# Patient Record
Sex: Male | Born: 1985
Health system: Southern US, Community
[De-identification: ages and names within clinical notes are randomized; demographics above are authoritative.]

## PROBLEM LIST (undated history)

## (undated) DIAGNOSIS — S069XAA Unspecified intracranial injury with loss of consciousness status unknown, initial encounter: Secondary | ICD-10-CM

## (undated) DIAGNOSIS — G4700 Insomnia, unspecified: Secondary | ICD-10-CM

## (undated) DIAGNOSIS — F329 Major depressive disorder, single episode, unspecified: Secondary | ICD-10-CM

## (undated) DIAGNOSIS — K7030 Alcoholic cirrhosis of liver without ascites: Secondary | ICD-10-CM

## (undated) DIAGNOSIS — F32A Depression, unspecified: Secondary | ICD-10-CM

## (undated) DIAGNOSIS — F410 Panic disorder [episodic paroxysmal anxiety] without agoraphobia: Secondary | ICD-10-CM

## (undated) DIAGNOSIS — S069X9A Unspecified intracranial injury with loss of consciousness of unspecified duration, initial encounter: Secondary | ICD-10-CM

## (undated) HISTORY — PX: WRIST SURGERY: SHX841

## (undated) HISTORY — PX: ABDOMINAL SURGERY: SHX537

## (undated) HISTORY — PX: DISTAL FEMUR BONE BRIDGE EXCISION: SHX1465

## (undated) HISTORY — PX: FOREARM SURGERY: SHX651

---
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
## 2004-09-27 ENCOUNTER — Emergency Department (HOSPITAL_COMMUNITY): Admission: EM | Admit: 2004-09-27 | Discharge: 2004-09-27 | Payer: Self-pay | Admitting: Emergency Medicine

## 2004-10-17 ENCOUNTER — Emergency Department (HOSPITAL_COMMUNITY): Admission: EM | Admit: 2004-10-17 | Discharge: 2004-10-17 | Payer: Self-pay | Admitting: *Deleted

## 2014-04-03 ENCOUNTER — Encounter (HOSPITAL_COMMUNITY): Payer: Self-pay | Admitting: Emergency Medicine

## 2014-04-03 ENCOUNTER — Emergency Department (HOSPITAL_COMMUNITY)
Admission: EM | Admit: 2014-04-03 | Discharge: 2014-04-03 | Disposition: A | Discharge status: Home / Self Care | Payer: Self-pay | Attending: Emergency Medicine | Admitting: Emergency Medicine

## 2014-04-03 DIAGNOSIS — R05 Cough: Secondary | ICD-10-CM | POA: Insufficient documentation

## 2014-04-03 DIAGNOSIS — Z8672 Personal history of thrombophlebitis: Secondary | ICD-10-CM | POA: Insufficient documentation

## 2014-04-03 DIAGNOSIS — L089 Local infection of the skin and subcutaneous tissue, unspecified: Secondary | ICD-10-CM | POA: Insufficient documentation

## 2014-04-03 DIAGNOSIS — M79609 Pain in unspecified limb: Secondary | ICD-10-CM | POA: Insufficient documentation

## 2014-04-03 DIAGNOSIS — R6889 Other general symptoms and signs: Secondary | ICD-10-CM | POA: Insufficient documentation

## 2014-04-03 DIAGNOSIS — R059 Cough, unspecified: Secondary | ICD-10-CM | POA: Insufficient documentation

## 2014-04-03 DIAGNOSIS — IMO0001 Reserved for inherently not codable concepts without codable children: Secondary | ICD-10-CM | POA: Insufficient documentation

## 2014-04-03 DIAGNOSIS — Z8659 Personal history of other mental and behavioral disorders: Secondary | ICD-10-CM | POA: Insufficient documentation

## 2014-04-03 DIAGNOSIS — J3489 Other specified disorders of nose and nasal sinuses: Secondary | ICD-10-CM | POA: Insufficient documentation

## 2014-04-03 DIAGNOSIS — F17200 Nicotine dependence, unspecified, uncomplicated: Secondary | ICD-10-CM | POA: Insufficient documentation

## 2014-04-03 HISTORY — DX: Unspecified intracranial injury with loss of consciousness status unknown, initial encounter: S06.9XAA

## 2014-04-03 HISTORY — DX: Unspecified intracranial injury with loss of consciousness of unspecified duration, initial encounter: S06.9X9A

## 2014-04-03 HISTORY — DX: Insomnia, unspecified: G47.00

## 2014-04-03 HISTORY — DX: Depression, unspecified: F32.A

## 2014-04-03 HISTORY — DX: Panic disorder (episodic paroxysmal anxiety): F41.0

## 2014-04-03 HISTORY — DX: Major depressive disorder, single episode, unspecified: F32.9

## 2014-04-03 LAB — CBC WITH DIFFERENTIAL/PLATELET
Basophils Absolute: 0 10*3/uL (ref 0.0–0.1)
Basophils Relative: 1 % (ref 0–1)
Eosinophils Absolute: 0.2 10*3/uL (ref 0.0–0.7)
Eosinophils Relative: 3 % (ref 0–5)
HCT: 39.9 % (ref 39.0–52.0)
Hemoglobin: 14 g/dL (ref 13.0–17.0)
Lymphocytes Relative: 42 % (ref 12–46)
Lymphs Abs: 3.3 10*3/uL (ref 0.7–4.0)
MCH: 33.7 pg (ref 26.0–34.0)
MCHC: 35.1 g/dL (ref 30.0–36.0)
MCV: 95.9 fL (ref 78.0–100.0)
Monocytes Absolute: 0.4 10*3/uL (ref 0.1–1.0)
Monocytes Relative: 5 % (ref 3–12)
Neutro Abs: 4 10*3/uL (ref 1.7–7.7)
Neutrophils Relative %: 51 % (ref 43–77)
Platelets: 257 10*3/uL (ref 150–400)
RBC: 4.16 MIL/uL — ABNORMAL LOW (ref 4.22–5.81)
RDW: 13.1 % (ref 11.5–15.5)
WBC: 7.8 10*3/uL (ref 4.0–10.5)

## 2014-04-03 MED ORDER — HYDROCODONE-ACETAMINOPHEN 5-325 MG PO TABS: 1.0000 | ORAL_TABLET | ORAL | Status: DC | PRN

## 2014-04-03 MED ORDER — SULFAMETHOXAZOLE-TRIMETHOPRIM 800-160 MG PO TABS: 1.0000 | ORAL_TABLET | Freq: Two times a day (BID) | ORAL | Status: AC

## 2014-04-03 NOTE — ED Provider Notes (Signed)
CSN: [id number]     Arrival date & time 04/03/14  1653 History   First MD Initiated Contact with Patient 04/03/14 1704     Chief Complaint  Patient presents with  . Insect Bite     (Consider location/radiation/quality/duration/timing/severity/associated sxs/prior Treatment) Patient is a 28 y.o. male presenting with wound check. The history is provided by the patient.  Wound Check This is a chronic problem. The current episode started more than 1 month ago. The problem occurs constantly. The problem has been gradually worsening. Associated symptoms include congestion, coughing and myalgias. Pertinent negatives include no chills, fever, headaches, joint swelling, nausea, sore throat, swollen glands or vomiting. Abdominal pain: chronic. Nothing aggravates the symptoms.  Ante Carmon Sails Larkin is a 28 y.o. male who presents to the ED with areas on his right upper thigh that started 4 months ago. He states that he went to [facility] in [place] initially when he thought it was a spider bite. The wound was open and he had been trying OTC medication x a week before he went and it was just getting worse. Since the wound was open and they put a packing in and gave him Rx for Doxycycline for 5 days and it cost $70.00 and made him sick but he took it. The wound got some better but now is getting worse again and there are other places that are beginning to look similar to the first one. He denies fever or chills.   Past Medical History  Diagnosis Date  . Insomnia   . Panic attack   . Depression   . Brain injury    Past Surgical History  Procedure Laterality Date  . Forearm surgery Left     x4-metal rods placed  . Distal femur bone bridge excision Left   . Abdominal surgery    . Wrist surgery Right     pins   History reviewed. No pertinent family history. History  Substance Use Topics  . Smoking status: Current Every Day Smoker -- 0.50 packs/day for 14 years    Types: Cigarettes  . Smokeless tobacco:  Never Used  . Alcohol Use: 4.2 oz/week    7 Cans of beer per week    Review of Systems  Constitutional: Negative for fever and chills.  HENT: Positive for congestion and sneezing. Negative for sore throat.   Eyes: Negative for visual disturbance.  Respiratory: Positive for cough.   Gastrointestinal: Negative for nausea and vomiting. Abdominal pain: chronic.  Genitourinary: Negative for dysuria, urgency and frequency.  Musculoskeletal: Positive for myalgias. Negative for back pain and joint swelling.  Neurological: Negative for syncope and headaches.      Allergies  Review of patient's allergies indicates no known allergies.  Home Medications   Prior to Admission medications   Not on File   BP 127/83  Pulse 73  Temp(Src) 98.1 F (36.7 C) (Oral)  Resp 20  Ht 5\' 1"  (1.549 m)  Wt 110 lb (49.896 kg)  BMI 20.80 kg/m2  SpO2 100% Physical Exam  Nursing note and vitals reviewed. Constitutional: He is oriented to person, place, and time. He appears well-developed and well-nourished.  HENT:  Head: Normocephalic and atraumatic.  Eyes: Conjunctivae and EOM are normal.  Neck: Neck supple.  Cardiovascular: Normal rate and regular rhythm.   Pulmonary/Chest: Effort normal and breath sounds normal.  Musculoskeletal: Normal range of motion.       Right upper leg: He exhibits tenderness.       Legs: Open wounds, largest  3 cm x 2 cm.   Neurological: He is alert and oriented to person, place, and time. No cranial nerve deficit.  Skin: Skin is warm and dry.  Psychiatric: He has a normal mood and affect. His behavior is normal.    ED Course  Procedures  Dr. Jeraldine LootsLockwood in to examine the patient.  MDM  Will treat with antibiotics and pain medication. Stable for discharge without fever or signs of sepsis. Discussed with the patient clinical findings and plan of care. All questioned fully answered. He will return if any problems arise.    Medication List    TAKE these medications         HYDROcodone-acetaminophen 5-325 MG per tablet  Commonly known as:  NORCO/VICODIN  Take 1 tablet by mouth every 4 (four) hours as needed.     sulfamethoxazole-trimethoprim 800-160 MG per tablet  Commonly known as:  BACTRIM DS,SEPTRA DS  Take 1 tablet by mouth 2 (two) times daily.      ASK your doctor about these medications       ALEVE 220 MG tablet  Generic drug:  naproxen sodium  Take 220 mg by mouth daily as needed (for pain).     ALPRAZolam 1 MG tablet  Commonly known as:  XANAX  Take 1 mg by mouth 3 (three) times daily as needed for anxiety.     BC HEADACHE 325-95-16 MG Tabs  Generic drug:  Aspirin-Salicylamide-Caffeine  Take 1 packet by mouth daily as needed (for pain).     DULoxetine 60 MG capsule  Commonly known as:  CYMBALTA  Take 60 mg by mouth daily.     ibuprofen 200 MG tablet  Commonly known as:  ADVIL,MOTRIN  Take 200 mg by mouth every 6 (six) hours as needed.           [address]Hope [place] Neese, [place] 04/04/14 [phone]

## 2014-04-03 NOTE — ED Notes (Signed)
Patient treated for possible spider bite on right upper thigh x4 months ago at [facility]. Per patient never saw spider. Patient given antibiotics and wound packed. Now patient reports wound will not heal and now more areas on right upper leg are appearing.

## 2014-04-03 NOTE — ED Notes (Signed)
Pt presents with scab-like and abscess-like area to right anterior thigh. Pt states he was tx for possible spider bite 3-4 months ago in same area. Pt states area "never healed right". Pt reports pain.

## 2014-04-03 NOTE — Discharge Instructions (Signed)
Follow up with a primary care doctor to discuss your ongoing health. Return here for worsening symptoms.

## 2014-04-04 NOTE — ED Provider Notes (Signed)
Medical screening examination/treatment/procedure(s) were conducted as a shared visit with non-physician practitioner(s) and myself.  I personally evaluated the patient during the encounter. On my exam this patient was in no distress.  With concern for cutaneous MRSA infection, but with no e/o bacteremia / sepsis, the patient was started on ABX, d/c in stable condition.  Gerhard Munchobert Dequante Tremaine, MD 04/04/14 1515

## 2014-07-03 ENCOUNTER — Emergency Department (HOSPITAL_COMMUNITY): Payer: Self-pay

## 2014-07-03 ENCOUNTER — Encounter (HOSPITAL_COMMUNITY): Payer: Self-pay | Admitting: Emergency Medicine

## 2014-07-03 ENCOUNTER — Emergency Department (HOSPITAL_COMMUNITY)
Admission: EM | Admit: 2014-07-03 | Discharge: 2014-07-04 | Disposition: A | Discharge status: Home / Self Care | Payer: Self-pay | Attending: Emergency Medicine | Admitting: Emergency Medicine

## 2014-07-03 DIAGNOSIS — S6990XA Unspecified injury of unspecified wrist, hand and finger(s), initial encounter: Secondary | ICD-10-CM

## 2014-07-03 DIAGNOSIS — F329 Major depressive disorder, single episode, unspecified: Secondary | ICD-10-CM | POA: Insufficient documentation

## 2014-07-03 DIAGNOSIS — K047 Periapical abscess without sinus: Secondary | ICD-10-CM | POA: Insufficient documentation

## 2014-07-03 DIAGNOSIS — F17200 Nicotine dependence, unspecified, uncomplicated: Secondary | ICD-10-CM | POA: Insufficient documentation

## 2014-07-03 DIAGNOSIS — Z9889 Other specified postprocedural states: Secondary | ICD-10-CM | POA: Insufficient documentation

## 2014-07-03 DIAGNOSIS — Z87820 Personal history of traumatic brain injury: Secondary | ICD-10-CM | POA: Insufficient documentation

## 2014-07-03 DIAGNOSIS — F3289 Other specified depressive episodes: Secondary | ICD-10-CM | POA: Insufficient documentation

## 2014-07-03 DIAGNOSIS — F410 Panic disorder [episodic paroxysmal anxiety] without agoraphobia: Secondary | ICD-10-CM | POA: Insufficient documentation

## 2014-07-03 DIAGNOSIS — S0993XA Unspecified injury of face, initial encounter: Secondary | ICD-10-CM

## 2014-07-03 DIAGNOSIS — Z79899 Other long term (current) drug therapy: Secondary | ICD-10-CM | POA: Insufficient documentation

## 2014-07-03 DIAGNOSIS — Z791 Long term (current) use of non-steroidal anti-inflammatories (NSAID): Secondary | ICD-10-CM | POA: Insufficient documentation

## 2014-07-03 DIAGNOSIS — S59919A Unspecified injury of unspecified forearm, initial encounter: Secondary | ICD-10-CM

## 2014-07-03 DIAGNOSIS — S025XXA Fracture of tooth (traumatic), initial encounter for closed fracture: Secondary | ICD-10-CM | POA: Insufficient documentation

## 2014-07-03 DIAGNOSIS — S59909A Unspecified injury of unspecified elbow, initial encounter: Secondary | ICD-10-CM | POA: Insufficient documentation
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
## 2014-07-04 MED ORDER — HYDROCODONE-ACETAMINOPHEN 5-325 MG PO TABS: 1.0000 | ORAL_TABLET | ORAL | Status: DC | PRN

## 2014-07-04 MED ORDER — HYDROCODONE-ACETAMINOPHEN 5-325 MG PO TABS
1.0000 | ORAL_TABLET | Freq: Once | ORAL | Status: AC
  Administered 2014-07-04: 1 via ORAL
  Filled 2014-07-04: qty 1

## 2014-07-04 NOTE — Discharge Instructions (Signed)
Dental Injury Your exam shows that you have injured your teeth. The treatment of broken teeth and other dental injuries depends on how badly they are hurt. All dental injuries should be checked as soon as possible by a dentist if there are:  Loose teeth which may need to be wired or bonded with a plastic device to hold them in place.  Broken teeth with exposed tooth pulp which may cause a serious infection.  Painful teeth especially when you bite or chew.  Sharp tooth edges that cut your tongue or lips. Sometimes, antibiotics or pain medicine are prescribed to prevent infection and control pain. Eat a soft or liquid diet and rinse your mouth out after meals with warm water. You should see a dentist or return here at once if you have increased swelling, increased pain or uncontrolled bleeding from the site of your injury. SEEK MEDICAL CARE IF:   You have increased pain not controlled with medicines.  You have swelling around your tooth, in your face or neck.  You have bleeding which starts, continues, or gets worse.  You have a fever. Document Released: 12/03/2005 Document Revised: 02/25/2012 Document Reviewed: 12/02/2009 [facility] Patient Information 2015 [employer] [place]. This information is not intended to replace advice given to you by your health care provider. Make sure you discuss any questions you have with your health care provider.   You  may use the hydrocodone for pain relief but do not drive within 4 hours of taking as this will make you drowsy.  Avoid applying heat or ice to this abscess area which can worsen your symptoms.  You may use  half peroxide and water swish and spit after meals to keep your injured sites clean as discussed.  Call the dentist listed above for further management of your symptoms.

## 2014-07-04 NOTE — ED Provider Notes (Signed)
Medical screening examination/treatment/procedure(s) were performed by non-physician practitioner and as supervising physician I was immediately available for consultation/collaboration.   EKG Interpretation None        Gilda Creasehristopher J. Meranda Dechaine, MD 07/04/14 [phone]
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
## 2017-10-06 ENCOUNTER — Emergency Department (HOSPITAL_COMMUNITY)
Admission: EM | Admit: 2017-10-06 | Discharge: 2017-10-06 | Disposition: A | Discharge status: Home / Self Care | Payer: Self-pay | Attending: Emergency Medicine | Admitting: Emergency Medicine

## 2017-10-06 ENCOUNTER — Encounter (HOSPITAL_COMMUNITY): Payer: Self-pay | Admitting: Emergency Medicine

## 2017-10-06 DIAGNOSIS — F17210 Nicotine dependence, cigarettes, uncomplicated: Secondary | ICD-10-CM | POA: Insufficient documentation

## 2017-10-06 DIAGNOSIS — Z79899 Other long term (current) drug therapy: Secondary | ICD-10-CM | POA: Insufficient documentation

## 2017-10-06 DIAGNOSIS — K047 Periapical abscess without sinus: Secondary | ICD-10-CM | POA: Insufficient documentation

## 2017-10-06 MED ORDER — DICLOFENAC SODIUM 75 MG PO TBEC: 75.0000 mg | DELAYED_RELEASE_TABLET | Freq: Two times a day (BID) | ORAL | 0 refills | Status: DC

## 2017-10-06 MED ORDER — CLINDAMYCIN HCL 150 MG PO CAPS: 300.0000 mg | ORAL_CAPSULE | Freq: Four times a day (QID) | ORAL | 0 refills | Status: DC

## 2017-10-06 NOTE — Discharge Instructions (Signed)
Warm salt water rinses.  Call one of the dentists listed to arrange a follow-up appt

## 2017-10-06 NOTE — ED Triage Notes (Signed)
Patient c/o right lower dental pain with swelling. Per patient started 8 days ago. Patient taking ibuprofen with no relief, last dose yesterday. Patient states "he believes he had fever yesterday." Patient afebrile today.

## 2017-10-06 NOTE — ED Provider Notes (Signed)
[facility] EMERGENCY DEPARTMENT Provider Note   CSN: [id number] Arrival date & time: 10/06/17  1645     History   Chief Complaint Chief Complaint  Patient presents with  . Dental Pain    HPI Gregory Murphy is a 31 y.o. male.  HPI  Gregory Murphy is a 31 y.o. male who presents to the Emergency Department complaining of right sided dental pain for over one week.  He describes a throbbing pain to his lower teeth that is worse with cold liquids and chewing.  He has been taking ibuprofen without relief.  He noticed swelling to his right face one day ago.  Denies fever, neck pain, difficulty swallowing or breathing.     Past Medical History:  Diagnosis Date  . Brain injury (HCC)   . Depression   . Insomnia   . Panic attack     There are no active problems to display for this patient.   Past Surgical History:  Procedure Laterality Date  . ABDOMINAL SURGERY    . DISTAL FEMUR BONE BRIDGE EXCISION Left   . FOREARM SURGERY Left    x4-metal rods placed  . WRIST SURGERY Right    pins       Home Medications    Prior to Admission medications   Medication Sig Start Date End Date Taking? Authorizing Provider  ALPRAZolam Prudy Feeler(XANAX) 1 MG tablet Take 1 mg by mouth 3 (three) times daily as needed for anxiety.    [provider]  Aspirin-Salicylamide-Caffeine (BC HEADACHE) 325-95-16 MG TABS Take 1 packet by mouth daily as needed (for pain).    [provider]  DULoxetine (CYMBALTA) 60 MG capsule Take 60 mg by mouth daily.    [provider]  HYDROcodone-acetaminophen (NORCO/VICODIN) 5-325 MG per tablet Take 1 tablet by mouth every 4 (four) hours as needed. 04/03/14   Janne NapoleonNeese, Hope M, NP  HYDROcodone-acetaminophen (NORCO/VICODIN) 5-325 MG per tablet Take 1 tablet by mouth every 4 (four) hours as needed. 07/04/14   Burgess AmorIdol, Julie, PA-C  ibuprofen (ADVIL,MOTRIN) 200 MG tablet Take 200 mg by mouth every 6 (six) hours as needed.    [provider]    naproxen sodium (ALEVE) 220 MG tablet Take 220 mg by mouth daily as needed (for pain).    [provider]    Family History History reviewed. No pertinent family history.  Social History Social History  Substance Use Topics  . Smoking status: Current Every Day Smoker    Packs/day: 0.50    Years: 14.00    Types: Cigarettes  . Smokeless tobacco: Never Used  . Alcohol use 4.2 oz/week    7 Cans of beer per week     Allergies   Patient has no known allergies.   Review of Systems Review of Systems  Constitutional: Negative for appetite change and fever.  HENT: Positive for dental problem and facial swelling. Negative for congestion, sore throat and trouble swallowing.   Eyes: Negative for pain and visual disturbance.  Musculoskeletal: Negative for neck pain and neck stiffness.  Neurological: Negative for dizziness, facial asymmetry and headaches.  Hematological: Negative for adenopathy.  All other systems reviewed and are negative.    Physical Exam Updated Vital Signs BP 139/90 (BP Location: Left Arm)   Pulse 81   Temp 98.6 F (37 C) (Oral)   Resp 18   Ht 5\' 1"  (1.549 m)   Wt 49.9 kg (110 lb)   SpO2 98%   BMI 20.78 kg/m  Physical Exam  Constitutional: He is oriented to person, place, and time. He appears well-developed and well-nourished. No distress.  HENT:  Head: Normocephalic and atraumatic.  Right Ear: Tympanic membrane and ear canal normal.  Left Ear: Tympanic membrane and ear canal normal.  Mouth/Throat: Uvula is midline, oropharynx is clear and moist and mucous membranes are normal. No trismus in the jaw. Dental caries present. No dental abscesses or uvula swelling.  tenderness and multiple dental caries of the right lower premolars. Mild localized facial edema.  No fluctuance of the surrounding gingiva. No trismus, or sublingual abnml.    Neck: Normal range of motion. Neck supple.  Cardiovascular: Normal rate, regular rhythm and normal heart  sounds.   No murmur heard. Pulmonary/Chest: Effort normal and breath sounds normal.  Musculoskeletal: Normal range of motion.  Lymphadenopathy:    He has no cervical adenopathy.  Neurological: He is alert and oriented to person, place, and time. He exhibits normal muscle tone. Coordination normal.  Skin: Skin is warm and dry.  Nursing note and vitals reviewed.    ED Treatments / Results  Labs (all labs ordered are listed, but only abnormal results are displayed) Labs Reviewed - No data to display  EKG  EKG Interpretation None       Radiology No results found.  Procedures Procedures (including critical care time)  Medications Ordered in ED Medications - No data to display   Initial Impression / Assessment and Plan / ED Course  I have reviewed the triage vital signs and the nursing notes.  Pertinent labs & imaging results that were available during my care of the patient were reviewed by me and considered in my medical decision making (see chart for details).     Pt is well appeaaring.  Airway patent.  Minimal right lower facial edema without drainable abscess.  No concerning sx's for Ludwig's angina.  Final Clinical Impressions(s) / ED Diagnoses   Final diagnoses:  Dental abscess    New Prescriptions New Prescriptions   No medications on file     Rosey Bath 10/07/17 2125    Mesner, Barbara Cower, MD 10/07/17 2337

## 2019-02-14 ENCOUNTER — Emergency Department (HOSPITAL_COMMUNITY)
Admission: EM | Admit: 2019-02-14 | Discharge: 2019-02-15 | Disposition: A | Discharge status: Home / Self Care | Payer: Self-pay | Attending: Emergency Medicine | Admitting: Emergency Medicine

## 2019-02-14 ENCOUNTER — Emergency Department (HOSPITAL_COMMUNITY): Payer: Self-pay

## 2019-02-14 ENCOUNTER — Other Ambulatory Visit: Payer: Self-pay

## 2019-02-14 ENCOUNTER — Encounter (HOSPITAL_COMMUNITY): Payer: Self-pay | Admitting: Emergency Medicine

## 2019-02-14 DIAGNOSIS — Z79899 Other long term (current) drug therapy: Secondary | ICD-10-CM | POA: Insufficient documentation

## 2019-02-14 DIAGNOSIS — J209 Acute bronchitis, unspecified: Secondary | ICD-10-CM | POA: Insufficient documentation

## 2019-02-14 DIAGNOSIS — F17210 Nicotine dependence, cigarettes, uncomplicated: Secondary | ICD-10-CM | POA: Insufficient documentation

## 2019-02-14 MED ORDER — PREDNISONE 50 MG PO TABS
60.0000 mg | ORAL_TABLET | Freq: Once | ORAL | Status: AC
  Administered 2019-02-14: 60 mg via ORAL
  Filled 2019-02-14: qty 1

## 2019-02-14 MED ORDER — ALBUTEROL SULFATE (2.5 MG/3ML) 0.083% IN NEBU
2.5000 mg | INHALATION_SOLUTION | Freq: Once | RESPIRATORY_TRACT | Status: AC
  Administered 2019-02-14: 2.5 mg via RESPIRATORY_TRACT
  Filled 2019-02-14: qty 3

## 2019-02-14 MED ORDER — ALBUTEROL (5 MG/ML) CONTINUOUS INHALATION SOLN
10.0000 mg/h | INHALATION_SOLUTION | Freq: Once | RESPIRATORY_TRACT | Status: AC
  Administered 2019-02-14: 10 mg/h via RESPIRATORY_TRACT
  Filled 2019-02-14: qty 20

## 2019-02-14 MED ORDER — IPRATROPIUM-ALBUTEROL 0.5-2.5 (3) MG/3ML IN SOLN
3.0000 mL | Freq: Once | RESPIRATORY_TRACT | Status: AC
  Administered 2019-02-14: 3 mL via RESPIRATORY_TRACT
  Filled 2019-02-14: qty 3

## 2019-02-14 NOTE — ED Notes (Signed)
Respiratory made aware of neb.

## 2019-02-14 NOTE — ED Notes (Signed)
Patient transported to X-ray 

## 2019-02-14 NOTE — ED Triage Notes (Signed)
Pt c/o cough, sore throat, chills, nasal and chest congestion x 1 week

## 2019-02-14 NOTE — ED Notes (Signed)
Respiratory notified for ned treatment.

## 2019-02-14 NOTE — ED Provider Notes (Signed)
[facility] EMERGENCY DEPARTMENT Provider Note   CSN: [id number] Arrival date & time: 02/14/19  2036    History   Chief Complaint Chief Complaint  Patient presents with  . Cough    HPI Gregory Murphy is a 33 y.o. male presenting with a one week history of cold like symptoms, first described as nasal congestion and rhinorrhea which has progressed to cough productive of thick green sputum production, shortness of breath and wheezing.  He denies fevers or chills but does endorse episodic increased sweating.  He has had no treatments prior to arrival.  He is a smoker but reports has smoked very little this week.  He denies hemoptysis, weight loss, n/v, abdominal pain but does feel sore from all the coughing.  He has found no alleviators for his symptoms.     The history is provided by the patient.    Past Medical History:  Diagnosis Date  . Brain injury (HCC)   . Depression   . Insomnia   . Panic attack     There are no active problems to display for this patient.   Past Surgical History:  Procedure Laterality Date  . ABDOMINAL SURGERY    . DISTAL FEMUR BONE BRIDGE EXCISION Left   . FOREARM SURGERY Left    x4-metal rods placed  . WRIST SURGERY Right    pins        Home Medications    Prior to Admission medications   Medication Sig Start Date End Date Taking? Authorizing Provider  acetaminophen (TYLENOL) 500 MG tablet Take 500 mg by mouth every 6 (six) hours as needed for mild pain or moderate pain.   Yes [provider]  albuterol (PROVENTIL) (2.5 MG/3ML) 0.083% nebulizer solution Take 2.5 mg by nebulization every 6 (six) hours as needed for wheezing or shortness of breath.   Yes [provider]  benzonatate (TESSALON) 100 MG capsule Take 2 capsules (200 mg total) by mouth 3 (three) times daily as needed. 02/15/19   Burgess Amor, PA-C  predniSONE (DELTASONE) 50 MG tablet Take one tablet daily for 5 days 02/15/19   Burgess Amor, PA-C    Family  History History reviewed. No pertinent family history.  Social History Social History   Tobacco Use  . Smoking status: Current Every Day Smoker    Packs/day: 0.50    Years: 14.00    Pack years: 7.00    Types: Cigarettes  . Smokeless tobacco: Never Used  Substance Use Topics  . Alcohol use: Yes    Alcohol/week: 7.0 standard drinks    Types: 7 Cans of beer per week  . Drug use: No     Allergies   Patient has no known allergies.   Review of Systems Review of Systems  Constitutional: Positive for diaphoresis. Negative for chills and fever.  HENT: Positive for congestion and rhinorrhea. Negative for sore throat.   Eyes: Negative.   Respiratory: Positive for chest tightness, shortness of breath and wheezing.   Cardiovascular: Negative for chest pain.  Gastrointestinal: Negative for abdominal pain, nausea and vomiting.  Genitourinary: Negative.   Musculoskeletal: Negative for arthralgias, joint swelling and neck pain.  Skin: Negative.  Negative for rash and wound.  Neurological: Negative for dizziness, weakness, light-headedness, numbness and headaches.  Psychiatric/Behavioral: Negative.      Physical Exam Updated Vital Signs BP 134/69   Pulse 84   Temp 97.8 F (36.6 C) (Oral)   Resp 18   Ht 5\' 1"  (1.549 m)  Wt 49.9 kg   SpO2 99%   BMI 20.78 kg/m   Physical Exam Vitals signs and nursing note reviewed.  Constitutional:      Appearance: He is well-developed.  HENT:     Head: Normocephalic and atraumatic.     Nose: Congestion and rhinorrhea present.  Eyes:     Conjunctiva/sclera: Conjunctivae normal.  Neck:     Musculoskeletal: Normal range of motion.  Cardiovascular:     Rate and Rhythm: Regular rhythm. Tachycardia present.     Heart sounds: Normal heart sounds.  Pulmonary:     Effort: Respiratory distress present.     Breath sounds: Decreased air movement present. Decreased breath sounds and wheezing present. No rhonchi or rales.     Comments: Prolonged  expirations Abdominal:     General: Bowel sounds are normal.     Palpations: Abdomen is soft.     Tenderness: There is no abdominal tenderness.  Musculoskeletal: Normal range of motion.  Skin:    General: Skin is warm and dry.  Neurological:     Mental Status: He is alert.      ED Treatments / Results  Labs (all labs ordered are listed, but only abnormal results are displayed) Labs Reviewed - No data to display  EKG None  Radiology Dg Chest 2 View  Result Date: 02/14/2019 CLINICAL DATA:  Cough, SOB and sore throat. EXAM: CHEST - 2 VIEW COMPARISON:  None. FINDINGS: Heart size appears within normal limits. No pleural effusion or edema identified. Diffuse bronchial wall thickening identified bilaterally. No airspace consolidation. IMPRESSION: 1. Bronchitic changes identified bilaterally.  No pneumonia Electronically Signed   By: Signa Kell M.D.   On: 02/14/2019 21:43    Procedures Procedures (including critical care time)  Medications Ordered in ED Medications  albuterol (PROVENTIL HFA;VENTOLIN HFA) 108 (90 Base) MCG/ACT inhaler 2 puff (2 puffs Inhalation Given 02/15/19 0015)  predniSONE (DELTASONE) tablet 60 mg (60 mg Oral Given 02/14/19 2117)  ipratropium-albuterol (DUONEB) 0.5-2.5 (3) MG/3ML nebulizer solution 3 mL (3 mLs Nebulization Given 02/14/19 2135)  albuterol (PROVENTIL) (2.5 MG/3ML) 0.083% nebulizer solution 2.5 mg (2.5 mg Nebulization Given 02/14/19 2135)  albuterol (PROVENTIL,VENTOLIN) solution continuous neb (10 mg/hr Nebulization Given 02/14/19 2256)  benzonatate (TESSALON) capsule 200 mg (200 mg Oral Given 02/15/19 0015)     Initial Impression / Assessment and Plan / ED Course  I have reviewed the triage vital signs and the nursing notes.  Pertinent labs & imaging results that were available during my care of the patient were reviewed by me and considered in my medical decision making (see chart for details).        Patient with acute bronchitis without  pneumonia.  He was given albuterol and Atrovent neb treatment in addition to p.o. prednisone.  He continued to have significant expiratory wheeze and shortness of breath.  He was then given an hour-long nebulizer treatment after which time his wheezing had greatly improved along with increased aeration.  He felt much more comfortable with his breathing.  He was given an albuterol MDI for home use.  Also pulse dosing of prednisone, Tessalon prescribed for his cough.  Discussed smoking cessation and information regarding smoking complications was given.  PRN follow-up anticipated.  Return precautions were outlined.  Final Clinical Impressions(s) / ED Diagnoses   Final diagnoses:  Acute bronchitis, unspecified organism    ED Discharge Orders         Ordered    predniSONE (DELTASONE) 50 MG tablet  02/15/19 0006    benzonatate (TESSALON) 100 MG capsule  3 times daily PRN     02/15/19 0008           Burgess Amor, PA-C 02/15/19 0130    Loren Racer, MD 02/15/19 [phone]

## 2019-02-15 MED ORDER — BENZONATATE 100 MG PO CAPS: 200.0000 mg | ORAL_CAPSULE | Freq: Three times a day (TID) | ORAL | 0 refills | Status: DC | PRN

## 2019-02-15 MED ORDER — PREDNISONE 50 MG PO TABS: ORAL_TABLET | ORAL | 0 refills | Status: DC

## 2019-02-15 MED ORDER — ALBUTEROL SULFATE HFA 108 (90 BASE) MCG/ACT IN AERS
2.0000 | INHALATION_SPRAY | RESPIRATORY_TRACT | Status: DC
  Administered 2019-02-15: 2 via RESPIRATORY_TRACT
  Filled 2019-02-15: qty 6.7

## 2019-02-15 MED ORDER — BENZONATATE 100 MG PO CAPS
200.0000 mg | ORAL_CAPSULE | Freq: Once | ORAL | Status: AC
  Administered 2019-02-15: 200 mg via ORAL
  Filled 2019-02-15: qty 2

## 2019-02-15 NOTE — Discharge Instructions (Signed)
Use the inhaler given taking 2 puffs every 4 hours if you are coughing or wheezing.  Take your next dose of prednisone Sunday evening and every day for 5 days until gone.  Get rechecked for any worsened symptoms. Consider smoking cessation.

## 2019-06-18 ENCOUNTER — Other Ambulatory Visit: Payer: Self-pay

## 2019-06-18 DIAGNOSIS — Z20822 Contact with and (suspected) exposure to covid-19: Secondary | ICD-10-CM

## 2019-06-24 LAB — NOVEL CORONAVIRUS, NAA: SARS-CoV-2, NAA: NOT DETECTED

## 2019-08-06 ENCOUNTER — Encounter (HOSPITAL_COMMUNITY): Payer: Self-pay | Admitting: Emergency Medicine

## 2019-08-06 ENCOUNTER — Emergency Department (HOSPITAL_COMMUNITY)
Admission: EM | Admit: 2019-08-06 | Discharge: 2019-08-06 | Disposition: A | Discharge status: Home / Self Care | Payer: Self-pay | Attending: Emergency Medicine | Admitting: Emergency Medicine

## 2019-08-06 ENCOUNTER — Other Ambulatory Visit: Payer: Self-pay

## 2019-08-06 ENCOUNTER — Emergency Department (HOSPITAL_COMMUNITY): Payer: Self-pay

## 2019-08-06 DIAGNOSIS — J40 Bronchitis, not specified as acute or chronic: Secondary | ICD-10-CM | POA: Insufficient documentation

## 2019-08-06 DIAGNOSIS — Z0279 Encounter for issue of other medical certificate: Secondary | ICD-10-CM | POA: Insufficient documentation

## 2019-08-06 DIAGNOSIS — T675XXA Heat exhaustion, unspecified, initial encounter: Secondary | ICD-10-CM | POA: Insufficient documentation

## 2019-08-06 DIAGNOSIS — F17210 Nicotine dependence, cigarettes, uncomplicated: Secondary | ICD-10-CM | POA: Insufficient documentation

## 2019-08-06 LAB — BASIC METABOLIC PANEL
Anion gap: 11 (ref 5–15)
BUN: 9 mg/dL (ref 6–20)
CO2: 26 mmol/L (ref 22–32)
Calcium: 9.1 mg/dL (ref 8.9–10.3)
Chloride: 99 mmol/L (ref 98–111)
Creatinine, Ser: 0.63 mg/dL (ref 0.61–1.24)
GFR calc Af Amer: >60 mL/min (ref >60)
GFR calc non Af Amer: >60 mL/min (ref >60)
Glucose, Bld: 86 mg/dL (ref 70–99)
Potassium: 4.2 mmol/L (ref 3.5–5.1)
Sodium: 136 mmol/L (ref 135–145)

## 2019-08-06 MED ORDER — ALBUTEROL SULFATE HFA 108 (90 BASE) MCG/ACT IN AERS: 2.0000 | INHALATION_SPRAY | Freq: Once | RESPIRATORY_TRACT | Status: DC

## 2019-08-06 NOTE — Discharge Instructions (Addendum)
Your vital signs are within normal limits.  Your oxygen level is 98% on room air.  Your chemistries did not show any signs of dehydration or renal injury.  Your chest x-ray is negative for pneumonia or other acute problems.  Please continue to increase fluids.  Please use caution when in extreme heat not to be overheated.  See your primary physician or return to the emergency department if any changes in your condition, problems or concerns.

## 2019-08-06 NOTE — ED Provider Notes (Signed)
[facility] EMERGENCY DEPARTMENT Provider Note   CSN: [id number] Arrival date & time: 08/06/19  1300     History   Chief Complaint Chief Complaint  Patient presents with  . Needs work note    HPI Gregory Murphy is a 33 y.o. male.     Patient is a 33 year old male who presents to the emergency department for recheck, and to obtain a work note.  The patient states that on yesterday August 19, he got overheated, he started having some shaking, and then he had a vomiting episode.  He says that he drank a lot of liquids and he thinks that drinking liquids on a hot stomach may have been what made him sick.  He says that his work is requiring him to have a work note before he can return to work.  The patient states that he is already had COVID testing and was found to be negative for COVID.  No high fever reported.  No unusual headache, no difficulty with taste or smell, no recent exposure to someone known to have the COVID-19 virus.  The history is provided by the patient.    Past Medical History:  Diagnosis Date  . Brain injury ([place])   . Depression   . Insomnia   . Panic attack     There are no active problems to display for this patient.   Past Surgical History:  Procedure Laterality Date  . ABDOMINAL SURGERY    . DISTAL FEMUR BONE BRIDGE EXCISION Left   . FOREARM SURGERY Left    x4-metal rods placed  . WRIST SURGERY Right    pins        Home Medications    Prior to Admission medications   Medication Sig Start Date End Date Taking? Authorizing Provider  acetaminophen (TYLENOL) 500 MG tablet Take 500 mg by mouth every 6 (six) hours as needed for mild pain or moderate pain.    [provider]  albuterol (PROVENTIL) (2.5 MG/3ML) 0.083% nebulizer solution Take 2.5 mg by nebulization every 6 (six) hours as needed for wheezing or shortness of breath.    [provider]  benzonatate (TESSALON) 100 MG capsule Take 2 capsules (200 mg total) by mouth 3  (three) times daily as needed. 02/15/19   [name], PA-C  predniSONE (DELTASONE) 50 MG tablet Take one tablet daily for 5 days 02/15/19   [name], PA-C    Family History History reviewed. No pertinent family history.  Social History Social History   Tobacco Use  . Smoking status: Current Every Day Smoker    Packs/day: 0.50    Years: 14.00    Pack years: 7.00    Types: Cigarettes  . Smokeless tobacco: Never Used  Substance Use Topics  . Alcohol use: Yes    Alcohol/week: 7.0 standard drinks    Types: 7 Cans of beer per week  . Drug use: No     Allergies   Patient has no known allergies.   Review of Systems Review of Systems  Constitutional: Negative for activity change and appetite change.  HENT: Negative for congestion, ear discharge, ear pain, facial swelling, nosebleeds, rhinorrhea, sneezing and tinnitus.   Eyes: Negative for photophobia, pain and discharge.  Respiratory: Positive for cough and wheezing. Negative for choking and shortness of breath.   Cardiovascular: Negative for chest pain, palpitations and leg swelling.  Gastrointestinal: Positive for nausea and vomiting. Negative for abdominal pain, blood in stool, constipation and diarrhea.  Genitourinary: Negative for  difficulty urinating, dysuria, flank pain, frequency and hematuria.  Musculoskeletal: Negative for back pain, gait problem, myalgias and neck pain.  Skin: Negative for color change, rash and wound.  Neurological: Negative for dizziness, seizures, syncope, facial asymmetry, speech difficulty, weakness and numbness.  Hematological: Negative for adenopathy. Does not bruise/bleed easily.  Psychiatric/Behavioral: Negative for agitation, confusion, hallucinations, self-injury and suicidal ideas. The patient is not nervous/anxious.      Physical Exam Updated Vital Signs BP 127/82 (BP Location: Right Arm)   Pulse 60   Temp 98.4 F (36.9 C) (Oral)   Resp 16   Ht 5\' 1"  (1.549 m)   Wt 49.9 kg   SpO2  98%   BMI 20.78 kg/m   Physical Exam Vitals signs and nursing note reviewed.  Constitutional:      Appearance: He is well-developed. He is not toxic-appearing.  HENT:     Head: Normocephalic.     Right Ear: Tympanic membrane and external ear normal.     Left Ear: Tympanic membrane and external ear normal.  Eyes:     General: Lids are normal.     Pupils: Pupils are equal, round, and reactive to light.  Neck:     Musculoskeletal: Normal range of motion and neck supple.     Vascular: No carotid bruit.  Cardiovascular:     Rate and Rhythm: Normal rate and regular rhythm.     Pulses: Normal pulses.     Heart sounds: Normal heart sounds.  Pulmonary:     Effort: No respiratory distress.     Breath sounds: Wheezing and rhonchi present.  Abdominal:     General: Bowel sounds are normal.     Palpations: Abdomen is soft.     Tenderness: There is no abdominal tenderness. There is no guarding.  Musculoskeletal: Normal range of motion.  Lymphadenopathy:     Head:     Right side of head: No submandibular adenopathy.     Left side of head: No submandibular adenopathy.     Cervical: No cervical adenopathy.  Skin:    General: Skin is warm and dry.  Neurological:     Mental Status: He is alert and oriented to person, place, and time.     Cranial Nerves: No cranial nerve deficit.     Sensory: No sensory deficit.  Psychiatric:        Speech: Speech normal.      ED Treatments / Results  Labs (all labs ordered are listed, but only abnormal results are displayed) Labs Reviewed  BASIC METABOLIC PANEL    EKG None  Radiology No results found.  Procedures Procedures (including critical care time)  Medications Ordered in ED Medications - No data to display   Initial Impression / Assessment and Plan / ED Course  I have reviewed the triage vital signs and the nursing notes.  Pertinent labs & imaging results that were available during my care of the patient were reviewed by me  and considered in my medical decision making (see chart for details).          Final Clinical Impressions(s) / ED Diagnoses MDM  Vital signs are within normal limits.  Pulse oximetry is 98% on room air.  Within normal limits by my interpretation.  Patient had a negative COVID-19 test about 10 days ago.  Patient had wheezes and a few scattered rhonchi on the examination.  Chest x-ray obtained.  No acute findings noted.  Patient will be treated with albuterol 2 puffs every 4  hours.  The basic metabolic panel is negative for any acute changes.  The patient is drinking liquids and able to keep them down without any problem.  Work note given for the patient to return to work on tomorrow, August 21.   Final diagnoses:  Heat exhaustion, initial encounter  Bronchitis    ED Discharge Orders    None       Ivery QualeBryant, Margi Edmundson, PA-C 08/06/19 1516    Gerhard MunchLockwood, Robert, MD 08/07/19 [phone]

## 2019-08-06 NOTE — ED Notes (Signed)
Pt refused D/C vitals and inhaler.

## 2019-08-06 NOTE — ED Triage Notes (Signed)
Patient states he needs a note to return to work. Got overheated yesterday and vomited. Has to have note to return to work today. Has negative COVID test from 2 weeks ago.

## 2019-11-01 ENCOUNTER — Other Ambulatory Visit: Payer: Self-pay

## 2019-11-01 ENCOUNTER — Emergency Department (HOSPITAL_COMMUNITY)
Admission: EM | Admit: 2019-11-01 | Discharge: 2019-11-01 | Disposition: A | Discharge status: Home / Self Care | Payer: Self-pay | Attending: Emergency Medicine | Admitting: Emergency Medicine

## 2019-11-01 ENCOUNTER — Encounter (HOSPITAL_COMMUNITY): Payer: Self-pay | Admitting: *Deleted

## 2019-11-01 DIAGNOSIS — Z20828 Contact with and (suspected) exposure to other viral communicable diseases: Secondary | ICD-10-CM | POA: Insufficient documentation

## 2019-11-01 DIAGNOSIS — F17210 Nicotine dependence, cigarettes, uncomplicated: Secondary | ICD-10-CM | POA: Insufficient documentation

## 2019-11-01 DIAGNOSIS — Z20822 Contact with and (suspected) exposure to covid-19: Secondary | ICD-10-CM

## 2019-11-01 LAB — SARS CORONAVIRUS 2 (TAT 6-24 HRS): SARS Coronavirus 2: NEGATIVE

## 2019-11-01 NOTE — ED Notes (Signed)
Pt appears impaired admitting to ingestion of ETOH beverages today   He request a return to work note from his Covid Dx

## 2019-11-01 NOTE — Discharge Instructions (Addendum)
You were tested for COVID, return to work depends on multiple factores.  If you are asymptomatic- we typically recommend quarantine for 14 days from time of exposure: Exposure is defined as Individual who has had close contact (within 6 ff for a total of 15 min or more) with someone who is covid positive. Persons with COVID-19 who have symptoms are instructed to quarantine, can discontinue if:- It has been at least 7 days have passed since symptoms 1st appeared.AND at least 3 days (72 hours) have passed since recovery defined as resolution of fever without the use of fever-reducing medications and improvement in respiratory symptoms (e.g., cough, shortness of breath)  Your return to work date depends on when you were exposed, the results of your test, and symptoms. Please see attached CDC guidelines.   Please follow the below quarantine instructions.   Return to the ER for new or worsening symptoms including but not limited to increased work of breathing, fever, chest pain, passing out, or any other concerns.       Person Under Monitoring Name: Gregory Murphy  Location: [address] [place] [address]   Infection Prevention Recommendations for Individuals Confirmed to have, or Being Evaluated for, 2019 Novel Coronavirus (COVID-19) Infection Who Receive Care at Home  Individuals who are confirmed to have, or are being evaluated for, COVID-19 should follow the prevention steps below until a healthcare provider or local or state health department says they can return to normal activities.  Stay home except to get medical care You should restrict activities outside your home, except for getting medical care. Do not go to work, school, or public areas, and do not use public transportation or taxis.  Call ahead before visiting your doctor Before your medical appointment, call the healthcare provider and tell them that you have, or are being evaluated for, COVID-19 infection. This will  help the healthcare providers office take steps to keep other people from getting infected. Ask your healthcare provider to call the local or state health department.  Monitor your symptoms Seek prompt medical attention if your illness is worsening (e.g., difficulty breathing). Before going to your medical appointment, call the healthcare provider and tell them that you have, or are being evaluated for, COVID-19 infection. Ask your healthcare provider to call the local or state health department.  Wear a facemask You should wear a facemask that covers your nose and mouth when you are in the same room with other people and when you visit a healthcare provider. People who live with or visit you should also wear a facemask while they are in the same room with you.  Separate yourself from other people in your home As much as possible, you should stay in a different room from other people in your home. Also, you should use a separate bathroom, if available.  Avoid sharing household items You should not share dishes, drinking glasses, cups, eating utensils, towels, bedding, or other items with other people in your home. After using these items, you should wash them thoroughly with soap and water.  Cover your coughs and sneezes Cover your mouth and nose with a tissue when you cough or sneeze, or you can cough or sneeze into your sleeve. Throw used tissues in a lined trash can, and immediately wash your hands with soap and water for at least 20 seconds or use an alcohol-based hand rub.  Wash your [employer] your hands often and thoroughly with soap and water for at least 20  seconds. You can use an alcohol-based hand sanitizer if soap and water are not available and if your hands are not visibly dirty. Avoid touching your eyes, nose, and mouth with unwashed hands.   Prevention Steps for Caregivers and Household Members of Individuals Confirmed to have, or Being Evaluated for, COVID-19  Infection Being Cared for in the Home  If you live with, or provide care at home for, a person confirmed to have, or being evaluated for, COVID-19 infection please follow these guidelines to prevent infection:  Follow healthcare providers instructions Make sure that you understand and can help the patient follow any healthcare provider instructions for all care.  Provide for the patients basic needs You should help the patient with basic needs in the home and provide support for getting groceries, prescriptions, and other personal needs.  Monitor the patients symptoms If they are getting sicker, call his or her medical provider and tell them that the patient has, or is being evaluated for, COVID-19 infection. This will help the healthcare providers office take steps to keep other people from getting infected. Ask the healthcare provider to call the local or state health department.  Limit the number of people who have contact with the patient If possible, have only one caregiver for the patient. Other household members should stay in another home or place of residence. If this is not possible, they should stay in another room, or be separated from the patient as much as possible. Use a separate bathroom, if available. Restrict visitors who do not have an essential need to be in the home.  Keep older adults, very young children, and other sick people away from the patient Keep older adults, very young children, and those who have compromised immune systems or chronic health conditions away from the patient. This includes people with chronic heart, lung, or kidney conditions, diabetes, and cancer.  Ensure good ventilation Make sure that shared spaces in the home have good air flow, such as from an air conditioner or an opened window, weather permitting.  Wash your hands often Wash your hands often and thoroughly with soap and water for at least 20 seconds. You can use an alcohol  based hand sanitizer if soap and water are not available and if your hands are not visibly dirty. Avoid touching your eyes, nose, and mouth with unwashed hands. Use disposable paper towels to dry your hands. If not available, use dedicated cloth towels and replace them when they become wet.  Wear a facemask and gloves Wear a disposable facemask at all times in the room and gloves when you touch or have contact with the patients blood, body fluids, and/or secretions or excretions, such as sweat, saliva, sputum, nasal mucus, vomit, urine, or feces.  Ensure the mask fits over your nose and mouth tightly, and do not touch it during use. Throw out disposable facemasks and gloves after using them. Do not reuse. Wash your hands immediately after removing your facemask and gloves. If your personal clothing becomes contaminated, carefully remove clothing and launder. Wash your hands after handling contaminated clothing. Place all used disposable facemasks, gloves, and other waste in a lined container before disposing them with other household waste. Remove gloves and wash your hands immediately after handling these items.  Do not share dishes, glasses, or other household items with the patient Avoid sharing household items. You should not share dishes, drinking glasses, cups, eating utensils, towels, bedding, or other items with a patient who is confirmed to have,  or being evaluated for, COVID-19 infection. After the person uses these items, you should wash them thoroughly with soap and water.  Wash laundry thoroughly Immediately remove and wash clothes or bedding that have blood, body fluids, and/or secretions or excretions, such as sweat, saliva, sputum, nasal mucus, vomit, urine, or feces, on them. Wear gloves when handling laundry from the patient. Read and follow directions on labels of laundry or clothing items and detergent. In general, wash and dry with the warmest temperatures recommended on the  label.  Clean all areas the individual has used often Clean all touchable surfaces, such as counters, tabletops, doorknobs, bathroom fixtures, toilets, phones, keyboards, tablets, and bedside tables, every day. Also, clean any surfaces that may have blood, body fluids, and/or secretions or excretions on them. Wear gloves when cleaning surfaces the patient has come in contact with. Use a diluted bleach solution (e.g., dilute bleach with 1 part bleach and 10 parts water) or a household disinfectant with a label that says EPA-registered for coronaviruses. To make a bleach solution at home, add 1 tablespoon of bleach to 1 quart (4 cups) of water. For a larger supply, add  cup of bleach to 1 gallon (16 cups) of water. Read labels of cleaning products and follow recommendations provided on product labels. Labels contain instructions for safe and effective use of the cleaning product including precautions you should take when applying the product, such as wearing gloves or eye protection and making sure you have good ventilation during use of the product. Remove gloves and wash hands immediately after cleaning.  Monitor yourself for signs and symptoms of illness Caregivers and household members are considered close contacts, should monitor their health, and will be asked to limit movement outside of the home to the extent possible. Follow the monitoring steps for close contacts listed on the symptom monitoring form.   ? If you have additional questions, contact your local health department or call the epidemiologist on call at [phone] (available 24/7). ? This guidance is subject to change. For the most up-to-date guidance from [facility], please refer to their website: [email or web address]

## 2019-11-01 NOTE — ED Triage Notes (Signed)
Pt with positive covid test that was required by work per pt a week ago.  Pt denies any symptoms and needs a work note to be able to return to work.  Denies fever or cough.

## 2019-11-01 NOTE — ED Provider Notes (Signed)
[facility]   CSN: [id number] Arrival date & time: 11/01/19  1518     History   Chief Complaint Chief Complaint  Patient presents with  . work note    HPI Gregory Murphy is a 33 y.o. male with a hx of tobacco abuse, depression, & insomnia who presents to the ED requesting COVID testing per his work. Patient states he had an exposure at work and was told to come to the ED for testing. Patient admitted to being intoxicated and was an overall poor historian therefore he called his boss Gregory Murphy and placed her on speaker phone so that I could obtain more information- patient had a close contact exposure to another individual at work 11/09 who has since tested positive for COVID 19. Work has protocols in place following CDC guidelines. They are requesting he be tested in the ER. Patient states he is asymptomatic. No complaints. No alleviating/aggravating factors. Denies fever, chills, cough, congestion, chest pain, dyspnea, body aches, or V/D. He is not driving home, has a ride.     HPI  Past Medical History:  Diagnosis Date  . Brain injury (HCC)   . Depression   . Insomnia   . Panic attack     There are no active problems to display for this patient.   Past Surgical History:  Procedure Laterality Date  . ABDOMINAL SURGERY    . DISTAL FEMUR BONE BRIDGE EXCISION Left   . FOREARM SURGERY Left    x4-metal rods placed  . WRIST SURGERY Right    pins        Home Medications    Prior to Admission medications   Medication Sig Start Date End Date Taking? Authorizing Provider  acetaminophen (TYLENOL) 500 MG tablet Take 500 mg by mouth every 6 (six) hours as needed for mild pain or moderate pain.    [provider]  albuterol (PROVENTIL) (2.5 MG/3ML) 0.083% nebulizer solution Take 2.5 mg by nebulization every 6 (six) hours as needed for wheezing or shortness of breath.    [provider]  benzonatate (TESSALON) 100 MG capsule Take  2 capsules (200 mg total) by mouth 3 (three) times daily as needed. 02/15/19   Burgess AmorIdol, Julie, PA-C  predniSONE (DELTASONE) 50 MG tablet Take one tablet daily for 5 days 02/15/19   Burgess AmorIdol, Julie, PA-C    Family History History reviewed. No pertinent family history.  Social History Social History   Tobacco Use  . Smoking status: Current Every Day Smoker    Packs/day: 0.50    Years: 14.00    Pack years: 7.00    Types: Cigarettes  . Smokeless tobacco: Never Used  Substance Use Topics  . Alcohol use: Yes    Alcohol/week: 7.0 standard drinks    Types: 7 Cans of beer per week    Comment: admits to drinking today 3-4  . Drug use: No     Allergies   Patient has no known allergies.   Review of Systems Review of Systems  Constitutional: Negative for chills and fever.  HENT: Negative for congestion, ear pain and sore throat.   Respiratory: Negative for cough and shortness of breath.   Cardiovascular: Negative for chest pain, palpitations and leg swelling.  Gastrointestinal: Negative for abdominal pain, diarrhea, nausea and vomiting.  Musculoskeletal: Negative for myalgias.  Neurological: Negative for syncope.     Physical Exam Updated Vital Signs BP (!) 137/91 (BP Location: Right Arm)   Pulse 82   Temp  98.1 F (36.7 C) (Oral)   Ht 5\' 1"  (1.549 m)   Wt 49.9 kg   SpO2 95%   BMI 20.78 kg/m   Physical Exam Vitals signs and nursing note reviewed.  Constitutional:      General: He is not in acute distress.    Appearance: He is well-developed. He is not toxic-appearing.  HENT:     Head: Normocephalic and atraumatic.  Eyes:     General:        Right eye: No discharge.        Left eye: No discharge.     Conjunctiva/sclera: Conjunctivae normal.  Neck:     Musculoskeletal: Neck supple.  Cardiovascular:     Rate and Rhythm: Normal rate and regular rhythm.  Pulmonary:     Effort: Pulmonary effort is normal. No respiratory distress.     Breath sounds: Normal breath sounds. No  wheezing, rhonchi or rales.  Abdominal:     General: There is no distension.     Palpations: Abdomen is soft.     Tenderness: There is no abdominal tenderness.  Skin:    General: Skin is warm and dry.     Findings: No rash.  Neurological:     Mental Status: He is alert.     Comments: Clear speech.   Psychiatric:        Behavior: Behavior normal.      ED Treatments / Results  Labs (all labs ordered are listed, but only abnormal results are displayed) Labs Reviewed  SARS CORONAVIRUS 2 (TAT 6-24 HRS)    EKG None  Radiology No results found.  Procedures Procedures (including critical care time)  Medications Ordered in ED Medications - No data to display   Initial Impression / Assessment and Plan / ED Course  I have reviewed the triage vital signs and the nursing notes.  Pertinent labs & imaging results that were available during my care of the patient were reviewed by me and considered in my medical decision making (see chart for details).   Patient presents to the ED for asymptomatic covid testing due to an exposure at work.  Nontoxic appearing, vitals without significant abnormality, BP elevated- doubt HTN emergency. Had some difficulty provided history and appeared somewhat intoxicated- admitted to EtOH today, not driving home & has safe place to stay. Majority of hx provided by patient's boss via telephone which patient confirmed. Asymptomatic, benign physical exam today, testing obtained, discussed quarantine guidelines s/p exposure per CDC with patient & his boss via telephone. Discussed need to continue quarantine. I discussed plan, need for follow-up, and return precautions with the patient. Provided opportunity for questions, patient confirmed understanding and is in agreement with plan.   Gregory Murphy was evaluated in Emergency Department on 11/01/2019 for the symptoms described in the history of present illness. He/she was evaluated in the context of the global  COVID-19 pandemic, which necessitated consideration that the patient might be at risk for infection with the SARS-CoV-2 virus that causes COVID-19. Institutional protocols and algorithms that pertain to the evaluation of patients at risk for COVID-19 are in a state of rapid change based on information released by regulatory bodies including the CDC and federal and state organizations. These policies and algorithms were followed during the patient's care in the ED.   Final Clinical Impressions(s) / ED Diagnoses   Final diagnoses:  Encounter for laboratory testing for COVID-19 virus    ED Discharge Orders    None  Desmond Lope 11/01/19 1722    Bethann Berkshire, MD 11/01/19 [phone]

## 2019-11-05 ENCOUNTER — Other Ambulatory Visit: Payer: Self-pay

## 2019-11-05 DIAGNOSIS — Z20822 Contact with and (suspected) exposure to covid-19: Secondary | ICD-10-CM

## 2019-11-07 LAB — NOVEL CORONAVIRUS, NAA: SARS-CoV-2, NAA: NOT DETECTED

## 2020-09-30 ENCOUNTER — Emergency Department (HOSPITAL_COMMUNITY)
Admission: EM | Admit: 2020-09-30 | Discharge: 2020-10-01 | Disposition: A | Discharge status: Home / Self Care | Payer: Self-pay | Attending: Emergency Medicine | Admitting: Emergency Medicine

## 2020-09-30 ENCOUNTER — Encounter (HOSPITAL_COMMUNITY): Payer: Self-pay

## 2020-09-30 ENCOUNTER — Other Ambulatory Visit: Payer: Self-pay

## 2020-09-30 DIAGNOSIS — F17210 Nicotine dependence, cigarettes, uncomplicated: Secondary | ICD-10-CM | POA: Insufficient documentation

## 2020-09-30 DIAGNOSIS — L0291 Cutaneous abscess, unspecified: Secondary | ICD-10-CM | POA: Insufficient documentation

## 2020-09-30 LAB — BASIC METABOLIC PANEL
Anion gap: 10 (ref 5–15)
BUN: 7 mg/dL (ref 6–20)
CO2: 29 mmol/L (ref 22–32)
Calcium: 8.9 mg/dL (ref 8.9–10.3)
Chloride: 92 mmol/L — ABNORMAL LOW (ref 98–111)
Creatinine, Ser: 0.61 mg/dL (ref 0.61–1.24)
GFR, Estimated: >60 mL/min (ref >60)
Glucose, Bld: 95 mg/dL (ref 70–99)
Potassium: 3.8 mmol/L (ref 3.5–5.1)
Sodium: 131 mmol/L — ABNORMAL LOW (ref 135–145)

## 2020-09-30 LAB — CBC WITH DIFFERENTIAL/PLATELET
Abs Immature Granulocytes: 0.03 10*3/uL (ref 0.00–0.07)
Basophils Absolute: 0 10*3/uL (ref 0.0–0.1)
Basophils Relative: 0 %
Eosinophils Absolute: 0.2 10*3/uL (ref 0.0–0.5)
Eosinophils Relative: 2 %
HCT: 46.5 % (ref 39.0–52.0)
Hemoglobin: 15.3 g/dL (ref 13.0–17.0)
Immature Granulocytes: 0 %
Lymphocytes Relative: 24 %
Lymphs Abs: 2.3 10*3/uL (ref 0.7–4.0)
MCH: 32 pg (ref 26.0–34.0)
MCHC: 32.9 g/dL (ref 30.0–36.0)
MCV: 97.3 fL (ref 80.0–100.0)
Monocytes Absolute: 0.8 10*3/uL (ref 0.1–1.0)
Monocytes Relative: 8 %
Neutro Abs: 6.3 10*3/uL (ref 1.7–7.7)
Neutrophils Relative %: 66 %
Platelets: 207 10*3/uL (ref 150–400)
RBC: 4.78 MIL/uL (ref 4.22–5.81)
RDW: 12.6 % (ref 11.5–15.5)
WBC: 9.6 10*3/uL (ref 4.0–10.5)
nRBC: 0 % (ref 0.0–0.2)

## 2020-09-30 NOTE — ED Triage Notes (Signed)
Pt arrives via POV c/o open skin sore on Superior scalp 2X3cm in size with inflammation around the site and drainage. Pt also present with 2X2cm sore on his chin with inflammation around the site. Pt reports these have been present over a week and have occurred before in the past. Pt reports cleaning them with peroxide at home and neosporin.

## 2020-10-01 MED ORDER — SULFAMETHOXAZOLE-TRIMETHOPRIM 800-160 MG PO TABS
1.0000 | ORAL_TABLET | Freq: Once | ORAL | Status: AC
  Administered 2020-10-01: 1 via ORAL
  Filled 2020-10-01: qty 1

## 2020-10-01 MED ORDER — SULFAMETHOXAZOLE-TRIMETHOPRIM 800-160 MG PO TABS: 1.0000 | ORAL_TABLET | Freq: Two times a day (BID) | ORAL | 0 refills | Status: DC

## 2020-10-01 MED ORDER — CEPHALEXIN 500 MG PO CAPS: 500.0000 mg | ORAL_CAPSULE | Freq: Three times a day (TID) | ORAL | 0 refills | Status: DC

## 2020-10-01 MED ORDER — CEPHALEXIN 500 MG PO CAPS
500.0000 mg | ORAL_CAPSULE | Freq: Once | ORAL | Status: AC
  Administered 2020-10-01: 500 mg via ORAL
  Filled 2020-10-01: qty 1

## 2020-10-01 NOTE — ED Provider Notes (Signed)
[facility] EMERGENCY DEPARTMENT Provider Note   CSN: [id number] Arrival date & time: 09/30/20  1835   Time seen 11:55 PM  History Chief Complaint  Patient presents with  . Recurrent Skin Infections    Gregory Murphy is a 34 y.o. male.  HPI   Patient states he had a small bump on his scalp about 3 weeks ago.  He states when it started he had no pain or no itching.  He states it has been getting bigger.  About a week later he started getting similar thing happen on his chin.  He states his hair started falling out and it was draining about a week ago.  He denies any or fever or chills.  He states he had something similar about a year ago and he was seen by physician who thought he had a brown recluse spider bite and was treated with antibiotics and got better.  He denies any known exposure to his spiders or being in an environment that spiders would be.  He states now he does have soreness in the area of his scalp and he is having lots of itching of his scalp and his chin.  He denies any fever, chills, headache, change in his vision.  He also states about a year ago he had a lesion on his leg that did finally drained and improved with the antibiotics.  PCP Patient, [name]   Past Medical History:  Diagnosis Date  . Brain injury (HCC)   . Depression   . Insomnia   . Panic attack     There are no problems to display for this patient.   Past Surgical History:  Procedure Laterality Date  . ABDOMINAL SURGERY    . DISTAL FEMUR BONE BRIDGE EXCISION Left   . FOREARM SURGERY Left    x4-metal rods placed  . WRIST SURGERY Right    pins       History reviewed. No pertinent family history.  Social History   Tobacco Use  . Smoking status: Current Every Day Smoker    Packs/day: 0.50    Years: 14.00    Pack years: 7.00    Types: Cigarettes  . Smokeless tobacco: Never Used  Vaping Use  . Vaping Use: Never used  Substance Use Topics  . Alcohol use: Yes    Alcohol/week:  7.0 standard drinks    Types: 7 Cans of beer per week    Comment: admits to drinking today 3-4  . Drug use: No    Home Medications Prior to Admission medications   Medication Sig Start Date End Date Taking? Authorizing Provider  acetaminophen (TYLENOL) 500 MG tablet Take 500 mg by mouth every 6 (six) hours as needed for mild pain or moderate pain.    [provider]  albuterol (PROVENTIL) (2.5 MG/3ML) 0.083% nebulizer solution Take 2.5 mg by nebulization every 6 (six) hours as needed for wheezing or shortness of breath.    [provider]  benzonatate (TESSALON) 100 MG capsule Take 2 capsules (200 mg total) by mouth 3 (three) times daily as needed. 02/15/19   Burgess Amor, PA-C  cephALEXin (KEFLEX) 500 MG capsule Take 1 capsule (500 mg total) by mouth 3 (three) times daily. 10/01/20   Devoria Albe, MD  predniSONE (DELTASONE) 50 MG tablet Take one tablet daily for 5 days 02/15/19   Burgess Amor, PA-C  sulfamethoxazole-trimethoprim (BACTRIM DS) 800-160 MG tablet Take 1 tablet by mouth 2 (two) times daily. 10/01/20   Lynelle Doctor,  Jodelle Gross, MD    Allergies    Patient has no known allergies.  Review of Systems   Review of Systems  All other systems reviewed and are negative.   Physical Exam Updated Vital Signs BP (!) 143/78 (BP Location: Left Arm)   Pulse (!) 105   Temp 98 F (36.7 C) (Oral)   Resp 18   Ht 5\' 1"  (1.549 m)   Wt 54.4 kg   SpO2 98%   BMI 22.67 kg/m   Physical Exam Vitals and nursing note reviewed.  Constitutional:      General: He is not in acute distress.    Appearance: Normal appearance. He is normal weight.  HENT:     Head: Normocephalic.     Comments: Patient has a 2 cm open ulcer in his scalp just to the left of midline.  There is a large area of loss of hair around the area.  The ulcer extends through the dermis.  Patient has a 1 cm area on his right chin that is red around it.     Right Ear: External ear normal.     Left Ear: External ear normal.    Eyes:     Extraocular Movements: Extraocular movements intact.     Conjunctiva/sclera: Conjunctivae normal.     Pupils: Pupils are equal, round, and reactive to light.  Cardiovascular:     Rate and Rhythm: Normal rate and regular rhythm.  Pulmonary:     Effort: Pulmonary effort is normal. No respiratory distress.     Breath sounds: Normal breath sounds.  Musculoskeletal:        General: Normal range of motion.     Cervical back: Normal range of motion.  Skin:    General: Skin is warm and dry.  Neurological:     General: No focal deficit present.     Mental Status: He is alert and oriented to person, place, and time.     Cranial Nerves: No cranial nerve deficit.  Psychiatric:        Mood and Affect: Mood normal.        Behavior: Behavior normal.        Thought Content: Thought content normal.         ED Results / Procedures / Treatments   Labs (all labs ordered are listed, but only abnormal results are displayed) Results for orders placed or performed during the hospital encounter of 09/30/20  CBC with Differential/Platelet  Result Value Ref Range   WBC 9.6 4.0 - 10.5 K/uL   RBC 4.78 4.22 - 5.81 MIL/uL   Hemoglobin 15.3 13.0 - 17.0 g/dL   HCT 10/02/20 39 - 52 %   MCV 97.3 80.0 - 100.0 fL   MCH 32.0 26.0 - 34.0 pg   MCHC 32.9 30.0 - 36.0 g/dL   RDW 16.1 09.6 - 04.5 %   Platelets 207 150 - 400 K/uL   nRBC 0.0 0.0 - 0.2 %   Neutrophils Relative % 66 %   Neutro Abs 6.3 1.7 - 7.7 K/uL   Lymphocytes Relative 24 %   Lymphs Abs 2.3 0.7 - 4.0 K/uL   Monocytes Relative 8 %   Monocytes Absolute 0.8 0.1 - 1.0 K/uL   Eosinophils Relative 2 %   Eosinophils Absolute 0.2 0.0 - 0.5 K/uL   Basophils Relative 0 %   Basophils Absolute 0.0 0.0 - 0.1 K/uL   Immature Granulocytes 0 %   Abs Immature Granulocytes 0.03 0.00 - 0.07 K/uL  Basic  metabolic panel  Result Value Ref Range   Sodium 131 (L) 135 - 145 mmol/L   Potassium 3.8 3.5 - 5.1 mmol/L   Chloride 92 (L) 98 - 111 mmol/L    CO2 29 22 - 32 mmol/L   Glucose, Bld 95 70 - 99 mg/dL   BUN 7 6 - 20 mg/dL   Creatinine, Ser 7.82 0.61 - 1.24 mg/dL   Calcium 8.9 8.9 - 95.6 mg/dL   GFR, Estimated >21 >30 mL/min   Anion gap 10 5 - 15   Laboratory interpretation all normal except hyponatremia    EKG None  Radiology No results found.  Procedures Procedures (including critical care time)  Medications Ordered in ED Medications  sulfamethoxazole-trimethoprim (BACTRIM DS) 800-160 MG per tablet 1 tablet (1 tablet Oral Given 10/01/20 0041)  cephALEXin (KEFLEX) capsule 500 mg (500 mg Oral Given 10/01/20 0041)    ED Course  I have reviewed the triage vital signs and the nursing notes.  Pertinent labs & imaging results that were available during my care of the patient were reviewed by me and considered in my medical decision making (see chart for details).    MDM Rules/Calculators/A&P                          Patient presents with a ulcerated area on his scalp which he states he had also about a year ago and an area on his chin.  He states he has soreness in the area and lots of itching.  He has had hair loss.  He denies fever or chills.  I will start him on antibiotics and will refer him to dermatology.   Final Clinical Impression(s) / ED Diagnoses Final diagnoses:  Abscess    Rx / DC Orders ED Discharge Orders         Ordered    cephALEXin (KEFLEX) 500 MG capsule  3 times daily        10/01/20 0020    sulfamethoxazole-trimethoprim (BACTRIM DS) 800-160 MG tablet  2 times daily        10/01/20 0020         Plan discharge  Devoria Albe, MD, Concha Pyo, MD 10/01/20 [phone]

## 2020-10-01 NOTE — Discharge Instructions (Addendum)
Take the antibiotics as prescribed.  Stop using the peroxide in the wounds, peroxide will delay healing.  Also stop using Neosporin, some people develop an allergy to it.  You can use any antibiotic ointment that does not contain neomycin.  If you are not improving over the weekend, call the dermatologist office to be seen and evaluated.  Return to the emergency department if you get a fever or severe headache.

## 2021-05-13 ENCOUNTER — Emergency Department (HOSPITAL_COMMUNITY): Payer: Self-pay

## 2021-05-13 ENCOUNTER — Emergency Department (HOSPITAL_COMMUNITY)
Admission: EM | Admit: 2021-05-13 | Discharge: 2021-05-13 | Disposition: A | Discharge status: Home / Self Care | Payer: Self-pay | Attending: Emergency Medicine | Admitting: Emergency Medicine

## 2021-05-13 ENCOUNTER — Encounter (HOSPITAL_COMMUNITY): Payer: Self-pay | Admitting: Emergency Medicine

## 2021-05-13 DIAGNOSIS — F17210 Nicotine dependence, cigarettes, uncomplicated: Secondary | ICD-10-CM | POA: Insufficient documentation

## 2021-05-13 DIAGNOSIS — W312XXA Contact with powered woodworking and forming machines, initial encounter: Secondary | ICD-10-CM | POA: Insufficient documentation

## 2021-05-13 DIAGNOSIS — S61217A Laceration without foreign body of left little finger without damage to nail, initial encounter: Secondary | ICD-10-CM | POA: Insufficient documentation

## 2021-05-13 MED ORDER — OXYCODONE-ACETAMINOPHEN 5-325 MG PO TABS
1.0000 | ORAL_TABLET | Freq: Once | ORAL | Status: AC
  Administered 2021-05-13: 1 via ORAL
  Filled 2021-05-13: qty 1

## 2021-05-13 MED ORDER — CEPHALEXIN 500 MG PO CAPS: 500.0000 mg | ORAL_CAPSULE | Freq: Three times a day (TID) | ORAL | 0 refills | Status: AC

## 2021-05-13 MED ORDER — LIDOCAINE HCL (PF) 1 % IJ SOLN
20.0000 mL | Freq: Once | INTRAMUSCULAR | Status: AC
  Administered 2021-05-13: 20 mL
  Filled 2021-05-13: qty 20

## 2021-05-13 MED ORDER — CEFAZOLIN SODIUM-DEXTROSE 1-4 GM/50ML-% IV SOLN
1.0000 g | Freq: Once | INTRAVENOUS | Status: AC
  Administered 2021-05-13: 1 g via INTRAVENOUS
  Filled 2021-05-13: qty 50

## 2021-05-13 NOTE — ED Provider Notes (Signed)
[facility] EMERGENCY DEPARTMENT Provider Note   CSN: [id number] Arrival date & time: 05/13/21  1457     History Chief Complaint  Patient presents with  . Extremity Laceration    Gregory Murphy is a 35 y.o. male.  HPI 35 year old male with no prior medical history presents the emergency department for a left finger injury.  Just prior to arrival, took a saw and excellently cut his left fifth finger on the PIP joint, dorsal aspect.  Is hemostatic.  States he is right-handed.  Problems been present for about an hour, constant and unchanged.  Nothing makes it better, nothing makes it worse.  Denies taking anything for the pain.  States he can move his finger and full range of motion.  Reports Tdap updated within the year.  Denies history of injury to this finger previously.  Denies other traumatic injuries.  Denies weakness or numbness.     Past Medical History:  Diagnosis Date  . Brain injury (HCC)   . Depression   . Insomnia   . Panic attack     There are no problems to display for this patient.   Past Surgical History:  Procedure Laterality Date  . ABDOMINAL SURGERY    . DISTAL FEMUR BONE BRIDGE EXCISION Left   . FOREARM SURGERY Left    x4-metal rods placed  . WRIST SURGERY Right    pins       No family history on file.  Social History   Tobacco Use  . Smoking status: Current Every Day Smoker    Packs/day: 0.50    Years: 14.00    Pack years: 7.00    Types: Cigarettes  . Smokeless tobacco: Never Used  Vaping Use  . Vaping Use: Never used  Substance Use Topics  . Alcohol use: Yes    Alcohol/week: 7.0 standard drinks    Types: 7 Cans of beer per week    Comment: admits to drinking today 3-4  . Drug use: No    Home Medications Prior to Admission medications   Medication Sig Start Date End Date Taking? Authorizing Provider  cephALEXin (KEFLEX) 500 MG capsule Take 1 capsule (500 mg total) by mouth 3 (three) times daily for 5 days.  05/13/21 05/18/21 Yes Louretta Parma, DO  acetaminophen (TYLENOL) 500 MG tablet Take 500 mg by mouth every 6 (six) hours as needed for mild pain or moderate pain.    [provider]  albuterol (PROVENTIL) (2.5 MG/3ML) 0.083% nebulizer solution Take 2.5 mg by nebulization every 6 (six) hours as needed for wheezing or shortness of breath.    [provider]  benzonatate (TESSALON) 100 MG capsule Take 2 capsules (200 mg total) by mouth 3 (three) times daily as needed. 02/15/19   Burgess Amor, PA-C  predniSONE (DELTASONE) 50 MG tablet Take one tablet daily for 5 days 02/15/19   Burgess Amor, PA-C  sulfamethoxazole-trimethoprim (BACTRIM DS) 800-160 MG tablet Take 1 tablet by mouth 2 (two) times daily. 10/01/20   Devoria Albe, MD    Allergies    Patient has no known allergies.  Review of Systems   Review of Systems  Constitutional: Negative for chills and fever.  HENT: Negative for ear pain and sore throat.   Eyes: Negative for pain and visual disturbance.  Respiratory: Negative for cough and shortness of breath.   Cardiovascular: Negative for chest pain and palpitations.  Gastrointestinal: Negative for abdominal pain and vomiting.  Genitourinary: Negative for dysuria and hematuria.  Musculoskeletal:  Positive for arthralgias and joint swelling. Negative for back pain.  Skin: Positive for wound. Negative for color change and rash.  Neurological: Negative for seizures and syncope.  All other systems reviewed and are negative.   Physical Exam Updated Vital Signs BP (!) 142/91   Pulse 80   Temp 98.4 F (36.9 C) (Oral)   Resp 16   SpO2 97%   Physical Exam Vitals and nursing note reviewed.  Constitutional:      Appearance: Normal appearance. He is well-developed.  HENT:     Head: Normocephalic and atraumatic.     Nose: Nose normal.     Mouth/Throat:     Mouth: Mucous membranes are moist.  Eyes:     Conjunctiva/sclera: Conjunctivae normal.  Cardiovascular:     Rate and  Rhythm: Normal rate and regular rhythm.     Heart sounds: No murmur heard.   Pulmonary:     Effort: Pulmonary effort is normal. No respiratory distress.     Breath sounds: Normal breath sounds.  Abdominal:     Palpations: Abdomen is soft.     Tenderness: There is no abdominal tenderness.  Musculoskeletal:        General: Normal range of motion.     Cervical back: Neck supple.     Comments: Laceration to posterior left fifth PIP.  Gaping, exposed bone present.  No obvious foreign body.  Sensation intact distal to joint.  Radial pulses 2+.  Can fully extend and flex DIP and PIP, but does report pain with movement.  Capillary refill is intact.  Skin:    General: Skin is warm and dry.     Capillary Refill: Capillary refill takes less than 2 seconds.  Neurological:     General: No focal deficit present.     Mental Status: He is alert and oriented to person, place, and time.  Psychiatric:        Mood and Affect: Mood normal.        Behavior: Behavior normal.     ED Results / Procedures / Treatments   Labs (all labs ordered are listed, but only abnormal results are displayed) Labs Reviewed - No data to display  EKG None  Radiology DG Finger Little Left  Result Date: 05/13/2021 CLINICAL DATA:  Laceration from saw EXAM: LEFT LITTLE FINGER 2+V COMPARISON:  None. FINDINGS: There is no evidence of fracture or dislocation. There is no evidence of arthropathy or other focal bone abnormality. Soft tissue laceration over the proximal interphalangeal joint. IMPRESSION: No fracture or dislocation of the left fifth finger. No radiopaque foreign body. Electronically Signed   By: Lauralyn Primes M.D.   On: 05/13/2021 16:18    Procedures .Marland KitchenLaceration Repair  Date/Time: 05/13/2021 8:07 PM Performed by: Louretta Parma, DO Authorized by: Sabino Donovan, MD   Consent:    Consent obtained:  Verbal   Consent given by:  Patient   Risks, benefits, and alternatives were discussed: yes     Risks  discussed:  Infection, pain, retained foreign body, tendon damage, vascular damage, poor wound healing, poor cosmetic result, need for additional repair and nerve damage   Alternatives discussed:  No treatment and delayed treatment Universal protocol:    Patient identity confirmed:  Verbally with patient and arm band Anesthesia:    Anesthesia method:  Local infiltration (Digital block)   Local anesthetic:  Lidocaine 1% w/o epi Laceration details:    Location:  Finger   Finger location:  L small finger   Length (  cm):  2   Depth (mm):  3 Pre-procedure details:    Preparation:  Patient was prepped and draped in usual sterile fashion Exploration:    Limited defect created (wound extended): no     Hemostasis achieved with:  Tourniquet   Imaging obtained: x-ray     Imaging outcome: foreign body not noted     Wound exploration: wound explored through full range of motion and entire depth of wound visualized     Wound extent: muscle damage     Contaminated: no   Treatment:    Area cleansed with:  Saline and povidone-iodine   Amount of cleaning:  Extensive   Irrigation solution:  Sterile saline and tap water   Irrigation volume:  2L   Irrigation method:  Syringe   Visualized foreign bodies/material removed: no     Debridement:  Moderate   Undermining:  Extensive   Scar revision: no   Skin repair:    Repair method:  Sutures   Suture size:  5-0   Suture material:  Nylon   Suture technique:  Simple interrupted   Number of sutures:  6 Approximation:    Approximation:  Loose Repair type:    Repair type:  Complex Post-procedure details:    Dressing:  Splint for protection   Procedure completion:  Tolerated well, no immediate complications     Medications Ordered in ED Medications  ceFAZolin (ANCEF) IVPB 1 g/50 mL premix (0 g Intravenous Stopped 05/13/21 1711)  oxyCODONE-acetaminophen (PERCOCET/ROXICET) 5-325 MG per tablet 1 tablet (1 tablet Oral Given 05/13/21 1641)  lidocaine (PF)  (XYLOCAINE) 1 % injection 20 mL (20 mLs Infiltration Given 05/13/21 1640)    ED Course  I have reviewed the triage vital signs and the nursing notes.  Pertinent labs & imaging results that were available during my care of the patient were reviewed by me and considered in my medical decision making (see chart for details).    MDM Rules/Calculators/A&P                          35 year old male presents emergency department for left hand injury.  Upon arrival, vital signs are stable.  He is not in acute distress.  Exam with laceration to left dorsal PIP joint as above.  Hemostatic currently.  Compartments are soft.  Neurovascularly intact.  Differential includes: Strain, sprain, fracture, dislocation, open fracture, septic arthritis  X-ray shows open fracture on my review, though radiology did not read that there was a bone defect.  Presentation most consistent with open fracture.  Wound was thoroughly irrigated.  Tdap is up-to-date.  Patient given IV Ancef.  Percocet given for pain.  Wound repaired as above.  Given referral information for hand surgery.  Splint paced per Ortho tech.  Patient feels comfortable going home, remains neurovascularly intact.  Reassessed range of motion after procedure, patient retains the ability to completely flex and extend his finger.  Discussed symptomatic management and return precautions.  Recommended close hand surgery follow-up.  Prescribed antibiotics for wound prophylaxis.  Final Clinical Impression(s) / ED Diagnoses Final diagnoses:  Laceration of left little finger without foreign body without damage to nail, initial encounter    Rx / DC Orders ED Discharge Orders         Ordered    cephALEXin (KEFLEX) 500 MG capsule  3 times daily        05/13/21 1802  Louretta ParmaStewardson, Hersey Maclellan, DO 05/13/21 2010    Sabino DonovanKatz, Eric C, MD 05/13/21 [phone]

## 2021-05-13 NOTE — ED Triage Notes (Signed)
Laceration to L 5th digit from a saw just PTA.  CMS intact.  Bleeding controlled.

## 2021-05-13 NOTE — ED Notes (Signed)
Suture cart at bedside 

## 2021-05-13 NOTE — Discharge Instructions (Signed)
Alternate ibuprofen and Tylenol as needed for pain.  Elevate your finger above the level of your heart anytime you are sitting down. Ice can help with pain and swelling as well. Call Dr. Merrilee Seashore office to schedule follow-up appointment within the week. Watch for signs of infection such as swelling, redness, pus, worsening pain and warmth-these would be signs that you need to see a doctor sooner.

## 2021-05-13 NOTE — Progress Notes (Signed)
Orthopedic Tech Progress Note Patient Details:  Gregory Murphy Oct 16, 1986 [id number]  Ortho Devices Type of Ortho Device: Finger splint Ortho Device/Splint Location: LUE Ortho Device/Splint Interventions: Ordered,Application   Post Interventions Patient Tolerated: Well Instructions Provided: Care of device   [name] 05/13/2021, 6:13 PM

## 2021-05-23 ENCOUNTER — Ambulatory Visit
Admission: EM | Admit: 2021-05-23 | Discharge: 2021-05-23 | Disposition: A | Discharge status: Home / Self Care | Payer: 59

## 2021-05-23 ENCOUNTER — Encounter: Payer: Self-pay | Admitting: Emergency Medicine

## 2021-05-23 ENCOUNTER — Other Ambulatory Visit: Payer: Self-pay

## 2021-05-23 DIAGNOSIS — Z4802 Encounter for removal of sutures: Secondary | ICD-10-CM | POA: Diagnosis not present

## 2021-05-23 NOTE — ED Triage Notes (Signed)
Here for suture removal of sutures in left pinky finger that was placed 11 days ago.

## 2021-05-23 NOTE — ED Notes (Signed)
5 sutures removed from left pinky finger.  Area clean and dry and healing well.

## 2021-12-16 ENCOUNTER — Emergency Department (HOSPITAL_COMMUNITY): Payer: 59

## 2021-12-16 ENCOUNTER — Inpatient Hospital Stay (HOSPITAL_COMMUNITY)
Admission: EM | Admit: 2021-12-16 | Discharge: 2021-12-22 | DRG: 481 | Disposition: A | Discharge status: Home Health | Payer: 59 | Attending: Orthopaedic Surgery | Admitting: Orthopaedic Surgery

## 2021-12-16 DIAGNOSIS — Z23 Encounter for immunization: Secondary | ICD-10-CM

## 2021-12-16 DIAGNOSIS — S79921A Unspecified injury of right thigh, initial encounter: Secondary | ICD-10-CM | POA: Diagnosis not present

## 2021-12-16 DIAGNOSIS — M25462 Effusion, left knee: Secondary | ICD-10-CM | POA: Diagnosis not present

## 2021-12-16 DIAGNOSIS — E43 Unspecified severe protein-calorie malnutrition: Secondary | ICD-10-CM | POA: Insufficient documentation

## 2021-12-16 DIAGNOSIS — S72302A Unspecified fracture of shaft of left femur, initial encounter for closed fracture: Secondary | ICD-10-CM | POA: Diagnosis not present

## 2021-12-16 DIAGNOSIS — S0993XA Unspecified injury of face, initial encounter: Secondary | ICD-10-CM | POA: Diagnosis not present

## 2021-12-16 DIAGNOSIS — E440 Moderate protein-calorie malnutrition: Secondary | ICD-10-CM | POA: Insufficient documentation

## 2021-12-16 DIAGNOSIS — D62 Acute posthemorrhagic anemia: Secondary | ICD-10-CM | POA: Diagnosis not present

## 2021-12-16 DIAGNOSIS — T07XXXA Unspecified multiple injuries, initial encounter: Secondary | ICD-10-CM | POA: Diagnosis present

## 2021-12-16 DIAGNOSIS — S82261A Displaced segmental fracture of shaft of right tibia, initial encounter for closed fracture: Secondary | ICD-10-CM | POA: Diagnosis present

## 2021-12-16 DIAGNOSIS — T148XXA Other injury of unspecified body region, initial encounter: Secondary | ICD-10-CM

## 2021-12-16 DIAGNOSIS — Y92410 Unspecified street and highway as the place of occurrence of the external cause: Secondary | ICD-10-CM

## 2021-12-16 DIAGNOSIS — S72452A Displaced supracondylar fracture without intracondylar extension of lower end of left femur, initial encounter for closed fracture: Secondary | ICD-10-CM | POA: Diagnosis not present

## 2021-12-16 DIAGNOSIS — Z419 Encounter for procedure for purposes other than remedying health state, unspecified: Secondary | ICD-10-CM

## 2021-12-16 DIAGNOSIS — M25532 Pain in left wrist: Secondary | ICD-10-CM | POA: Diagnosis not present

## 2021-12-16 DIAGNOSIS — Z041 Encounter for examination and observation following transport accident: Secondary | ICD-10-CM | POA: Diagnosis not present

## 2021-12-16 DIAGNOSIS — S82201A Unspecified fracture of shaft of right tibia, initial encounter for closed fracture: Secondary | ICD-10-CM

## 2021-12-16 DIAGNOSIS — M7989 Other specified soft tissue disorders: Secondary | ICD-10-CM | POA: Diagnosis not present

## 2021-12-16 DIAGNOSIS — M9712XA Periprosthetic fracture around internal prosthetic left knee joint, initial encounter: Secondary | ICD-10-CM | POA: Diagnosis present

## 2021-12-16 DIAGNOSIS — Z6821 Body mass index (BMI) 21.0-21.9, adult: Secondary | ICD-10-CM

## 2021-12-16 DIAGNOSIS — F17210 Nicotine dependence, cigarettes, uncomplicated: Secondary | ICD-10-CM | POA: Diagnosis present

## 2021-12-16 DIAGNOSIS — S7290XA Unspecified fracture of unspecified femur, initial encounter for closed fracture: Secondary | ICD-10-CM | POA: Diagnosis present

## 2021-12-16 DIAGNOSIS — Z20822 Contact with and (suspected) exposure to covid-19: Secondary | ICD-10-CM | POA: Diagnosis present

## 2021-12-16 DIAGNOSIS — S72402A Unspecified fracture of lower end of left femur, initial encounter for closed fracture: Secondary | ICD-10-CM | POA: Diagnosis not present

## 2021-12-16 DIAGNOSIS — S82301A Unspecified fracture of lower end of right tibia, initial encounter for closed fracture: Secondary | ICD-10-CM | POA: Diagnosis not present

## 2021-12-16 LAB — I-STAT CHEM 8, ED
BUN: 7 mg/dL (ref 6–20)
Calcium, Ion: 1.11 mmol/L — ABNORMAL LOW (ref 1.15–1.40)
Chloride: 104 mmol/L (ref 98–111)
Creatinine, Ser: 0.7 mg/dL (ref 0.61–1.24)
Glucose, Bld: 108 mg/dL — ABNORMAL HIGH (ref 70–99)
HCT: 55 % — ABNORMAL HIGH (ref 39.0–52.0)
Hemoglobin: 18.7 g/dL — ABNORMAL HIGH (ref 13.0–17.0)
Potassium: 4.6 mmol/L (ref 3.5–5.1)
Sodium: 139 mmol/L (ref 135–145)
TCO2: 24 mmol/L (ref 22–32)

## 2021-12-16 LAB — RAPID URINE DRUG SCREEN, HOSP PERFORMED
Amphetamines: NOT DETECTED
Barbiturates: NOT DETECTED
Benzodiazepines: NOT DETECTED
Cocaine: NOT DETECTED
Opiates: POSITIVE — AB
Tetrahydrocannabinol: NOT DETECTED

## 2021-12-16 LAB — CBC
HCT: 48.4 % (ref 39.0–52.0)
Hemoglobin: 16.2 g/dL (ref 13.0–17.0)
MCH: 31.4 pg (ref 26.0–34.0)
MCHC: 33.5 g/dL (ref 30.0–36.0)
MCV: 93.8 fL (ref 80.0–100.0)
Platelets: 224 10*3/uL (ref 150–400)
RBC: 5.16 MIL/uL (ref 4.22–5.81)
RDW: 13.5 % (ref 11.5–15.5)
WBC: 13.4 10*3/uL — ABNORMAL HIGH (ref 4.0–10.5)
nRBC: 0 % (ref 0.0–0.2)

## 2021-12-16 LAB — TYPE AND SCREEN
ABO/RH(D): A POS
Antibody Screen: NEGATIVE

## 2021-12-16 LAB — COMPREHENSIVE METABOLIC PANEL
ALT: 18 U/L (ref 0–44)
AST: 34 U/L (ref 15–41)
Albumin: 4.2 g/dL (ref 3.5–5.0)
Alkaline Phosphatase: 79 U/L (ref 38–126)
Anion gap: 11 (ref 5–15)
BUN: 5 mg/dL — ABNORMAL LOW (ref 6–20)
CO2: 23 mmol/L (ref 22–32)
Calcium: 9.7 mg/dL (ref 8.9–10.3)
Chloride: 103 mmol/L (ref 98–111)
Creatinine, Ser: 0.74 mg/dL (ref 0.61–1.24)
GFR, Estimated: >60 mL/min (ref >60)
Glucose, Bld: 109 mg/dL — ABNORMAL HIGH (ref 70–99)
Potassium: 4.5 mmol/L (ref 3.5–5.1)
Sodium: 137 mmol/L (ref 135–145)
Total Bilirubin: 0.5 mg/dL (ref 0.3–1.2)
Total Protein: 8.4 g/dL — ABNORMAL HIGH (ref 6.5–8.1)

## 2021-12-16 LAB — URINALYSIS, ROUTINE W REFLEX MICROSCOPIC
Bacteria, UA: NONE SEEN
Bilirubin Urine: NEGATIVE
Glucose, UA: NEGATIVE mg/dL
Ketones, ur: NEGATIVE mg/dL
Leukocytes,Ua: NEGATIVE
Nitrite: NEGATIVE
Protein, ur: NEGATIVE mg/dL
Specific Gravity, Urine: >1.046 — ABNORMAL HIGH (ref 1.005–1.030)
pH: 5 (ref 5.0–8.0)

## 2021-12-16 LAB — RESP PANEL BY RT-PCR (FLU A&B, COVID) ARPGX2
Influenza A by PCR: NEGATIVE
Influenza B by PCR: NEGATIVE
SARS Coronavirus 2 by RT PCR: NEGATIVE

## 2021-12-16 LAB — PROTIME-INR
INR: 0.9 (ref 0.8–1.2)
Prothrombin Time: 12.4 seconds (ref 11.4–15.2)

## 2021-12-16 LAB — LACTIC ACID, PLASMA: Lactic Acid, Venous: 2.5 mmol/L (ref 0.5–1.9)

## 2021-12-16 LAB — ETHANOL: Alcohol, Ethyl (B): 114 mg/dL — ABNORMAL HIGH (ref <10)

## 2021-12-16 MED ORDER — FENTANYL CITRATE (PF) 100 MCG/2ML IJ SOLN
INTRAMUSCULAR | Status: AC
  Filled 2021-12-16: qty 2

## 2021-12-16 MED ORDER — LORAZEPAM 1 MG PO TABS
1.0000 mg | ORAL_TABLET | ORAL | Status: AC | PRN
  Administered 2021-12-16: 2 mg via ORAL
  Filled 2021-12-16: qty 2

## 2021-12-16 MED ORDER — THIAMINE HCL 100 MG PO TABS
100.0000 mg | ORAL_TABLET | Freq: Every day | ORAL | Status: DC
  Administered 2021-12-18 – 2021-12-22 (×5): 100 mg via ORAL
  Filled 2021-12-16 (×5): qty 1

## 2021-12-16 MED ORDER — FOLIC ACID 1 MG PO TABS
1.0000 mg | ORAL_TABLET | Freq: Every day | ORAL | Status: DC
  Administered 2021-12-18 – 2021-12-22 (×5): 1 mg via ORAL
  Filled 2021-12-16 (×5): qty 1

## 2021-12-16 MED ORDER — FENTANYL CITRATE PF 50 MCG/ML IJ SOSY
PREFILLED_SYRINGE | INTRAMUSCULAR | Status: DC | PRN
  Administered 2021-12-16 (×2): 50 ug via INTRAVENOUS

## 2021-12-16 MED ORDER — HYDROMORPHONE HCL 1 MG/ML IJ SOLN
1.0000 mg | Freq: Once | INTRAMUSCULAR | Status: AC
  Administered 2021-12-16: 1 mg via INTRAVENOUS
  Filled 2021-12-16: qty 1

## 2021-12-16 MED ORDER — THIAMINE HCL 100 MG/ML IJ SOLN
100.0000 mg | Freq: Every day | INTRAMUSCULAR | Status: DC
  Filled 2021-12-16: qty 2

## 2021-12-16 MED ORDER — LORAZEPAM 2 MG/ML IJ SOLN
1.0000 mg | INTRAMUSCULAR | Status: AC | PRN
  Administered 2021-12-17: 2 mg via INTRAVENOUS
  Administered 2021-12-17: 1 mg via INTRAVENOUS
  Filled 2021-12-16 (×2): qty 1

## 2021-12-16 MED ORDER — IOHEXOL 300 MG/ML  SOLN
100.0000 mL | Freq: Once | INTRAMUSCULAR | Status: AC | PRN
  Administered 2021-12-16: 100 mL via INTRAVENOUS

## 2021-12-16 MED ORDER — TETANUS-DIPHTH-ACELL PERTUSSIS 5-2.5-18.5 LF-MCG/0.5 IM SUSY
0.5000 mL | PREFILLED_SYRINGE | Freq: Once | INTRAMUSCULAR | Status: AC
  Administered 2021-12-16: 0.5 mL via INTRAMUSCULAR
  Filled 2021-12-16: qty 0.5

## 2021-12-16 MED ORDER — FENTANYL CITRATE PF 50 MCG/ML IJ SOSY
50.0000 ug | PREFILLED_SYRINGE | Freq: Once | INTRAMUSCULAR | Status: DC
  Filled 2021-12-16: qty 1

## 2021-12-16 MED ORDER — ADULT MULTIVITAMIN W/MINERALS CH
1.0000 | ORAL_TABLET | Freq: Every day | ORAL | Status: DC
  Administered 2021-12-18 – 2021-12-22 (×5): 1 via ORAL
  Filled 2021-12-16 (×5): qty 1

## 2021-12-16 NOTE — ED Notes (Signed)
Orthopedic surgeon, Dr Odis Hollingshead, at bedside.

## 2021-12-16 NOTE — ED Notes (Signed)
C-collar removed by Dr Sedalia Muta

## 2021-12-16 NOTE — Progress Notes (Signed)
Orthopedic Tech Progress Note Patient Details:  Gregory Murphy 05-18-1986 [id number]  Ortho Devices Type of Ortho Device: Post (long leg) splint, Stirrup splint Ortho Device/Splint Location: lle posterior long leg splint, rle posterior long leg with long stirrups. Ortho Device/Splint Interventions: Ordered, Application, Adjustment  I applied the splints with assist from dr to hold during trauma before going to ct. Post Interventions Patient Tolerated: Well Instructions Provided: Care of device, Adjustment of device  [name] 12/16/2021, 9:20 PM
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
## 2021-12-16 NOTE — ED Notes (Signed)
Pt to CT.  Delay d/t bil LE splints being placed.
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
## 2021-12-17 ENCOUNTER — Inpatient Hospital Stay (HOSPITAL_COMMUNITY): Payer: 59

## 2021-12-17 ENCOUNTER — Encounter (HOSPITAL_COMMUNITY)
Admission: EM | Disposition: A | Discharge status: Home Health | Payer: Self-pay | Source: Home / Self Care | Attending: Orthopaedic Surgery

## 2021-12-17 ENCOUNTER — Encounter (HOSPITAL_COMMUNITY): Payer: Self-pay | Admitting: Orthopaedic Surgery

## 2021-12-17 ENCOUNTER — Inpatient Hospital Stay (HOSPITAL_COMMUNITY): Payer: 59 | Admitting: Certified Registered"

## 2021-12-17 ENCOUNTER — Emergency Department (HOSPITAL_COMMUNITY): Payer: 59

## 2021-12-17 DIAGNOSIS — T07XXXA Unspecified multiple injuries, initial encounter: Secondary | ICD-10-CM | POA: Diagnosis present

## 2021-12-17 DIAGNOSIS — Z6821 Body mass index (BMI) 21.0-21.9, adult: Secondary | ICD-10-CM | POA: Diagnosis not present

## 2021-12-17 DIAGNOSIS — S7292XA Unspecified fracture of left femur, initial encounter for closed fracture: Secondary | ICD-10-CM | POA: Diagnosis not present

## 2021-12-17 DIAGNOSIS — M7989 Other specified soft tissue disorders: Secondary | ICD-10-CM | POA: Diagnosis not present

## 2021-12-17 DIAGNOSIS — S72492A Other fracture of lower end of left femur, initial encounter for closed fracture: Secondary | ICD-10-CM | POA: Diagnosis not present

## 2021-12-17 DIAGNOSIS — Y92410 Unspecified street and highway as the place of occurrence of the external cause: Secondary | ICD-10-CM | POA: Diagnosis not present

## 2021-12-17 DIAGNOSIS — S7290XA Unspecified fracture of unspecified femur, initial encounter for closed fracture: Secondary | ICD-10-CM | POA: Diagnosis not present

## 2021-12-17 DIAGNOSIS — M9712XA Periprosthetic fracture around internal prosthetic left knee joint, initial encounter: Secondary | ICD-10-CM | POA: Diagnosis not present

## 2021-12-17 DIAGNOSIS — D62 Acute posthemorrhagic anemia: Secondary | ICD-10-CM | POA: Diagnosis not present

## 2021-12-17 DIAGNOSIS — S82201A Unspecified fracture of shaft of right tibia, initial encounter for closed fracture: Secondary | ICD-10-CM | POA: Diagnosis not present

## 2021-12-17 DIAGNOSIS — Z20822 Contact with and (suspected) exposure to covid-19: Secondary | ICD-10-CM | POA: Diagnosis not present

## 2021-12-17 DIAGNOSIS — G8918 Other acute postprocedural pain: Secondary | ICD-10-CM | POA: Diagnosis not present

## 2021-12-17 DIAGNOSIS — Z23 Encounter for immunization: Secondary | ICD-10-CM | POA: Diagnosis not present

## 2021-12-17 DIAGNOSIS — E440 Moderate protein-calorie malnutrition: Secondary | ICD-10-CM | POA: Diagnosis not present

## 2021-12-17 DIAGNOSIS — S72402A Unspecified fracture of lower end of left femur, initial encounter for closed fracture: Secondary | ICD-10-CM | POA: Diagnosis not present

## 2021-12-17 DIAGNOSIS — S82224A Nondisplaced transverse fracture of shaft of right tibia, initial encounter for closed fracture: Secondary | ICD-10-CM | POA: Diagnosis not present

## 2021-12-17 DIAGNOSIS — S82234A Nondisplaced oblique fracture of shaft of right tibia, initial encounter for closed fracture: Secondary | ICD-10-CM | POA: Diagnosis not present

## 2021-12-17 DIAGNOSIS — S82261A Displaced segmental fracture of shaft of right tibia, initial encounter for closed fracture: Secondary | ICD-10-CM | POA: Diagnosis not present

## 2021-12-17 DIAGNOSIS — S72452A Displaced supracondylar fracture without intracondylar extension of lower end of left femur, initial encounter for closed fracture: Secondary | ICD-10-CM | POA: Diagnosis not present

## 2021-12-17 DIAGNOSIS — F17210 Nicotine dependence, cigarettes, uncomplicated: Secondary | ICD-10-CM | POA: Diagnosis present

## 2021-12-17 DIAGNOSIS — S82401A Unspecified fracture of shaft of right fibula, initial encounter for closed fracture: Secondary | ICD-10-CM | POA: Diagnosis not present

## 2021-12-17 HISTORY — PX: TIBIA IM NAIL INSERTION: SHX2516

## 2021-12-17 HISTORY — PX: FEMUR CLOSED REDUCTION: SHX939

## 2021-12-17 HISTORY — PX: LEG SURGERY: SHX1003

## 2021-12-17 HISTORY — DX: Unspecified multiple injuries, initial encounter: T07.XXXA

## 2021-12-17 LAB — POCT I-STAT, CHEM 8
BUN: 10 mg/dL (ref 6–20)
Calcium, Ion: 1.16 mmol/L (ref 1.15–1.40)
Chloride: 101 mmol/L (ref 98–111)
Creatinine, Ser: 0.6 mg/dL — ABNORMAL LOW (ref 0.61–1.24)
Glucose, Bld: 113 mg/dL — ABNORMAL HIGH (ref 70–99)
HCT: 40 % (ref 39.0–52.0)
Hemoglobin: 13.6 g/dL (ref 13.0–17.0)
Potassium: 4.7 mmol/L (ref 3.5–5.1)
Sodium: 135 mmol/L (ref 135–145)
TCO2: 27 mmol/L (ref 22–32)

## 2021-12-17 LAB — ABO/RH: ABO/RH(D): A POS

## 2021-12-17 LAB — HIV ANTIBODY (ROUTINE TESTING W REFLEX): HIV Screen 4th Generation wRfx: NONREACTIVE

## 2021-12-17 LAB — SURGICAL PCR SCREEN
MRSA, PCR: NEGATIVE
Staphylococcus aureus: NEGATIVE

## 2021-12-17 SURGERY — CLOSED REDUCTION, FRACTURE, FEMUR, SHAFT
Anesthesia: General | Laterality: Left

## 2021-12-17 MED ORDER — FENTANYL CITRATE (PF) 250 MCG/5ML IJ SOLN
INTRAMUSCULAR | Status: DC | PRN
  Administered 2021-12-17: 50 ug via INTRAVENOUS
  Administered 2021-12-17: 25 ug via INTRAVENOUS
  Administered 2021-12-17 (×6): 50 ug via INTRAVENOUS
  Administered 2021-12-17: 25 ug via INTRAVENOUS
  Administered 2021-12-17: 50 ug via INTRAVENOUS

## 2021-12-17 MED ORDER — MIDAZOLAM HCL 2 MG/2ML IJ SOLN
INTRAMUSCULAR | Status: AC
  Filled 2021-12-17: qty 2

## 2021-12-17 MED ORDER — CEFAZOLIN SODIUM 1 G IJ SOLR
INTRAMUSCULAR | Status: AC
  Filled 2021-12-17: qty 20

## 2021-12-17 MED ORDER — DOCUSATE SODIUM 100 MG PO CAPS
100.0000 mg | ORAL_CAPSULE | Freq: Two times a day (BID) | ORAL | Status: DC
  Administered 2021-12-17 – 2021-12-19 (×4): 100 mg via ORAL
  Filled 2021-12-17 (×4): qty 1

## 2021-12-17 MED ORDER — ACETAMINOPHEN 10 MG/ML IV SOLN
INTRAVENOUS | Status: DC | PRN
  Administered 2021-12-17: 1000 mg via INTRAVENOUS

## 2021-12-17 MED ORDER — SODIUM CHLORIDE 0.9 % IV SOLN: INTRAVENOUS | Status: AC

## 2021-12-17 MED ORDER — KETAMINE HCL 50 MG/5ML IJ SOSY
PREFILLED_SYRINGE | INTRAMUSCULAR | Status: AC
  Filled 2021-12-17: qty 5

## 2021-12-17 MED ORDER — LACTATED RINGERS IV SOLN: INTRAVENOUS | Status: DC

## 2021-12-17 MED ORDER — DEXAMETHASONE SODIUM PHOSPHATE 10 MG/ML IJ SOLN
INTRAMUSCULAR | Status: AC
  Filled 2021-12-17: qty 1

## 2021-12-17 MED ORDER — ACETAMINOPHEN 10 MG/ML IV SOLN
INTRAVENOUS | Status: AC
  Filled 2021-12-17: qty 100

## 2021-12-17 MED ORDER — ONDANSETRON HCL 4 MG/2ML IJ SOLN
INTRAMUSCULAR | Status: DC | PRN
  Administered 2021-12-17: 4 mg via INTRAVENOUS

## 2021-12-17 MED ORDER — KETAMINE HCL-SODIUM CHLORIDE 100-0.9 MG/10ML-% IV SOSY
PREFILLED_SYRINGE | INTRAVENOUS | Status: DC | PRN
  Administered 2021-12-17 (×3): 10 mg via INTRAVENOUS

## 2021-12-17 MED ORDER — MORPHINE SULFATE (PF) 2 MG/ML IV SOLN
0.5000 mg | INTRAVENOUS | Status: DC | PRN
  Administered 2021-12-17 – 2021-12-18 (×11): 0.5 mg via INTRAVENOUS
  Filled 2021-12-17 (×11): qty 1

## 2021-12-17 MED ORDER — VANCOMYCIN HCL 1000 MG IV SOLR
INTRAVENOUS | Status: DC | PRN
  Administered 2021-12-17: 500 mg

## 2021-12-17 MED ORDER — FENTANYL CITRATE (PF) 250 MCG/5ML IJ SOLN
INTRAMUSCULAR | Status: AC
  Filled 2021-12-17: qty 5

## 2021-12-17 MED ORDER — 0.9 % SODIUM CHLORIDE (POUR BTL) OPTIME
TOPICAL | Status: DC | PRN
  Administered 2021-12-17: 1000 mL

## 2021-12-17 MED ORDER — CHLORHEXIDINE GLUCONATE 0.12 % MT SOLN
15.0000 mL | Freq: Once | OROMUCOSAL | Status: AC
  Administered 2021-12-17: 15 mL via OROMUCOSAL
  Filled 2021-12-17: qty 15

## 2021-12-17 MED ORDER — HYDROMORPHONE HCL 1 MG/ML IJ SOLN: 0.2500 mg | INTRAMUSCULAR | Status: DC | PRN

## 2021-12-17 MED ORDER — POVIDONE-IODINE 10 % EX SWAB
2.0000 "application " | Freq: Once | CUTANEOUS | Status: AC
  Administered 2021-12-17: 2 via TOPICAL

## 2021-12-17 MED ORDER — VANCOMYCIN HCL 1000 MG IV SOLR
INTRAVENOUS | Status: AC
  Filled 2021-12-17: qty 20

## 2021-12-17 MED ORDER — ESMOLOL HCL 100 MG/10ML IV SOLN
INTRAVENOUS | Status: DC | PRN
  Administered 2021-12-17: 20 mg via INTRAVENOUS

## 2021-12-17 MED ORDER — CHLORHEXIDINE GLUCONATE 4 % EX LIQD
60.0000 mL | Freq: Once | CUTANEOUS | Status: AC
  Administered 2021-12-17: 4 via TOPICAL
  Filled 2021-12-17: qty 60

## 2021-12-17 MED ORDER — CEFAZOLIN SODIUM-DEXTROSE 1-4 GM/50ML-% IV SOLN
1.0000 g | Freq: Three times a day (TID) | INTRAVENOUS | Status: AC
  Administered 2021-12-17 – 2021-12-18 (×2): 1 g via INTRAVENOUS
  Filled 2021-12-17 (×2): qty 50

## 2021-12-17 MED ORDER — CEFAZOLIN SODIUM-DEXTROSE 2-4 GM/100ML-% IV SOLN
2.0000 g | INTRAVENOUS | Status: AC
  Administered 2021-12-17 (×2): 2 g via INTRAVENOUS
  Filled 2021-12-17: qty 100

## 2021-12-17 MED ORDER — MIDAZOLAM HCL 2 MG/2ML IJ SOLN
INTRAMUSCULAR | Status: DC | PRN
  Administered 2021-12-17: 2 mg via INTRAVENOUS

## 2021-12-17 MED ORDER — ONDANSETRON HCL 4 MG/2ML IJ SOLN
INTRAMUSCULAR | Status: AC
  Filled 2021-12-17: qty 2

## 2021-12-17 MED ORDER — DEXMEDETOMIDINE (PRECEDEX) IN NS 20 MCG/5ML (4 MCG/ML) IV SYRINGE
PREFILLED_SYRINGE | INTRAVENOUS | Status: AC
  Filled 2021-12-17: qty 5

## 2021-12-17 MED ORDER — ORAL CARE MOUTH RINSE: 15.0000 mL | Freq: Once | OROMUCOSAL | Status: AC

## 2021-12-17 MED ORDER — PROPOFOL 10 MG/ML IV BOLUS
INTRAVENOUS | Status: AC
  Filled 2021-12-17: qty 20

## 2021-12-17 MED ORDER — DEXAMETHASONE SODIUM PHOSPHATE 10 MG/ML IJ SOLN
INTRAMUSCULAR | Status: DC | PRN
  Administered 2021-12-17: 10 mg via INTRAVENOUS

## 2021-12-17 MED ORDER — PROPOFOL 10 MG/ML IV BOLUS
INTRAVENOUS | Status: DC | PRN
  Administered 2021-12-17: 160 mg via INTRAVENOUS

## 2021-12-17 MED ORDER — ROCURONIUM BROMIDE 10 MG/ML (PF) SYRINGE
PREFILLED_SYRINGE | INTRAVENOUS | Status: DC | PRN
Start: 2021-12-17 — End: 2021-12-17
  Administered 2021-12-17: 50 mg via INTRAVENOUS
  Administered 2021-12-17 (×2): 20 mg via INTRAVENOUS
  Administered 2021-12-17: 40 mg via INTRAVENOUS
  Administered 2021-12-17: 30 mg via INTRAVENOUS

## 2021-12-17 MED ORDER — ENOXAPARIN SODIUM 40 MG/0.4ML IJ SOSY
40.0000 mg | PREFILLED_SYRINGE | INTRAMUSCULAR | Status: DC
  Administered 2021-12-18 – 2021-12-22 (×4): 40 mg via SUBCUTANEOUS
  Filled 2021-12-17 (×4): qty 0.4

## 2021-12-17 MED ORDER — SUGAMMADEX SODIUM 200 MG/2ML IV SOLN
INTRAVENOUS | Status: DC | PRN
Start: 2021-12-17 — End: 2021-12-17
  Administered 2021-12-17 (×2): 50 mg via INTRAVENOUS

## 2021-12-17 MED ORDER — LIDOCAINE 2% (20 MG/ML) 5 ML SYRINGE
INTRAMUSCULAR | Status: AC
  Filled 2021-12-17: qty 5

## 2021-12-17 MED ORDER — DEXMEDETOMIDINE (PRECEDEX) IN NS 20 MCG/5ML (4 MCG/ML) IV SYRINGE
PREFILLED_SYRINGE | INTRAVENOUS | Status: DC | PRN
  Administered 2021-12-17: 8 ug via INTRAVENOUS
  Administered 2021-12-17: 4 ug via INTRAVENOUS

## 2021-12-17 MED ORDER — HYDROCODONE-ACETAMINOPHEN 5-325 MG PO TABS
1.0000 | ORAL_TABLET | Freq: Four times a day (QID) | ORAL | Status: DC | PRN
  Administered 2021-12-17: 1 via ORAL
  Administered 2021-12-18: 2 via ORAL
  Filled 2021-12-17: qty 1
  Filled 2021-12-17: qty 2

## 2021-12-17 MED ORDER — LIDOCAINE 2% (20 MG/ML) 5 ML SYRINGE
INTRAMUSCULAR | Status: DC | PRN
  Administered 2021-12-17: 80 mg via INTRAVENOUS

## 2021-12-17 SURGICAL SUPPLY — 109 items
APL PRP STRL LF DISP 70% ISPRP (MISCELLANEOUS) ×6
BAG COUNTER SPONGE SURGICOUNT (BAG) ×4 IMPLANT
BAG SPNG CNTER NS LX DISP (BAG) ×2
BANDAGE ESMARK 6X9 LF (GAUZE/BANDAGES/DRESSINGS) IMPLANT
BAR EXFX 200X11 NS LF (EXFIX) ×2
BAR GLASS FIBER EXFX 11X200 (EXFIX) ×1 IMPLANT
BIT DRILL 3.8X6 NS (BIT) ×1 IMPLANT
BIT DRILL 4.4 NS (BIT) ×1 IMPLANT
BIT DRILL STD 2.0MM (DRILL) IMPLANT
BLADE SAW SGTL 13X75X1.27 (BLADE) ×1 IMPLANT
BNDG CMPR 9X6 STRL LF SNTH (GAUZE/BANDAGES/DRESSINGS) ×2
BNDG COHESIVE 4X5 TAN STRL (GAUZE/BANDAGES/DRESSINGS) ×4 IMPLANT
BNDG COHESIVE 6X5 TAN ST LF (GAUZE/BANDAGES/DRESSINGS) ×1 IMPLANT
BNDG COHESIVE 6X5 TAN STRL LF (GAUZE/BANDAGES/DRESSINGS) ×4 IMPLANT
BNDG ELASTIC 4X5.8 VLCR STR LF (GAUZE/BANDAGES/DRESSINGS) ×4 IMPLANT
BNDG ELASTIC 6X5.8 VLCR STR LF (GAUZE/BANDAGES/DRESSINGS) ×1 IMPLANT
BNDG ESMARK 6X9 LF (GAUZE/BANDAGES/DRESSINGS) ×3
BNDG GAUZE ELAST 4 BULKY (GAUZE/BANDAGES/DRESSINGS) ×4 IMPLANT
CANISTER SUCT 3000ML PPV (MISCELLANEOUS) ×4 IMPLANT
CHLORAPREP W/TINT 26 (MISCELLANEOUS) ×6 IMPLANT
CLAMP BLUE BAR TO PIN (EXFIX) ×2 IMPLANT
COVER BACK TABLE 60X90IN (DRAPES) ×1 IMPLANT
COVER MAYO STAND STRL (DRAPES) IMPLANT
COVER PERINEAL POST (MISCELLANEOUS) ×3 IMPLANT
COVER SURGICAL LIGHT HANDLE (MISCELLANEOUS) ×8 IMPLANT
CUFF TOURN SGL QUICK 34 (TOURNIQUET CUFF) ×3
CUFF TOURN SGL QUICK 42 (TOURNIQUET CUFF) IMPLANT
CUFF TRNQT CYL 34X4.125X (TOURNIQUET CUFF) ×3 IMPLANT
DRAPE BILATERAL LIMB T (DRAPES) ×1 IMPLANT
DRAPE C-ARM 42X72 X-RAY (DRAPES) ×4 IMPLANT
DRAPE C-ARMOR (DRAPES) ×5 IMPLANT
DRAPE HALF SHEET 40X57 (DRAPES) ×1 IMPLANT
DRAPE IMP U-DRAPE 54X76 (DRAPES) ×5 IMPLANT
DRAPE INCISE IOBAN 66X45 STRL (DRAPES) IMPLANT
DRAPE ORTHO SPLIT 77X108 STRL (DRAPES)
DRAPE STERI IOBAN 125X83 (DRAPES) ×4 IMPLANT
DRAPE SURG ORHT 6 SPLT 77X108 (DRAPES) ×3 IMPLANT
DRAPE U-SHAPE 47X51 STRL (DRAPES) ×7 IMPLANT
DRILL STANDARD 2.0MM (DRILL) ×3
DRSG ADAPTIC 3X8 NADH LF (GAUZE/BANDAGES/DRESSINGS) ×4 IMPLANT
DRSG AQUACEL AG ADV 3.5X 4 (GAUZE/BANDAGES/DRESSINGS) ×1 IMPLANT
DRSG AQUACEL AG ADV 3.5X 6 (GAUZE/BANDAGES/DRESSINGS) ×1 IMPLANT
DRSG MEPILEX BORDER 4X4 (GAUZE/BANDAGES/DRESSINGS) ×4 IMPLANT
DRSG MEPILEX BORDER 4X8 (GAUZE/BANDAGES/DRESSINGS) ×4 IMPLANT
DRSG PAD ABDOMINAL 8X10 ST (GAUZE/BANDAGES/DRESSINGS) ×8 IMPLANT
DURAPREP 26ML APPLICATOR (WOUND CARE) ×4 IMPLANT
ELECT REM PT RETURN 9FT ADLT (ELECTROSURGICAL) ×3
ELECTRODE REM PT RTRN 9FT ADLT (ELECTROSURGICAL) ×3 IMPLANT
EVACUATOR 1/8 PVC DRAIN (DRAIN) IMPLANT
FACESHIELD WRAPAROUND (MASK) ×6 IMPLANT
FACESHIELD WRAPAROUND OR TEAM (MASK) ×6 IMPLANT
GAUZE SPONGE 4X4 12PLY STRL (GAUZE/BANDAGES/DRESSINGS) ×4 IMPLANT
GLOVE SRG 8 PF TXTR STRL LF DI (GLOVE) ×3 IMPLANT
GLOVE SURG ENC MOIS LTX SZ7 (GLOVE) ×8 IMPLANT
GLOVE SURG ENC MOIS LTX SZ8 (GLOVE) ×8 IMPLANT
GLOVE SURG UNDER POLY LF SZ7.5 (GLOVE) ×4 IMPLANT
GLOVE SURG UNDER POLY LF SZ8 (GLOVE)
GOWN STRL REUS W/ TWL LRG LVL3 (GOWN DISPOSABLE) ×9 IMPLANT
GOWN STRL REUS W/ TWL XL LVL3 (GOWN DISPOSABLE) IMPLANT
GOWN STRL REUS W/TWL LRG LVL3 (GOWN DISPOSABLE) ×9
GOWN STRL REUS W/TWL XL LVL3 (GOWN DISPOSABLE) ×3
GUIDEPIN VERSANAIL DSP 3.2X444 (ORTHOPEDIC DISPOSABLE SUPPLIES) ×1 IMPLANT
GUIDEWIRE 2.6X80 BEAD TIP (WIRE) IMPLANT
GUIDWIRE 2.6X80 BEAD TIP (WIRE) ×3
HALF PIN 5.0X160 (EXFIX) ×2 IMPLANT
IMMOBILIZER KNEE 20 (SOFTGOODS) ×3 IMPLANT
IMMOBILIZER KNEE 20 THIGH 36 (SOFTGOODS) IMPLANT
KIT BASIN OR (CUSTOM PROCEDURE TRAY) ×4 IMPLANT
KIT TURNOVER KIT B (KITS) ×4 IMPLANT
NAIL TIBIAL 8MMX30CM (Nail) ×1 IMPLANT
NAIL TIBIAL PHOENIX 7.5X300MM (Nail) IMPLANT
NS IRRIG 1000ML POUR BTL (IV SOLUTION) ×4 IMPLANT
PACK GENERAL/GYN (CUSTOM PROCEDURE TRAY) ×3 IMPLANT
PACK ORTHO EXTREMITY (CUSTOM PROCEDURE TRAY) ×4 IMPLANT
PACK UNIVERSAL I (CUSTOM PROCEDURE TRAY) ×5 IMPLANT
PAD ARMBOARD 7.5X6 YLW CONV (MISCELLANEOUS) ×8 IMPLANT
PAD CAST 4YDX4 CTTN HI CHSV (CAST SUPPLIES) ×3 IMPLANT
PADDING CAST COTTON 4X4 STRL (CAST SUPPLIES) ×3
PADDING CAST COTTON 6X4 STRL (CAST SUPPLIES) ×4 IMPLANT
PADDING CAST SYNTHETIC 4 (CAST SUPPLIES) ×1
PADDING CAST SYNTHETIC 4X4 STR (CAST SUPPLIES) ×3 IMPLANT
PLATE LOCK COMPR 49 4H (Plate) ×1 IMPLANT
SCREW ACECAP 30MM (Screw) ×1 IMPLANT
SCREW ACECAP 44MM (Screw) ×1 IMPLANT
SCREW LOCK 10X2.7XNS ELB (Screw) IMPLANT
SCREW LOCKING 2.7X10MM (Screw) ×12 IMPLANT
SCREW PROXIMAL DEPUY (Screw) ×6 IMPLANT
SCREW PRXML FT 40X5.5XNS LF (Screw) IMPLANT
SPONGE T-LAP 18X18 ~~LOC~~+RFID (SPONGE) ×7 IMPLANT
STAPLER VISISTAT 35W (STAPLE) ×4 IMPLANT
SUCTION FRAZIER TIP 8 FR DISP (SUCTIONS) ×6
SUCTION TUBE FRAZIER 8FR DISP (SUCTIONS) IMPLANT
SUT ETHILON 3 0 PS 1 (SUTURE) ×7 IMPLANT
SUT MNCRL AB 4-0 PS2 18 (SUTURE) ×3 IMPLANT
SUT MON AB 2-0 CT1 27 (SUTURE) ×4 IMPLANT
SUT PROLENE 3 0 PS 1 (SUTURE) ×4 IMPLANT
SUT VIC AB 0 CT1 27 (SUTURE)
SUT VIC AB 0 CT1 27XBRD ANBCTR (SUTURE) ×6 IMPLANT
SUT VIC AB 2-0 CT1 27 (SUTURE) ×6
SUT VIC AB 2-0 CT1 TAPERPNT 27 (SUTURE) ×6 IMPLANT
SUT VIC AB 3-0 SH 27 (SUTURE) ×9
SUT VIC AB 3-0 SH 27X BRD (SUTURE) IMPLANT
TOWEL GREEN STERILE (TOWEL DISPOSABLE) ×4 IMPLANT
TOWEL GREEN STERILE FF (TOWEL DISPOSABLE) ×4 IMPLANT
TUBE CONNECTING 12X1/4 (SUCTIONS) ×5 IMPLANT
UNDERPAD 30X36 HEAVY ABSORB (UNDERPADS AND DIAPERS) ×4 IMPLANT
WATER STERILE IRR 1000ML POUR (IV SOLUTION) ×4 IMPLANT
YANKAUER SUCT BULB TIP NO VENT (SUCTIONS) ×4 IMPLANT
tracker exwalker small-Ortho Room supply ×1 IMPLANT

## 2021-12-17 NOTE — Anesthesia Postprocedure Evaluation (Signed)
Anesthesia Post Note  Patient: Adyn A Aranas  Procedure(s) Performed: CLOSED REDUCTION FEMORAL SHAFT (Left) RIGHT INTRAMEDULLARY (IM) NAIL TIBIAL; RIGHT CLOSED TREATMENT OF FIBULA FRACTURE; LEFT CLOSED REDUCTION SPLINTING OF PERIPROSTHETIC  SUPRACONDYLAR FEMUR FRACTURE (Bilateral)     Patient location during evaluation: PACU Anesthesia Type: General Level of consciousness: awake Pain management: pain level controlled Vital Signs Assessment: post-procedure vital signs reviewed and stable Respiratory status: spontaneous breathing Cardiovascular status: stable Postop Assessment: no apparent nausea or vomiting Anesthetic complications: no   No notable events documented.  Last Vitals:  Vitals:   12/17/21 1440 12/17/21 1455  BP: (!) 158/102 (!) 150/99  Pulse: 76 100  Resp: 12 14  Temp: (!) 36.1 C   SpO2: 96% 97%    Last Pain:  Vitals:   12/17/21 0806  TempSrc:   PainSc: 10-Worst pain ever                 Reyn Faivre
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
## 2021-12-17 NOTE — Progress Notes (Addendum)
Subjective: Day of Surgery Procedure(s) (LRB): CLOSED REDUCTION FEMORAL SHAFT (Left) RIGHT INTRAMEDULLARY (IM) NAIL TIBIAL; RIGHT CLOSED TREATMENT OF FIBULA FRACTURE; LEFT CLOSED REDUCTION SPLINTING OF PERIPROSTHETIC  SUPRACONDYLAR FEMUR FRACTURE (Bilateral)  Gregory Murphy is back on the regular nursing floor following surgery on his right tibia today. Patient reports only minimal pain.  He reports that he is trying his best to sleep as much as possible.  Objective:   VITALS:  Temp:  [97 F (36.1 C)-98.9 F (37.2 C)] 98.5 F (36.9 C) (01/01 1953) Pulse Rate:  [76-111] 92 (01/01 1953) Resp:  [9-24] 20 (01/01 1953) BP: (139-176)/(97-128) 141/104 (01/01 1953) SpO2:  [95 %-99 %] 99 % (01/01 1953) Weight:  [51.4 kg] 51.4 kg (01/01 0207)  Gen: AAOx3, NAD  Right lower extremity: Well padded CAM boot in place (removed for exam) Dressings c/d/i 5/5 Ankle PF/DF/EHL SILT over toes No pain with passive stretch Compartments soft CR<2s  Left lower extremity: Well padded knee immobilizer in place Wiggles toes SILT over toes No pain with passive stretch Compartments soft CR<2s    LABS Recent Labs    12/16/21 1945 12/16/21 1949 12/17/21 1016  HGB 16.2 18.7* 13.6  WBC 13.4*  --   --   PLT 224  --   --    Recent Labs    12/16/21 1945 12/16/21 1949 12/17/21 1016  NA 137 139 135  K 4.5 4.6 4.7  CL 103 104 101  CO2 23  --   --   BUN 5* 7 10  CREATININE 0.74 0.70 0.60*  GLUCOSE 109* 108* 113*   Recent Labs    12/16/21 1945  INR 0.9     Assessment/Plan: Day of Surgery Procedure(s) (LRB): CLOSED REDUCTION FEMORAL SHAFT (Left) RIGHT INTRAMEDULLARY (IM) NAIL TIBIAL; RIGHT CLOSED TREATMENT OF FIBULA FRACTURE; LEFT CLOSED REDUCTION SPLINTING OF PERIPROSTHETIC  SUPRACONDYLAR FEMUR FRACTURE (Bilateral)  -pt is doing excellent POD0 following ORIF of the right tibia shaft. He is comfortable on the nursing floor and is eating well -he will need ORIF of the left supracondylar  periprosthetic femur fracture. We discussed that this is a complex trauma given his prior surgical hardware and unique anatomy. We will consult our Ortho Trauma colleagues -NPO from midnight for potential OR tomorrow for the left distal femur -PT/OT consults placed -Touchdown weightbearing RLE in CAM boot until sutures removed    Gregory Murphy 12/17/2021, 10:48 PM

## 2021-12-17 NOTE — Plan of Care (Signed)

## 2021-12-17 NOTE — H&P (Signed)
H&P Update: ? ?-History and Physical Reviewed ? ?-Patient has been re-examined ? ?-No change in the plan of care ? ?-The risks and benefits were presented and reviewed. The risks due to hardware failure/irritation, new/persistent infection, stiffness, nerve/vessel/tendon injury, nonunion/malunion, wound healing issues, development of arthritis, failure of this surgery, possibility of external fixation with delayed definitive surgery, need for further surgery, thromboembolic events, anesthesia/medical complications, amputation, death among others were discussed. The patient acknowledged the explanation, agreed to proceed with the plan and a consent was signed. ? ?Gregory Murphy ? ?

## 2021-12-17 NOTE — Anesthesia Preprocedure Evaluation (Signed)
Anesthesia Evaluation  Patient identified by MRN, date of birth, ID band Patient awake  General Assessment Comment:History noted Dr. Chilton Si   Reviewed: Allergy & Precautions, NPO status , Patient's Chart, lab work & pertinent test results  Airway Mallampati: II  TM Distance: >3 FB     Dental   Pulmonary Current Smoker and Patient abstained from smoking.,    breath sounds clear to auscultation       Cardiovascular negative cardio ROS   Rhythm:Regular Rate:Normal     Neuro/Psych PSYCHIATRIC DISORDERS    GI/Hepatic negative GI ROS, Neg liver ROS,   Endo/Other  negative endocrine ROS  Renal/GU negative Renal ROS     Musculoskeletal   Abdominal   Peds  Hematology   Anesthesia Other Findings   Reproductive/Obstetrics                             Anesthesia Physical Anesthesia Plan  ASA: 2 and emergent  Anesthesia Plan: General   Post-op Pain Management:    Induction: Intravenous  PONV Risk Score and Plan: Ondansetron, Dexamethasone and Midazolam  Airway Management Planned: Oral ETT  Additional Equipment:   Intra-op Plan:   Post-operative Plan: Extubation in OR  Informed Consent: I have reviewed the patients History and Physical, chart, labs and discussed the procedure including the risks, benefits and alternatives for the proposed anesthesia with the patient or authorized representative who has indicated his/her understanding and acceptance.     Dental advisory given  Plan Discussed with: Anesthesiologist and CRNA  Anesthesia Plan Comments:         Anesthesia Quick Evaluation

## 2021-12-17 NOTE — Progress Notes (Addendum)
Subjective: Day of Surgery Procedure(s) (LRB): OPEN REDUCTION INTERNAL FIXATION (ORIF) DISTAL FEMUR FRACTURE (Left) INTRAMEDULLARY (IM) NAIL TIBIAL (Right)  Patient reports pain as mild.  He is ready to proceed with surgery and is now fully sober.  Objective:   VITALS:  Temp:  [98.2 F (36.8 C)-99 F (37.2 C)] 98.9 F (37.2 C) (01/01 0753) Pulse Rate:  [85-136] 85 (01/01 0753) Resp:  [9-21] 17 (01/01 0753) BP: (138-169)/(90-128) 169/97 (01/01 0753) SpO2:  [95 %-99 %] 96 % (01/01 0753) Weight:  [51.4 kg] 51.4 kg (01/01 0207)  Gen: AAOx3, NAD  Right lower extremity: Well padded short leg splint in place Wiggles toes SILT over toes No pain with passive stretch Compartments soft CR<2s  Left lower extremity: Well padded long leg splint in place Wiggles toes SILT over toes No pain with passive stretch Compartments soft CR<2s    LABS Recent Labs    12/16/21 1945 12/16/21 1949  HGB 16.2 18.7*  WBC 13.4*  --   PLT 224  --    Recent Labs    12/16/21 1945 12/16/21 1949  NA 137 139  K 4.5 4.6  CL 103 104  CO2 23  --   BUN 5* 7  CREATININE 0.74 0.70  GLUCOSE 109* 108*   Recent Labs    12/16/21 1945  INR 0.9     Assessment/Plan: Day of Surgery Procedure(s) (LRB): OPEN REDUCTION INTERNAL FIXATION (ORIF) DISTAL FEMUR FRACTURE (Left) INTRAMEDULLARY (IM) NAIL TIBIAL (Right)  -case reviewed with Dr. Derrell Lolling (Trauma) and Dr. Pilar Plate (ED) - cleared by Trauma team for surgery and no other workup need -pt will require ORIF of the left distal femur (lateral supracondylar plate) and ORIF of the right tibia shaft (intramedullary nail) as well  -NPO since midnight  -hold VTE ppx since midnight -we discussed possible right leg compartment release if indicated at the time of surgery. At this time, there are no signs of compartment syndrome and the patient is completely comfortable at rest. We will avoid nerve block so that we can monitor his symptoms postop    [name] 12/17/2021, 8:48 AM

## 2021-12-17 NOTE — ED Notes (Signed)
Spoke to [employer], she is going to review pt and will let RN know if she has any questions.

## 2021-12-17 NOTE — Progress Notes (Signed)
PT Cancellation Note  Patient Details Name: Gregory Murphy MRN: [id number] DOB: 1986/08/12   Cancelled Treatment:    Reason Eval/Treat Not Completed: Active bedrest order Pt pending OR to repair fractures, will follow up post op and await increase in activity orders/WB status.   Blake Divine A Kennadee Walthour 12/17/2021, 7:28 AM [place], PT, DPT Acute Rehabilitation Services Pager [phone] Office [phone]

## 2021-12-17 NOTE — Transfer of Care (Signed)
Immediate Anesthesia Transfer of Care Note  Patient: Gregory Murphy  Procedure(s) Performed: CLOSED REDUCTION FEMORAL SHAFT (Left) RIGHT INTRAMEDULLARY (IM) NAIL TIBIAL; RIGHT CLOSED TREATMENT OF FIBULA FRACTURE; LEFT CLOSED REDUCTION SPLINTING OF PERIPROSTHETIC  SUPRACONDYLAR FEMUR FRACTURE (Bilateral)  Patient Location: PACU  Anesthesia Type:General  Level of Consciousness: drowsy  Airway & Oxygen Therapy: Patient Spontanous Breathing and Patient connected to nasal cannula oxygen  Post-op Assessment: Report given to RN and Post -op Vital signs reviewed and stable  Post vital signs: Reviewed and stable  Last Vitals:  Vitals Value Taken Time  BP 158/102 12/17/21 1440  Temp    Pulse 70 12/17/21 1445  Resp 17 12/17/21 1445  SpO2 99 % 12/17/21 1445  Vitals shown include unvalidated device data.  Last Pain:  Vitals:   12/17/21 0806  TempSrc:   PainSc: 10-Worst pain ever         Complications: No notable events documented.

## 2021-12-17 NOTE — Anesthesia Procedure Notes (Signed)
Procedure Name: Intubation Date/Time: 12/17/2021 9:40 AM Performed by: Imagene Riches, CRNA Pre-anesthesia Checklist: Patient identified, Emergency Drugs available, Suction available and Patient being monitored Patient Re-evaluated:Patient Re-evaluated prior to induction Oxygen Delivery Method: Circle System Utilized Preoxygenation: Pre-oxygenation with 100% oxygen Induction Type: IV induction Ventilation: Mask ventilation without difficulty Laryngoscope Size: Miller and 2 Grade View: Grade I Tube type: Oral Tube size: 7.5 mm Number of attempts: 1 Airway Equipment and Method: Stylet and Oral airway Placement Confirmation: ETT inserted through vocal cords under direct vision, positive ETCO2 and breath sounds checked- equal and bilateral Secured at: 22 cm Tube secured with: Tape Dental Injury: Teeth and Oropharynx as per pre-operative assessment
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
## 2021-12-18 MED ORDER — OXYCODONE HCL 5 MG PO TABS
5.0000 mg | ORAL_TABLET | ORAL | Status: DC | PRN
  Administered 2021-12-18 – 2021-12-19 (×8): 5 mg via ORAL
  Filled 2021-12-18 (×9): qty 1

## 2021-12-18 MED ORDER — ENSURE ENLIVE PO LIQD
237.0000 mL | Freq: Two times a day (BID) | ORAL | Status: DC
  Administered 2021-12-19 – 2021-12-22 (×4): 237 mL via ORAL

## 2021-12-18 MED ORDER — HYDROMORPHONE HCL 1 MG/ML IJ SOLN
0.5000 mg | INTRAMUSCULAR | Status: DC | PRN
  Administered 2021-12-18 – 2021-12-20 (×10): 0.5 mg via INTRAVENOUS
  Filled 2021-12-18 (×11): qty 0.5

## 2021-12-18 MED ORDER — ACETAMINOPHEN 325 MG PO TABS
650.0000 mg | ORAL_TABLET | Freq: Four times a day (QID) | ORAL | Status: DC | PRN
  Administered 2021-12-18: 650 mg via ORAL
  Filled 2021-12-18: qty 2

## 2021-12-18 NOTE — Plan of Care (Signed)

## 2021-12-18 NOTE — TOC Progression Note (Signed)
Transition of Care [facility]) - Progression Note    Patient Details  Name: Gregory Murphy MRN: [id number] Date of Birth: [birth year]  [facility] of [facility]) CM/SW Contact  Beckie Busing, RN Phone Number:[phone]  12/18/2021, 12:11 PM  Clinical Narrative:     Transition of Care (TOC) Screening Note   Patient Details  Name: Gregory Murphy Date of Birth: 1986/11/08   Transition of Care [facility]) CM/SW Contact:    Beckie Busing, RN Phone Number:[phone]  12/18/2021, 12:12 PM    Transition of Care Department [facility]) has reviewed patient and no TOC needs have been identified at this time. We will continue to monitor patient advancement through interdisciplinary progression rounds. If new patient transition needs arise, please place a TOC consult.          Expected Discharge Plan and Services                                                 Social Determinants of Health (SDOH) Interventions    Readmission Risk Interventions No flowsheet data found.

## 2021-12-18 NOTE — Progress Notes (Signed)
PT Cancellation Note  Patient Details Name: Gregory Murphy MRN: [id number] DOB: October 01, 1986   Cancelled Treatment:    Reason Eval/Treat Not Completed: Active bedrest order  Noted pt underwent surgery yesterday; Bedrest order is still active;  Will proceed with PT eval once activity orders are increased, and pt is off of bedrest;   Will follow up later today as time allows;  Otherwise, will follow up for PT tomorrow;   Thank you,  Gregory Murphy, PT  Acute Rehabilitation Services Pager [phone] Office [phone]    Colletta Maryland 12/18/2021, 8:38 AM
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
## 2021-12-18 NOTE — Progress Notes (Signed)
OT Cancellation Note  Patient Details Name: CARLOUS LENHART MRN: [id number] DOB: 02/01/1986   Cancelled Treatment:    Reason Eval/Treat Not Completed: Patient not medically ready. Pt with active bedrest order, MD requesting OT wait until after pt's next surgery.  Lynnda Child, OTD, OTR/L Acute Rehab [phone]   Kaylyn Lim 12/18/2021, 8:43 AM

## 2021-12-18 NOTE — Progress Notes (Signed)
Subjective: 1 Day Post-Op Procedure(s) (LRB): CLOSED REDUCTION FEMORAL SHAFT (Left) RIGHT INTRAMEDULLARY (IM) NAIL TIBIAL; RIGHT CLOSED TREATMENT OF FIBULA FRACTURE; LEFT CLOSED REDUCTION SPLINTING OF PERIPROSTHETIC  SUPRACONDYLAR FEMUR FRACTURE (Bilateral)  Mr. Shehee is on the regular nursing floor following surgery on his right tibia on 12/17/21. Patient reports only minimal pain.   Objective:   VITALS:  Temp:  [97 F (36.1 C)-98.9 F (37.2 C)] 98.9 F (37.2 C) (01/02 0700) Pulse Rate:  [76-100] 77 (01/02 0700) Resp:  [12-24] 18 (01/02 0700) BP: (141-176)/(99-112) 148/102 (01/02 0700) SpO2:  [96 %-99 %] 98 % (01/02 0700)  Gen: AAOx3, NAD  Right lower extremity: Well padded CAM boot in place (removed for exam) Dressings c/d/i 5/5 Ankle PF/DF/EHL SILT over toes No pain with passive stretch Compartments soft CR<2s  Left lower extremity: Well padded knee immobilizer in place Wiggles toes SILT over toes No pain with passive stretch Compartments soft CR<2s    LABS Recent Labs    12/16/21 1945 12/16/21 1949 12/17/21 1016  HGB 16.2 18.7* 13.6  WBC 13.4*  --   --   PLT 224  --   --    Recent Labs    12/16/21 1945 12/16/21 1949 12/17/21 1016  NA 137 139 135  K 4.5 4.6 4.7  CL 103 104 101  CO2 23  --   --   BUN 5* 7 10  CREATININE 0.74 0.70 0.60*  GLUCOSE 109* 108* 113*   Recent Labs    12/16/21 1945  INR 0.9     Assessment/Plan: 1 Day Post-Op Procedure(s) (LRB): CLOSED REDUCTION FEMORAL SHAFT (Left) RIGHT INTRAMEDULLARY (IM) NAIL TIBIAL; RIGHT CLOSED TREATMENT OF FIBULA FRACTURE; LEFT CLOSED REDUCTION SPLINTING OF PERIPROSTHETIC  SUPRACONDYLAR FEMUR FRACTURE (Bilateral)  -pt is doing excellent following ORIF of the right tibia shaft. He is comfortable on the nursing floor and is eating well -he will need ORIF of the left supracondylar periprosthetic femur fracture. We discussed that this is a complex trauma given his prior surgical hardware and unique  anatomy. We have discussed this with our Ortho Trauma colleagues and they will plan to operate on Wednesday 12/20/20. Patient is aware -NPO from midnight on 12/20/20 for ORIF left distal femur -PT/OT consults placed, weightbearing restrictions to be finalized after left surgery is complete -Touchdown weightbearing RLE in CAM boot until contralateral surgery is done    [facility] 12/18/2021, 11:11 AM

## 2021-12-19 ENCOUNTER — Encounter (HOSPITAL_COMMUNITY): Payer: Self-pay | Admitting: Orthopaedic Surgery
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
## 2021-12-19 NOTE — Progress Notes (Signed)
OT Cancellation Note  Patient Details Name: Gregory Murphy MRN: [id number] DOB: 1986/01/05   Cancelled Treatment:    Reason Eval/Treat Not Completed: Patient not medically ready. Pt has active bedrest order, awaiting surgery for L femur on 12/20/21. Will follow up for OT evaluation as appropriate.  Alfonzo Beers, OTD, OTR/L Acute Rehab [phone]  Mayer Masker 12/19/2021, 7:19 AM

## 2021-12-19 NOTE — Progress Notes (Signed)
PT Cancellation Note  Patient Details Name: Gregory Murphy MRN: [id number] DOB: 1986-09-10   Cancelled Treatment:    Reason Eval/Treat Not Completed: Active bedrest order Pt s/p surgery on Right tibia 12/18/21 and plan for surgery on left femur 12/20/20. Continues to have bedrest orders. Per ortho note, TDWB RLE in CAM boot and will await WB status on LLE post surgery. Will follow with PT eval once activity orders are updated.   Blake Divine A Ramya Vanbergen 12/19/2021, 7:15 AM [place], PT, DPT Acute Rehabilitation Services Pager [phone] Office [phone]

## 2021-12-19 NOTE — Progress Notes (Signed)
Subjective: 2 Days Post-Op Procedure(s) (LRB): CLOSED REDUCTION FEMORAL SHAFT (Left) RIGHT INTRAMEDULLARY (IM) NAIL TIBIAL; RIGHT CLOSED TREATMENT OF FIBULA FRACTURE; LEFT CLOSED REDUCTION SPLINTING OF PERIPROSTHETIC  SUPRACONDYLAR FEMUR FRACTURE (Bilateral)  [name] is on the regular nursing floor following surgery on his right tibia on 12/17/21. Patient continues to report only minimal pain.   Objective:   VITALS:  Temp:  [98.1 F (36.7 C)-99.2 F (37.3 C)] 98.5 F (36.9 C) (01/03 0807) Pulse Rate:  [62-93] 62 (01/03 0807) Resp:  [17-18] 18 (01/03 0807) BP: (143-155)/(89-115) 143/89 (01/03 0807) SpO2:  [98 %-99 %] 98 % (01/03 0807)  Gen: AAOx3, NAD  Right lower extremity: Well padded CAM boot in place (removed for exam) Dressings c/d/i 5/5 Ankle PF/DF/EHL SILT over toes No pain with passive stretch Compartments soft CR<2s  Left lower extremity: Well padded knee immobilizer in place Wiggles toes SILT over toes No pain with passive stretch Compartments soft CR<2s    LABS Recent Labs    12/16/21 1945 12/16/21 1949 12/17/21 1016  HGB 16.2 18.7* 13.6  WBC 13.4*  --   --   PLT 224  --   --    Recent Labs    12/16/21 1945 12/16/21 1949 12/17/21 1016  NA 137 139 135  K 4.5 4.6 4.7  CL 103 104 101  CO2 23  --   --   BUN 5* 7 10  CREATININE 0.74 0.70 0.60*  GLUCOSE 109* 108* 113*   Recent Labs    12/16/21 1945  INR 0.9     Assessment/Plan: 2 Days Post-Op Procedure(s) (LRB): CLOSED REDUCTION FEMORAL SHAFT (Left) RIGHT INTRAMEDULLARY (IM) NAIL TIBIAL; RIGHT CLOSED TREATMENT OF FIBULA FRACTURE; LEFT CLOSED REDUCTION SPLINTING OF PERIPROSTHETIC  SUPRACONDYLAR FEMUR FRACTURE (Bilateral)  -pt is doing excellent following ORIF of the right tibia shaft. He is comfortable on the nursing floor and is eating well. He feels left side is more painful than his recently operated right leg -he will need ORIF of the left supracondylar periprosthetic femur fracture.  We discussed that this is a complex trauma given his prior surgical hardware and unique anatomy. We have discussed this with our Ortho Trauma colleagues and Dr. Jena Gauss will plan to operate on Wednesday 12/20/20. Patient is aware -NPO from midnight on 12/20/20 for ORIF left distal femur -PT/OT consults placed, weightbearing restrictions to be finalized after left surgery is complete -Touchdown weightbearing RLE in CAM boot until contralateral surgery is done    [facility] 12/19/2021, 9:04 AM

## 2021-12-20 ENCOUNTER — Inpatient Hospital Stay (HOSPITAL_COMMUNITY): Payer: 59

## 2021-12-20 ENCOUNTER — Other Ambulatory Visit: Payer: Self-pay

## 2021-12-20 ENCOUNTER — Inpatient Hospital Stay (HOSPITAL_COMMUNITY): Payer: 59 | Admitting: Certified Registered"

## 2021-12-20 ENCOUNTER — Encounter (HOSPITAL_COMMUNITY)
Admission: EM | Disposition: A | Discharge status: Home Health | Payer: Self-pay | Source: Home / Self Care | Attending: Orthopaedic Surgery

## 2021-12-20 ENCOUNTER — Encounter (HOSPITAL_COMMUNITY): Payer: Self-pay | Admitting: Orthopaedic Surgery

## 2021-12-20 HISTORY — PX: ORIF FEMUR FRACTURE: SHX2119

## 2021-12-20 LAB — VITAMIN D 25 HYDROXY (VIT D DEFICIENCY, FRACTURES): Vit D, 25-Hydroxy: 12.06 ng/mL — ABNORMAL LOW (ref 30–100)

## 2021-12-20 SURGERY — OPEN REDUCTION INTERNAL FIXATION (ORIF) DISTAL FEMUR FRACTURE
Anesthesia: General | Site: Knee | Laterality: Left

## 2021-12-20 MED ORDER — OXYCODONE HCL 5 MG/5ML PO SOLN
5.0000 mg | Freq: Once | ORAL | Status: AC | PRN
  Administered 2021-12-20: 5 mg via ORAL

## 2021-12-20 MED ORDER — ONDANSETRON HCL 4 MG/2ML IJ SOLN: 4.0000 mg | Freq: Four times a day (QID) | INTRAMUSCULAR | Status: DC | PRN

## 2021-12-20 MED ORDER — VANCOMYCIN HCL 1000 MG IV SOLR
INTRAVENOUS | Status: DC | PRN
  Administered 2021-12-20: 1000 mg

## 2021-12-20 MED ORDER — FENTANYL CITRATE (PF) 100 MCG/2ML IJ SOLN
INTRAMUSCULAR | Status: AC
  Administered 2021-12-20: 100 ug via INTRAVENOUS
  Filled 2021-12-20: qty 2

## 2021-12-20 MED ORDER — LACTATED RINGERS IV SOLN: INTRAVENOUS | Status: DC

## 2021-12-20 MED ORDER — CHLORHEXIDINE GLUCONATE 0.12 % MT SOLN: 15.0000 mL | Freq: Once | OROMUCOSAL | Status: DC

## 2021-12-20 MED ORDER — SODIUM CHLORIDE 0.9 % IV SOLN: INTRAVENOUS | Status: DC

## 2021-12-20 MED ORDER — HYDROMORPHONE HCL 1 MG/ML IJ SOLN
INTRAMUSCULAR | Status: AC
  Filled 2021-12-20: qty 1

## 2021-12-20 MED ORDER — LIDOCAINE 2% (20 MG/ML) 5 ML SYRINGE
INTRAMUSCULAR | Status: AC
  Filled 2021-12-20: qty 5

## 2021-12-20 MED ORDER — ROCURONIUM BROMIDE 10 MG/ML (PF) SYRINGE
PREFILLED_SYRINGE | INTRAVENOUS | Status: AC
  Filled 2021-12-20: qty 10

## 2021-12-20 MED ORDER — LIDOCAINE 2% (20 MG/ML) 5 ML SYRINGE
INTRAMUSCULAR | Status: DC | PRN
  Administered 2021-12-20: 60 mg via INTRAVENOUS

## 2021-12-20 MED ORDER — ONDANSETRON HCL 4 MG/2ML IJ SOLN
INTRAMUSCULAR | Status: AC
  Filled 2021-12-20: qty 2

## 2021-12-20 MED ORDER — MIDAZOLAM HCL 2 MG/2ML IJ SOLN
INTRAMUSCULAR | Status: AC
  Filled 2021-12-20: qty 2

## 2021-12-20 MED ORDER — MIDAZOLAM HCL 2 MG/2ML IJ SOLN
INTRAMUSCULAR | Status: DC | PRN
  Administered 2021-12-20: 2 mg via INTRAVENOUS

## 2021-12-20 MED ORDER — METHOCARBAMOL 500 MG PO TABS
500.0000 mg | ORAL_TABLET | Freq: Four times a day (QID) | ORAL | Status: DC | PRN
  Administered 2021-12-20 – 2021-12-21 (×2): 500 mg via ORAL
  Filled 2021-12-20 (×2): qty 1

## 2021-12-20 MED ORDER — OXYCODONE HCL 5 MG PO TABS
5.0000 mg | ORAL_TABLET | ORAL | Status: DC | PRN
  Administered 2021-12-20: 5 mg via ORAL
  Administered 2021-12-20 – 2021-12-21 (×2): 10 mg via ORAL
  Filled 2021-12-20 (×3): qty 2

## 2021-12-20 MED ORDER — HYDROMORPHONE HCL 1 MG/ML IJ SOLN
0.2500 mg | INTRAMUSCULAR | Status: DC | PRN
  Administered 2021-12-20 (×4): 0.5 mg via INTRAVENOUS

## 2021-12-20 MED ORDER — FENTANYL CITRATE (PF) 100 MCG/2ML IJ SOLN
INTRAMUSCULAR | Status: DC | PRN
  Administered 2021-12-20: 150 ug via INTRAVENOUS
  Administered 2021-12-20 (×2): 50 ug via INTRAVENOUS

## 2021-12-20 MED ORDER — DEXMEDETOMIDINE (PRECEDEX) IN NS 20 MCG/5ML (4 MCG/ML) IV SYRINGE
PREFILLED_SYRINGE | INTRAVENOUS | Status: DC | PRN
  Administered 2021-12-20: 12 ug via INTRAVENOUS
  Administered 2021-12-20: 8 ug via INTRAVENOUS

## 2021-12-20 MED ORDER — CEFAZOLIN SODIUM-DEXTROSE 2-4 GM/100ML-% IV SOLN
2.0000 g | Freq: Three times a day (TID) | INTRAVENOUS | Status: AC
  Administered 2021-12-20 – 2021-12-21 (×3): 2 g via INTRAVENOUS
  Filled 2021-12-20 (×4): qty 100

## 2021-12-20 MED ORDER — OXYCODONE HCL 5 MG PO TABS: 5.0000 mg | ORAL_TABLET | Freq: Once | ORAL | Status: AC | PRN

## 2021-12-20 MED ORDER — METOCLOPRAMIDE HCL 5 MG/ML IJ SOLN: 5.0000 mg | Freq: Three times a day (TID) | INTRAMUSCULAR | Status: DC | PRN

## 2021-12-20 MED ORDER — FENTANYL CITRATE (PF) 250 MCG/5ML IJ SOLN
INTRAMUSCULAR | Status: AC
  Filled 2021-12-20: qty 5

## 2021-12-20 MED ORDER — PROPOFOL 10 MG/ML IV BOLUS
INTRAVENOUS | Status: AC
  Filled 2021-12-20: qty 20

## 2021-12-20 MED ORDER — 0.9 % SODIUM CHLORIDE (POUR BTL) OPTIME
TOPICAL | Status: DC | PRN
  Administered 2021-12-20: 1000 mL

## 2021-12-20 MED ORDER — VANCOMYCIN HCL 1000 MG IV SOLR
INTRAVENOUS | Status: AC
  Filled 2021-12-20: qty 20

## 2021-12-20 MED ORDER — MIDAZOLAM HCL 2 MG/2ML IJ SOLN: 2.0000 mg | Freq: Once | INTRAMUSCULAR | Status: AC

## 2021-12-20 MED ORDER — FENTANYL CITRATE (PF) 100 MCG/2ML IJ SOLN: 100.0000 ug | Freq: Once | INTRAMUSCULAR | Status: AC

## 2021-12-20 MED ORDER — ROPIVACAINE HCL 5 MG/ML IJ SOLN
INTRAMUSCULAR | Status: DC | PRN
Start: 2021-12-20 — End: 2021-12-20
  Administered 2021-12-20: 20 mL via PERINEURAL

## 2021-12-20 MED ORDER — HYDROMORPHONE HCL 1 MG/ML IJ SOLN
0.5000 mg | INTRAMUSCULAR | Status: DC | PRN
  Administered 2021-12-20 – 2021-12-21 (×4): 1 mg via INTRAVENOUS
  Filled 2021-12-20 (×4): qty 1

## 2021-12-20 MED ORDER — ONDANSETRON HCL 4 MG PO TABS: 4.0000 mg | ORAL_TABLET | Freq: Four times a day (QID) | ORAL | Status: DC | PRN

## 2021-12-20 MED ORDER — ACETAMINOPHEN 325 MG PO TABS
650.0000 mg | ORAL_TABLET | Freq: Four times a day (QID) | ORAL | Status: DC
  Administered 2021-12-20 – 2021-12-22 (×9): 650 mg via ORAL
  Filled 2021-12-20 (×9): qty 2

## 2021-12-20 MED ORDER — OXYCODONE HCL 5 MG/5ML PO SOLN
ORAL | Status: AC
  Filled 2021-12-20: qty 5

## 2021-12-20 MED ORDER — MIDAZOLAM HCL 2 MG/2ML IJ SOLN
INTRAMUSCULAR | Status: AC
  Administered 2021-12-20: 2 mg via INTRAVENOUS
  Filled 2021-12-20: qty 2

## 2021-12-20 MED ORDER — DEXAMETHASONE SODIUM PHOSPHATE 10 MG/ML IJ SOLN
INTRAMUSCULAR | Status: AC
  Filled 2021-12-20: qty 1

## 2021-12-20 MED ORDER — CEFAZOLIN SODIUM-DEXTROSE 2-4 GM/100ML-% IV SOLN
INTRAVENOUS | Status: AC
  Filled 2021-12-20: qty 100

## 2021-12-20 MED ORDER — CELECOXIB 200 MG PO CAPS
200.0000 mg | ORAL_CAPSULE | Freq: Two times a day (BID) | ORAL | Status: DC
  Administered 2021-12-20 (×2): 200 mg via ORAL
  Filled 2021-12-20 (×2): qty 1

## 2021-12-20 MED ORDER — POLYETHYLENE GLYCOL 3350 17 G PO PACK
17.0000 g | PACK | Freq: Every day | ORAL | Status: DC | PRN
  Filled 2021-12-20: qty 1

## 2021-12-20 MED ORDER — PROPOFOL 10 MG/ML IV BOLUS
INTRAVENOUS | Status: DC | PRN
Start: 2021-12-20 — End: 2021-12-20
  Administered 2021-12-20: 200 mg via INTRAVENOUS

## 2021-12-20 MED ORDER — SUGAMMADEX SODIUM 200 MG/2ML IV SOLN
INTRAVENOUS | Status: DC | PRN
  Administered 2021-12-20: 200 mg via INTRAVENOUS

## 2021-12-20 MED ORDER — CHLORHEXIDINE GLUCONATE 0.12 % MT SOLN
OROMUCOSAL | Status: AC
  Administered 2021-12-20: 15 mL
  Filled 2021-12-20: qty 15

## 2021-12-20 MED ORDER — DEXAMETHASONE SODIUM PHOSPHATE 10 MG/ML IJ SOLN
INTRAMUSCULAR | Status: DC | PRN
  Administered 2021-12-20: 5 mg via INTRAVENOUS

## 2021-12-20 MED ORDER — CEFAZOLIN SODIUM-DEXTROSE 2-4 GM/100ML-% IV SOLN
2.0000 g | Freq: Once | INTRAVENOUS | Status: AC
  Administered 2021-12-20: 2 g via INTRAVENOUS

## 2021-12-20 MED ORDER — DOCUSATE SODIUM 100 MG PO CAPS
100.0000 mg | ORAL_CAPSULE | Freq: Two times a day (BID) | ORAL | Status: DC
  Administered 2021-12-20 – 2021-12-22 (×4): 100 mg via ORAL
  Filled 2021-12-20 (×4): qty 1

## 2021-12-20 MED ORDER — DEXMEDETOMIDINE (PRECEDEX) IN NS 20 MCG/5ML (4 MCG/ML) IV SYRINGE
PREFILLED_SYRINGE | INTRAVENOUS | Status: AC
  Filled 2021-12-20: qty 5

## 2021-12-20 MED ORDER — ORAL CARE MOUTH RINSE: 15.0000 mL | Freq: Once | OROMUCOSAL | Status: DC

## 2021-12-20 MED ORDER — ONDANSETRON HCL 4 MG/2ML IJ SOLN
INTRAMUSCULAR | Status: DC | PRN
Start: 2021-12-20 — End: 2021-12-20
  Administered 2021-12-20: 4 mg via INTRAVENOUS

## 2021-12-20 MED ORDER — ROCURONIUM BROMIDE 10 MG/ML (PF) SYRINGE
PREFILLED_SYRINGE | INTRAVENOUS | Status: DC | PRN
  Administered 2021-12-20: 20 mg via INTRAVENOUS
  Administered 2021-12-20: 60 mg via INTRAVENOUS

## 2021-12-20 MED ORDER — METOCLOPRAMIDE HCL 5 MG PO TABS: 5.0000 mg | ORAL_TABLET | Freq: Three times a day (TID) | ORAL | Status: DC | PRN

## 2021-12-20 MED ORDER — METHOCARBAMOL 1000 MG/10ML IJ SOLN
500.0000 mg | Freq: Four times a day (QID) | INTRAVENOUS | Status: DC | PRN
  Filled 2021-12-20: qty 5

## 2021-12-20 SURGICAL SUPPLY — 67 items
ADH SKN CLS APL DERMABOND .7 (GAUZE/BANDAGES/DRESSINGS) ×2
APL PRP STRL LF DISP 70% ISPRP (MISCELLANEOUS) ×1
BAG COUNTER SPONGE SURGICOUNT (BAG) ×3 IMPLANT
BAG SPNG CNTER NS LX DISP (BAG) ×1
BIT DRILL 4.3 (BIT) ×2
BIT DRILL 4.3X300MM (BIT) IMPLANT
BIT DRILL LONG 3.3 (BIT) ×2 IMPLANT
BIT DRILL QC 3.3X195 (BIT) ×1 IMPLANT
BLADE CLIPPER SURG (BLADE) IMPLANT
BRUSH SCRUB EZ PLAIN DRY (MISCELLANEOUS) ×6 IMPLANT
CANISTER SUCT 3000ML PPV (MISCELLANEOUS) ×3 IMPLANT
CAP LOCK NCB (Cap) ×8 IMPLANT
CHLORAPREP W/TINT 26 (MISCELLANEOUS) ×3 IMPLANT
COVER SURGICAL LIGHT HANDLE (MISCELLANEOUS) ×3 IMPLANT
DERMABOND ADVANCED (GAUZE/BANDAGES/DRESSINGS) ×2
DERMABOND ADVANCED .7 DNX12 (GAUZE/BANDAGES/DRESSINGS) IMPLANT
DRAPE C-ARM 42X72 X-RAY (DRAPES) ×3 IMPLANT
DRAPE C-ARMOR (DRAPES) ×3 IMPLANT
DRAPE HALF SHEET 40X57 (DRAPES) ×6 IMPLANT
DRAPE ORTHO SPLIT 77X108 STRL (DRAPES) ×4
DRAPE SURG 17X23 STRL (DRAPES) ×3 IMPLANT
DRAPE SURG ORHT 6 SPLT 77X108 (DRAPES) ×4 IMPLANT
DRAPE U-SHAPE 47X51 STRL (DRAPES) ×3 IMPLANT
DRESSING MEPILEX FLEX 4X4 (GAUZE/BANDAGES/DRESSINGS) IMPLANT
DRSG MEPILEX BORDER 4X8 (GAUZE/BANDAGES/DRESSINGS) ×2 IMPLANT
DRSG MEPILEX FLEX 4X4 (GAUZE/BANDAGES/DRESSINGS) ×2
DRSG PAD ABDOMINAL 8X10 ST (GAUZE/BANDAGES/DRESSINGS) ×9 IMPLANT
ELECT REM PT RETURN 9FT ADLT (ELECTROSURGICAL) ×2
ELECTRODE REM PT RTRN 9FT ADLT (ELECTROSURGICAL) ×2 IMPLANT
GLOVE SURG ENC MOIS LTX SZ6.5 (GLOVE) ×9 IMPLANT
GLOVE SURG ENC MOIS LTX SZ7.5 (GLOVE) ×12 IMPLANT
GLOVE SURG UNDER POLY LF SZ6.5 (GLOVE) ×3 IMPLANT
GLOVE SURG UNDER POLY LF SZ7.5 (GLOVE) ×3 IMPLANT
K-WIRE 2.0 (WIRE) ×2
K-WIRE FXSTD 280X2XNS SS (WIRE) ×1
KIT BASIN OR (CUSTOM PROCEDURE TRAY) ×3 IMPLANT
KIT TURNOVER KIT B (KITS) ×3 IMPLANT
KWIRE FXSTD 280X2XNS SS (WIRE) IMPLANT
NS IRRIG 1000ML POUR BTL (IV SOLUTION) ×3 IMPLANT
PACK TOTAL JOINT (CUSTOM PROCEDURE TRAY) ×3 IMPLANT
PAD ARMBOARD 7.5X6 YLW CONV (MISCELLANEOUS) ×6 IMPLANT
PAD CAST 4YDX4 CTTN HI CHSV (CAST SUPPLIES) ×2 IMPLANT
PADDING CAST COTTON 4X4 STRL (CAST SUPPLIES) ×2
PADDING CAST COTTON 6X4 STRL (CAST SUPPLIES) ×3 IMPLANT
PLATE FEM DIST NCB PP 278MM (Plate) ×1 IMPLANT
SCREW 5.0 70MM (Screw) ×2 IMPLANT
SCREW CORTICAL NCB 5.0X65 (Screw) ×1 IMPLANT
SCREW NCB 3.5X75X5X6.2XST (Screw) IMPLANT
SCREW NCB 4.0MX34M (Screw) ×1 IMPLANT
SCREW NCB 4.0MX38M (Screw) ×2 IMPLANT
SCREW NCB 4.0MX44M (Screw) ×1 IMPLANT
SCREW NCB 4.0X40MM (Screw) ×1 IMPLANT
SCREW NCB 5.0X75MM (Screw) ×4 IMPLANT
SPONGE T-LAP 18X18 ~~LOC~~+RFID (SPONGE) IMPLANT
STAPLER VISISTAT 35W (STAPLE) ×2 IMPLANT
SUCTION FRAZIER HANDLE 10FR (MISCELLANEOUS) ×2
SUCTION TUBE FRAZIER 10FR DISP (MISCELLANEOUS) ×2 IMPLANT
SUT ETHILON 3 0 PS 1 (SUTURE) ×4 IMPLANT
SUT VIC AB 0 CT1 27 (SUTURE) ×2
SUT VIC AB 0 CT1 27XBRD ANBCTR (SUTURE) IMPLANT
SUT VIC AB 1 CT1 27 (SUTURE)
SUT VIC AB 1 CT1 27XBRD ANBCTR (SUTURE) IMPLANT
SUT VIC AB 2-0 CT1 27 (SUTURE) ×2
SUT VIC AB 2-0 CT1 TAPERPNT 27 (SUTURE) ×4 IMPLANT
TOWEL GREEN STERILE (TOWEL DISPOSABLE) ×6 IMPLANT
TRAY FOLEY MTR SLVR 16FR STAT (SET/KITS/TRAYS/PACK) IMPLANT
WATER STERILE IRR 1000ML POUR (IV SOLUTION) ×6 IMPLANT
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
## 2021-12-20 NOTE — Transfer of Care (Signed)
Immediate Anesthesia Transfer of Care Note  Patient: Gregory Murphy  Procedure(s) Performed: OPEN REDUCTION INTERNAL FIXATION (ORIF) DISTAL FEMUR FRACTURE (Left: Knee)  Patient Location: PACU  Anesthesia Type:General  Level of Consciousness: awake, alert  and oriented  Airway & Oxygen Therapy: Patient Spontanous Breathing and Patient connected to face mask oxygen  Post-op Assessment: Report given to RN and Post -op Vital signs reviewed and stable  Post vital signs: Reviewed and stable  Last Vitals:  Vitals Value Taken Time  BP 133/95 12/20/21 1200  Temp    Pulse 74 12/20/21 1200  Resp 13 12/20/21 1200  SpO2 100 % 12/20/21 1200  Vitals shown include unvalidated device data.  Last Pain:  Vitals:   12/20/21 1005  TempSrc:   PainSc: 6       Patients Stated Pain Goal: 1 (12/19/21 1905)  Complications: No notable events documented.

## 2021-12-20 NOTE — Progress Notes (Signed)
PT Cancellation Note  Patient Details Name: Gregory Murphy MRN: [id number] DOB: 1986-03-08   Cancelled Treatment:    Reason Eval/Treat Not Completed: Active bedrest order;Patient not medically ready  Noting plans for return to OR with Dr Jena Gauss to address LLE fx;   Will follow up postop;   Van Clines, [place]  Acute Rehabilitation Services Pager [phone] Office [phone]    Levi Aland 12/20/2021, 7:15 AM

## 2021-12-20 NOTE — Anesthesia Preprocedure Evaluation (Signed)
Anesthesia Evaluation  Patient identified by MRN, date of birth, ID band Patient awake    Reviewed: Allergy & Precautions, H&P , NPO status , Patient's Chart, lab work & pertinent test results  Airway Mallampati: II   Neck ROM: full    Dental   Pulmonary Current Smoker and Patient abstained from smoking.,    breath sounds clear to auscultation       Cardiovascular negative cardio ROS   Rhythm:regular Rate:Normal     Neuro/Psych PSYCHIATRIC DISORDERS Anxiety Depression    GI/Hepatic   Endo/Other    Renal/GU      Musculoskeletal   Abdominal   Peds  Hematology   Anesthesia Other Findings   Reproductive/Obstetrics                             Anesthesia Physical Anesthesia Plan  ASA: 2  Anesthesia Plan: General   Post-op Pain Management: Regional block   Induction: Intravenous  PONV Risk Score and Plan: 1 and Ondansetron, Dexamethasone, Midazolam and Treatment may vary due to age or medical condition  Airway Management Planned: Oral ETT  Additional Equipment:   Intra-op Plan:   Post-operative Plan: Extubation in OR  Informed Consent: I have reviewed the patients History and Physical, chart, labs and discussed the procedure including the risks, benefits and alternatives for the proposed anesthesia with the patient or authorized representative who has indicated his/her understanding and acceptance.     Dental advisory given  Plan Discussed with: CRNA, Anesthesiologist and Surgeon  Anesthesia Plan Comments:         Anesthesia Quick Evaluation

## 2021-12-20 NOTE — Progress Notes (Signed)
Pt left unit to OR. Pt remains alert/oriented in no apparent distress. No complaints.

## 2021-12-20 NOTE — Anesthesia Procedure Notes (Addendum)
°  Anesthesia Regional Block: Adductor canal block   Pre-Anesthetic Checklist: , timeout performed,  Correct Patient, Correct Site, Correct Laterality,  Correct Procedure, Correct Position, site marked,  Risks and benefits discussed,  Surgical consent,  Pre-op evaluation,  At surgeon's request and post-op pain management  Laterality: Left  Prep: chloraprep       Needles:  Injection technique: Single-shot  Needle Type: Echogenic Needle     Needle Length: 9cm  Needle Gauge: 21     Additional Needles:   Narrative:  Start time: 12/20/2021 10:02 AM End time: 12/20/2021 10:10 AM Injection made incrementally with aspirations every 5 mL.  Performed by: Personally  Anesthesiologist: Achille Rich, MD  Additional Notes: Pt tolerated the procedure well.
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
## 2021-12-20 NOTE — Anesthesia Procedure Notes (Signed)

## 2021-12-21 DIAGNOSIS — E440 Moderate protein-calorie malnutrition: Secondary | ICD-10-CM | POA: Insufficient documentation

## 2021-12-21 DIAGNOSIS — E43 Unspecified severe protein-calorie malnutrition: Secondary | ICD-10-CM | POA: Insufficient documentation

## 2021-12-21 HISTORY — DX: Moderate protein-calorie malnutrition: E44.0

## 2021-12-21 LAB — CBC
HCT: 32.8 % — ABNORMAL LOW (ref 39.0–52.0)
Hemoglobin: 11.4 g/dL — ABNORMAL LOW (ref 13.0–17.0)
MCH: 31.8 pg (ref 26.0–34.0)
MCHC: 34.8 g/dL (ref 30.0–36.0)
MCV: 91.6 fL (ref 80.0–100.0)
Platelets: 243 10*3/uL (ref 150–400)
RBC: 3.58 MIL/uL — ABNORMAL LOW (ref 4.22–5.81)
RDW: 12.9 % (ref 11.5–15.5)
WBC: 12.7 10*3/uL — ABNORMAL HIGH (ref 4.0–10.5)
nRBC: 0 % (ref 0.0–0.2)

## 2021-12-21 LAB — BASIC METABOLIC PANEL
Anion gap: 6 (ref 5–15)
BUN: 13 mg/dL (ref 6–20)
CO2: 24 mmol/L (ref 22–32)
Calcium: 8.8 mg/dL — ABNORMAL LOW (ref 8.9–10.3)
Chloride: 103 mmol/L (ref 98–111)
Creatinine, Ser: 0.63 mg/dL (ref 0.61–1.24)
GFR, Estimated: >60 mL/min (ref >60)
Glucose, Bld: 118 mg/dL — ABNORMAL HIGH (ref 70–99)
Potassium: 4 mmol/L (ref 3.5–5.1)
Sodium: 133 mmol/L — ABNORMAL LOW (ref 135–145)

## 2021-12-21 MED ORDER — HYDRALAZINE HCL 10 MG PO TABS: 10.0000 mg | ORAL_TABLET | Freq: Four times a day (QID) | ORAL | Status: DC | PRN

## 2021-12-21 MED ORDER — VITAMIN D (ERGOCALCIFEROL) 1.25 MG (50000 UNIT) PO CAPS
50000.0000 [IU] | ORAL_CAPSULE | ORAL | Status: DC
  Administered 2021-12-21: 50000 [IU] via ORAL
  Filled 2021-12-21: qty 1

## 2021-12-21 MED ORDER — METHOCARBAMOL 500 MG PO TABS
500.0000 mg | ORAL_TABLET | Freq: Four times a day (QID) | ORAL | Status: DC
  Administered 2021-12-21 – 2021-12-22 (×5): 500 mg via ORAL
  Filled 2021-12-21 (×6): qty 1

## 2021-12-21 MED ORDER — METHOCARBAMOL 1000 MG/10ML IJ SOLN: 500.0000 mg | Freq: Four times a day (QID) | INTRAVENOUS | Status: DC

## 2021-12-21 MED ORDER — OXYCODONE HCL 5 MG PO TABS
10.0000 mg | ORAL_TABLET | ORAL | Status: DC | PRN
  Administered 2021-12-21: 15 mg via ORAL
  Filled 2021-12-21: qty 3

## 2021-12-21 MED ORDER — OXYCODONE HCL 5 MG PO TABS
5.0000 mg | ORAL_TABLET | ORAL | Status: DC | PRN
  Administered 2021-12-21 – 2021-12-22 (×2): 10 mg via ORAL
  Filled 2021-12-21 (×3): qty 2

## 2021-12-21 MED ORDER — KETOROLAC TROMETHAMINE 15 MG/ML IJ SOLN
15.0000 mg | Freq: Four times a day (QID) | INTRAMUSCULAR | Status: AC
  Administered 2021-12-21 – 2021-12-22 (×5): 15 mg via INTRAVENOUS
  Filled 2021-12-21 (×5): qty 1

## 2021-12-21 MED ORDER — HYDROMORPHONE HCL 1 MG/ML IJ SOLN
0.5000 mg | INTRAMUSCULAR | Status: DC | PRN
  Administered 2021-12-21 – 2021-12-22 (×6): 1 mg via INTRAVENOUS
  Filled 2021-12-21 (×6): qty 1

## 2021-12-21 NOTE — Progress Notes (Signed)
Subjective: 1 Day Post-Op Procedure(s) (LRB): OPEN REDUCTION INTERNAL FIXATION (ORIF) DISTAL FEMUR FRACTURE (Left)  Gregory Murphy is in the preop area for left femur fixation by Dr. Doreatha Martin today. Patient continues to report only minimal pain.   Objective:   VITALS:  Temp:  [98.1 F (36.7 C)-98.7 F (37.1 C)] 98.2 F (36.8 C) (01/05 0737) Pulse Rate:  [56-86] 86 (01/05 0737) Resp:  [9-26] 17 (01/05 0737) BP: (133-161)/(84-100) 146/88 (01/05 0737) SpO2:  [98 %-100 %] 99 % (01/05 0737)  Gen: AAOx3, NAD  Right lower extremity: Well padded CAM boot in place (removed for exam) Dressings c/d/i 5/5 Ankle PF/DF/EHL SILT over toes No pain with passive stretch Compartments soft CR<2s  Left lower extremity: Well padded knee immobilizer in place Wiggles toes SILT over toes No pain with passive stretch Compartments soft CR<2s    LABS Recent Labs    12/21/21 0322  HGB 11.4*  WBC 12.7*  PLT 243   Recent Labs    12/21/21 0322  NA 133*  K 4.0  CL 103  CO2 24  BUN 13  CREATININE 0.63  GLUCOSE 118*   No results for input(s): LABPT, INR in the last 72 hours.    Assessment/Plan: 1 Day Post-Op Procedure(s) (LRB): OPEN REDUCTION INTERNAL FIXATION (ORIF) DISTAL FEMUR FRACTURE (Left)  -pt is doing excellent following ORIF of the right tibia shaft. He is comfortable on the nursing floor and is eating well. He feels left side is more painful than his recently operated right leg -he is scheduled for ORIF of the left supracondylar periprosthetic femur fracture. We discussed that this is a complex trauma given his prior surgical hardware and unique anatomy. We have discussed this with our Ortho Trauma colleagues and Dr. Doreatha Martin will plan to operate today Wednesday 12/20/20. Patient is aware -NPO since midnight on 12/20/20 for ORIF left distal femur -PT/OT consults placed, weightbearing restrictions to be finalized after left surgery is complete -Touchdown weightbearing RLE in CAM boot  until contralateral surgery is done    [facility] 12/21/2021, 8:03 AM

## 2021-12-21 NOTE — Progress Notes (Signed)
Subjective: 1 Day Post-Op Procedure(s) (LRB): OPEN REDUCTION INTERNAL FIXATION (ORIF) DISTAL FEMUR FRACTURE (Left)  Gregory Murphy is doing well now s/p right tibia IMN and left periprosthetic supracondylar femur ORIF yesterday by Dr. Jena Gauss. Patient reports mild-mod pain.   Objective:   VITALS:  Temp:  [98.1 F (36.7 C)-98.7 F (37.1 C)] 98.2 F (36.8 C) (01/05 0737) Pulse Rate:  [56-86] 86 (01/05 0737) Resp:  [9-26] 17 (01/05 0737) BP: (133-161)/(84-100) 146/88 (01/05 0737) SpO2:  [98 %-100 %] 99 % (01/05 0737)  Gen: AAOx3, NAD  Right lower extremity: Well padded CAM boot in place (removed for exam) Dressings c/d/i 5/5 Ankle PF/DF/EHL SILT over toes No pain with passive stretch Compartments soft CR<2s  Left lower extremity: Dressings c/d/i 5/5 Ankle PF/DF/EHL SILT over toes No pain with passive stretch Compartments soft CR<2s    LABS Recent Labs    12/21/21 0322  HGB 11.4*  WBC 12.7*  PLT 243   Recent Labs    12/21/21 0322  NA 133*  K 4.0  CL 103  CO2 24  BUN 13  CREATININE 0.63  GLUCOSE 118*   No results for input(s): LABPT, INR in the last 72 hours.    Assessment/Plan: 1 Day Post-Op Procedure(s) (LRB): OPEN REDUCTION INTERNAL FIXATION (ORIF) DISTAL FEMUR FRACTURE (Left)  -pt is doing excellent following ORIF of BLE. He is comfortable on the nursing floor and is eating well. He feels left side is more painful than his right leg -WBAT RLE, WBAT for transfers LLE -Reg diet -Lovenox 40 mg daily -PT/OT consults placed -discharge home when cleared by PT    [name] 12/21/2021, 8:04 AM

## 2021-12-21 NOTE — Progress Notes (Signed)
Pt has c/o not being able to sleep through the night.

## 2021-12-21 NOTE — Progress Notes (Signed)
Orthopaedic Trauma Progress Note  SUBJECTIVE: Doing fair this morning, had a hard time sleeping through the night due to pain in the left leg.  States the right leg is doing well. No chest pain. No SOB. No nausea/vomiting. No other complaints.  No numbness or tingling to bilateral lower extremities.  Patient states he has help at home when discharged.  Asking if he can speak to the social worker today.  OBJECTIVE:  Vitals:   12/20/21 1902 12/20/21 2147  BP:  (!) 140/99  Pulse:  71  Resp:  16  Temp:  98.1 F (36.7 C)  SpO2: 100% 100%    General: Sitting up in bed, no acute distress Respiratory: No increased work of breathing.  Right lower extremity: Aquacel dressings clean, dry, intact to lower leg.  Dorsiflexion/plantarflexion intact.  Able to flex and extend the knee without significant discomfort.  Extremity warm and well-perfused.  Compartment soft compressible.  Neurovascularly intact. Left lower extremity: Dressing clean, dry, intact.  Tenderness to the thigh as expected.  Able to move the knee freely on his own although he does note some discomfort with this.  Ankle DF/PF intact.  Foot warm and well-perfused.  Neurovascularly intact  IMAGING: Stable post op imaging.   LABS:  Results for orders placed or performed during the hospital encounter of 12/16/21 (from the past 24 hour(s))  VITAMIN D 25 Hydroxy (Vit-D Deficiency, Fractures)     Status: Abnormal   Collection Time: 12/20/21  2:14 PM  Result Value Ref Range   Vit D, 25-Hydroxy 12.06 (L) 30 - 100 ng/mL  Basic metabolic panel     Status: Abnormal   Collection Time: 12/21/21  3:22 AM  Result Value Ref Range   Sodium 133 (L) 135 - 145 mmol/L   Potassium 4.0 3.5 - 5.1 mmol/L   Chloride 103 98 - 111 mmol/L   CO2 24 22 - 32 mmol/L   Glucose, Bld 118 (H) 70 - 99 mg/dL   BUN 13 6 - 20 mg/dL   Creatinine, Ser 6.72 0.61 - 1.24 mg/dL   Calcium 8.8 (L) 8.9 - 10.3 mg/dL   GFR, Estimated >09 >47 mL/min   Anion gap 6 5 - 15  CBC      Status: Abnormal   Collection Time: 12/21/21  3:22 AM  Result Value Ref Range   WBC 12.7 (H) 4.0 - 10.5 K/uL   RBC 3.58 (L) 4.22 - 5.81 MIL/uL   Hemoglobin 11.4 (L) 13.0 - 17.0 g/dL   HCT 09.6 (L) 28.3 - 66.2 %   MCV 91.6 80.0 - 100.0 fL   MCH 31.8 26.0 - 34.0 pg   MCHC 34.8 30.0 - 36.0 g/dL   RDW 94.7 65.4 - 65.0 %   Platelets 243 150 - 400 K/uL   nRBC 0.0 0.0 - 0.2 %    ASSESSMENT: Gregory Murphy is a 36 y.o. male s/p  ORIF left distal femur by Dr. Jena Gauss 12/20/2021   Open reduction internal fixation right tibia shaft fracture by Dr. Odis Hollingshead 12/17/2021   CV/Blood loss: Acute blood loss anemia, Hgb 11.4 this morning. Hemodynamically stable  PLAN: Weightbearing:  - RLE: TDWB - LLE: WBAT  ROM: - RLE: Unrestricted knee and ankle motion - LLE: Unrestricted knee and ankle motion Incisional and dressing care:  - RLE: Maintain Aquacel dressings - LLE: Plan to remove dressings tomorrow and leave incisions open to air Showering: Hold off on showering for now until LLE dressing removed Orthopedic device(s): CAM boot RLE Pain  management:  1. Tylenol 650 mg q 6 hours scheduled 2. Robaxin 500 mg q 6 hours  3. Oxycodone 5-15 mg q 4 hours PRN 4. Toradol 15 mg q6 hours x5 doses 5. Dilaudid 0.5-1 mg q 3 hours PRN VTE prophylaxis: Lovenox, SCDs ID:  Ancef 2gm post op Foley/Lines:  No foley, KVO IVFs Impediments to Fracture Healing: Vitamin D level 12, started on supplementation.  This should be continued at discharge. Dispo: PT/OT evaluation today.  I have adjusted pain medications, will continue to monitor pain control.   Follow - up plan: 2 weeks  Contact information:  Truitt Merle MD, Thyra Breed PA-C. After hours and holidays please check [email or web address] for group call information for [facility]   Thompson Caul, PA-C [phone] (office) [email or web address]

## 2021-12-21 NOTE — Anesthesia Postprocedure Evaluation (Signed)
Anesthesia Post Note  Patient: Gregory Murphy  Procedure(s) Performed: OPEN REDUCTION INTERNAL FIXATION (ORIF) DISTAL FEMUR FRACTURE (Left: Knee)     Patient location during evaluation: PACU Anesthesia Type: General Level of consciousness: awake and alert Pain management: pain level controlled Vital Signs Assessment: post-procedure vital signs reviewed and stable Respiratory status: spontaneous breathing, nonlabored ventilation, respiratory function stable and patient connected to nasal cannula oxygen Cardiovascular status: blood pressure returned to baseline and stable Postop Assessment: no apparent nausea or vomiting Anesthetic complications: no   No notable events documented.  Last Vitals:  Vitals:   12/20/21 2147 12/21/21 0737  BP: (!) 140/99 (!) 146/88  Pulse: 71 86  Resp: 16 17  Temp: 36.7 C 36.8 C  SpO2: 100% 99%    Last Pain:  Vitals:   12/21/21 0737  TempSrc: Oral  PainSc:                  Risingsun S
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
## 2021-12-22 ENCOUNTER — Other Ambulatory Visit (HOSPITAL_COMMUNITY): Payer: Self-pay

## 2021-12-22 DIAGNOSIS — S82201A Unspecified fracture of shaft of right tibia, initial encounter for closed fracture: Secondary | ICD-10-CM

## 2021-12-22 LAB — BASIC METABOLIC PANEL
Anion gap: 7 (ref 5–15)
BUN: 14 mg/dL (ref 6–20)
CO2: 26 mmol/L (ref 22–32)
Calcium: 8.6 mg/dL — ABNORMAL LOW (ref 8.9–10.3)
Chloride: 104 mmol/L (ref 98–111)
Creatinine, Ser: 0.64 mg/dL (ref 0.61–1.24)
GFR, Estimated: >60 mL/min (ref >60)
Glucose, Bld: 104 mg/dL — ABNORMAL HIGH (ref 70–99)
Potassium: 4.2 mmol/L (ref 3.5–5.1)
Sodium: 137 mmol/L (ref 135–145)

## 2021-12-22 LAB — CBC
HCT: 32.5 % — ABNORMAL LOW (ref 39.0–52.0)
Hemoglobin: 11.2 g/dL — ABNORMAL LOW (ref 13.0–17.0)
MCH: 32 pg (ref 26.0–34.0)
MCHC: 34.5 g/dL (ref 30.0–36.0)
MCV: 92.9 fL (ref 80.0–100.0)
Platelets: 279 10*3/uL (ref 150–400)
RBC: 3.5 MIL/uL — ABNORMAL LOW (ref 4.22–5.81)
RDW: 13.4 % (ref 11.5–15.5)
WBC: 10.8 10*3/uL — ABNORMAL HIGH (ref 4.0–10.5)
nRBC: 0 % (ref 0.0–0.2)

## 2021-12-22 MED ORDER — OXYCODONE HCL 5 MG PO TABS
5.0000 mg | ORAL_TABLET | ORAL | 0 refills | Status: AC | PRN
  Filled 2021-12-22: qty 30, 5d supply, fill #0

## 2021-12-22 MED ORDER — RIVAROXABAN 10 MG PO TABS
10.0000 mg | ORAL_TABLET | Freq: Every day | ORAL | 0 refills | Status: DC
  Filled 2021-12-22: qty 21, 21d supply, fill #0

## 2021-12-22 MED ORDER — DOCUSATE SODIUM 100 MG PO CAPS
100.0000 mg | ORAL_CAPSULE | Freq: Two times a day (BID) | ORAL | 0 refills | Status: AC
  Filled 2021-12-22: qty 60, 30d supply, fill #0

## 2021-12-22 MED ORDER — ONDANSETRON HCL 4 MG PO TABS
4.0000 mg | ORAL_TABLET | Freq: Three times a day (TID) | ORAL | 0 refills | Status: AC | PRN
  Filled 2021-12-22: qty 15, 5d supply, fill #0

## 2021-12-22 MED ORDER — ASPIRIN 325 MG PO TBEC
325.0000 mg | DELAYED_RELEASE_TABLET | Freq: Two times a day (BID) | ORAL | 0 refills | Status: AC
  Filled 2021-12-22: qty 42, 21d supply, fill #0

## 2021-12-22 NOTE — Discharge Instructions (Addendum)
Orthopaedic Trauma Service Discharge Instructions   General Discharge Instructions  WEIGHT BEARING STATUS: - Right Leg: Touchdown weightbearing in CAM boot - Left leg: Weightbearing as tolerated for transfers  RANGE OF MOTION/ACTIVITY: Ok for unrestricted hip and knee motion bilaterally as tolerated. Ok to come out of boot on right side when at rest to work on ankle range of motion  Wound Care: - Right Leg: Keep Aquacel dressings in place until follow-up. Okay to shower and get these dressings wet - Left leg: Incisions can be left open to air if there is no drainage. Ok to shower and get incisions wet  DVT/PE prophylaxis: Xarelto  Diet: as you were eating previously.  Can use over the counter stool softeners and bowel preparations, such as Miralax, to help with bowel movements.  Narcotics can be constipating.  Be sure to drink plenty of fluids  PAIN MEDICATION USE AND EXPECTATIONS  You have likely been given narcotic medications to help control your pain.  After a traumatic event that results in an fracture (broken bone) with or without surgery, it is ok to use narcotic pain medications to help control one's pain.  We understand that everyone responds to pain differently and each individual patient will be evaluated on a regular basis for the continued need for narcotic medications. Ideally, narcotic medication use should last no more than 6-8 weeks (coinciding with fracture healing).   As a patient it is your responsibility as well to monitor narcotic medication use and report the amount and frequency you use these medications when you come to your office visit.   We would also advise that if you are using narcotic medications, you should take a dose prior to therapy to maximize you participation.  IF YOU ARE ON NARCOTIC MEDICATIONS IT IS NOT PERMISSIBLE TO OPERATE A MOTOR VEHICLE (MOTORCYCLE/CAR/TRUCK/MOPED) OR HEAVY MACHINERY DO NOT MIX NARCOTICS WITH OTHER CNS (CENTRAL NERVOUS  SYSTEM) DEPRESSANTS SUCH AS ALCOHOL   STOP SMOKING OR USING NICOTINE PRODUCTS!!!!  As discussed nicotine severely impairs your body's ability to heal surgical and traumatic wounds but also impairs bone healing.  Wounds and bone heal by forming microscopic blood vessels (angiogenesis) and nicotine is a vasoconstrictor (essentially, shrinks blood vessels).  Therefore, if vasoconstriction occurs to these microscopic blood vessels they essentially disappear and are unable to deliver necessary nutrients to the healing tissue.  This is one modifiable factor that you can do to dramatically increase your chances of healing your injury.    (This means no smoking, no nicotine gum, patches, etc)  DO NOT USE NONSTEROIDAL ANTI-INFLAMMATORY DRUGS (NSAID'S)  Using products such as Advil (ibuprofen), Aleve (naproxen), Motrin (ibuprofen) for additional pain control during fracture healing can delay and/or prevent the healing response.  If you would like to take over the counter (OTC) medication, Tylenol (acetaminophen) is ok.  However, some narcotic medications that are given for pain control contain acetaminophen as well. Therefore, you should not exceed more than 4000 mg of tylenol in a day if you do not have liver disease.  Also note that there are may OTC medicines, such as cold medicines and allergy medicines that my contain tylenol as well.  If you have any questions about medications and/or interactions please ask your doctor/PA or your pharmacist.      ICE AND ELEVATE INJURED/OPERATIVE EXTREMITY  Using ice and elevating the injured extremity above your heart can help with swelling and pain control.  Icing in a pulsatile fashion, such as 20 minutes on and  20 minutes off, can be followed.    Do not place ice directly on skin. Make sure there is a barrier between to skin and the ice pack.    Using frozen items such as frozen peas works well as the conform nicely to the are that needs to be iced.  USE AN ACE WRAP  OR TED HOSE FOR SWELLING CONTROL  In addition to icing and elevation, Ace wraps or TED hose are used to help limit and resolve swelling.  It is recommended to use Ace wraps or TED hose until you are informed to stop.    When using Ace Wraps start the wrapping distally (farthest away from the body) and wrap proximally (closer to the body)   Example: If you had surgery on your leg or thing and you do not have a splint on, start the ace wrap at the toes and work your way up to the thigh        If you had surgery on your upper extremity and do not have a splint on, start the ace wrap at your fingers and work your way up to the upper arm  IF YOU ARE IN A CAM BOOT (BLACK BOOT)  You may remove boot periodically. Perform daily dressing changes as noted below.  Wash the liner of the boot regularly and wear a sock when wearing the boot. It is recommended that you sleep in the boot until told otherwise   CALL THE OFFICE WITH ANY QUESTIONS OR CONCERNS: [phone]   VISIT OUR WEBSITE FOR ADDITIONAL INFORMATION: [email or web address]     Discharge Wound Care Instructions  Do NOT apply any ointments, solutions or lotions to pin sites or surgical wounds.  These prevent needed drainage and even though solutions like hydrogen peroxide kill bacteria, they also damage cells lining the pin sites that help fight infection.  Applying lotions or ointments can keep the wounds moist and can cause them to breakdown and open up as well. This can increase the risk for infection. When in doubt call the office.   If any drainage is noted, use one layer of adaptic, then gauze, Kerlix, and an ace wrap.  Once the incision is completely dry and without drainage, it may be left open to air out.  Showering may begin 36-48 hours later.  Cleaning gently with soap and water.

## 2021-12-22 NOTE — TOC Progression Note (Signed)
Transition of Care [facility]) - Progression Note    Patient Details  Name: Gregory Murphy MRN: [id number] Date of Birth: 1986-04-21  Transition of [facility]) CM/SW Contact  Joanne Chars, LCSW Phone Number: 12/22/2021, 3:58 PM  Clinical Narrative:   CSW asked to assist by OT, pt waiting on DME.  CSW called Freda Munro at [employer], was informed that pt has cancelled order for DME, CSW spoke to pt and father with Freda Munro on the phone, pt informed that there will be copay for wheelchair, pt and father state they will get one on their own.  RN informed.        Barriers to Discharge: No Barriers Identified  Expected Discharge Plan and Services           Expected Discharge Date: 12/22/21               DME Arranged: Gilford Rile DME Agency: [employer] Date DME Agency Contacted: 12/22/21 Time DME Agency Contacted: [phone] Representative spoke with at DME Agency: [place] ([place] Interventions    Readmission Risk Interventions No flowsheet data found.
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
## 2021-12-22 NOTE — TOC Transition Note (Addendum)
Transition of Care [facility]) - CM/SW Discharge Note   Patient Details  Name: Gregory Murphy MRN: [id number] Date of Birth: 13-Aug-1986  [facility] CM/SW Contact:  Lockie Pares, RN Phone Number: 12/22/2021, 11:38 AM   Clinical Narrative:     Spoke to patient about living arrangements, received permission to talk to stepmother. He has found a place to stay and there are no stairs there, they have ramps and 3:1. Ordered walker and wheelchair. This will be coming to room, then the patient can call for his ride home after discharge is done.   1400 spoke to stepmother ( permission granted from patient verbally) She is concerned about patient insurance ( she does not know if Monia Pouch is active) wants to start medicaid enrollment. Discussed online, but will reach out to [facility] for assistance. Secure emailed patients information to reach out to help with medicaid. Patient is going to gransfathers house. And has transportation. Final next level of care: Home/Self Care Barriers to Discharge: No Barriers Identified   Patient Goals and CMS Choice Patient states their goals for this hospitalization and ongoing recovery are:: get equipment to assist me.going home      Discharge Placement         House self care              Discharge Plan and Services                DME Arranged: Dan Humphreys DME Agency: [employer] Date DME Agency Contacted: 12/22/21 Time DME Agency Contacted: [phone] Representative spoke with at DME Agency: Maud Deed            Social Determinants of Health (SDOH) Interventions     Readmission Risk Interventions No flowsheet data found.

## 2021-12-22 NOTE — Progress Notes (Signed)
Orthopaedic Trauma Progress Note  SUBJECTIVE: Doing ok today, pain controlled at rest. Just finished working with therapies, was able to roll around hallway in wheelchair. No chest pain. No SOB. No nausea/vomiting. No other complaints.  No numbness or tingling to bilateral lower extremities. After speaking with the patient, he is unsure if he can go to this step-mom's house now. He normally lives with his uncle in a 3rd level apartment so I am unsure if this would be accessible at discharge.   OBJECTIVE:  Vitals:   12/21/21 0737 12/21/21 1503  BP: (!) 146/88 136/87  Pulse: 86 65  Resp: 17 16  Temp: 98.2 F (36.8 C) 98.4 F (36.9 C)  SpO2: 99% 99%    General: Sitting up in bedside chair, no acute distress Respiratory: No increased work of breathing.  Right lower extremity: CAM boot in place. Aquacel dressings clean, dry, intact to lower leg.  Able to flex and extend the knee without significant discomfort.  Extremity warm and well-perfused.  Compartment soft compressible.  Neurovascularly intact. Left lower extremity: Dressings removed, incisions clean, dry, intact.  Bruising medial thigh around abrasion. Tenderness to the thigh as expected.  Endorses discomfort with knee motion.  Ankle DF/PF intact.  Foot warm and well-perfused.  Neurovascularly intact  IMAGING: Stable post op imaging.   LABS:  Results for orders placed or performed during the hospital encounter of 12/16/21 (from the past 24 hour(s))  Basic metabolic panel     Status: Abnormal   Collection Time: 12/22/21  2:56 AM  Result Value Ref Range   Sodium 137 135 - 145 mmol/L   Potassium 4.2 3.5 - 5.1 mmol/L   Chloride 104 98 - 111 mmol/L   CO2 26 22 - 32 mmol/L   Glucose, Bld 104 (H) 70 - 99 mg/dL   BUN 14 6 - 20 mg/dL   Creatinine, Ser 3.84 0.61 - 1.24 mg/dL   Calcium 8.6 (L) 8.9 - 10.3 mg/dL   GFR, Estimated >66 >59 mL/min   Anion gap 7 5 - 15  CBC     Status: Abnormal   Collection Time: 12/22/21  2:56 AM  Result  Value Ref Range   WBC 10.8 (H) 4.0 - 10.5 K/uL   RBC 3.50 (L) 4.22 - 5.81 MIL/uL   Hemoglobin 11.2 (L) 13.0 - 17.0 g/dL   HCT 93.5 (L) 70.1 - 77.9 %   MCV 92.9 80.0 - 100.0 fL   MCH 32.0 26.0 - 34.0 pg   MCHC 34.5 30.0 - 36.0 g/dL   RDW 39.0 30.0 - 92.3 %   Platelets 279 150 - 400 K/uL   nRBC 0.0 0.0 - 0.2 %    ASSESSMENT: Gregory Murphy is a 36 y.o. male s/p  ORIF left distal femur by Dr. Jena Gauss 12/20/2021   Open reduction internal fixation right tibia shaft fracture by Dr. Odis Hollingshead 12/17/2021   CV/Blood loss: Acute blood loss anemia, Hgb 11.2 this morning. Hemodynamically stable  PLAN: Weightbearing:  - RLE: TDWB - LLE: WBAT for transfers ROM: - RLE: Unrestricted knee and ankle motion - LLE: Unrestricted knee and ankle motion Incisional and dressing care:  - RLE: Maintain Aquacel dressings - LLE: Ok to leave incisions open to air Showering: Ok to begin showering with assistance 12/23/20. LLE incisions and RLE Aquacel dressing may get wet.  Orthopedic device(s): CAM boot RLE when OOB Pain management:  1. Tylenol 650 mg q 6 hours scheduled 2. Robaxin 500 mg q 6 hours  3. Oxycodone 5-15  mg q 4 hours PRN 4. Toradol 15 mg q6 hours x5 doses 5. Dilaudid 0.5-1 mg q 3 hours PRN VTE prophylaxis: Lovenox, SCDs ID:  Ancef 2gm post op completed Foley/Lines:  No foley, KVO IVFs Impediments to Fracture Healing: Vitamin D level 12, started on supplementation.  This should be continued at discharge. Dispo: Therapies as tolerated, PT/OT recommending HH therapies. TOC following to assist with d/c needs. Patient stable for d/c from ortho trauma standpoint once dispo location determined Follow - up plan: 2 weeks with Dr. Jena Gauss for repeat imaging and wound check  Contact information:  Truitt Merle MD, Thyra Breed PA-C. After hours and holidays please check [email or web address] for group call information for [facility]   Thompson Caul, PA-C [phone]  (office) [email or web address]

## 2021-12-25 ENCOUNTER — Encounter (HOSPITAL_COMMUNITY): Payer: Self-pay | Admitting: Student

## 2021-12-28 ENCOUNTER — Telehealth: Payer: Self-pay

## 2021-12-28 NOTE — Telephone Encounter (Signed)
Received call from [facility] regarding home health. Or OP PT. She has called Dr Doreatha Martin office to discuss. Orders were on discharge, however not in orders themselves. She stated there is a question of him not having insurance. I encouraged her to make an appointment with [facility] ( on discharge sheet) to establish PCP for future. She was confused as to how that pertains now. I reclairified that it would be for the future, he should have a PCP.  I called D Haddix office and spoke with staff who stated they were working on the [facility] piece, they "did not see an order for it from hospital stay" but the stepmother noticed it on the discharge paperwork. They had sent it out to several home health agencies, if they were unsuccessful,  they would order OP PT.  Currently the patient is living with his grandfather. Asked if family  could get him to OP therapy, they stated he has a wheelchair. It would not be ideal, but possible. Called back to [phone] to update Stepmother, no answer and no way to leave message stating the office is handling  this.

## 2021-12-28 NOTE — Telephone Encounter (Signed)
Recalled Stepmother. She answered and explained that the office was handling this, sent out referals to [facility] and will call for OP therapy if [facility] will not be covered.She acknowledges the difficulty with the patient uninsured, and will follow up with office

## 2022-01-04 ENCOUNTER — Telehealth: Payer: Self-pay

## 2022-01-04 ENCOUNTER — Telehealth (HOSPITAL_COMMUNITY): Payer: Self-pay | Admitting: Pharmacist

## 2022-01-04 ENCOUNTER — Other Ambulatory Visit (HOSPITAL_COMMUNITY): Payer: Self-pay

## 2022-01-04 NOTE — Telephone Encounter (Signed)
Transitions of Care Pharmacy   Call attempted for a pharmacy transitions of care follow-up. Phone number not accurate. No one by this name available at this number.   Call attempt #1. No additional follow-up to be made, no other numbers to try.

## 2022-01-04 NOTE — Telephone Encounter (Signed)
Called and left a voice message regarding Mr Rison PT and what was beiing done about it. Referred her to Dr. Jena Gauss office as they were working on obtaining home health or op PT

## 2022-03-08 ENCOUNTER — Other Ambulatory Visit: Payer: Self-pay | Admitting: Orthopedic Surgery

## 2022-03-08 DIAGNOSIS — M25532 Pain in left wrist: Secondary | ICD-10-CM | POA: Diagnosis not present

## 2022-03-13 ENCOUNTER — Other Ambulatory Visit: Payer: Self-pay

## 2022-03-13 ENCOUNTER — Encounter (HOSPITAL_BASED_OUTPATIENT_CLINIC_OR_DEPARTMENT_OTHER): Payer: Self-pay | Admitting: Orthopedic Surgery

## 2022-03-21 ENCOUNTER — Ambulatory Visit (HOSPITAL_BASED_OUTPATIENT_CLINIC_OR_DEPARTMENT_OTHER)
Admission: RE | Admit: 2022-03-21 | Discharge: 2022-03-21 | Disposition: A | Discharge status: Home / Self Care | Payer: 59 | Source: Ambulatory Visit | Attending: Orthopedic Surgery | Admitting: Orthopedic Surgery

## 2022-03-21 ENCOUNTER — Encounter (HOSPITAL_BASED_OUTPATIENT_CLINIC_OR_DEPARTMENT_OTHER)
Admission: RE | Disposition: A | Discharge status: Home / Self Care | Payer: Self-pay | Source: Ambulatory Visit | Attending: Orthopedic Surgery

## 2022-03-21 ENCOUNTER — Encounter (HOSPITAL_BASED_OUTPATIENT_CLINIC_OR_DEPARTMENT_OTHER): Payer: Self-pay | Admitting: Orthopedic Surgery

## 2022-03-21 ENCOUNTER — Ambulatory Visit (HOSPITAL_BASED_OUTPATIENT_CLINIC_OR_DEPARTMENT_OTHER): Payer: 59 | Admitting: Anesthesiology

## 2022-03-21 ENCOUNTER — Other Ambulatory Visit: Payer: Self-pay

## 2022-03-21 DIAGNOSIS — M25532 Pain in left wrist: Secondary | ICD-10-CM | POA: Diagnosis not present

## 2022-03-21 DIAGNOSIS — M24132 Other articular cartilage disorders, left wrist: Secondary | ICD-10-CM | POA: Diagnosis not present

## 2022-03-21 DIAGNOSIS — R69 Illness, unspecified: Secondary | ICD-10-CM | POA: Diagnosis not present

## 2022-03-21 DIAGNOSIS — F17210 Nicotine dependence, cigarettes, uncomplicated: Secondary | ICD-10-CM | POA: Diagnosis not present

## 2022-03-21 HISTORY — PX: WRIST ARTHROSCOPY WITH CARPOMETACARPEL (CMC) ARTHROPLASTY: SHX5680

## 2022-03-21 SURGERY — WRIST ARTHROSCOPY WITH CARPOMETACARPEL (CMC) ARTHROPLASTY
Anesthesia: General | Site: Wrist | Laterality: Left

## 2022-03-21 MED ORDER — FENTANYL CITRATE (PF) 100 MCG/2ML IJ SOLN
INTRAMUSCULAR | Status: DC | PRN
  Administered 2022-03-21: 100 ug via INTRAVENOUS

## 2022-03-21 MED ORDER — POVIDONE-IODINE 10 % EX SWAB
2.0000 "application " | Freq: Once | CUTANEOUS | Status: AC
  Administered 2022-03-21: 2 via TOPICAL

## 2022-03-21 MED ORDER — LIDOCAINE 2% (20 MG/ML) 5 ML SYRINGE
INTRAMUSCULAR | Status: AC
  Filled 2022-03-21: qty 5

## 2022-03-21 MED ORDER — OXYCODONE HCL 5 MG PO TABS
5.0000 mg | ORAL_TABLET | Freq: Once | ORAL | Status: AC | PRN
  Administered 2022-03-21: 5 mg via ORAL

## 2022-03-21 MED ORDER — KETOROLAC TROMETHAMINE 30 MG/ML IJ SOLN: 30.0000 mg | Freq: Once | INTRAMUSCULAR | Status: DC | PRN

## 2022-03-21 MED ORDER — ONDANSETRON HCL 4 MG/2ML IJ SOLN
INTRAMUSCULAR | Status: DC | PRN
  Administered 2022-03-21: 4 mg via INTRAVENOUS

## 2022-03-21 MED ORDER — CEFAZOLIN SODIUM-DEXTROSE 2-4 GM/100ML-% IV SOLN
2.0000 g | INTRAVENOUS | Status: AC
  Administered 2022-03-21: 2 g via INTRAVENOUS

## 2022-03-21 MED ORDER — DEXAMETHASONE SODIUM PHOSPHATE 10 MG/ML IJ SOLN
INTRAMUSCULAR | Status: AC
  Filled 2022-03-21: qty 1

## 2022-03-21 MED ORDER — ACETAMINOPHEN 500 MG PO TABS
1000.0000 mg | ORAL_TABLET | Freq: Once | ORAL | Status: AC
Start: 2022-03-21 — End: 2022-03-21
  Administered 2022-03-21: 1000 mg via ORAL

## 2022-03-21 MED ORDER — FENTANYL CITRATE (PF) 100 MCG/2ML IJ SOLN: 25.0000 ug | INTRAMUSCULAR | Status: DC | PRN

## 2022-03-21 MED ORDER — CEFAZOLIN SODIUM-DEXTROSE 2-4 GM/100ML-% IV SOLN
INTRAVENOUS | Status: AC
  Filled 2022-03-21: qty 100

## 2022-03-21 MED ORDER — MIDAZOLAM HCL 2 MG/2ML IJ SOLN
INTRAMUSCULAR | Status: AC
  Filled 2022-03-21: qty 2

## 2022-03-21 MED ORDER — LIDOCAINE HCL (CARDIAC) PF 100 MG/5ML IV SOSY
PREFILLED_SYRINGE | INTRAVENOUS | Status: DC | PRN
  Administered 2022-03-21: 60 mg via INTRAVENOUS

## 2022-03-21 MED ORDER — EPHEDRINE 5 MG/ML INJ
INTRAVENOUS | Status: AC
  Filled 2022-03-21: qty 5

## 2022-03-21 MED ORDER — PROPOFOL 10 MG/ML IV BOLUS
INTRAVENOUS | Status: DC | PRN
  Administered 2022-03-21: 150 mg via INTRAVENOUS

## 2022-03-21 MED ORDER — BUPIVACAINE HCL (PF) 0.25 % IJ SOLN
INTRAMUSCULAR | Status: DC | PRN
  Administered 2022-03-21: 6 mL

## 2022-03-21 MED ORDER — OXYCODONE HCL 5 MG PO TABS
ORAL_TABLET | ORAL | Status: AC
  Filled 2022-03-21: qty 1

## 2022-03-21 MED ORDER — LACTATED RINGERS IV SOLN: INTRAVENOUS | Status: DC

## 2022-03-21 MED ORDER — DEXAMETHASONE SODIUM PHOSPHATE 10 MG/ML IJ SOLN
INTRAMUSCULAR | Status: DC | PRN
  Administered 2022-03-21: 5 mg via INTRAVENOUS

## 2022-03-21 MED ORDER — AMISULPRIDE (ANTIEMETIC) 5 MG/2ML IV SOLN: 10.0000 mg | Freq: Once | INTRAVENOUS | Status: DC | PRN

## 2022-03-21 MED ORDER — ONDANSETRON HCL 4 MG/2ML IJ SOLN
INTRAMUSCULAR | Status: AC
  Filled 2022-03-21: qty 2

## 2022-03-21 MED ORDER — SUCCINYLCHOLINE CHLORIDE 200 MG/10ML IV SOSY
PREFILLED_SYRINGE | INTRAVENOUS | Status: AC
  Filled 2022-03-21: qty 10

## 2022-03-21 MED ORDER — ACETAMINOPHEN 500 MG PO TABS
ORAL_TABLET | ORAL | Status: AC
  Filled 2022-03-21: qty 2

## 2022-03-21 MED ORDER — OXYCODONE-ACETAMINOPHEN 5-325 MG PO TABS: 1.0000 | ORAL_TABLET | ORAL | 0 refills | Status: AC | PRN

## 2022-03-21 MED ORDER — FENTANYL CITRATE (PF) 100 MCG/2ML IJ SOLN
INTRAMUSCULAR | Status: AC
  Filled 2022-03-21: qty 2

## 2022-03-21 MED ORDER — OXYCODONE HCL 5 MG/5ML PO SOLN: 5.0000 mg | Freq: Once | ORAL | Status: AC | PRN

## 2022-03-21 MED ORDER — MIDAZOLAM HCL 5 MG/5ML IJ SOLN
INTRAMUSCULAR | Status: DC | PRN
  Administered 2022-03-21: 2 mg via INTRAVENOUS

## 2022-03-21 MED ORDER — PHENYLEPHRINE 40 MCG/ML (10ML) SYRINGE FOR IV PUSH (FOR BLOOD PRESSURE SUPPORT)
PREFILLED_SYRINGE | INTRAVENOUS | Status: AC
  Filled 2022-03-21: qty 10

## 2022-03-21 SURGICAL SUPPLY — 57 items
BAG DECANTER FOR FLEXI CONT (MISCELLANEOUS) IMPLANT
BLADE MINI RND TIP GREEN BEAV (BLADE) IMPLANT
BLADE SURG 15 STRL LF DISP TIS (BLADE) ×2 IMPLANT
BLADE SURG 15 STRL SS (BLADE) ×2
BNDG CMPR 9X4 STRL LF SNTH (GAUZE/BANDAGES/DRESSINGS) ×1
BNDG ELASTIC 4X5.8 VLCR STR LF (GAUZE/BANDAGES/DRESSINGS) ×3 IMPLANT
BNDG ESMARK 4X9 LF (GAUZE/BANDAGES/DRESSINGS) ×3 IMPLANT
BNDG GAUZE ELAST 4 BULKY (GAUZE/BANDAGES/DRESSINGS) IMPLANT
CANISTER SUCT 1200ML W/VALVE (MISCELLANEOUS) IMPLANT
CORD BIPOLAR FORCEPS 12FT (ELECTRODE) IMPLANT
COVER BACK TABLE 60X90IN (DRAPES) ×3 IMPLANT
CUFF TOURN SGL QUICK 18X4 (TOURNIQUET CUFF) IMPLANT
DRAPE EXTREMITY T 121X128X90 (DISPOSABLE) ×3 IMPLANT
DRAPE SURG 17X23 STRL (DRAPES) ×3 IMPLANT
DURAPREP 26ML APPLICATOR (WOUND CARE) ×6 IMPLANT
GAUZE 4X4 16PLY ~~LOC~~+RFID DBL (SPONGE) IMPLANT
GAUZE SPONGE 4X4 12PLY STRL (GAUZE/BANDAGES/DRESSINGS) ×3 IMPLANT
GAUZE XEROFORM 1X8 LF (GAUZE/BANDAGES/DRESSINGS) IMPLANT
GLOVE SURG SYN 8.0 (GLOVE) ×2 IMPLANT
GLOVE SURG SYN 8.0 PF PI (GLOVE) ×2 IMPLANT
GOWN STRL REIN XL XLG (GOWN DISPOSABLE) ×6 IMPLANT
GOWN STRL REUS W/ TWL LRG LVL3 (GOWN DISPOSABLE) ×2 IMPLANT
GOWN STRL REUS W/TWL LRG LVL3 (GOWN DISPOSABLE) ×2
IV NS IRRIG 3000ML ARTHROMATIC (IV SOLUTION) ×3 IMPLANT
IV SET EXT 30 76VOL 4 MALE LL (IV SETS) ×3 IMPLANT
NDL EPIDURAL TUOHY 20GX3.5 (NEEDLE) IMPLANT
NDL SAFETY ECLIPSE 18X1.5 (NEEDLE) ×4 IMPLANT
NDL SPNL 25GX3.5 QUINCKE BL (NEEDLE) IMPLANT
NEEDLE HYPO 18GX1.5 SHARP (NEEDLE) ×4
NEEDLE HYPO 22GX1.5 SAFETY (NEEDLE) ×3 IMPLANT
NEEDLE SPNL 25GX3.5 QUINCKE BL (NEEDLE) IMPLANT
NEEDLE TUOHY 20GX3.5 (NEEDLE) IMPLANT
NS IRRIG 1000ML POUR BTL (IV SOLUTION) IMPLANT
PACK BASIN DAY SURGERY FS (CUSTOM PROCEDURE TRAY) ×3 IMPLANT
PAD CAST 4YDX4 CTTN HI CHSV (CAST SUPPLIES) ×2 IMPLANT
PADDING CAST ABS 4INX4YD NS (CAST SUPPLIES) ×1
PADDING CAST ABS COTTON 4X4 ST (CAST SUPPLIES) ×2 IMPLANT
PADDING CAST COTTON 4X4 STRL (CAST SUPPLIES) ×2
SET SM JOINT TUBING/CANN (CANNULA) ×3 IMPLANT
SHAVER DISSECTOR 3.0 (BURR) ×3 IMPLANT
SHEET MEDIUM DRAPE 40X70 STRL (DRAPES) ×3 IMPLANT
SPIKE FLUID TRANSFER (MISCELLANEOUS) IMPLANT
SPLINT PLASTER CAST XFAST 4X15 (CAST SUPPLIES) ×30 IMPLANT
SPLINT PLASTER XTRA FAST SET 4 (CAST SUPPLIES) ×15
STOCKINETTE 4X48 STRL (DRAPES) ×3 IMPLANT
SUT ETHILON 4 0 PS 2 18 (SUTURE) IMPLANT
SUT PDS AB 2-0 CT2 27 (SUTURE) IMPLANT
SUT VICRYL RAPIDE 4-0 (SUTURE) IMPLANT
SUT VICRYL RAPIDE 4/0 PS 2 (SUTURE) IMPLANT
SYR 10ML LL (SYRINGE) ×3 IMPLANT
TOWEL GREEN STERILE FF (TOWEL DISPOSABLE) ×3 IMPLANT
TRAP DIGIT (INSTRUMENTS) IMPLANT
TRAP FINGER LRG (INSTRUMENTS) IMPLANT
TUBE CONNECTING 20X1/4 (TUBING) ×3 IMPLANT
UNDERPAD 30X36 HEAVY ABSORB (UNDERPADS AND DIAPERS) ×3 IMPLANT
WAND 1.5 MICROBLATOR (SURGICAL WAND) IMPLANT
WATER STERILE IRR 1000ML POUR (IV SOLUTION) ×3 IMPLANT

## 2022-03-21 NOTE — Anesthesia Preprocedure Evaluation (Addendum)
Anesthesia Evaluation  ?Patient identified by MRN, date of birth, ID band ?Patient awake ? ? ? ?Reviewed: ?Allergy & Precautions, NPO status , Patient's Chart, lab work & pertinent test results ? ?Airway ?Mallampati: II ? ?TM Distance: >3 FB ?Neck ROM: Full ? ? ? Dental ? ?(+) Missing ?  ?Pulmonary ?Current Smoker and Patient abstained from smoking.,  ?  ?Pulmonary exam normal ? ? ? ? ? ? ? Cardiovascular ?negative cardio ROS ?Normal cardiovascular exam ? ? ?  ?Neuro/Psych ?PSYCHIATRIC DISORDERS Anxiety Depression Brain injury  ?  ? GI/Hepatic ?negative GI ROS, Neg liver ROS,   ?Endo/Other  ?negative endocrine ROS ? Renal/GU ?negative Renal ROS  ? ?  ?Musculoskeletal ?negative musculoskeletal ROS ?(+)  ? Abdominal ?  ?Peds ? Hematology ?negative hematology ROS ?(+)   ?Anesthesia Other Findings ?left wrist pain ? Reproductive/Obstetrics ? ?  ? ? ? ? ? ? ? ? ? ? ? ? ? ?  ?  ? ? ? ? ? ? ? ?Anesthesia Physical ?Anesthesia Plan ? ?ASA: 2 ? ?Anesthesia Plan: General  ? ?Post-op Pain Management:   ? ?Induction: Intravenous ? ?PONV Risk Score and Plan: 1 and Ondansetron, Dexamethasone, Midazolam and Treatment may vary due to age or medical condition ? ?Airway Management Planned: LMA ? ?Additional Equipment:  ? ?Intra-op Plan:  ? ?Post-operative Plan: Extubation in OR ? ?Informed Consent: I have reviewed the patients History and Physical, chart, labs and discussed the procedure including the risks, benefits and alternatives for the proposed anesthesia with the patient or authorized representative who has indicated his/her understanding and acceptance.  ? ? ? ?Dental advisory given ? ?Plan Discussed with: CRNA ? ?Anesthesia Plan Comments:   ? ? ? ? ? ? ?Anesthesia Quick Evaluation ? ?

## 2022-03-21 NOTE — Brief Op Note (Signed)
03/21/2022 ? ?1:19 PM ? ?PATIENT:  Gregory Murphy  36 y.o. male ? ?PRE-OPERATIVE DIAGNOSIS:  left wrist pain ? ?POST-OPERATIVE DIAGNOSIS:  left wrist pain ? ?PROCEDURE:  Procedure(s) with comments: ?left wrist arthroscopy with debridement as needed (Left) - just needs 60 mins for surgery ? ?SURGEON:  Surgeon(s) and Role: ?   Charlotte Crumb, MD - Primary ? ?PHYSICIAN ASSISTANT:  ? ?ASSISTANTS: none  ? ?ANESTHESIA:   general ? ?EBL:  none ? ?BLOOD ADMINISTERED:none ? ?DRAINS: none  ? ?LOCAL MEDICATIONS USED:  MARCAINE    ? ?SPECIMEN:  No Specimen ? ?DISPOSITION OF SPECIMEN:  N/A ? ?COUNTS:  YES ? ?TOURNIQUET:   ?Total Tourniquet Time Documented: ?Upper Arm (Left) - -35 minutes ?Total: Upper Arm (Left) - -35 minutes ? ? ?DICTATION: .Dragon Dictation ? ?PLAN OF CARE: Discharge to home after PACU ? ?PATIENT DISPOSITION:  PACU - hemodynamically stable. ?  ?Delay start of Pharmacological VTE agent (>24hrs) due to surgical blood loss or risk of bleeding: not applicable ? ?

## 2022-03-21 NOTE — Anesthesia Procedure Notes (Signed)
Procedure Name: LMA Insertion ?Date/Time: 03/21/2022 12:43 PM ?Performed by: Ronnette Hila, CRNA ?Pre-anesthesia Checklist: Patient identified, Emergency Drugs available, Suction available and Patient being monitored ?Patient Re-evaluated:Patient Re-evaluated prior to induction ?Oxygen Delivery Method: Circle system utilized ?Preoxygenation: Pre-oxygenation with 100% oxygen ?Induction Type: IV induction ?Ventilation: Mask ventilation without difficulty ?LMA: LMA inserted ?LMA Size: 3.0 ?Number of attempts: 1 ?Airway Equipment and Method: Bite block ?Placement Confirmation: positive ETCO2 ?Tube secured with: Tape ?Dental Injury: Teeth and Oropharynx as per pre-operative assessment  ? ? ? ? ?

## 2022-03-21 NOTE — Discharge Instructions (Addendum)
No tylenol until after 4pm today, if needed. ? ?Post Anesthesia Home Care Instructions ? ?Activity: ?Get plenty of rest for the remainder of the day. A responsible individual must stay with you for 24 hours following the procedure.  ?For the next 24 hours, DO NOT: ?-Drive a car ?-Advertising copywriter ?-Drink alcoholic beverages ?-Take any medication unless instructed by your physician ?-Make any legal decisions or sign important papers. ? ?Meals: ?Start with liquid foods such as gelatin or soup. Progress to regular foods as tolerated. Avoid greasy, spicy, heavy foods. If nausea and/or vomiting occur, drink only clear liquids until the nausea and/or vomiting subsides. Call your physician if vomiting continues. ? ?Special Instructions/Symptoms: ?Your throat may feel dry or sore from the anesthesia or the breathing tube placed in your throat during surgery. If this causes discomfort, gargle with warm salt water. The discomfort should disappear within 24 hours. ? ?If you had a scopolamine patch placed behind your ear for the management of post- operative nausea and/or vomiting: ? ?1. The medication in the patch is effective for 72 hours, after which it should be removed.  Wrap patch in a tissue and discard in the trash. Wash hands thoroughly with soap and water. ?2. You may remove the patch earlier than 72 hours if you experience unpleasant side effects which may include dry mouth, dizziness or visual disturbances. ?3. Avoid touching the patch. Wash your hands with soap and water after contact with the patch. ?    ?

## 2022-03-21 NOTE — Op Note (Signed)
Patient was taken the operating suite and after induction of adequate general anesthetic the left upper extremity was prepped and draped in the usual sterile fashion.  An Esmarch was used to exsanguinate the limb and the tourniquet was inflated 250 mmHg.  This point time the left upper extremities padded and placed the wrist traction tower with 15 pounds required traction across the radiocarpal joint.  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  These were both debrided using a 3.0 suction shaver and a 1.8 mm micro ablator.  We used the scope in both the ulnar and radial portals to visualize the entire joint.  1 final inspection revealed no other osteocartilaginous lesions.  Instruments removed and the portals and they were closed with 4-0 nylon.  Xeroform, 4 x 4's, and a palmar splint was applied.  The patient tolerated this procedure well went to recovery in stable fashion. ?

## 2022-03-21 NOTE — Transfer of Care (Signed)
Immediate Anesthesia Transfer of Care Note ? ?Patient: Gregory Murphy ? ?Procedure(s) Performed: left wrist arthroscopy with debridement as needed (Left: Wrist) ? ?Patient Location: PACU ? ?Anesthesia Type:General ? ?Level of Consciousness: awake, alert , oriented, drowsy and patient cooperative ? ?Airway & Oxygen Therapy: Patient Spontanous Breathing and Patient connected to face mask oxygen ? ?Post-op Assessment: Report given to RN and Post -op Vital signs reviewed and stable ? ?Post vital signs: Reviewed and stable ? ?Last Vitals:  ?Vitals Value Taken Time  ?BP 132/91 03/21/22 1326  ?Temp    ?Pulse 76 03/21/22 1327  ?Resp 16 03/21/22 1327  ?SpO2 98 % 03/21/22 1327  ?Vitals shown include unvalidated device data. ? ?Last Pain:  ?Vitals:  ? 03/21/22 0935  ?TempSrc: Oral  ?PainSc: 3   ?   ? ?Patients Stated Pain Goal: 5 (03/21/22 0935) ? ?Complications: No notable events documented. ?
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
## 2022-03-21 NOTE — Anesthesia Postprocedure Evaluation (Signed)
Anesthesia Post Note ? ?Patient: Gregory Murphy ? ?Procedure(s) Performed: left wrist arthroscopy with debridement as needed (Left: Wrist) ? ?  ? ?Patient location during evaluation: Phase II ?Anesthesia Type: General ?Level of consciousness: awake and alert ?Pain management: pain level controlled ?Vital Signs Assessment: post-procedure vital signs reviewed and stable ?Respiratory status: spontaneous breathing, nonlabored ventilation and respiratory function stable ?Cardiovascular status: blood pressure returned to baseline and stable ?Postop Assessment: no apparent nausea or vomiting ?Anesthetic complications: no ? ? ?No notable events documented. ? ?Last Vitals:  ?Vitals:  ? 03/21/22 1330 03/21/22 1345  ?BP: (!) 121/91 127/87  ?Pulse: 70 (!) 56  ?Resp: 20 10  ?Temp:    ?SpO2: 99% 99%  ?  ?Last Pain:  ?Vitals:  ? 03/21/22 1409  ?TempSrc:   ?PainSc: 3   ? ? ?  ?  ?  ?  ?  ?  ? ?Lyzbeth Genrich A. ? ? ? ? ?

## 2022-03-22 ENCOUNTER — Encounter (HOSPITAL_BASED_OUTPATIENT_CLINIC_OR_DEPARTMENT_OTHER): Payer: Self-pay | Admitting: Orthopedic Surgery

## 2022-03-26 DIAGNOSIS — M25532 Pain in left wrist: Secondary | ICD-10-CM | POA: Diagnosis not present

## 2022-05-05 IMAGING — CT CT KNEE*L* W/O CM
3 series · 14 of 33 positions shown, 17 images · non-contrast
Comparison: Left knee and left femoral films from yesterday.

CLINICAL DATA: Distal femoral fracture.

EXAM:
CT OF THE LEFT KNEE WITHOUT CONTRAST
TECHNIQUE: Multidetector CT imaging of the LEFT Knee was performed according to
the standard protocol. Multiplanar CT image reconstructions were
also generated.

[Series 5: lt knee 1.5 st · axial · 0.48mm/px · z∈[+616,+838]mm · 6 of 193 slices shown, 8 images]
[im 30/193  soft-tissue]
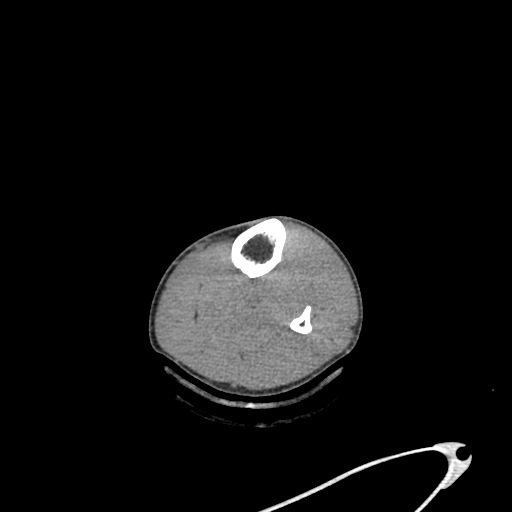
[im 30/193  bone]
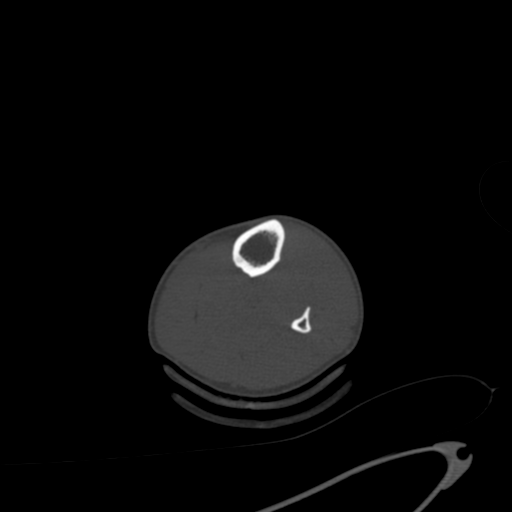
[im 60/193  bone]
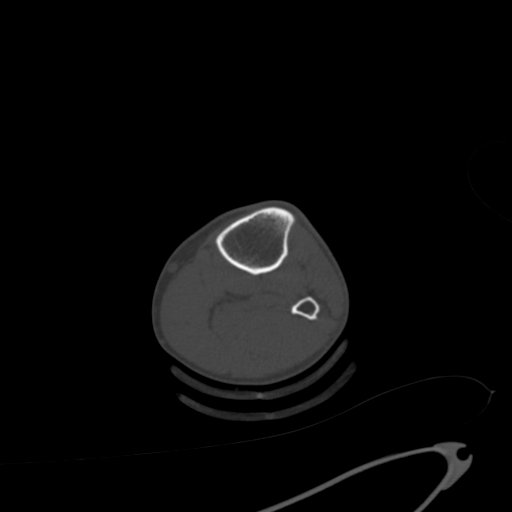
[im 89/193  bone]
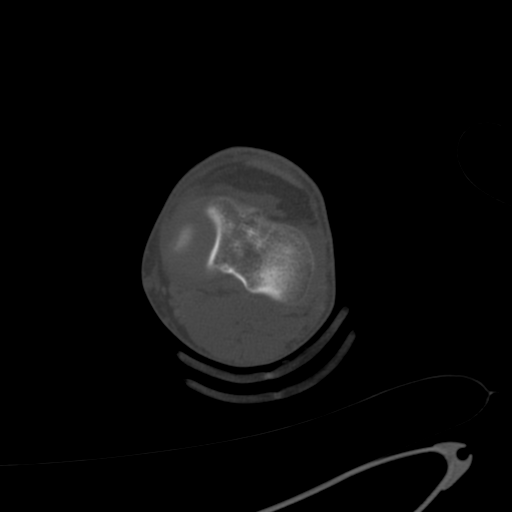
[im 119/193  bone]
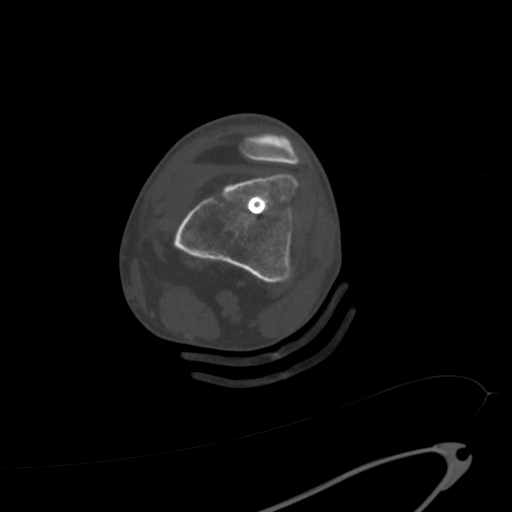
[im 148/193  soft-tissue]
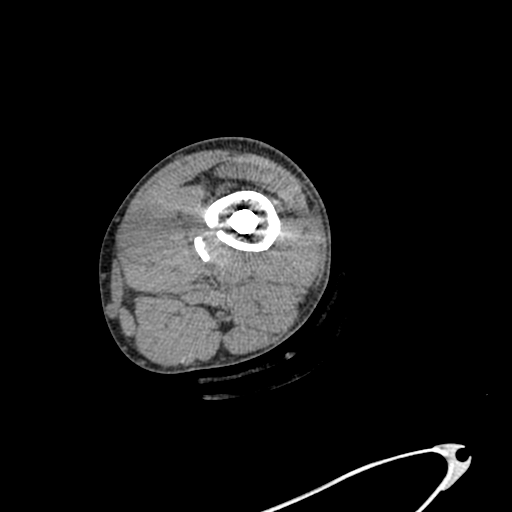
[im 148/193  bone]
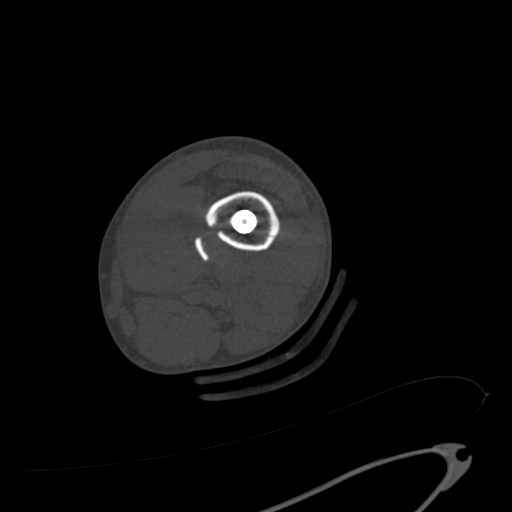
[im 178/193  bone]
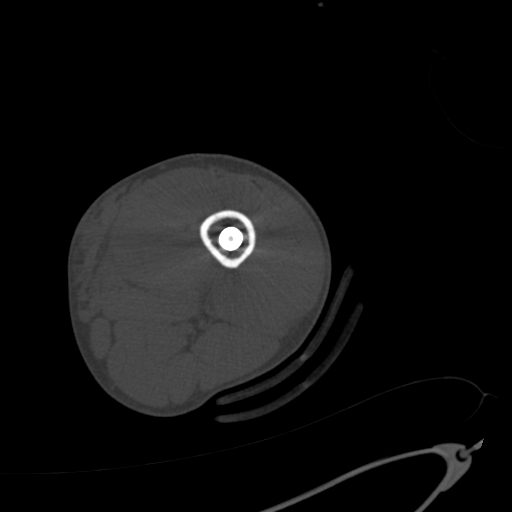

[Series 10: lt knee cor st · coronal · 0.45mm/px · 3 of 151 slices shown]
[im 31/151  bone]
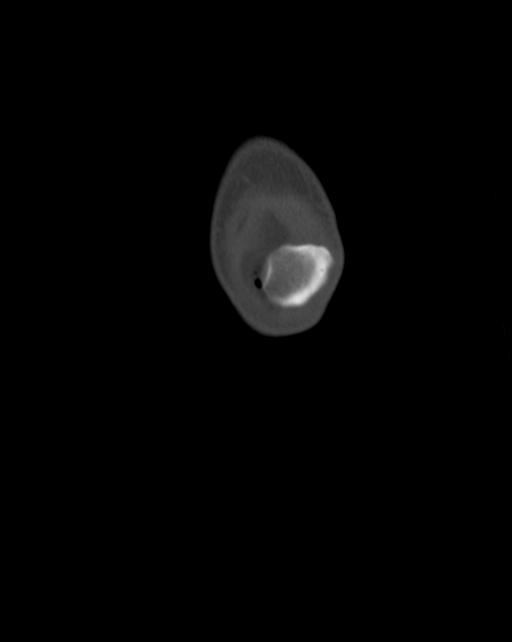
[im 61/151  bone]
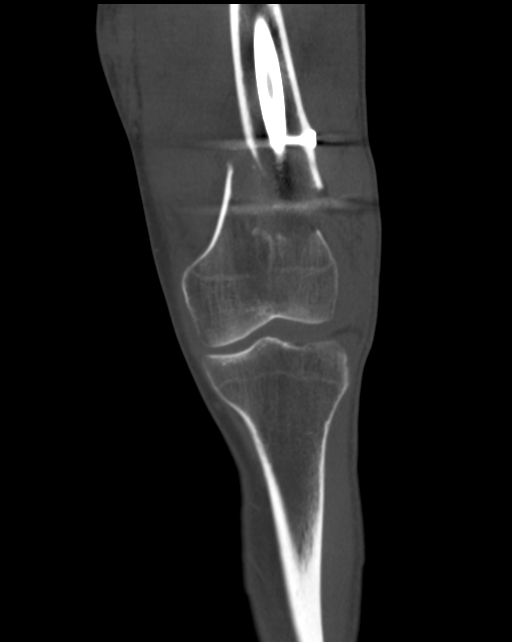
[im 91/151  bone]
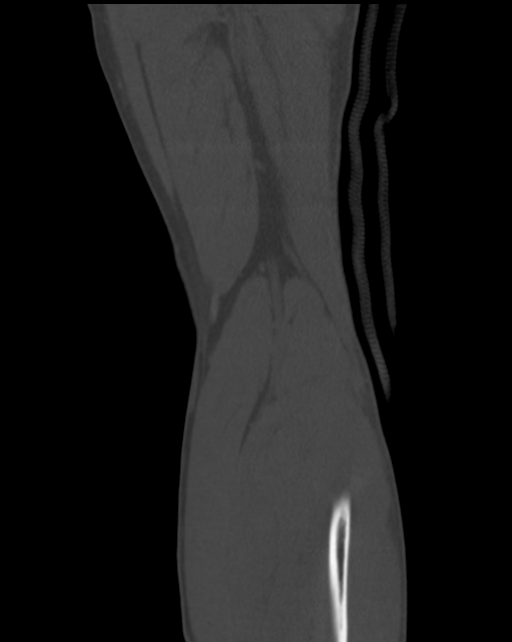

[Series 11: lt knee sag st · sagittal · 0.56mm/px · 5 of 139 slices shown, 6 images]
[im 47/139  bone]
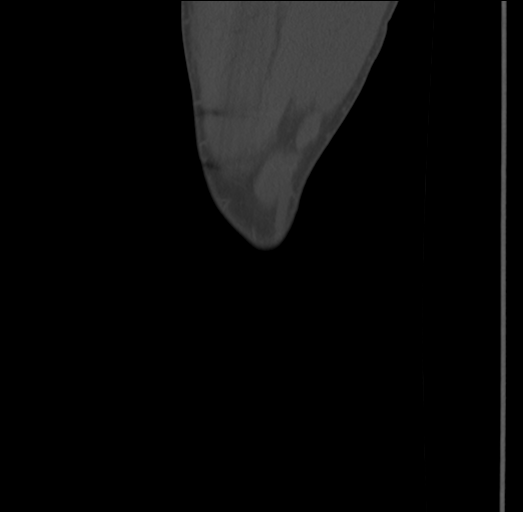
[im 58/139  bone]
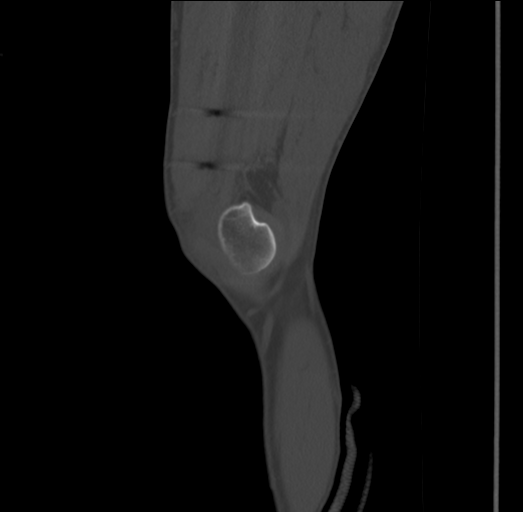
[im 70/139  soft-tissue]
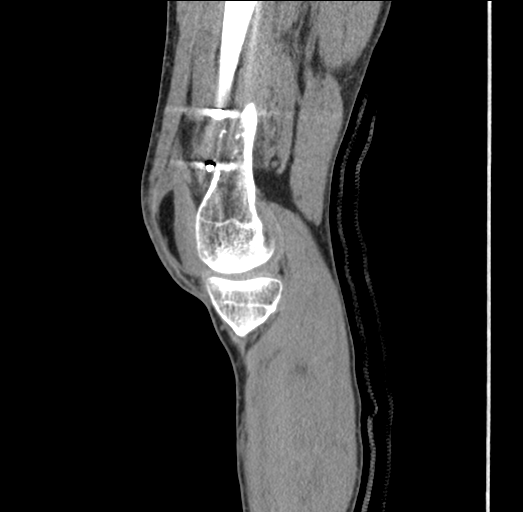
[im 70/139  bone]
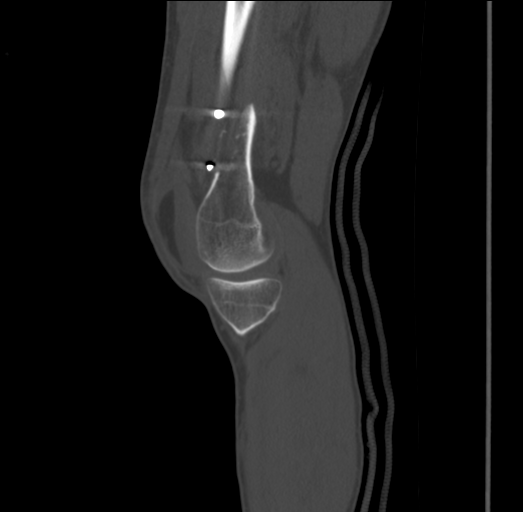
[im 81/139  bone]
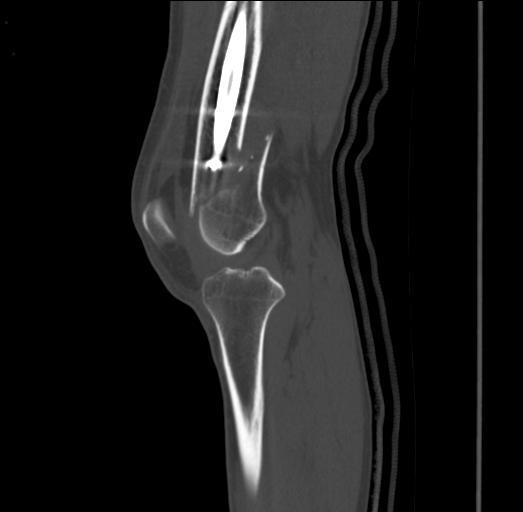
[im 93/139  bone]
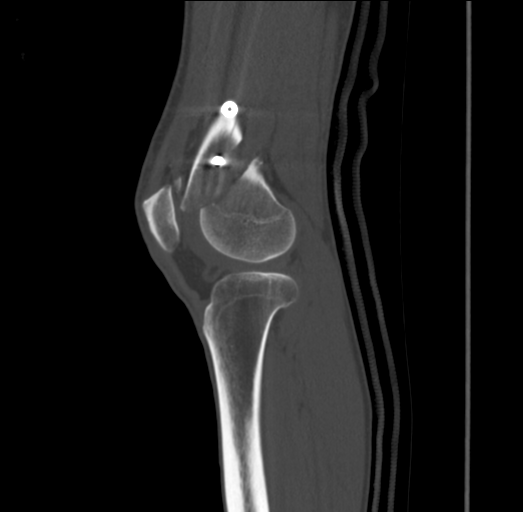

[14 of 33 positions shown; findings below may reference images not displayed]

FINDINGS: Bones/Joint/Cartilage

Normal bone mineralization. There is a partially visible femoral
intramedullary rod. Acute transverse oblique distal femoral
diametaphyseal fracture is noted at the level of the distal rod and
the more distal of 2 threaded securing screws.

There are comminution fragments in between the fracture margins with
the distal fragment posteriorly displaced up to [DATE] of a shaft width
with nearly 2 cm cephalad translation and approximally 1 cm of
medial displacement.

The most inferior fracture margin extends through the anterior
aspect of the knee joint at the level of the mid patella. There is a
comminution fragment and moderate-sized lipohemarthrosis in the
suprapatellar bursa. The fracture fragment does not originate from
the patella, which is intact. The proximal tibia and fibula are also
intact.

There is a small collection of air at the medial edge of the
patella, unclear if this is within a venous structure or
intra-articular. If intra-articular this could indicate an open
component to the fracture but there is no visible skin disruption.

Arthritic changes are not seen.

Ligaments

Suboptimally assessed by CT.  Grossly intact as far seen.

Muscles and Tendons

The muscles are very poorly evaluated in the distal thigh due to the
metal hardware causing spray artifact. There is moderate
subcutaneous stranding in the medial distal thigh. Anterior edema
seen superficially at the knee. Certainly intramuscular hematoma at
the level of the fracture is not excluded.

Soft tissues

Stranding in the medial distal thigh, anteriorly at the level of the
knee as described above. Additional findings described above.
IMPRESSION: 1. Comminuted intra-articular distal femoral fracture, with
posteromedial displacement of the distal fragment and
lipohemarthrosis. The fracture is noted at the level of the distal
extent of an old intramedullary femoral rod and the more inferior of
2 threaded securing screws.
2. A comminution fragment is seen in the suprapatellar bursa but
does not originate from the intact patella. No further fractures are
identified.
3. Poor visualization of the distal thigh musculature. Intramuscular
hematoma is not excluded. Superficial edema medially and anteriorly.
4. Small air collection at the medial edge of the patella, unknown
whether intravenous or intra-articular.

## 2022-05-08 IMAGING — RF DG FEMUR 2+V*L*
1 series · 7 of 7 positions shown · non-contrast
Comparison: 12/16/2021

CLINICAL DATA: ORIF distal left femur

EXAM:
LEFT FEMUR 2 VIEWS

[Series 1: run · 7 of 7 slices shown]
[im 1/7]
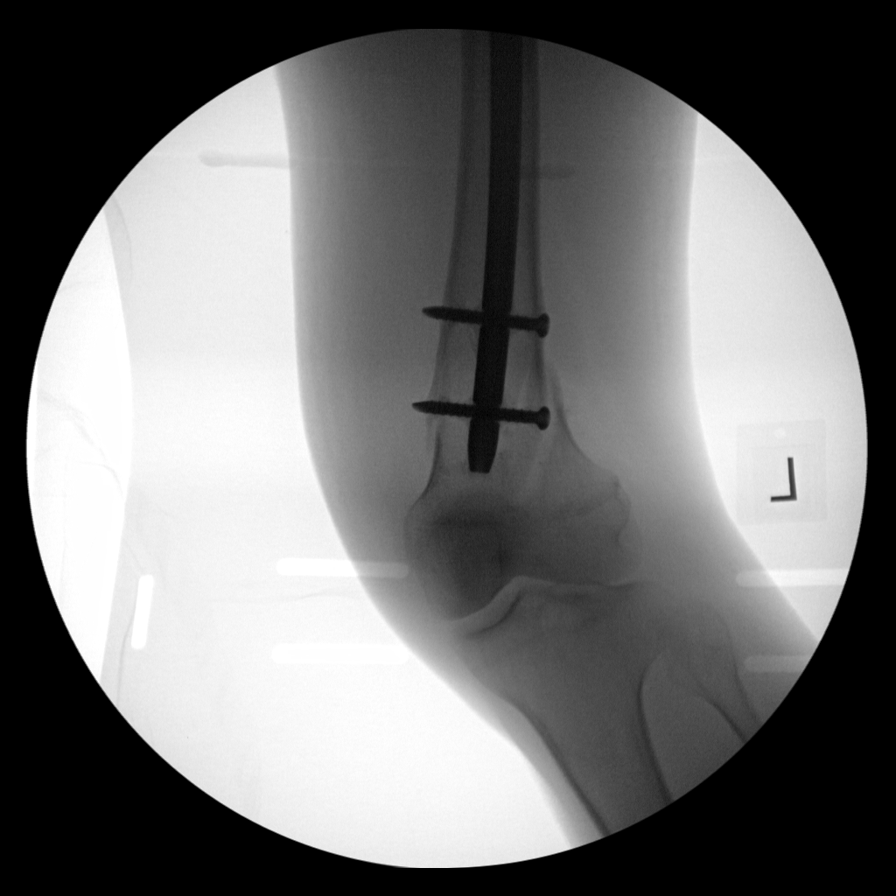
[im 2/7]
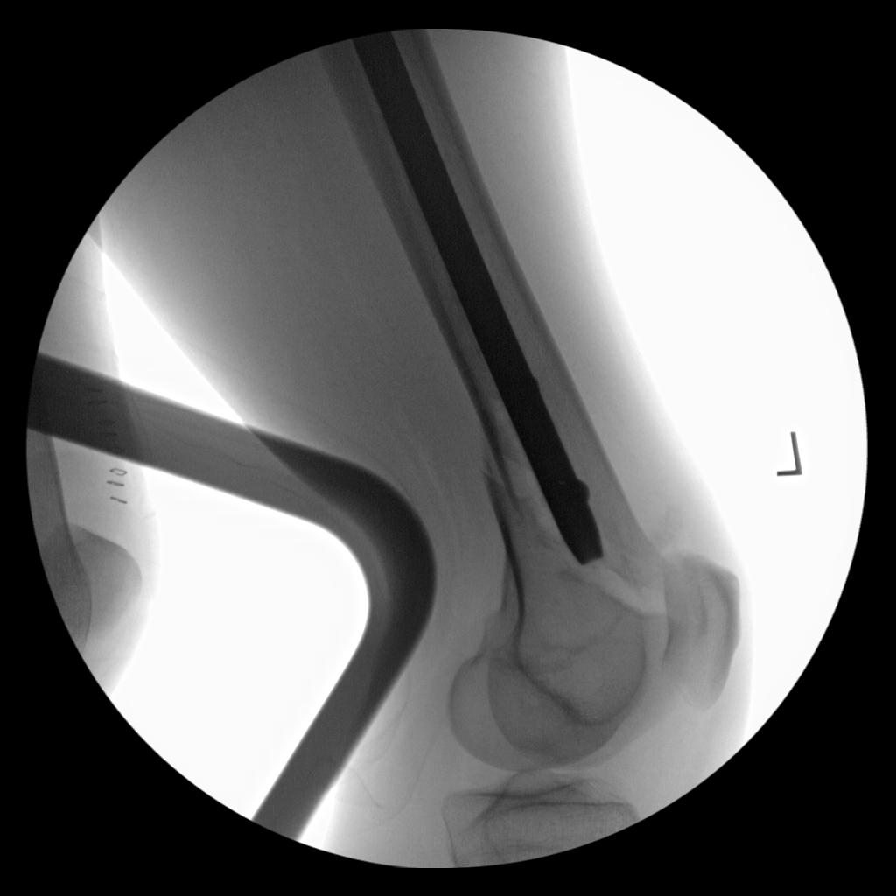
[im 3/7]
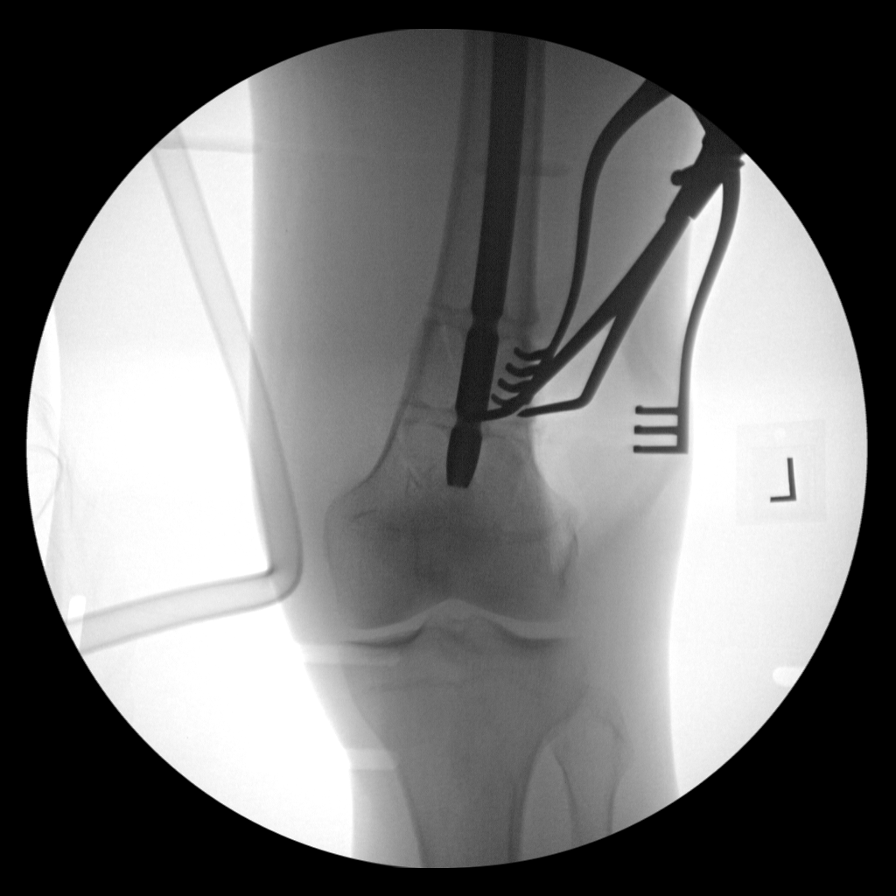
[im 4/7]
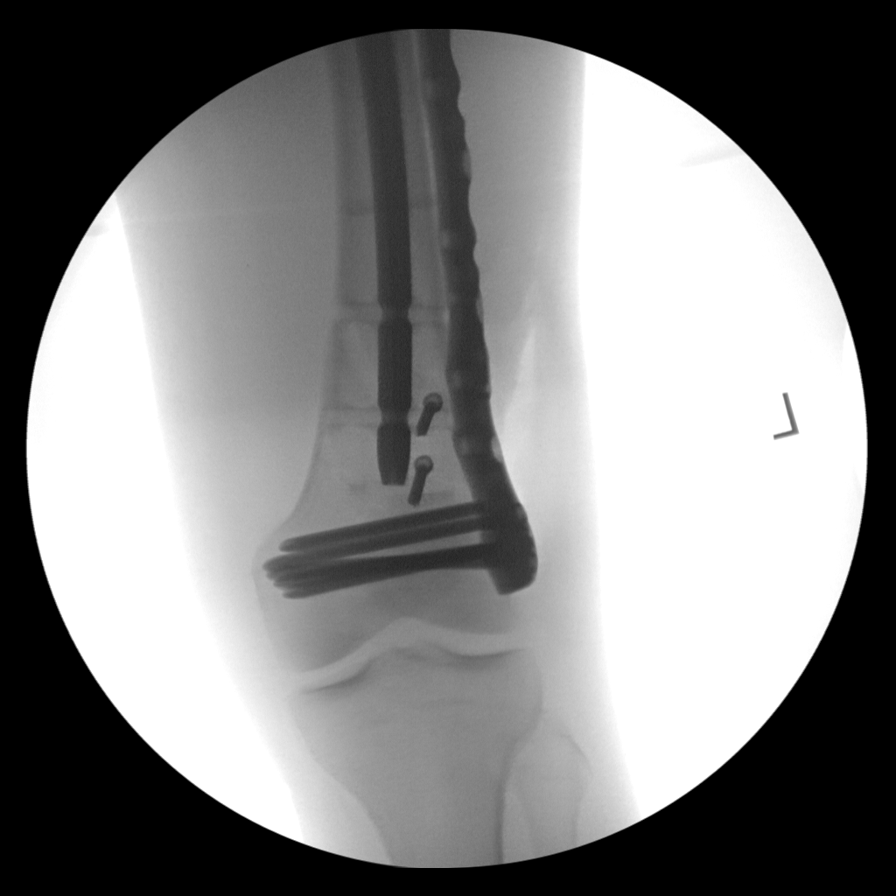
[im 5/7]
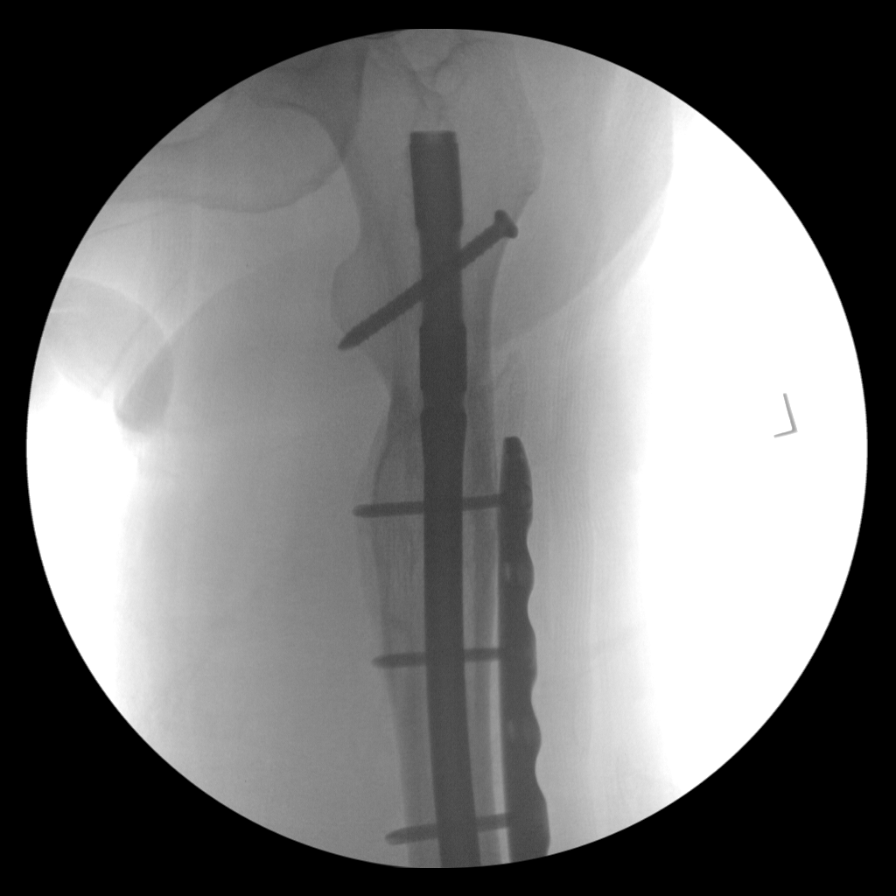
[im 6/7]
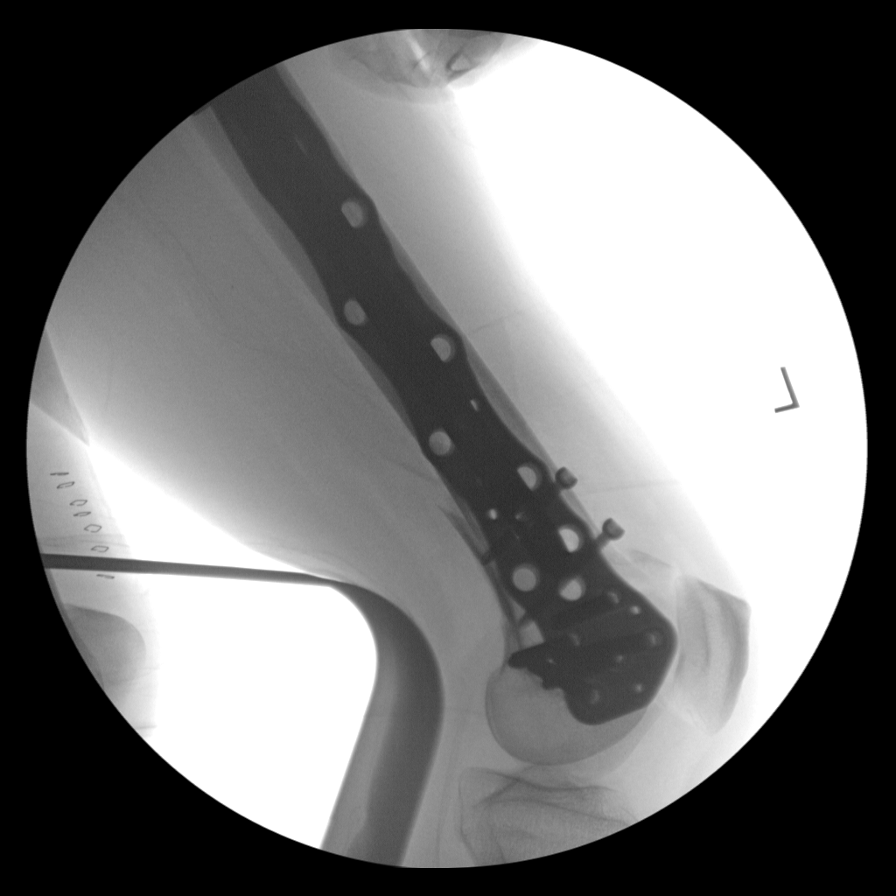
[im 7/7]
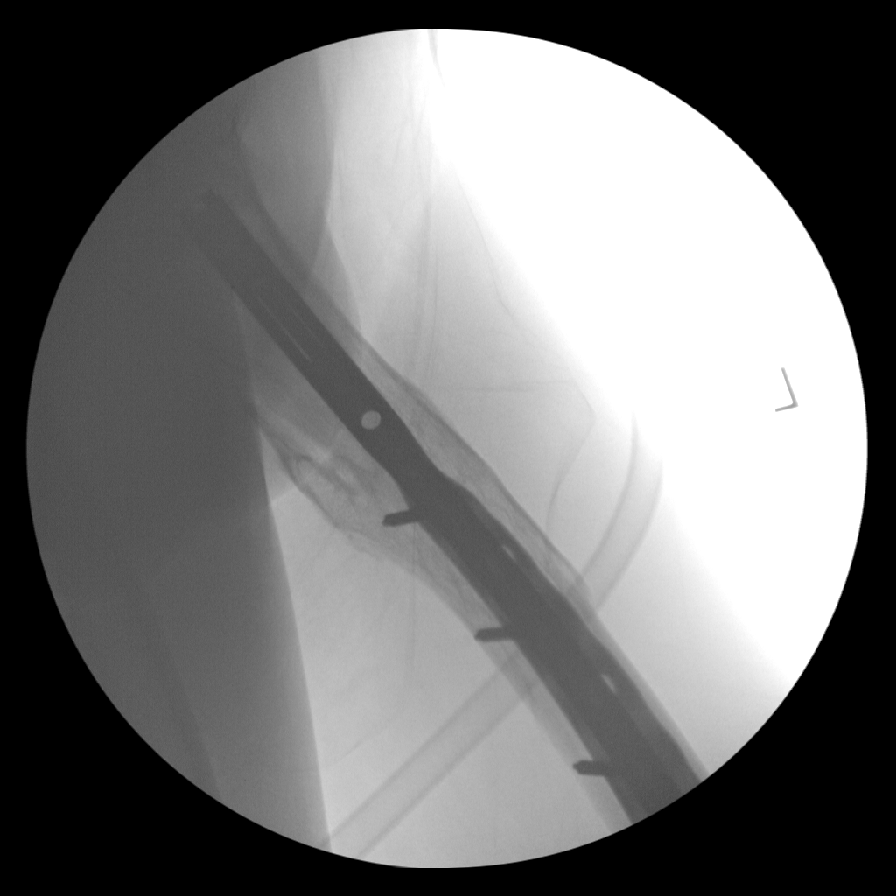

[7 of 7 positions shown; findings below may reference images not displayed]

FINDINGS: 7 C-arm images obtained of the left femur.

Fracture distal left femur is reduced and stabilized with a lateral
plate and multiple screws. Hardware in good position. Fracture
alignment is satisfactory position.

Pre-existing intramedullary rod in the distal left femur with
proximal and distal screws. Healed fracture proximal left femur.
IMPRESSION: ORIF distal left humerus fracture with lateral plate and screws.

## 2022-12-05 ENCOUNTER — Emergency Department (HOSPITAL_COMMUNITY)
Admission: EM | Admit: 2022-12-05 | Discharge: 2022-12-05 | Disposition: A | Discharge status: Home / Self Care | Payer: Managed Care, Other (non HMO) | Attending: Emergency Medicine | Admitting: Emergency Medicine

## 2022-12-05 DIAGNOSIS — T507X1A Poisoning by analeptics and opioid receptor antagonists, accidental (unintentional), initial encounter: Secondary | ICD-10-CM | POA: Diagnosis not present

## 2022-12-05 DIAGNOSIS — R000 Tachycardia, unspecified: Secondary | ICD-10-CM | POA: Diagnosis not present

## 2022-12-05 DIAGNOSIS — R69 Illness, unspecified: Secondary | ICD-10-CM | POA: Diagnosis not present

## 2022-12-05 DIAGNOSIS — Z743 Need for continuous supervision: Secondary | ICD-10-CM | POA: Diagnosis not present

## 2022-12-05 DIAGNOSIS — R9431 Abnormal electrocardiogram [ECG] [EKG]: Secondary | ICD-10-CM | POA: Diagnosis not present

## 2022-12-05 DIAGNOSIS — R0902 Hypoxemia: Secondary | ICD-10-CM | POA: Diagnosis not present

## 2022-12-05 DIAGNOSIS — I10 Essential (primary) hypertension: Secondary | ICD-10-CM | POA: Diagnosis not present

## 2022-12-05 DIAGNOSIS — R4182 Altered mental status, unspecified: Secondary | ICD-10-CM | POA: Diagnosis not present

## 2022-12-05 DIAGNOSIS — T402X1A Poisoning by other opioids, accidental (unintentional), initial encounter: Secondary | ICD-10-CM

## 2022-12-05 LAB — CBC
HCT: 44.5 % (ref 39.0–52.0)
Hemoglobin: 16 g/dL (ref 13.0–17.0)
MCH: 36.7 pg — ABNORMAL HIGH (ref 26.0–34.0)
MCHC: 36 g/dL (ref 30.0–36.0)
MCV: 102.1 fL — ABNORMAL HIGH (ref 80.0–100.0)
Platelets: 143 10*3/uL — ABNORMAL LOW (ref 150–400)
RBC: 4.36 MIL/uL (ref 4.22–5.81)
RDW: 11.2 % — ABNORMAL LOW (ref 11.5–15.5)
WBC: 5 10*3/uL (ref 4.0–10.5)
nRBC: 0 % (ref 0.0–0.2)

## 2022-12-05 LAB — BASIC METABOLIC PANEL
Anion gap: 11 (ref 5–15)
BUN: 11 mg/dL (ref 6–20)
CO2: 28 mmol/L (ref 22–32)
Calcium: 9 mg/dL (ref 8.9–10.3)
Chloride: 100 mmol/L (ref 98–111)
Creatinine, Ser: 0.84 mg/dL (ref 0.61–1.24)
GFR, Estimated: >60 mL/min (ref >60)
Glucose, Bld: 103 mg/dL — ABNORMAL HIGH (ref 70–99)
Potassium: 3.6 mmol/L (ref 3.5–5.1)
Sodium: 139 mmol/L (ref 135–145)

## 2022-12-05 NOTE — ED Provider Notes (Signed)
[facility] EMERGENCY DEPARTMENT Provider Note   CSN: [id number] Arrival date & time: 12/05/22  1547     History  Chief Complaint  Patient presents with   Drug Overdose    Gregory Murphy is a 36 y.o. male with history of brain injury, depression, insomnia, substance use disorder who presents the emergency department after drug overdose.  Patient was initially unresponsive on scene, they suspected heroin overdose.  Patient reports a history of heroin use, but states that he has been clean for "a while now".  He does report taking half of a Percocet 10 mg from a friend because he was in pain.  States that he is in pain all the time.  He believes the medication was likely laced with fentanyl.  EMS reports that he had pinpoint unreactive pupils, and had an oxygen saturation of 85% on room air.  He was placed on 4 L nasal cannula and improved to 100%.  They also gave 1 mg of Narcan via IV, patient became responsive, and alert and oriented x 4.  Patient states that he took the medication for pain, was not intending to harm himself.  He does not want to stay for further evaluation and has no complaints at this time. Is afraid he won't make it to work if he stays any longer.    Drug Overdose       Home Medications Prior to Admission medications   Medication Sig Start Date End Date Taking? Authorizing Provider  oxyCODONE-acetaminophen (PERCOCET) 5-325 MG tablet Take 1 tablet by mouth every 4 (four) hours as needed for severe pain. 03/21/22 03/21/23  Dairl Ponder, MD      Allergies    Patient has no known allergies.    Review of Systems   Review of Systems  All other systems reviewed and are negative.   Physical Exam Updated Vital Signs BP (!) 146/107   Pulse 86   Temp 98.4 F (36.9 C) (Oral)   Resp 10   SpO2 96%  Physical Exam Vitals and nursing note reviewed.  Constitutional:      Appearance: Normal appearance.  HENT:     Head: Normocephalic and atraumatic.  Eyes:      Conjunctiva/sclera: Conjunctivae normal.  Pulmonary:     Effort: Pulmonary effort is normal. No respiratory distress.  Skin:    General: Skin is warm and dry.  Neurological:     Mental Status: He is alert.  Psychiatric:        Mood and Affect: Mood normal.        Behavior: Behavior normal.     ED Results / Procedures / Treatments   Labs (all labs ordered are listed, but only abnormal results are displayed) Labs Reviewed  CBC  BASIC METABOLIC PANEL    EKG None  Radiology No results found.  Procedures Procedures    Medications Ordered in ED Medications - No data to display  ED Course/ Medical Decision Making/ A&P                           Medical Decision Making Amount and/or Complexity of Data Reviewed Labs: ordered.  This patient is a 36 y.o. male who presents to the ED for concern of unintentional opioid overdose. Received narcan on scene.  Past Medical History / Social History / Additional history: Chart reviewed. Pertinent results include: brain injury, depression, insomnia, substance use disorder   Physical Exam: Physical exam performed. The pertinent findings  include: Mildly hypertensive, otherwise normal vital signs. In no acute distress.   Medications / Treatment: Tried to obtain laboratory evaluation, but patient refused   Disposition: Discussed with patient thoroughly that I wanted him to stay for evaluation and observation after his overdose. Patient elected to leave AGAINST MEDICAL ADVICE. We discussed the nature and purpose, risks and benefits, as well as, the alternatives of treatment. Time was given to allow the opportunity to ask questions and consider their options, and after the discussion, the patient decided to refuse the offerred treatment. The patient was informed that refusal could lead to, but was not limited to, death, permanent disability, or severe pain. Prior to refusing, I determined that the patient had the capacity to make their  decision and understood the consequences of that decision. After refusal, I made every reasonable opportunity to treat them to the best of my ability.  The patient was notified that they may return to the emergency department at any time for further treatment.    Final Clinical Impression(s) / ED Diagnoses Final diagnoses:  Opioid overdose, accidental or unintentional, initial encounter [facility])    Rx / DC Orders ED Discharge Orders     None      Portions of this report may have been transcribed using voice recognition software. Every effort was made to ensure accuracy; however, inadvertent computerized transcription errors may be present.    Jeanella Flattery 12/05/22 1716    Bethann Berkshire, MD 12/13/22 1549

## 2022-12-05 NOTE — ED Triage Notes (Signed)
Pt arrived via [facility] c/o initially unresponsive due to possible heroin overdose. Hx of drug abuse, but states he has been clean for a while now, but took a pain pill from someone bc he was in pain and did not know anything was in it. Per EMS, pinpoint pupils that was unreactive and 85% on RA and placed on 4 L [place] and went back up to 100% Per EMS, 1 mg of narcan was given via IV and pt become responsive and alert x 4.

## 2022-12-05 NOTE — Discharge Instructions (Signed)
You were seen in the emergency department after an accidental overdose.  As we discussed, it is my clinical opinion that you stay for further laboratory evaluation and observation, but you have declined this and decided to leave AGAINST MEDICAL ADVICE.  Please return to the ER for any other symptoms.

## 2023-09-07 ENCOUNTER — Other Ambulatory Visit: Payer: Self-pay

## 2023-09-07 ENCOUNTER — Emergency Department (HOSPITAL_COMMUNITY): Payer: Managed Care, Other (non HMO)

## 2023-09-07 ENCOUNTER — Emergency Department (HOSPITAL_COMMUNITY): Payer: No Typology Code available for payment source

## 2023-09-07 ENCOUNTER — Inpatient Hospital Stay (HOSPITAL_COMMUNITY)
Admission: EM | Admit: 2023-09-07 | Discharge: 2023-09-11 | DRG: 814 | Disposition: A | Discharge status: Home / Self Care | Payer: Managed Care, Other (non HMO) | Attending: Surgery | Admitting: Surgery

## 2023-09-07 ENCOUNTER — Encounter (HOSPITAL_COMMUNITY): Payer: Self-pay

## 2023-09-07 DIAGNOSIS — S3609XA Other injury of spleen, initial encounter: Secondary | ICD-10-CM

## 2023-09-07 DIAGNOSIS — K920 Hematemesis: Secondary | ICD-10-CM | POA: Diagnosis present

## 2023-09-07 DIAGNOSIS — K760 Fatty (change of) liver, not elsewhere classified: Secondary | ICD-10-CM | POA: Diagnosis present

## 2023-09-07 DIAGNOSIS — S36031A Moderate laceration of spleen, initial encounter: Principal | ICD-10-CM | POA: Diagnosis present

## 2023-09-07 DIAGNOSIS — R509 Fever, unspecified: Secondary | ICD-10-CM | POA: Diagnosis not present

## 2023-09-07 DIAGNOSIS — F17210 Nicotine dependence, cigarettes, uncomplicated: Secondary | ICD-10-CM | POA: Diagnosis present

## 2023-09-07 DIAGNOSIS — D696 Thrombocytopenia, unspecified: Secondary | ICD-10-CM | POA: Diagnosis present

## 2023-09-07 DIAGNOSIS — Y902 Blood alcohol level of 40-59 mg/100 ml: Secondary | ICD-10-CM | POA: Diagnosis present

## 2023-09-07 DIAGNOSIS — F1010 Alcohol abuse, uncomplicated: Secondary | ICD-10-CM | POA: Diagnosis present

## 2023-09-07 DIAGNOSIS — K8020 Calculus of gallbladder without cholecystitis without obstruction: Secondary | ICD-10-CM | POA: Diagnosis present

## 2023-09-07 DIAGNOSIS — K7460 Unspecified cirrhosis of liver: Secondary | ICD-10-CM | POA: Diagnosis present

## 2023-09-07 DIAGNOSIS — I8510 Secondary esophageal varices without bleeding: Secondary | ICD-10-CM | POA: Diagnosis present

## 2023-09-07 DIAGNOSIS — R000 Tachycardia, unspecified: Secondary | ICD-10-CM | POA: Diagnosis present

## 2023-09-07 DIAGNOSIS — Y92410 Unspecified street and highway as the place of occurrence of the external cause: Secondary | ICD-10-CM

## 2023-09-07 DIAGNOSIS — I2510 Atherosclerotic heart disease of native coronary artery without angina pectoris: Secondary | ICD-10-CM | POA: Diagnosis present

## 2023-09-07 DIAGNOSIS — K661 Hemoperitoneum: Secondary | ICD-10-CM | POA: Diagnosis present

## 2023-09-07 DIAGNOSIS — D62 Acute posthemorrhagic anemia: Secondary | ICD-10-CM | POA: Diagnosis present

## 2023-09-07 HISTORY — DX: Other injury of spleen, initial encounter: S36.09XA

## 2023-09-07 LAB — CBC WITH DIFFERENTIAL/PLATELET
Abs Immature Granulocytes: 0.08 10*3/uL — ABNORMAL HIGH (ref 0.00–0.07)
Basophils Absolute: 0 10*3/uL (ref 0.0–0.1)
Basophils Relative: 0 %
Eosinophils Absolute: 0 10*3/uL (ref 0.0–0.5)
Eosinophils Relative: 0 %
HCT: 23.5 % — ABNORMAL LOW (ref 39.0–52.0)
Hemoglobin: 7.9 g/dL — ABNORMAL LOW (ref 13.0–17.0)
Immature Granulocytes: 1 %
Lymphocytes Relative: 11 %
Lymphs Abs: 1.4 10*3/uL (ref 0.7–4.0)
MCH: 36.2 pg — ABNORMAL HIGH (ref 26.0–34.0)
MCHC: 33.6 g/dL (ref 30.0–36.0)
MCV: 107.8 fL — ABNORMAL HIGH (ref 80.0–100.0)
Monocytes Absolute: 1.2 10*3/uL — ABNORMAL HIGH (ref 0.1–1.0)
Monocytes Relative: 10 %
Neutro Abs: 9.9 10*3/uL — ABNORMAL HIGH (ref 1.7–7.7)
Neutrophils Relative %: 78 %
Platelets: 75 10*3/uL — ABNORMAL LOW (ref 150–400)
RBC: 2.18 MIL/uL — ABNORMAL LOW (ref 4.22–5.81)
RDW: 13 % (ref 11.5–15.5)
WBC: 12.6 10*3/uL — ABNORMAL HIGH (ref 4.0–10.5)
nRBC: 0 % (ref 0.0–0.2)

## 2023-09-07 LAB — BASIC METABOLIC PANEL
Anion gap: 14 (ref 5–15)
BUN: 8 mg/dL (ref 6–20)
CO2: 20 mmol/L — ABNORMAL LOW (ref 22–32)
Calcium: 7.2 mg/dL — ABNORMAL LOW (ref 8.9–10.3)
Chloride: 105 mmol/L (ref 98–111)
Creatinine, Ser: 2.01 mg/dL — ABNORMAL HIGH (ref 0.61–1.24)
GFR, Estimated: 43 mL/min — ABNORMAL LOW (ref >60)
Glucose, Bld: 97 mg/dL (ref 70–99)
Potassium: 4.7 mmol/L (ref 3.5–5.1)
Sodium: 139 mmol/L (ref 135–145)

## 2023-09-07 LAB — CBC
HCT: 25.1 % — ABNORMAL LOW (ref 39.0–52.0)
Hemoglobin: 8.5 g/dL — ABNORMAL LOW (ref 13.0–17.0)
MCH: 34.3 pg — ABNORMAL HIGH (ref 26.0–34.0)
MCHC: 33.9 g/dL (ref 30.0–36.0)
MCV: 101.2 fL — ABNORMAL HIGH (ref 80.0–100.0)
Platelets: 41 10*3/uL — ABNORMAL LOW (ref 150–400)
RBC: 2.48 MIL/uL — ABNORMAL LOW (ref 4.22–5.81)
RDW: 15.1 % (ref 11.5–15.5)
WBC: 9.1 10*3/uL (ref 4.0–10.5)
nRBC: 0 % (ref 0.0–0.2)

## 2023-09-07 LAB — COMPREHENSIVE METABOLIC PANEL
ALT: 53 U/L — ABNORMAL HIGH (ref 0–44)
AST: 148 U/L — ABNORMAL HIGH (ref 15–41)
Albumin: 3.4 g/dL — ABNORMAL LOW (ref 3.5–5.0)
Alkaline Phosphatase: 85 U/L (ref 38–126)
Anion gap: 20 — ABNORMAL HIGH (ref 5–15)
BUN: 9 mg/dL (ref 6–20)
CO2: 20 mmol/L — ABNORMAL LOW (ref 22–32)
Calcium: 8.4 mg/dL — ABNORMAL LOW (ref 8.9–10.3)
Chloride: 96 mmol/L — ABNORMAL LOW (ref 98–111)
Creatinine, Ser: 2.2 mg/dL — ABNORMAL HIGH (ref 0.61–1.24)
GFR, Estimated: 39 mL/min — ABNORMAL LOW (ref >60)
Glucose, Bld: 102 mg/dL — ABNORMAL HIGH (ref 70–99)
Potassium: 4.9 mmol/L (ref 3.5–5.1)
Sodium: 136 mmol/L (ref 135–145)
Total Bilirubin: 2.2 mg/dL — ABNORMAL HIGH (ref 0.3–1.2)
Total Protein: 6.2 g/dL — ABNORMAL LOW (ref 6.5–8.1)

## 2023-09-07 LAB — TROPONIN I (HIGH SENSITIVITY)
Troponin I (High Sensitivity): 41 ng/L — ABNORMAL HIGH (ref <18)
Troponin I (High Sensitivity): 54 ng/L — ABNORMAL HIGH (ref <18)

## 2023-09-07 LAB — PROTIME-INR
INR: 1.5 — ABNORMAL HIGH (ref 0.8–1.2)
Prothrombin Time: 18.7 seconds — ABNORMAL HIGH (ref 11.4–15.2)

## 2023-09-07 LAB — LACTIC ACID, PLASMA
Lactic Acid, Venous: >9 mmol/L (ref 0.5–1.9)
Lactic Acid, Venous: >9 mmol/L (ref 0.5–1.9)
Lactic Acid, Venous: 5.9 mmol/L (ref 0.5–1.9)

## 2023-09-07 LAB — PREPARE RBC (CROSSMATCH)

## 2023-09-07 LAB — ETHANOL: Alcohol, Ethyl (B): 47 mg/dL — ABNORMAL HIGH (ref <10)

## 2023-09-07 MED ORDER — THIAMINE HCL 100 MG/ML IJ SOLN: 100.0000 mg | Freq: Every day | INTRAMUSCULAR | Status: DC

## 2023-09-07 MED ORDER — THIAMINE MONONITRATE 100 MG PO TABS: 100.0000 mg | ORAL_TABLET | Freq: Every day | ORAL | Status: DC

## 2023-09-07 MED ORDER — ACETAMINOPHEN 325 MG PO TABS
650.0000 mg | ORAL_TABLET | Freq: Four times a day (QID) | ORAL | Status: DC
Start: 2023-09-07 — End: 2023-09-07

## 2023-09-07 MED ORDER — FOLIC ACID 1 MG PO TABS: 1.0000 mg | ORAL_TABLET | Freq: Every day | ORAL | Status: DC

## 2023-09-07 MED ORDER — LORAZEPAM 1 MG PO TABS: 0.0000 mg | ORAL_TABLET | ORAL | Status: DC

## 2023-09-07 MED ORDER — LORAZEPAM 2 MG/ML IJ SOLN: 1.0000 mg | INTRAMUSCULAR | Status: AC | PRN

## 2023-09-07 MED ORDER — ONDANSETRON HCL 4 MG/2ML IJ SOLN: 4.0000 mg | Freq: Four times a day (QID) | INTRAMUSCULAR | Status: DC | PRN

## 2023-09-07 MED ORDER — SODIUM CHLORIDE 0.9% IV SOLUTION: Freq: Once | INTRAVENOUS | Status: DC

## 2023-09-07 MED ORDER — CHLORHEXIDINE GLUCONATE CLOTH 2 % EX PADS
6.0000 | MEDICATED_PAD | Freq: Every day | CUTANEOUS | Status: DC
  Administered 2023-09-08 – 2023-09-11 (×4): 6 via TOPICAL

## 2023-09-07 MED ORDER — IOHEXOL 300 MG/ML  SOLN
100.0000 mL | Freq: Once | INTRAMUSCULAR | Status: AC | PRN
  Administered 2023-09-07: 80 mL via INTRAVENOUS

## 2023-09-07 MED ORDER — LORAZEPAM 1 MG PO TABS: 1.0000 mg | ORAL_TABLET | ORAL | Status: DC | PRN

## 2023-09-07 MED ORDER — ONDANSETRON HCL 4 MG/2ML IJ SOLN
4.0000 mg | Freq: Once | INTRAMUSCULAR | Status: AC
  Administered 2023-09-07: 4 mg via INTRAVENOUS
  Filled 2023-09-07: qty 2

## 2023-09-07 MED ORDER — PROCHLORPERAZINE EDISYLATE 10 MG/2ML IJ SOLN: 10.0000 mg | INTRAMUSCULAR | Status: DC | PRN

## 2023-09-07 MED ORDER — THIAMINE MONONITRATE 100 MG PO TABS
100.0000 mg | ORAL_TABLET | Freq: Every day | ORAL | Status: DC
  Administered 2023-09-08 – 2023-09-11 (×4): 100 mg via ORAL
  Filled 2023-09-07 (×4): qty 1

## 2023-09-07 MED ORDER — ACETAMINOPHEN 500 MG PO TABS
1000.0000 mg | ORAL_TABLET | Freq: Four times a day (QID) | ORAL | Status: DC
  Administered 2023-09-07 – 2023-09-08 (×2): 1000 mg via ORAL
  Filled 2023-09-07 (×2): qty 2

## 2023-09-07 MED ORDER — MORPHINE SULFATE (PF) 4 MG/ML IV SOLN
4.0000 mg | Freq: Once | INTRAVENOUS | Status: AC
  Administered 2023-09-07: 4 mg via INTRAVENOUS
  Filled 2023-09-07: qty 1

## 2023-09-07 MED ORDER — ONDANSETRON 4 MG PO TBDP: 4.0000 mg | ORAL_TABLET | Freq: Four times a day (QID) | ORAL | Status: DC | PRN

## 2023-09-07 MED ORDER — GABAPENTIN 300 MG PO CAPS
300.0000 mg | ORAL_CAPSULE | Freq: Three times a day (TID) | ORAL | Status: DC
  Administered 2023-09-07 – 2023-09-11 (×11): 300 mg via ORAL
  Filled 2023-09-07 (×11): qty 1

## 2023-09-07 MED ORDER — SODIUM CHLORIDE 0.9 % IV BOLUS
1000.0000 mL | Freq: Once | INTRAVENOUS | Status: AC
  Administered 2023-09-07: 1000 mL via INTRAVENOUS

## 2023-09-07 MED ORDER — THIAMINE HCL 100 MG/ML IJ SOLN
100.0000 mg | Freq: Every day | INTRAMUSCULAR | Status: DC
  Administered 2023-09-07: 100 mg via INTRAVENOUS
  Filled 2023-09-07 (×2): qty 2

## 2023-09-07 MED ORDER — METHOCARBAMOL 1000 MG/10ML IJ SOLN: 500.0000 mg | Freq: Four times a day (QID) | INTRAVENOUS | Status: DC | PRN

## 2023-09-07 MED ORDER — ADULT MULTIVITAMIN W/MINERALS CH
1.0000 | ORAL_TABLET | Freq: Every day | ORAL | Status: DC
  Administered 2023-09-08 – 2023-09-11 (×4): 1 via ORAL
  Filled 2023-09-07 (×4): qty 1

## 2023-09-07 MED ORDER — PANTOPRAZOLE SODIUM 40 MG IV SOLR
40.0000 mg | Freq: Once | INTRAVENOUS | Status: AC
  Administered 2023-09-07: 40 mg via INTRAVENOUS
  Filled 2023-09-07: qty 10

## 2023-09-07 MED ORDER — LORAZEPAM 1 MG PO TABS: 0.0000 mg | ORAL_TABLET | Freq: Three times a day (TID) | ORAL | Status: DC

## 2023-09-07 MED ORDER — ADULT MULTIVITAMIN W/MINERALS CH: 1.0000 | ORAL_TABLET | Freq: Every day | ORAL | Status: DC

## 2023-09-07 MED ORDER — SIMETHICONE 80 MG PO CHEW: 80.0000 mg | CHEWABLE_TABLET | Freq: Four times a day (QID) | ORAL | Status: DC | PRN

## 2023-09-07 MED ORDER — LORAZEPAM 1 MG PO TABS: 1.0000 mg | ORAL_TABLET | ORAL | Status: AC | PRN

## 2023-09-07 MED ORDER — OXYCODONE HCL 5 MG PO TABS: 10.0000 mg | ORAL_TABLET | ORAL | Status: DC | PRN

## 2023-09-07 MED ORDER — DOCUSATE SODIUM 100 MG PO CAPS
100.0000 mg | ORAL_CAPSULE | Freq: Two times a day (BID) | ORAL | Status: DC
Start: 2023-09-07 — End: 2023-09-07

## 2023-09-07 MED ORDER — SODIUM CHLORIDE 0.9 % IV BOLUS: 1000.0000 mL | Freq: Once | INTRAVENOUS | Status: DC

## 2023-09-07 MED ORDER — HYDRALAZINE HCL 20 MG/ML IJ SOLN: 10.0000 mg | INTRAMUSCULAR | Status: DC | PRN

## 2023-09-07 MED ORDER — FENTANYL CITRATE PF 50 MCG/ML IJ SOSY
50.0000 ug | PREFILLED_SYRINGE | Freq: Once | INTRAMUSCULAR | Status: AC
  Administered 2023-09-07: 50 ug via INTRAVENOUS
  Filled 2023-09-07: qty 1

## 2023-09-07 MED ORDER — POLYETHYLENE GLYCOL 3350 17 G PO PACK: 17.0000 g | PACK | Freq: Every day | ORAL | Status: DC | PRN

## 2023-09-07 MED ORDER — METHOCARBAMOL 1000 MG/10ML IJ SOLN
500.0000 mg | Freq: Three times a day (TID) | INTRAVENOUS | Status: DC
  Filled 2023-09-07: qty 5

## 2023-09-07 MED ORDER — HYDROMORPHONE HCL 1 MG/ML IJ SOLN: 1.0000 mg | INTRAMUSCULAR | Status: DC | PRN

## 2023-09-07 MED ORDER — FOLIC ACID 1 MG PO TABS
1.0000 mg | ORAL_TABLET | Freq: Every day | ORAL | Status: DC
  Administered 2023-09-08 – 2023-09-11 (×4): 1 mg via ORAL
  Filled 2023-09-07 (×4): qty 1

## 2023-09-07 MED ORDER — LORAZEPAM 2 MG/ML IJ SOLN
1.0000 mg | Freq: Once | INTRAMUSCULAR | Status: AC
  Administered 2023-09-07: 1 mg via INTRAVENOUS
  Filled 2023-09-07: qty 1

## 2023-09-07 MED ORDER — METOPROLOL TARTRATE 5 MG/5ML IV SOLN: 5.0000 mg | Freq: Four times a day (QID) | INTRAVENOUS | Status: DC | PRN

## 2023-09-07 MED ORDER — LACTATED RINGERS IV SOLN: INTRAVENOUS | Status: DC

## 2023-09-07 MED ORDER — METHOCARBAMOL 500 MG PO TABS
500.0000 mg | ORAL_TABLET | Freq: Three times a day (TID) | ORAL | Status: AC
  Administered 2023-09-07 – 2023-09-10 (×9): 500 mg via ORAL
  Filled 2023-09-07 (×9): qty 1

## 2023-09-07 MED ORDER — LORAZEPAM 2 MG/ML IJ SOLN: 1.0000 mg | INTRAMUSCULAR | Status: DC | PRN

## 2023-09-07 MED ORDER — DOCUSATE SODIUM 100 MG PO CAPS
100.0000 mg | ORAL_CAPSULE | Freq: Two times a day (BID) | ORAL | Status: DC
  Administered 2023-09-07 – 2023-09-11 (×7): 100 mg via ORAL
  Filled 2023-09-07 (×7): qty 1

## 2023-09-07 MED ORDER — OXYCODONE HCL 5 MG PO TABS
5.0000 mg | ORAL_TABLET | ORAL | Status: DC | PRN
  Administered 2023-09-08 (×2): 5 mg via ORAL
  Filled 2023-09-07 (×2): qty 1

## 2023-09-07 MED ORDER — ORAL CARE MOUTH RINSE: 15.0000 mL | OROMUCOSAL | Status: DC | PRN
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
## 2023-09-07 NOTE — ED Notes (Signed)
Patient arrived from [facility] .   Transport reports 131/87, P 120, 96% RA  A&O x 4

## 2023-09-07 NOTE — ED Notes (Signed)
Ace bandage applied to upper torso for comfort
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
## 2023-09-07 NOTE — ED Notes (Signed)
Report given to [facility], ETA 20 minutes
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
## 2023-09-07 NOTE — ED Notes (Signed)
[facility] arrived for transport to [facility]

## 2023-09-07 NOTE — ED Notes (Signed)
Pt is in CT

## 2023-09-08 ENCOUNTER — Encounter (HOSPITAL_COMMUNITY): Payer: Self-pay

## 2023-09-08 LAB — COMPREHENSIVE METABOLIC PANEL
ALT: 35 U/L (ref 0–44)
AST: 102 U/L — ABNORMAL HIGH (ref 15–41)
Albumin: 3 g/dL — ABNORMAL LOW (ref 3.5–5.0)
Alkaline Phosphatase: 76 U/L (ref 38–126)
Anion gap: 10 (ref 5–15)
BUN: 12 mg/dL (ref 6–20)
CO2: 25 mmol/L (ref 22–32)
Calcium: 7.4 mg/dL — ABNORMAL LOW (ref 8.9–10.3)
Chloride: 102 mmol/L (ref 98–111)
Creatinine, Ser: 1.92 mg/dL — ABNORMAL HIGH (ref 0.61–1.24)
GFR, Estimated: 45 mL/min — ABNORMAL LOW (ref >60)
Glucose, Bld: 82 mg/dL (ref 70–99)
Potassium: 3.9 mmol/L (ref 3.5–5.1)
Sodium: 137 mmol/L (ref 135–145)
Total Bilirubin: 2.2 mg/dL — ABNORMAL HIGH (ref 0.3–1.2)
Total Protein: 5.8 g/dL — ABNORMAL LOW (ref 6.5–8.1)

## 2023-09-08 LAB — PREPARE FRESH FROZEN PLASMA: Unit division: 0

## 2023-09-08 LAB — BPAM FFP
Blood Product Expiration Date: 202409262359
Blood Product Expiration Date: 202409262359
ISSUE DATE / TIME: 202409212129
ISSUE DATE / TIME: 202409212129
Unit Type and Rh: 6200
Unit Type and Rh: 6200

## 2023-09-08 LAB — TYPE AND SCREEN
ABO/RH(D): A POS
Antibody Screen: NEGATIVE
Unit division: 0

## 2023-09-08 LAB — BPAM PLATELET PHERESIS
Blood Product Expiration Date: 202409242359
Blood Product Expiration Date: 202409252359
ISSUE DATE / TIME: 202409212129
ISSUE DATE / TIME: 202409212129
Unit Type and Rh: 5100
Unit Type and Rh: 7300

## 2023-09-08 LAB — PROTIME-INR
INR: 1.4 — ABNORMAL HIGH (ref 0.8–1.2)
Prothrombin Time: 16.9 seconds — ABNORMAL HIGH (ref 11.4–15.2)

## 2023-09-08 LAB — BPAM RBC
Blood Product Expiration Date: 202410212359
ISSUE DATE / TIME: 202409211812
Unit Type and Rh: 6200

## 2023-09-08 LAB — CBC
HCT: 19.6 % — ABNORMAL LOW (ref 39.0–52.0)
Hemoglobin: 6.7 g/dL — CL (ref 13.0–17.0)
MCH: 34.5 pg — ABNORMAL HIGH (ref 26.0–34.0)
MCHC: 34.2 g/dL (ref 30.0–36.0)
MCV: 101 fL — ABNORMAL HIGH (ref 80.0–100.0)
Platelets: 67 10*3/uL — ABNORMAL LOW (ref 150–400)
RBC: 1.94 MIL/uL — ABNORMAL LOW (ref 4.22–5.81)
RDW: 15.6 % — ABNORMAL HIGH (ref 11.5–15.5)
WBC: 5.6 10*3/uL (ref 4.0–10.5)
nRBC: 0 % (ref 0.0–0.2)

## 2023-09-08 LAB — PREPARE PLATELET PHERESIS
Unit division: 0
Unit division: 0

## 2023-09-08 LAB — HEMOGLOBIN AND HEMATOCRIT, BLOOD
HCT: 28.1 % — ABNORMAL LOW (ref 39.0–52.0)
Hemoglobin: 10.1 g/dL — ABNORMAL LOW (ref 13.0–17.0)

## 2023-09-08 LAB — HIV ANTIBODY (ROUTINE TESTING W REFLEX): HIV Screen 4th Generation wRfx: NONREACTIVE

## 2023-09-08 LAB — PREPARE RBC (CROSSMATCH)

## 2023-09-08 LAB — SURGICAL PCR SCREEN
MRSA, PCR: NEGATIVE
Staphylococcus aureus: NEGATIVE

## 2023-09-08 MED ORDER — CALCIUM GLUCONATE-NACL 2-0.675 GM/100ML-% IV SOLN
2.0000 g | Freq: Once | INTRAVENOUS | Status: AC
  Administered 2023-09-08: 2000 mg via INTRAVENOUS
  Filled 2023-09-08: qty 100

## 2023-09-08 MED ORDER — SODIUM CHLORIDE 0.9% IV SOLUTION: Freq: Once | INTRAVENOUS | Status: AC

## 2023-09-08 NOTE — Consult Note (Signed)
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 conditions.   IF your procedure requires a prolonged use of radiation, you will be notified and given written instructions for further action.  It is your responsibility to monitor the irradiated area for the 2 weeks following the procedure and to notify your physician if you are concerned that you have suffered a radiation induced injury.    All of the patient's questions were answered, patient is agreeable to proceed.  Consent signed and in chart.  IR is on stand by. Please page IR radiologist on call if patient's condition worsens.    Thank you for this interesting consult.  I greatly enjoyed meeting Gregory Murphy and look forward to participating in their care.  A copy of this report was sent to the requesting provider on this date.  Electronically Signed: Willette Brace, PA-C 09/08/2023, 1:40 PM   I spent a total of 40 Minutes    in face to face in clinical consultation, greater than 50% of which was counseling/coordinating care for splenic artery embolization.   This chart was dictated using voice recognition software.  Despite best efforts to  proofread,  errors can occur which can change the documentation meaning.
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
## 2023-09-08 NOTE — Progress Notes (Signed)
Critical Lab value received: Hgb 6.7.  Notified Trauma MD, Dr. Dossie Der at [phone].  VO for 2 units of PRBC.  Order placed and relayed to day shift RN.

## 2023-09-08 NOTE — Progress Notes (Signed)
At 0125 pt's O2 dropped into the low 80's while his HR increased to the 130's.  Upon assessment, pt stated he was having a little trouble breathing.  [place] applied at 4 L/min.  Pt noted to have expiratory wheezing.  Pt encouraged to suction phlegm out of mouth once coughed up.  Dr. Dossie Der paged.  No new orders at this time.  After coughing phlegm up pt stated his breathing has improved.
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
## 2023-09-09 ENCOUNTER — Inpatient Hospital Stay (HOSPITAL_COMMUNITY): Payer: Managed Care, Other (non HMO)

## 2023-09-09 LAB — TYPE AND SCREEN
ABO/RH(D): A POS
Antibody Screen: NEGATIVE
Unit division: 0
Unit division: 0

## 2023-09-09 LAB — BPAM RBC
Blood Product Expiration Date: 202410162359
Blood Product Expiration Date: 202410172359
ISSUE DATE / TIME: 202409220757
ISSUE DATE / TIME: 202409221012
Unit Type and Rh: 6200
Unit Type and Rh: 6200

## 2023-09-09 LAB — CBC
HCT: 31.9 % — ABNORMAL LOW (ref 39.0–52.0)
Hemoglobin: 10.8 g/dL — ABNORMAL LOW (ref 13.0–17.0)
MCH: 33.4 pg (ref 26.0–34.0)
MCHC: 33.9 g/dL (ref 30.0–36.0)
MCV: 98.8 fL (ref 80.0–100.0)
Platelets: 60 10*3/uL — ABNORMAL LOW (ref 150–400)
RBC: 3.23 MIL/uL — ABNORMAL LOW (ref 4.22–5.81)
RDW: 15.4 % (ref 11.5–15.5)
WBC: 5.9 10*3/uL (ref 4.0–10.5)
nRBC: 0 % (ref 0.0–0.2)

## 2023-09-09 LAB — BASIC METABOLIC PANEL
Anion gap: 8 (ref 5–15)
BUN: 14 mg/dL (ref 6–20)
CO2: 24 mmol/L (ref 22–32)
Calcium: 8 mg/dL — ABNORMAL LOW (ref 8.9–10.3)
Chloride: 101 mmol/L (ref 98–111)
Creatinine, Ser: 1.71 mg/dL — ABNORMAL HIGH (ref 0.61–1.24)
GFR, Estimated: 52 mL/min — ABNORMAL LOW (ref >60)
Glucose, Bld: 94 mg/dL (ref 70–99)
Potassium: 3.7 mmol/L (ref 3.5–5.1)
Sodium: 133 mmol/L — ABNORMAL LOW (ref 135–145)

## 2023-09-09 LAB — PHOSPHORUS
Phosphorus: 2.4 mg/dL — ABNORMAL LOW (ref 2.5–4.6)
Phosphorus: 4.4 mg/dL (ref 2.5–4.6)

## 2023-09-09 LAB — MAGNESIUM
Magnesium: 1.2 mg/dL — ABNORMAL LOW (ref 1.7–2.4)
Magnesium: 3.2 mg/dL — ABNORMAL HIGH (ref 1.7–2.4)

## 2023-09-09 MED ORDER — SPIRITUS FRUMENTI
1.0000 | Freq: Three times a day (TID) | ORAL | Status: DC
  Administered 2023-09-09 – 2023-09-11 (×7): 1 via ORAL
  Filled 2023-09-09 (×9): qty 1

## 2023-09-09 MED ORDER — OXYCODONE HCL 5 MG PO TABS: 5.0000 mg | ORAL_TABLET | ORAL | Status: DC | PRN

## 2023-09-09 MED ORDER — MAGNESIUM SULFATE 50 % IJ SOLN
6.0000 g | Freq: Once | INTRAVENOUS | Status: AC
  Administered 2023-09-09: 6 g via INTRAVENOUS
  Filled 2023-09-09: qty 12

## 2023-09-09 MED ORDER — POTASSIUM PHOSPHATES 15 MMOLE/5ML IV SOLN
30.0000 mmol | Freq: Once | INTRAVENOUS | Status: AC
  Administered 2023-09-09: 30 mmol via INTRAVENOUS
  Filled 2023-09-09: qty 10

## 2023-09-09 MED ORDER — ALBUTEROL SULFATE (2.5 MG/3ML) 0.083% IN NEBU
2.5000 mg | INHALATION_SOLUTION | RESPIRATORY_TRACT | Status: DC | PRN
  Administered 2023-09-09: 2.5 mg via RESPIRATORY_TRACT
  Filled 2023-09-09: qty 3

## 2023-09-09 MED ORDER — FUROSEMIDE 10 MG/ML IJ SOLN
80.0000 mg | Freq: Once | INTRAMUSCULAR | Status: AC
  Administered 2023-09-09: 80 mg via INTRAVENOUS
  Filled 2023-09-09: qty 8

## 2023-09-09 MED ORDER — ACETAMINOPHEN 325 MG PO TABS
650.0000 mg | ORAL_TABLET | Freq: Four times a day (QID) | ORAL | Status: DC | PRN
  Administered 2023-09-10: 650 mg via ORAL
  Filled 2023-09-09: qty 2

## 2023-09-09 MED ORDER — SODIUM CHLORIDE 0.9 % IV SOLN: INTRAVENOUS | Status: DC | PRN

## 2023-09-09 NOTE — Progress Notes (Signed)
Interventional Radiology Brief Note:  IR following for possible embolization of splenic laceration.  No active extravasation on CT imaging yesterday therefore patient resuscitation with fluid and blood products.  Hgb improved to 10.1 after transfusion.  Holding at 10.8 this AM. Discussed with Dr. Milford Cage, IR to remain available, however no indication to proceed with splenic artery embolization at this time.  Will cancel order.  Please re-consult IR if needed.   Loyce Dys, MS RD PA-C 1:33 PM

## 2023-09-09 NOTE — Progress Notes (Signed)
Trauma Event Note    Notified by primary RN that patient is requiring more and more oxygen as the shift progresses. Earlier in the day, he was on 4L. 88% on 6L [place] with salter at this time. Expiratory wheeze on auscultation. MD stechschulte notified. Order for XRAY and neb treatments.   Last imported Vital Signs BP 118/85   Pulse 84   Temp 100 F (37.8 C) (Oral)   Resp 16   Ht 5\' 1"  (1.549 m)   Wt 112 lb 10.5 oz (51.1 kg)   SpO2 93%   BMI 21.29 kg/m   Trending CBC Recent Labs    09/07/23 1721 09/07/23 2101 09/08/23 0617 09/08/23 1425  WBC 12.6* 9.1 5.6  --   HGB 7.9* 8.5* 6.7* 10.1*  HCT 23.5* 25.1* 19.6* 28.1*  PLT 75* 41* 67*  --     Trending Coag's Recent Labs    09/07/23 1753 09/08/23 0617  INR 1.5* 1.4*    Trending BMET Recent Labs    09/07/23 1721 09/07/23 2101 09/08/23 0617  NA 136 139 137  K 4.9 4.7 3.9  CL 96* 105 102  CO2 20* 20* 25  BUN 9 8 12   CREATININE 2.20* 2.01* 1.92*  GLUCOSE 102* 97 82      Tannis Burstein O Kristofer Schaffert  Trauma Response RN  Please call TRN at [phone] for further assistance.

## 2023-09-09 NOTE — Progress Notes (Signed)
O2 sats and oxygen demand continuing to worsen throughout shift. On 15L [place] nasal cannula with sats at 91%. Expiratory wheeze slightly improved following neb. Chest X-ray recommending CT. Notified MD of X-ray and of worsening oxygenation, came to bedside and performed bedside ultrasound. CT chest ordered following ultrasound. Escorted patient immediately down to CT on nonrebreather, continuing to sat 88% with brief self-resolved episode of desatting to 80%. Scan with pulmonary contusion vs aspiration. Plan to obs, continue supportive care, may need to escalate to CPAP/BiPAP. Pt updated.
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
## 2023-09-09 NOTE — Progress Notes (Signed)
Pt started off on 4L [place] at the beginning of this shift.  Around 0320 pt desated to 88%.  [place] increased to 6L with no increase in O2 sat.  Pt with expiratory wheezing.  Denies SOB.  [place] increased to 7 L.  TRN notified and came to bedside.  Trauma MD notified.  Order for Xray and albuterol nebulizer treatment placed.  Administered neb-- slight improvement in wheezing.  O2 sats remain low.  [place] increased to 10 L.  Pt now at 93%.

## 2023-09-09 NOTE — Progress Notes (Signed)
0500 pt desated to 90%.  HFNC increased to 12 L.  Pt O2 sat increased to 92 %.  Notified by TRN that Dr. Dossie Der is planning to come to bedside to assess pt.  At 0536 pt desated to 87%.  HFNC increased to 15 L.  Pt's O2 sats still between 88 and 92 % on 15L.  MD at bedside and aware of oxygen requirements.  MD performed ultrasound and ordered chest CT.  Pt taken to CT and back without complications.  MD reviewed CT.  No interventions warranted at this time per MD.

## 2023-09-09 NOTE — Plan of Care (Signed)
  Problem: Activity: Goal: Risk for activity intolerance will decrease Outcome: Progressing Patient up in chair for ~1 hour.    Problem: Nutrition: Goal: Adequate nutrition will be maintained Outcome: Progressing  Patient consumed most of lunch.
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
## 2023-09-10 ENCOUNTER — Inpatient Hospital Stay (HOSPITAL_COMMUNITY): Payer: Managed Care, Other (non HMO)

## 2023-09-10 LAB — CBC
HCT: 31.1 % — ABNORMAL LOW (ref 39.0–52.0)
Hemoglobin: 10.9 g/dL — ABNORMAL LOW (ref 13.0–17.0)
MCH: 34 pg (ref 26.0–34.0)
MCHC: 35 g/dL (ref 30.0–36.0)
MCV: 96.9 fL (ref 80.0–100.0)
Platelets: 62 10*3/uL — ABNORMAL LOW (ref 150–400)
RBC: 3.21 MIL/uL — ABNORMAL LOW (ref 4.22–5.81)
RDW: 14.2 % (ref 11.5–15.5)
WBC: 5.5 10*3/uL (ref 4.0–10.5)
nRBC: 0 % (ref 0.0–0.2)

## 2023-09-10 LAB — PHOSPHORUS: Phosphorus: 3.3 mg/dL (ref 2.5–4.6)

## 2023-09-10 LAB — BASIC METABOLIC PANEL
Anion gap: 11 (ref 5–15)
BUN: 12 mg/dL (ref 6–20)
CO2: 25 mmol/L (ref 22–32)
Calcium: 7.8 mg/dL — ABNORMAL LOW (ref 8.9–10.3)
Chloride: 95 mmol/L — ABNORMAL LOW (ref 98–111)
Creatinine, Ser: 1.57 mg/dL — ABNORMAL HIGH (ref 0.61–1.24)
GFR, Estimated: 58 mL/min — ABNORMAL LOW (ref >60)
Glucose, Bld: 116 mg/dL — ABNORMAL HIGH (ref 70–99)
Potassium: 3.4 mmol/L — ABNORMAL LOW (ref 3.5–5.1)
Sodium: 131 mmol/L — ABNORMAL LOW (ref 135–145)

## 2023-09-10 LAB — MAGNESIUM: Magnesium: 2.4 mg/dL (ref 1.7–2.4)

## 2023-09-10 MED ORDER — TRAMADOL HCL 50 MG PO TABS: 50.0000 mg | ORAL_TABLET | Freq: Four times a day (QID) | ORAL | Status: DC | PRN

## 2023-09-10 MED ORDER — FUROSEMIDE 10 MG/ML IJ SOLN
80.0000 mg | Freq: Once | INTRAMUSCULAR | Status: AC
  Administered 2023-09-10: 80 mg via INTRAVENOUS
  Filled 2023-09-10: qty 8

## 2023-09-10 MED ORDER — POTASSIUM CHLORIDE CRYS ER 20 MEQ PO TBCR
40.0000 meq | EXTENDED_RELEASE_TABLET | ORAL | Status: AC
  Administered 2023-09-10 (×3): 40 meq via ORAL
  Filled 2023-09-10 (×3): qty 2

## 2023-09-10 NOTE — Plan of Care (Signed)
Problem: Clinical Measurements: Goal: Will remain free from infection Outcome: Progressing   Problem: Activity: Goal: Risk for activity intolerance will decrease Outcome: Progressing   Problem: Nutrition: Goal: Adequate nutrition will be maintained Outcome: Progressing
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
## 2023-09-11 ENCOUNTER — Inpatient Hospital Stay (HOSPITAL_COMMUNITY): Payer: Managed Care, Other (non HMO)

## 2023-09-11 LAB — BASIC METABOLIC PANEL
Anion gap: 12 (ref 5–15)
BUN: 15 mg/dL (ref 6–20)
CO2: 29 mmol/L (ref 22–32)
Calcium: 8.7 mg/dL — ABNORMAL LOW (ref 8.9–10.3)
Chloride: 95 mmol/L — ABNORMAL LOW (ref 98–111)
Creatinine, Ser: 1.69 mg/dL — ABNORMAL HIGH (ref 0.61–1.24)
GFR, Estimated: 53 mL/min — ABNORMAL LOW (ref >60)
Glucose, Bld: 109 mg/dL — ABNORMAL HIGH (ref 70–99)
Potassium: 4.6 mmol/L (ref 3.5–5.1)
Sodium: 136 mmol/L (ref 135–145)

## 2023-09-11 LAB — CBC
HCT: 35 % — ABNORMAL LOW (ref 39.0–52.0)
Hemoglobin: 12.2 g/dL — ABNORMAL LOW (ref 13.0–17.0)
MCH: 33.9 pg (ref 26.0–34.0)
MCHC: 34.9 g/dL (ref 30.0–36.0)
MCV: 97.2 fL (ref 80.0–100.0)
Platelets: 65 10*3/uL — ABNORMAL LOW (ref 150–400)
RBC: 3.6 MIL/uL — ABNORMAL LOW (ref 4.22–5.81)
RDW: 13.4 % (ref 11.5–15.5)
WBC: 6.3 10*3/uL (ref 4.0–10.5)
nRBC: 0 % (ref 0.0–0.2)

## 2023-09-11 MED ORDER — ACETAMINOPHEN 325 MG PO TABS: 650.0000 mg | ORAL_TABLET | Freq: Four times a day (QID) | ORAL | Status: DC | PRN

## 2023-09-11 MED ORDER — FUROSEMIDE 10 MG/ML IJ SOLN
40.0000 mg | Freq: Once | INTRAMUSCULAR | Status: AC
  Administered 2023-09-11: 40 mg via INTRAVENOUS
  Filled 2023-09-11: qty 4
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
## 2023-09-11 NOTE — Progress Notes (Signed)
Discharge instructions given, patient with no complaints. Taken to private vehicle via [facility].
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
## 2024-01-16 ENCOUNTER — Other Ambulatory Visit: Payer: Self-pay

## 2024-01-16 ENCOUNTER — Encounter (HOSPITAL_COMMUNITY): Payer: Self-pay

## 2024-01-16 ENCOUNTER — Emergency Department (HOSPITAL_COMMUNITY)
Admission: EM | Admit: 2024-01-16 | Discharge: 2024-01-16 | Disposition: A | Discharge status: Home / Self Care | Payer: No Typology Code available for payment source | Attending: Emergency Medicine | Admitting: Emergency Medicine

## 2024-01-16 DIAGNOSIS — R109 Unspecified abdominal pain: Secondary | ICD-10-CM | POA: Diagnosis present

## 2024-01-16 DIAGNOSIS — R1084 Generalized abdominal pain: Secondary | ICD-10-CM | POA: Diagnosis not present

## 2024-01-16 DIAGNOSIS — D61818 Other pancytopenia: Secondary | ICD-10-CM | POA: Diagnosis not present

## 2024-01-16 DIAGNOSIS — D72819 Decreased white blood cell count, unspecified: Secondary | ICD-10-CM | POA: Insufficient documentation

## 2024-01-16 LAB — COMPREHENSIVE METABOLIC PANEL
ALT: 49 U/L — ABNORMAL HIGH (ref 0–44)
AST: 158 U/L — ABNORMAL HIGH (ref 15–41)
Albumin: 3.1 g/dL — ABNORMAL LOW (ref 3.5–5.0)
Alkaline Phosphatase: 103 U/L (ref 38–126)
Anion gap: 9 (ref 5–15)
BUN: <5 mg/dL — ABNORMAL LOW (ref 6–20)
CO2: 25 mmol/L (ref 22–32)
Calcium: 8.7 mg/dL — ABNORMAL LOW (ref 8.9–10.3)
Chloride: 100 mmol/L (ref 98–111)
Creatinine, Ser: 0.72 mg/dL (ref 0.61–1.24)
GFR, Estimated: >60 mL/min (ref >60)
Glucose, Bld: 114 mg/dL — ABNORMAL HIGH (ref 70–99)
Potassium: 4.1 mmol/L (ref 3.5–5.1)
Sodium: 134 mmol/L — ABNORMAL LOW (ref 135–145)
Total Bilirubin: 2.8 mg/dL — ABNORMAL HIGH (ref 0.0–1.2)
Total Protein: 6.7 g/dL (ref 6.5–8.1)

## 2024-01-16 LAB — URINALYSIS, ROUTINE W REFLEX MICROSCOPIC
Glucose, UA: NEGATIVE mg/dL
Hgb urine dipstick: NEGATIVE
Ketones, ur: NEGATIVE mg/dL
Leukocytes,Ua: NEGATIVE
Nitrite: NEGATIVE
Protein, ur: NEGATIVE mg/dL
Specific Gravity, Urine: 1.016 (ref 1.005–1.030)
pH: 8 (ref 5.0–8.0)

## 2024-01-16 LAB — CBC
HCT: 30.5 % — ABNORMAL LOW (ref 39.0–52.0)
Hemoglobin: 10.8 g/dL — ABNORMAL LOW (ref 13.0–17.0)
MCH: 37.4 pg — ABNORMAL HIGH (ref 26.0–34.0)
MCHC: 35.4 g/dL (ref 30.0–36.0)
MCV: 105.5 fL — ABNORMAL HIGH (ref 80.0–100.0)
Platelets: 20 10*3/uL — CL (ref 150–400)
RBC: 2.89 MIL/uL — ABNORMAL LOW (ref 4.22–5.81)
RDW: 13.7 % (ref 11.5–15.5)
WBC: 3.3 10*3/uL — ABNORMAL LOW (ref 4.0–10.5)
nRBC: 0 % (ref 0.0–0.2)

## 2024-01-16 LAB — LIPASE, BLOOD: Lipase: 93 U/L — ABNORMAL HIGH (ref 11–51)

## 2024-01-16 NOTE — ED Triage Notes (Signed)
Pt reports middle abdominal pain onset 2 days ago. He states he vomited yesterday. Denies other symptoms.

## 2024-01-16 NOTE — Discharge Instructions (Signed)
We evaluated you for your abdominal pain.  Your symptoms improved in the emergency department.  We recommended a CT scan, however since you are symptoms had resolved, you did not want to wait for this.  Your symptoms could be due to inflammation in your pancreas or inflammation in your stomach.  Both of these problems can be made worse by drinking alcohol.  Please try to cut down on your alcohol use.  We also noticed that your white blood cell count, red blood cell count, and platelets were low.  This could also be due to your alcohol use, but we would recommend following up with a hematologist.  We have placed the referral for this.  They will call you for follow-up appointment.  Please return if your pain returns, or you develop any new symptoms such as vomiting, chest pain, shortness of breath, lightheadedness or dizziness, headaches, or any other new symptoms.

## 2024-01-17 ENCOUNTER — Encounter (INDEPENDENT_AMBULATORY_CARE_PROVIDER_SITE_OTHER): Payer: Self-pay | Admitting: Pediatric Genetics
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
## 2024-01-17 NOTE — Progress Notes (Signed)
    MEDICAL GENETICS BRIEF NOTE  Joey's daughter, Jodean Lima, was seen in my genetics clinic. She was found to have a pathogenic 15q26.3 deletion. This deletion contains the IGF1R gene and therefore, can cause small size (short stature, slow weight gain) due to insulin-like growth factor I resistance. Individuals may also have other health and developmental concerns.  Verbon is interested in being tested for this deletion to see if it was paternally inherited. If so, this may have implications for his own health and reproductive recurrence risk so it would be important for him to know.  Buccal swab obtained during his daughter's Genetics visit on 01/16/2024 for this testing and sent to [facility]. He asked that I contact his stepmother Marcelino Duster) with results.   Loletha Grayer, DO [facility] Pediatric Genetics

## 2024-01-31 ENCOUNTER — Emergency Department (HOSPITAL_COMMUNITY)
Admission: EM | Admit: 2024-01-31 | Discharge: 2024-01-31 | Disposition: A | Discharge status: Home / Self Care | Payer: Self-pay | Attending: Emergency Medicine | Admitting: Emergency Medicine

## 2024-01-31 ENCOUNTER — Encounter (HOSPITAL_COMMUNITY): Payer: Self-pay | Admitting: Emergency Medicine

## 2024-01-31 ENCOUNTER — Telehealth: Payer: Self-pay | Admitting: Orthopedic Surgery

## 2024-01-31 ENCOUNTER — Emergency Department (HOSPITAL_COMMUNITY): Payer: Self-pay

## 2024-01-31 DIAGNOSIS — W231XXA Caught, crushed, jammed, or pinched between stationary objects, initial encounter: Secondary | ICD-10-CM | POA: Insufficient documentation

## 2024-01-31 DIAGNOSIS — Z23 Encounter for immunization: Secondary | ICD-10-CM | POA: Insufficient documentation

## 2024-01-31 DIAGNOSIS — S62522A Displaced fracture of distal phalanx of left thumb, initial encounter for closed fracture: Secondary | ICD-10-CM | POA: Insufficient documentation

## 2024-01-31 DIAGNOSIS — S6992XA Unspecified injury of left wrist, hand and finger(s), initial encounter: Secondary | ICD-10-CM | POA: Diagnosis present

## 2024-01-31 DIAGNOSIS — S68012A Complete traumatic metacarpophalangeal amputation of left thumb, initial encounter: Secondary | ICD-10-CM

## 2024-01-31 LAB — CBC
HCT: 30.2 % — ABNORMAL LOW (ref 39.0–52.0)
Hemoglobin: 10.3 g/dL — ABNORMAL LOW (ref 13.0–17.0)
MCH: 37.1 pg — ABNORMAL HIGH (ref 26.0–34.0)
MCHC: 34.1 g/dL (ref 30.0–36.0)
MCV: 108.6 fL — ABNORMAL HIGH (ref 80.0–100.0)
Platelets: 29 10*3/uL — CL (ref 150–400)
RBC: 2.78 MIL/uL — ABNORMAL LOW (ref 4.22–5.81)
RDW: 13 % (ref 11.5–15.5)
WBC: 2.7 10*3/uL — ABNORMAL LOW (ref 4.0–10.5)
nRBC: 0 % (ref 0.0–0.2)

## 2024-01-31 MED ORDER — CEFAZOLIN SODIUM-DEXTROSE 2-4 GM/100ML-% IV SOLN
2.0000 g | Freq: Once | INTRAVENOUS | Status: AC
  Administered 2024-01-31: 2 g via INTRAVENOUS
  Filled 2024-01-31: qty 100

## 2024-01-31 MED ORDER — TETANUS-DIPHTH-ACELL PERTUSSIS 5-2.5-18.5 LF-MCG/0.5 IM SUSY
0.5000 mL | PREFILLED_SYRINGE | Freq: Once | INTRAMUSCULAR | Status: AC
  Administered 2024-01-31: 0.5 mL via INTRAMUSCULAR
  Filled 2024-01-31: qty 0.5

## 2024-01-31 MED ORDER — FENTANYL CITRATE PF 50 MCG/ML IJ SOSY
50.0000 ug | PREFILLED_SYRINGE | Freq: Once | INTRAMUSCULAR | Status: AC
  Administered 2024-01-31: 50 ug via INTRAVENOUS
  Filled 2024-01-31: qty 1

## 2024-01-31 MED ORDER — CEPHALEXIN 500 MG PO CAPS: 500.0000 mg | ORAL_CAPSULE | Freq: Three times a day (TID) | ORAL | 0 refills | Status: DC

## 2024-01-31 MED ORDER — LIDOCAINE HCL (PF) 1 % IJ SOLN
5.0000 mL | Freq: Once | INTRAMUSCULAR | Status: AC
  Administered 2024-01-31: 5 mL
  Filled 2024-01-31: qty 5

## 2024-01-31 MED ORDER — HYDROCODONE-ACETAMINOPHEN 5-325 MG PO TABS
1.0000 | ORAL_TABLET | ORAL | 0 refills | Status: DC | PRN
Start: 2024-01-31 — End: 2024-02-04

## 2024-01-31 NOTE — Telephone Encounter (Signed)
Tried reaching patient;  The main number listed is his step mothers number. She was not with him at the time. She gave me 2 other numbers to try, neither are listed. Both numbers unsuccessful. [phone]  [phone]  Will try again, patient needs to come in Monday or Tuesday per Dr. Fara Boros

## 2024-01-31 NOTE — ED Provider Notes (Cosign Needed Addendum)
 [place] EMERGENCY DEPARTMENT AT [facility] Provider Note   CSN: [id number] Arrival date & time: 01/31/24  1204     History  Chief Complaint  Patient presents with   Hand Injury    Gregory Murphy is a 38 y.o. male with medical history of insomnia, depression.  The patient presents to the ED for evaluation of hand injury.  Reports that he was using a wood splitter prior to arrival when he accidentally slipped and his left thumb was caught into the mechanism.  The patient has a traumatic partial amputation to the distal tip of his left thumb.  He is unsure of his last tetanus update.  He reports he did drink 1 alcoholic beverage, tallboy beer, this morning.  Denies any drug use.  Denies any history of hand surgery on his left hand.  Reports pain is 10 out of 10.  Unsure of amount of blood loss on scene.  Reports that his stepmother drove him to the hospital.  Denies lightheadedness, dizziness or weakness.   Hand Injury      Home Medications Prior to Admission medications   Medication Sig Start Date End Date Taking? Authorizing Provider  cephALEXin (KEFLEX) 500 MG capsule Take 1 capsule (500 mg total) by mouth 3 (three) times daily. 01/31/24  Yes Al Decant, PA-C  HYDROcodone-acetaminophen (NORCO/VICODIN) 5-325 MG tablet Take 1 tablet by mouth every 4 (four) hours as needed. 01/31/24  Yes Al Decant, PA-C      Allergies    Patient has no known allergies.    Review of Systems   Review of Systems  Skin:  Positive for wound.  Neurological:  Negative for dizziness, weakness and light-headedness.  All other systems reviewed and are negative.   Physical Exam Updated Vital Signs BP (!) 135/91   Pulse 94   Resp 16   Ht 5\' 1"  (1.549 m)   Wt 50 kg   SpO2 95%   BMI 20.83 kg/m  Physical Exam Vitals and nursing note reviewed.  Constitutional:      General: He is not in acute distress.    Appearance: He is well-developed.  HENT:     Head:  Normocephalic and atraumatic.  Eyes:     Conjunctiva/sclera: Conjunctivae normal.  Cardiovascular:     Rate and Rhythm: Normal rate and regular rhythm.     Heart sounds: No murmur heard. Pulmonary:     Effort: Pulmonary effort is normal. No respiratory distress.     Breath sounds: Normal breath sounds.  Abdominal:     Palpations: Abdomen is soft.     Tenderness: There is no abdominal tenderness.  Musculoskeletal:        General: No swelling.     Cervical back: Neck supple.     Comments: Macerated, amputated distal tip of left thumb with nailbed involvement and exposed bone.  Patient has full flexion and extension of the left thumb.  He has a 2+ radial pulse into the left hand.  He has intact sensation to the left thumb.  Skin:    General: Skin is warm and dry.     Capillary Refill: Capillary refill takes less than 2 seconds.  Neurological:     Mental Status: He is alert.  Psychiatric:        Mood and Affect: Mood normal.          ED Results / Procedures / Treatments   Labs (all labs ordered are listed, but only abnormal results are  displayed) Labs Reviewed  CBC - Abnormal; Notable for the following components:      Result Value   WBC 2.7 (*)    RBC 2.78 (*)    Hemoglobin 10.3 (*)    HCT 30.2 (*)    MCV 108.6 (*)    MCH 37.1 (*)    Platelets 29 (*)    All other components within normal limits    EKG None  Radiology No results found.  Procedures Procedures    Medications Ordered in ED Medications  fentaNYL (SUBLIMAZE) injection 50 mcg (50 mcg Intravenous Given 01/31/24 1337)  Tdap (BOOSTRIX) injection 0.5 mL (0.5 mLs Intramuscular Given 01/31/24 1337)  lidocaine (PF) (XYLOCAINE) 1 % injection 5 mL (5 mLs Other Given 01/31/24 1452)  ceFAZolin (ANCEF) IVPB 2g/100 mL premix (0 g Intravenous Stopped 01/31/24 1452)    ED Course/ Medical Decision Making/ A&P Clinical Course as of 01/31/24 1536  Fri Jan 31, 2024  1447 [id number] [CG]  1451 Pressure dressing.  OR under tourniquet control. Xerofrom, gauze, thick wrap. Might need reinforcement. Planned surgery for next week. See in office on Monday, formal washout.  [CG]    Clinical Course User Index [CG] Al Decant, PA-C    Medical Decision Making Amount and/or Complexity of Data Reviewed Labs: ordered. Radiology: ordered.  Risk Prescription drug management.   38 year old male presents for evaluation.  Please see HPI for further details.  On examination the patient is afebrile and nontachycardic.  His lung sounds are clear bilaterally, he is not hypoxic.  His abdomen is soft and compressible throughout.  Neurological examination is baseline.  Patient does have a traumatically amputated left distal thumb with nailbed involvement, exposed bone.  Suspect underlying distal phalanx fracture.  Full flexion and extension of the left thumb was appreciated.  Will collect CBC, x-ray imaging of left thumb, tetanus update.  Provided patient 50 mcg fentanyl pain control, provide patient with 1 g of Ancef due to open fracture.  Patient CBC without leukocytosis, baseline hemoglobin 10.3.  Patient platelets are decreased to 29 however patient has history of thrombocytopenia, reports that he has seen hematology for this.  Patient left thumb was irrigated with 1 L of sterile water.  Plain film imaging shows fracture of the distal left phalanx of the thumb.  Spoke with Dr. Denese Killings of hand surgery.  Dr. Denese Killings has instructed me to apply pressure dressing to the patient left thumb.  This was done.  Applied Xeroform, gauze, tight pressure Coban.  Patient was advised that if there is strikethrough or bleeding through his gauze he will need to return to the ED and he voiced understanding.  Patient will follow-up in the office with Dr. Denese Killings on Monday.  Patient will be sent home with antibiotics and pain medication.    Patient does have a history of opioid overdose in his chart, I had a long lengthy  discussion with the patient regarding proper use of narcotics.  I advised him that he should only take his medications as prescribed and that there will be residual pain no matter how much pain medication he takes and he voiced understanding with my instructions.  He will follow-up in the office on Monday with Dr. Denese Killings.  I advised the patient of strict return precautions and he voiced understanding.  Final Clinical Impression(s) / ED Diagnoses Final diagnoses:  Closed displaced fracture of distal phalanx of left thumb, initial encounter  Amputation of left thumb, initial encounter    Rx / DC Orders  ED Discharge Orders          Ordered    HYDROcodone-acetaminophen (NORCO/VICODIN) 5-325 MG tablet  Every 4 hours PRN        01/31/24 1535    cephALEXin (KEFLEX) 500 MG capsule  3 times daily        01/31/24 1535              Clent Ridges 01/31/24 1536    Terrilee Files, MD 01/31/24 1745    Al Decant, PA-C 02/01/24 1444    Terrilee Files, MD 02/02/24 [phone]

## 2024-01-31 NOTE — Discharge Instructions (Addendum)
It was a pleasure taking part in your care.  As discussed, you will see Dr. Denese Killings in the office on Monday.  Please call his office and make an appointment to be seen on Monday morning.  They are aware that you will be coming.  Please begin taking Keflex 3 times a day until seen by Dr. Denese Killings.  You will start this medication today.  Please take pain medication every 6 hours as needed.  Please take only one 5 mg hydrocodone every 6 hours.  Please do not take more than the indicated amount as we discussed.  Please return to the ED if you have any strikethrough of your bandaging, bleeding through or other concerning new symptoms.

## 2024-01-31 NOTE — ED Triage Notes (Signed)
Pt cut left thumb on wood splitter. Tip of thumb is missing.

## 2024-01-31 NOTE — Plan of Care (Signed)
HAND SURGERY UPDATE NOTE:  Spoke with emergency department physician about patient with left thumb distal tip injury status post wood splitter accident.  Reviewed the images and pictures in the chart regarding the tip of the left thumb injury.  Patient appears to have a distal tuft fracture with notable soft tissue disruption and nailbed injury.  Recommended pressure dressing for now, discharged with pain medication and antibiotics.  I will plan on seeing him back for follow-up in the outpatient setting on Monday.  Patient will require formal irrigation debridement with nailbed repair and likely fracture pending versus possible revision amputation.  [facility], Hand Surgery

## 2024-02-03 ENCOUNTER — Ambulatory Visit (INDEPENDENT_AMBULATORY_CARE_PROVIDER_SITE_OTHER): Payer: Self-pay | Admitting: Orthopedic Surgery

## 2024-02-03 ENCOUNTER — Other Ambulatory Visit: Payer: Self-pay

## 2024-02-03 ENCOUNTER — Encounter (HOSPITAL_BASED_OUTPATIENT_CLINIC_OR_DEPARTMENT_OTHER): Payer: Self-pay | Admitting: Orthopedic Surgery

## 2024-02-03 DIAGNOSIS — S6992XA Unspecified injury of left wrist, hand and finger(s), initial encounter: Secondary | ICD-10-CM

## 2024-02-03 DIAGNOSIS — S62522B Displaced fracture of distal phalanx of left thumb, initial encounter for open fracture: Secondary | ICD-10-CM

## 2024-02-03 NOTE — Progress Notes (Signed)
 Gregory Murphy - 38 y.o. male MRN [id number]  Date of birth: [birth year]  Office Visit Note: Visit Date: 02/03/2024 PCP: Patient, [name] Referred by: No ref. provider found  Subjective: No chief complaint on file.  HPI: Gregory Murphy is a pleasant 38 y.o. male who presents today for evaluation of a left thumb injury.  Injury mechanism described as a wood splitter, accidentally slipped and left thumb was caught into the mechanism.  He sustained a traumatic partial amputation at the distal tip of the left thumb with an associated distal phalanx fracture with comminution and nailbed injury.  He is right-hand dominant.  Patient has a history of thrombocytopenia.  Pertinent ROS were reviewed with the patient and found to be negative unless otherwise specified above in HPI.   Visit Reason: left thumb  Duration of symptoms: 01/31/24 Hand dominance: right Occupation: unemployed Diabetic: No Smoking: Yes Heart/Lung History: none Blood Thinners: none  Prior Testing/EMG: 01/31/24 Injections (Date): none Treatments: antibiotics, splint  Assessment & Plan: Visit Diagnoses: No diagnosis found.  Plan: Patient has sustained a significant traumatic injury to the distal aspect of the left thumb with an open distal phalanx fracture and associated nailbed injury.  We discussed the open nature of this injury and the need for formal irrigation debridement stabilization.  I did explain to him the significant trauma to the nailbed which has been sustained which may preclude the ability for a new nail to regrow in this region.  Based upon his injury and workup, he is indicated for operative washout of the left thumb open distal phalanx fracture with open versus closed reduction and pinning, possible nailbed repair.  Risks and benefits of the procedure were discussed, risks including but not limited to infection, bleeding, scarring, stiffness, nerve injury, tendon injury, vascular injury,  hardware complication, malunion, nonunion, nail deformity, lack of nail regrowth, recurrence of symptoms and need for subsequent operation.  Patient expressed understanding.  We will move forward with surgical scheduling of left thumb irrigation debridement, closed versus open reduction of the distal phalanx fracture with pinning and possible nailbed repair to be performed urgently, this will be performed tomorrow in the outpatient surgical setting.    Follow-up: No follow-ups on file.   Meds & Orders: No orders of the defined types were placed in this encounter.  No orders of the defined types were placed in this encounter.    Procedures: No procedures performed      Clinical History: No specialty comments available.  He reports that he has been smoking cigarettes. He has a 7 pack-year smoking history. He has never used smokeless tobacco. No results for input(s): "HGBA1C", "LABURIC" in the last 8760 hours.  Objective:   Vital Signs: There were no vitals taken for this visit.  Physical Exam  Gen: Well-appearing, in no acute distress; non-toxic CV: Regular Rate. Well-perfused. Warm.  Resp: Breathing unlabored on room air; no wheezing. Psych: Fluid speech in conversation; appropriate affect; normal thought process  Ortho Exam Left thumb: - Significant trauma sustained to the distal aspect of the thumb with notable soft tissue disruption at the level of the nailbed, thumb remains well-perfused distally - Notable injury to the nailbed, eponychial fold is disrupted, nail plate has been avulsed - Flexion and extension of the IP joint is maintained - Numbness appreciated at the distal aspect of the thumb pulp  Imaging: Prior left thumb x-rays from the emergency department setting were reviewed today  Past Medical/Family/Surgical/Social History: Medications &  Allergies reviewed per EMR, new medications updated. Patient Active Problem List   Diagnosis Date Noted   Splenic rupture  09/07/2023   Right tibial fracture 12/22/2021   Malnutrition of moderate degree 12/21/2021   Femur fracture (HCC) 12/17/2021   Critical polytrauma 12/17/2021   Past Medical History:  Diagnosis Date   Brain injury (HCC)    Depression    Insomnia    Panic attack    No family history on file. Past Surgical History:  Procedure Laterality Date   ABDOMINAL SURGERY     DISTAL FEMUR BONE BRIDGE EXCISION Left    FEMUR CLOSED REDUCTION Left 12/17/2021   Procedure: CLOSED REDUCTION FEMORAL SHAFT;  Surgeon: [name], MD;  Location: [facility] OR;  Service: Orthopedics;  Laterality: Left;   FOREARM SURGERY Left    x4-metal rods placed   LEG SURGERY Right 2023   ORIF FEMUR FRACTURE Left 12/20/2021   Procedure: OPEN REDUCTION INTERNAL FIXATION (ORIF) DISTAL FEMUR FRACTURE;  Surgeon: Roby Lofts, MD;  Location: [facility] OR;  Service: Orthopedics;  Laterality: Left;   TIBIA IM NAIL INSERTION Bilateral 12/17/2021   Procedure: RIGHT INTRAMEDULLARY (IM) NAIL TIBIAL; RIGHT CLOSED TREATMENT OF FIBULA FRACTURE; LEFT CLOSED REDUCTION SPLINTING OF PERIPROSTHETIC  SUPRACONDYLAR FEMUR FRACTURE;  Surgeon: [name], MD;  Location: [facility] OR;  Service: Orthopedics;  Laterality: Bilateral;   WRIST ARTHROSCOPY WITH CARPOMETACARPEL [facility]) ARTHROPLASTY Left 03/21/2022   Procedure: left wrist arthroscopy with debridement as needed;  Surgeon: Dairl Ponder, MD;  Location: [facility];  Service: Orthopedics;  Laterality: Left;  just needs 60 mins for surgery   WRIST SURGERY Right    pins   Social History   Occupational History   Not on file  Tobacco Use   Smoking status: Every Day    Current packs/day: 0.50    Average packs/day: 0.5 packs/day for 14.0 years (7.0 ttl pk-yrs)    Types: Cigarettes   Smokeless tobacco: Never  Vaping Use   Vaping status: Some Days  Substance and Sexual Activity   Alcohol use: Not Currently    Alcohol/week: 28.0 standard drinks of alcohol    Types: 28 Cans  of beer per week    Comment: admits to drinking today 4-5   Drug use: No   Sexual activity: Yes    Birth control/protection: Condom    Klea Nall Fara Boros) Denese Killings, M.D. [facility], Hand Surgery

## 2024-02-03 NOTE — H&P (View-Only) (Signed)
 Gregory Murphy - 38 y.o. male MRN [id number]  Date of birth: [birth year]  Office Visit Note: Visit Date: 02/03/2024 PCP: Patient, [name] Referred by: No ref. provider found  Subjective: No chief complaint on file.  HPI: Gregory Murphy is a pleasant 38 y.o. male who presents today for evaluation of a left thumb injury.  Injury mechanism described as a wood splitter, accidentally slipped and left thumb was caught into the mechanism.  He sustained a traumatic partial amputation at the distal tip of the left thumb with an associated distal phalanx fracture with comminution and nailbed injury.  He is right-hand dominant.  Patient has a history of thrombocytopenia.  Pertinent ROS were reviewed with the patient and found to be negative unless otherwise specified above in HPI.   Visit Reason: left thumb  Duration of symptoms: 01/31/24 Hand dominance: right Occupation: unemployed Diabetic: No Smoking: Yes Heart/Lung History: none Blood Thinners: none  Prior Testing/EMG: 01/31/24 Injections (Date): none Treatments: antibiotics, splint  Assessment & Plan: Visit Diagnoses: No diagnosis found.  Plan: Patient has sustained a significant traumatic injury to the distal aspect of the left thumb with an open distal phalanx fracture and associated nailbed injury.  We discussed the open nature of this injury and the need for formal irrigation debridement stabilization.  I did explain to him the significant trauma to the nailbed which has been sustained which may preclude the ability for a new nail to regrow in this region.  Based upon his injury and workup, he is indicated for operative washout of the left thumb open distal phalanx fracture with open versus closed reduction and pinning, possible nailbed repair.  Risks and benefits of the procedure were discussed, risks including but not limited to infection, bleeding, scarring, stiffness, nerve injury, tendon injury, vascular injury,  hardware complication, malunion, nonunion, nail deformity, lack of nail regrowth, recurrence of symptoms and need for subsequent operation.  Patient expressed understanding.  We will move forward with surgical scheduling of left thumb irrigation debridement, closed versus open reduction of the distal phalanx fracture with pinning and possible nailbed repair to be performed urgently, this will be performed tomorrow in the outpatient surgical setting.    Follow-up: No follow-ups on file.   Meds & Orders: No orders of the defined types were placed in this encounter.  No orders of the defined types were placed in this encounter.    Procedures: No procedures performed      Clinical History: No specialty comments available.  He reports that he has been smoking cigarettes. He has a 7 pack-year smoking history. He has never used smokeless tobacco. No results for input(s): "HGBA1C", "LABURIC" in the last 8760 hours.  Objective:   Vital Signs: There were no vitals taken for this visit.  Physical Exam  Gen: Well-appearing, in no acute distress; non-toxic CV: Regular Rate. Well-perfused. Warm.  Resp: Breathing unlabored on room air; no wheezing. Psych: Fluid speech in conversation; appropriate affect; normal thought process  Ortho Exam Left thumb: - Significant trauma sustained to the distal aspect of the thumb with notable soft tissue disruption at the level of the nailbed, thumb remains well-perfused distally - Notable injury to the nailbed, eponychial fold is disrupted, nail plate has been avulsed - Flexion and extension of the IP joint is maintained - Numbness appreciated at the distal aspect of the thumb pulp  Imaging: Prior left thumb x-rays from the emergency department setting were reviewed today  Past Medical/Family/Surgical/Social History: Medications &  Allergies reviewed per EMR, new medications updated. Patient Active Problem List   Diagnosis Date Noted   Splenic rupture  09/07/2023   Right tibial fracture 12/22/2021   Malnutrition of moderate degree 12/21/2021   Femur fracture (HCC) 12/17/2021   Critical polytrauma 12/17/2021   Past Medical History:  Diagnosis Date   Brain injury (HCC)    Depression    Insomnia    Panic attack    No family history on file. Past Surgical History:  Procedure Laterality Date   ABDOMINAL SURGERY     DISTAL FEMUR BONE BRIDGE EXCISION Left    FEMUR CLOSED REDUCTION Left 12/17/2021   Procedure: CLOSED REDUCTION FEMORAL SHAFT;  Surgeon: [name], MD;  Location: [facility] OR;  Service: Orthopedics;  Laterality: Left;   FOREARM SURGERY Left    x4-metal rods placed   LEG SURGERY Right 2023   ORIF FEMUR FRACTURE Left 12/20/2021   Procedure: OPEN REDUCTION INTERNAL FIXATION (ORIF) DISTAL FEMUR FRACTURE;  Surgeon: Roby Lofts, MD;  Location: [facility] OR;  Service: Orthopedics;  Laterality: Left;   TIBIA IM NAIL INSERTION Bilateral 12/17/2021   Procedure: RIGHT INTRAMEDULLARY (IM) NAIL TIBIAL; RIGHT CLOSED TREATMENT OF FIBULA FRACTURE; LEFT CLOSED REDUCTION SPLINTING OF PERIPROSTHETIC  SUPRACONDYLAR FEMUR FRACTURE;  Surgeon: [name], MD;  Location: [facility] OR;  Service: Orthopedics;  Laterality: Bilateral;   WRIST ARTHROSCOPY WITH CARPOMETACARPEL [facility]) ARTHROPLASTY Left 03/21/2022   Procedure: left wrist arthroscopy with debridement as needed;  Surgeon: Dairl Ponder, MD;  Location: [facility];  Service: Orthopedics;  Laterality: Left;  just needs 60 mins for surgery   WRIST SURGERY Right    pins   Social History   Occupational History   Not on file  Tobacco Use   Smoking status: Every Day    Current packs/day: 0.50    Average packs/day: 0.5 packs/day for 14.0 years (7.0 ttl pk-yrs)    Types: Cigarettes   Smokeless tobacco: Never  Vaping Use   Vaping status: Some Days  Substance and Sexual Activity   Alcohol use: Not Currently    Alcohol/week: 28.0 standard drinks of alcohol    Types: 28 Cans  of beer per week    Comment: admits to drinking today 4-5   Drug use: No   Sexual activity: Yes    Birth control/protection: Condom    Klea Nall Fara Boros) Denese Killings, M.D. [facility], Hand Surgery

## 2024-02-03 NOTE — Progress Notes (Signed)
   02/03/24 1102  Pre-op Phone Call  Surgery Date Verified 02/04/24  Arrival Time Verified 1100  Surgery Location Verified [facility]  Medical History Reviewed Yes  Is the patient taking a GLP-1 receptor agonist? No  Does the patient have diabetes? No diagnosis of diabetes  Do you have a history of heart problems? No  Does patient have other implanted devices? No  Patient Teaching Enhanced Recovery;Pre / Post Procedure  Patient educated about smoking cessation 24 hours prior to surgery. (S)  Yes  Patient verbalizes understanding of bowel prep? N/A  THA/TKA patients only:  By your surgery date, will you have been taking narcotics for 90 days or greater? Yes  Med Rec Completed Yes  Take the Following Meds the Morning of Surgery none  Recent  Lab Work, EKG, CXR? Yes  NPO (Including gum & candy) After midnight  Allowed clear liquids Water;Gatorade  (diabetics please choose diet or no sugar options)  Patient instructed to stop clear liquids including Carb loading drink at: 1000  Stop Solids, Milk, Candy, and Gum STARTING AT MIDNIGHT  Responsible adult to drive and be with you for 24 hours? Yes  Name & Phone Number for Ride/Caregiver friend  No Jewelry, money, nail polish or make-up.  No lotions, powders, perfumes. No shaving  48 hrs. prior to surgery. Yes  Contacts, Dentures & Glasses Will Have to be Removed Before OR. Yes  Please bring your ID and Insurance Card the morning of your surgery. (Surgery Centers Only) Yes  Bring any papers or x-rays with you that your surgeon gave you. Yes  Instructed to contact the location of procedure/ provider if they or anyone in their household develops symptoms or tests positive for COVID-19, has close contact with someone who tests positive for COVID, or has known exposure to any contagious illness. Yes  Covid-19 Assessment  Have you had a positive COVID-19 test within the previous 90 days? No  COVID Testing Guidance Proceed with the additional questions.   Patient's surgery required a COVID-19 test (cardiothoracic, complex ENT, and bronchoscopies/ EBUS) No  Have you been unmasked and in close contact with anyone with COVID-19 or COVID-19 symptoms within the past 10 days? No  Do you or anyone in your household currently have any COVID-19 symptoms? No   Discussed with Dr. Tacy Dura and okay for day surgery

## 2024-02-04 ENCOUNTER — Encounter (HOSPITAL_BASED_OUTPATIENT_CLINIC_OR_DEPARTMENT_OTHER)
Admission: RE | Disposition: A | Discharge status: Home / Self Care | Payer: Self-pay | Source: Home / Self Care | Attending: Orthopedic Surgery

## 2024-02-04 ENCOUNTER — Ambulatory Visit (HOSPITAL_BASED_OUTPATIENT_CLINIC_OR_DEPARTMENT_OTHER): Payer: MEDICAID | Admitting: Anesthesiology

## 2024-02-04 ENCOUNTER — Encounter (HOSPITAL_BASED_OUTPATIENT_CLINIC_OR_DEPARTMENT_OTHER): Payer: Self-pay | Admitting: Orthopedic Surgery

## 2024-02-04 ENCOUNTER — Ambulatory Visit (HOSPITAL_BASED_OUTPATIENT_CLINIC_OR_DEPARTMENT_OTHER): Payer: MEDICAID

## 2024-02-04 ENCOUNTER — Other Ambulatory Visit: Payer: Self-pay

## 2024-02-04 ENCOUNTER — Ambulatory Visit (HOSPITAL_BASED_OUTPATIENT_CLINIC_OR_DEPARTMENT_OTHER)
Admission: RE | Admit: 2024-02-04 | Discharge: 2024-02-04 | Disposition: A | Discharge status: Home / Self Care | Payer: MEDICAID | Attending: Orthopedic Surgery | Admitting: Orthopedic Surgery

## 2024-02-04 DIAGNOSIS — W3189XA Contact with other specified machinery, initial encounter: Secondary | ICD-10-CM | POA: Diagnosis not present

## 2024-02-04 DIAGNOSIS — D696 Thrombocytopenia, unspecified: Secondary | ICD-10-CM | POA: Insufficient documentation

## 2024-02-04 DIAGNOSIS — S62522B Displaced fracture of distal phalanx of left thumb, initial encounter for open fracture: Secondary | ICD-10-CM

## 2024-02-04 DIAGNOSIS — S62522A Displaced fracture of distal phalanx of left thumb, initial encounter for closed fracture: Secondary | ICD-10-CM

## 2024-02-04 DIAGNOSIS — S6992XA Unspecified injury of left wrist, hand and finger(s), initial encounter: Secondary | ICD-10-CM

## 2024-02-04 HISTORY — PX: I & D EXTREMITY: SHX5045

## 2024-02-04 HISTORY — PX: NAILBED REPAIR: SHX5028

## 2024-02-04 HISTORY — PX: CLOSED REDUCTION FINGER WITH PERCUTANEOUS PINNING: SHX5612

## 2024-02-04 SURGERY — CLOSED REDUCTION, FINGER, WITH PERCUTANEOUS PINNING
Anesthesia: Monitor Anesthesia Care | Site: Finger | Laterality: Left

## 2024-02-04 MED ORDER — DEXMEDETOMIDINE HCL IN NACL 80 MCG/20ML IV SOLN
INTRAVENOUS | Status: DC | PRN
  Administered 2024-02-04 (×2): 4 ug via INTRAVENOUS

## 2024-02-04 MED ORDER — LIDOCAINE 2% (20 MG/ML) 5 ML SYRINGE
INTRAMUSCULAR | Status: AC
  Filled 2024-02-04: qty 5

## 2024-02-04 MED ORDER — OXYCODONE HCL 5 MG PO TABS: 5.0000 mg | ORAL_TABLET | Freq: Once | ORAL | Status: DC | PRN

## 2024-02-04 MED ORDER — MIDAZOLAM HCL 2 MG/2ML IJ SOLN
INTRAMUSCULAR | Status: AC
Start: 2024-02-04 — End: ?
  Filled 2024-02-04: qty 2

## 2024-02-04 MED ORDER — HYDROCODONE-ACETAMINOPHEN 5-325 MG PO TABS: 1.0000 | ORAL_TABLET | Freq: Four times a day (QID) | ORAL | 0 refills | Status: DC | PRN

## 2024-02-04 MED ORDER — LIDOCAINE HCL 1 % IJ SOLN
INTRAMUSCULAR | Status: DC | PRN
  Administered 2024-02-04: 10 mL

## 2024-02-04 MED ORDER — PROPOFOL 500 MG/50ML IV EMUL
INTRAVENOUS | Status: AC
  Filled 2024-02-04: qty 50

## 2024-02-04 MED ORDER — ONDANSETRON HCL 4 MG/2ML IJ SOLN
INTRAMUSCULAR | Status: DC | PRN
  Administered 2024-02-04: 4 mg via INTRAVENOUS

## 2024-02-04 MED ORDER — SODIUM CHLORIDE 0.9 % IV SOLN: INTRAVENOUS | Status: DC | PRN

## 2024-02-04 MED ORDER — FENTANYL CITRATE (PF) 100 MCG/2ML IJ SOLN
INTRAMUSCULAR | Status: AC
  Filled 2024-02-04: qty 2

## 2024-02-04 MED ORDER — PROPOFOL 10 MG/ML IV BOLUS
INTRAVENOUS | Status: AC
  Filled 2024-02-04: qty 20

## 2024-02-04 MED ORDER — FENTANYL CITRATE (PF) 100 MCG/2ML IJ SOLN
INTRAMUSCULAR | Status: DC | PRN
Start: 2024-02-04 — End: 2024-02-04
  Administered 2024-02-04 (×2): 25 ug via INTRAVENOUS

## 2024-02-04 MED ORDER — FENTANYL CITRATE (PF) 100 MCG/2ML IJ SOLN: 25.0000 ug | INTRAMUSCULAR | Status: DC | PRN

## 2024-02-04 MED ORDER — 0.9 % SODIUM CHLORIDE (POUR BTL) OPTIME
TOPICAL | Status: DC | PRN
Start: 2024-02-04 — End: 2024-02-04
  Administered 2024-02-04: 400 mL

## 2024-02-04 MED ORDER — PROPOFOL 500 MG/50ML IV EMUL
INTRAVENOUS | Status: DC | PRN
  Administered 2024-02-04: 75 ug/kg/min via INTRAVENOUS

## 2024-02-04 MED ORDER — PROPOFOL 10 MG/ML IV BOLUS
INTRAVENOUS | Status: DC | PRN
  Administered 2024-02-04: 30 mg via INTRAVENOUS
  Administered 2024-02-04: 50 mg via INTRAVENOUS

## 2024-02-04 MED ORDER — OXYCODONE HCL 5 MG/5ML PO SOLN: 5.0000 mg | Freq: Once | ORAL | Status: DC | PRN

## 2024-02-04 MED ORDER — CEFAZOLIN SODIUM-DEXTROSE 2-4 GM/100ML-% IV SOLN
INTRAVENOUS | Status: AC
  Filled 2024-02-04: qty 100

## 2024-02-04 MED ORDER — ONDANSETRON HCL 4 MG/2ML IJ SOLN: 4.0000 mg | Freq: Four times a day (QID) | INTRAMUSCULAR | Status: DC | PRN

## 2024-02-04 MED ORDER — DEXAMETHASONE SODIUM PHOSPHATE 10 MG/ML IJ SOLN
INTRAMUSCULAR | Status: AC
  Filled 2024-02-04: qty 1

## 2024-02-04 MED ORDER — CEFAZOLIN SODIUM-DEXTROSE 2-4 GM/100ML-% IV SOLN
2.0000 g | INTRAVENOUS | Status: AC
  Administered 2024-02-04: 2 g via INTRAVENOUS

## 2024-02-04 MED ORDER — MIDAZOLAM HCL 2 MG/2ML IJ SOLN
INTRAMUSCULAR | Status: AC
  Filled 2024-02-04: qty 2

## 2024-02-04 MED ORDER — MIDAZOLAM HCL 5 MG/5ML IJ SOLN
INTRAMUSCULAR | Status: DC | PRN
  Administered 2024-02-04 (×2): 1 mg via INTRAVENOUS
  Administered 2024-02-04: 2 mg via INTRAVENOUS

## 2024-02-04 MED ORDER — ONDANSETRON HCL 4 MG/2ML IJ SOLN
INTRAMUSCULAR | Status: AC
  Filled 2024-02-04: qty 2

## 2024-02-04 MED ORDER — LIDOCAINE HCL (PF) 1 % IJ SOLN
INTRAMUSCULAR | Status: AC
  Filled 2024-02-04: qty 30

## 2024-02-04 MED ORDER — LACTATED RINGERS IV SOLN: INTRAVENOUS | Status: DC

## 2024-02-04 MED ORDER — DEXMEDETOMIDINE HCL IN NACL 80 MCG/20ML IV SOLN
INTRAVENOUS | Status: AC
  Filled 2024-02-04: qty 20

## 2024-02-04 SURGICAL SUPPLY — 46 items
BLADE SURG 15 STRL LF DISP TIS (BLADE) ×6 IMPLANT
BNDG COHESIVE 4X5 TAN STRL LF (GAUZE/BANDAGES/DRESSINGS) ×3 IMPLANT
BNDG ELASTIC 3INX 5YD STR LF (GAUZE/BANDAGES/DRESSINGS) IMPLANT
BNDG ELASTIC 4INX 5YD STR LF (GAUZE/BANDAGES/DRESSINGS) ×6 IMPLANT
BNDG ESMARK 4X9 LF (GAUZE/BANDAGES/DRESSINGS) ×3 IMPLANT
CHLORAPREP W/TINT 26 (MISCELLANEOUS) ×3 IMPLANT
CORD BIPOLAR FORCEPS 12FT (ELECTRODE) ×3 IMPLANT
COVER BACK TABLE 60X90IN (DRAPES) ×3 IMPLANT
CUFF TOURN SGL QUICK 18X4 (TOURNIQUET CUFF) IMPLANT
CUFF TRNQT CYL 24X4X16.5-23 (TOURNIQUET CUFF) ×3 IMPLANT
DRAPE HAND 75INX146IN 110IN (DRAPES) ×3 IMPLANT
DRAPE OEC MINIVIEW 54X84 (DRAPES) ×3 IMPLANT
DRAPE SURG 17X23 STRL (DRAPES) ×3 IMPLANT
GAUZE PAD ABD 8X10 STRL (GAUZE/BANDAGES/DRESSINGS) ×3 IMPLANT
GAUZE SPONGE 4X4 12PLY STRL (GAUZE/BANDAGES/DRESSINGS) ×3 IMPLANT
GAUZE STRETCH 2X75IN STRL (MISCELLANEOUS) ×3 IMPLANT
GAUZE XEROFORM 1X8 LF (GAUZE/BANDAGES/DRESSINGS) ×3 IMPLANT
GLOVE BIO SURGEON STRL SZ7.5 (GLOVE) ×6 IMPLANT
GLOVE BIOGEL PI IND STRL 7.5 (GLOVE) ×3 IMPLANT
GOWN STRL REUS W/ TWL LRG LVL3 (GOWN DISPOSABLE) ×3 IMPLANT
GOWN STRL REUS W/TWL XL LVL3 (GOWN DISPOSABLE) ×3 IMPLANT
GOWN STRL SURGICAL XL XLNG (GOWN DISPOSABLE) ×3 IMPLANT
MANIFOLD NEPTUNE II (INSTRUMENTS) ×3 IMPLANT
NDL HYPO 25X1 1.5 SAFETY (NEEDLE) IMPLANT
NDL HYPO 25X5/8 SAFETYGLIDE (NEEDLE) IMPLANT
NEEDLE HYPO 25X1 1.5 SAFETY (NEEDLE) IMPLANT
NEEDLE HYPO 25X5/8 SAFETYGLIDE (NEEDLE) ×2 IMPLANT
NS IRRIG 1000ML POUR BTL (IV SOLUTION) IMPLANT
PACK BASIN DAY SURGERY FS (CUSTOM PROCEDURE TRAY) ×3 IMPLANT
PAD CAST 4YDX4 CTTN HI CHSV (CAST SUPPLIES) ×6 IMPLANT
SHEET MEDIUM DRAPE 40X70 STRL (DRAPES) ×3 IMPLANT
SLEEVE SCD COMPRESS KNEE MED (STOCKING) IMPLANT
SPIKE FLUID TRANSFER (MISCELLANEOUS) IMPLANT
SPLINT PLASTER CAST XFAST 4X15 (CAST SUPPLIES) ×30 IMPLANT
STOCKINETTE IMPERVIOUS 9X36 MD (GAUZE/BANDAGES/DRESSINGS) ×3 IMPLANT
SUCTION TUBE FRAZIER 10FR DISP (SUCTIONS) IMPLANT
SUT CHROMIC 3 0 SH 27 (SUTURE) IMPLANT
SUT ETHILON 4 0 PS 2 18 (SUTURE) ×3 IMPLANT
SUT MNCRL AB 3-0 PS2 27 (SUTURE) ×3 IMPLANT
SUT MNCRL AB 4-0 PS2 18 (SUTURE) IMPLANT
SUT VIC AB 4-0 PS2 18 (SUTURE) IMPLANT
SYR BULB EAR ULCER 3OZ GRN STR (SYRINGE) ×6 IMPLANT
SYR CONTROL 10ML LL (SYRINGE) IMPLANT
TOWEL GREEN STERILE FF (TOWEL DISPOSABLE) ×6 IMPLANT
TUBE CONNECTING 20X1/4 (TUBING) IMPLANT
UNDERPAD 30X36 HEAVY ABSORB (UNDERPADS AND DIAPERS) ×3 IMPLANT

## 2024-02-04 NOTE — Anesthesia Preprocedure Evaluation (Signed)
 Anesthesia Evaluation  Patient identified by MRN, date of birth, ID band Patient awake    Reviewed: Allergy & Precautions, H&P , NPO status , Patient's Chart, lab work & pertinent test results  Airway Mallampati: II   Neck ROM: full    Dental   Pulmonary Current Smoker   breath sounds clear to auscultation       Cardiovascular negative cardio ROS  Rhythm:regular Rate:Normal     Neuro/Psych  PSYCHIATRIC DISORDERS Anxiety Depression       GI/Hepatic   Endo/Other    Renal/GU      Musculoskeletal   Abdominal   Peds  Hematology   Anesthesia Other Findings   Reproductive/Obstetrics                             Anesthesia Physical Anesthesia Plan  ASA: 2  Anesthesia Plan: MAC   Post-op Pain Management:    Induction: Intravenous  PONV Risk Score and Plan: 1 and Propofol infusion and Treatment may vary due to age or medical condition  Airway Management Planned: Simple Face Mask  Additional Equipment:   Intra-op Plan:   Post-operative Plan:   Informed Consent: I have reviewed the patients History and Physical, chart, labs and discussed the procedure including the risks, benefits and alternatives for the proposed anesthesia with the patient or authorized representative who has indicated his/her understanding and acceptance.     Dental advisory given  Plan Discussed with: CRNA, Anesthesiologist and Surgeon  Anesthesia Plan Comments:        Anesthesia Quick Evaluation

## 2024-02-04 NOTE — Anesthesia Procedure Notes (Signed)
 Procedure Name: MAC Date/Time: 02/04/2024 1:08 PM  Performed by: Jessica Priest, CRNAPre-anesthesia Checklist: Timeout performed, Patient being monitored, Suction available, Patient identified and Emergency Drugs available Patient Re-evaluated:Patient Re-evaluated prior to induction Oxygen Delivery Method: Simple face mask Preoxygenation: Pre-oxygenation with 100% oxygen Induction Type: IV induction Placement Confirmation: breath sounds checked- equal and bilateral, CO2 detector and positive ETCO2

## 2024-02-04 NOTE — Discharge Instructions (Addendum)
   Hand Surgery Postop Instructions   Dressings: Maintain postoperative dressing until orthopedic follow-up.  Keep operative site clean and dry until orthopedic follow-up.  Wound Care: Keep your hand elevated above the level of your heart.  Do not allow it to dangle by your side. Moving your fingers is advised to stimulate circulation but will depend on the site of your surgery.  If you have a splint applied, your doctor will advise you regarding movement.  Activity: Do not drive or operate machinery until clearance given from physician. No heavy lifting with operative extremity.  Diet:  Drink liquids today or eat a light diet.  You may resume a regular diet tomorrow.    General expectations: Take prescribed medication if given, transition to over-the-counter medication as quickly as possible. Fingers may become slightly swollen.  Call your doctor if any of the following occur: Severe pain not relieved by pain medication. Elevated temperature. Dressing soaked with blood. Inability to move fingers. White or bluish color to fingers.   Per [facility] clinic policy, our goal is ensure optimal postoperative pain control with a multimodal pain management strategy. For all [facility] patients, our goal is to wean post-operative narcotic medications by 6 weeks post-operatively. If this is not possible due to utilization of pain medication prior to surgery, your [facility] doctor will support your acute post-operative pain control for the first 6 weeks postoperatively, with a plan to transition you back to your primary pain team following that. Cyndia Skeeters will work to ensure a Therapist, occupational.  Anshul Trevor Mace, M.D. Hand Surgery [facility]   Post Anesthesia Home Care Instructions  Activity: Get plenty of rest for the remainder of the day. A responsible individual must stay with you for 24 hours following the procedure.  For the next 24 hours, DO NOT: -Drive a  car -Advertising copywriter -Drink alcoholic beverages -Take any medication unless instructed by your physician -Make any legal decisions or sign important papers.  Meals: Start with liquid foods such as gelatin or soup. Progress to regular foods as tolerated. Avoid greasy, spicy, heavy foods. If nausea and/or vomiting occur, drink only clear liquids until the nausea and/or vomiting subsides. Call your physician if vomiting continues.  Special Instructions/Symptoms: Your throat may feel dry or sore from the anesthesia or the breathing tube placed in your throat during surgery. If this causes discomfort, gargle with warm salt water. The discomfort should disappear within 24 hours.  If you had a scopolamine patch placed behind your ear for the management of post- operative nausea and/or vomiting:  1. The medication in the patch is effective for 72 hours, after which it should be removed.  Wrap patch in a tissue and discard in the trash. Wash hands thoroughly with soap and water. 2. You may remove the patch earlier than 72 hours if you experience unpleasant side effects which may include dry mouth, dizziness or visual disturbances. 3. Avoid touching the patch. Wash your hands with soap and water after contact with the patch.

## 2024-02-04 NOTE — Interval H&P Note (Signed)
 History and Physical Interval Note:  02/04/2024 11:44 AM  Gregory Murphy  has presented today for surgery, with the diagnosis of LEFT THUMB OPEN DISTAL PHALANX FRACTURE WITH POSSIBLE NAIL BED INJURY.  The various methods of treatment have been discussed with the patient and family. After consideration of risks, benefits and other options for treatment, the patient has consented to  Procedure(s): LEFT THUMB CLOSED REDUCTION FINGER WITH PERCUTANEOUS PINNING VS OPEN REDUCTION AND PINNING (Left) IRRIGATION AND DEBRIDEMENT OF LEFT THUMB (Left) POSSIBLE LEFT THUMB NAILBED REPAIR (Left) as a surgical intervention.  [name] history has been reviewed, patient examined, no change in status, stable for surgery.  I have reviewed the patient's chart and labs.  Questions were answered to the patient's satisfaction.     Myrth Dahan

## 2024-02-04 NOTE — Transfer of Care (Signed)
 Immediate Anesthesia Transfer of Care Note  Patient: Gregory Murphy  Procedure(s) Performed: Procedure(s) (LRB): LEFT THUMB CLOSED REDUCTION FINGER WITH PERCUTANEOUS PINNing (Left) IRRIGATION AND DEBRIDEMENT OF LEFT THUMB (Left) LEFT THUMB NAILBED REPAIR (Left)  Patient Location: PACU  Anesthesia Type: MAC  Level of Consciousness: awake, sedated, patient cooperative and responds to stimulation, c/o pain in back - comfort measures given w/ medication   Airway & Oxygen Therapy: Patient Spontanous Breathing and Patient connected to FM oxygen  Post-op Assessment: Report given to PACU RN, Post -op Vital signs reviewed and stable and Patient moving all extremities  Post vital signs: Reviewed and stable  Complications: No apparent anesthesia complications

## 2024-02-04 NOTE — Op Note (Signed)
 NAME: Gregory Murphy [facility] MEDICAL RECORD NO: [id number] DATE OF BIRTH: November 06, 1986 FACILITY: [facility] LOCATION: [facility] PHYSICIAN: Samuella Cota, MD   OPERATIVE REPORT   DATE OF PROCEDURE: 02/04/24    PREOPERATIVE DIAGNOSIS: Left thumb open distal phalanx fracture with possible nailbed injury   POSTOPERATIVE DIAGNOSIS: Left thumb open distal phalanx fracture with nailbed injury   PROCEDURE: Irrigation and debridement with reduction and pinning of distal phalanx fracture left thumb Left thumb nailbed repair   SURGEON:  Samuella Cota, M.D.   ASSISTANT: Glynn Octave, OPA   ANESTHESIA:  Regional with sedation   INTRAVENOUS FLUIDS:  Per anesthesia flow sheet.   ESTIMATED BLOOD LOSS:  Minimal.   COMPLICATIONS:  None.   SPECIMENS:  none   TOURNIQUET TIME:   Tourniquet was not utilized for this case   DISPOSITION:  Stable to PACU.   INDICATIONS: This is a 38 year old male who sustained a significant traumatic injury to the distal aspect of the left thumb with an open distal phalanx fracture and associated nailbed injury.  We discussed the open nature of this injury and the need for formal irrigation debridement stabilization.  I did explain to him the significant trauma to the nailbed which has been sustained which may preclude the ability for a new nail to regrow in this region.  Based upon his injury and workup, he is indicated for operative washout of the left thumb open distal phalanx fracture with open versus closed reduction and pinning, possible nailbed repair.   Risks and benefits of the procedure were discussed, risks including but not limited to infection, bleeding, scarring, stiffness, nerve injury, tendon injury, vascular injury, hardware complication, malunion, nonunion, nail deformity, lack of nail regrowth, recurrence of symptoms and need for subsequent operation.  Patient expressed understanding.   OPERATIVE COURSE: Patient was seen and  identified in the preoperative area and marked appropriately.  Surgical consent had been signed. Preoperative IV antibiotic prophylaxis was given. He was transferred to the operating room and placed in supine position with the left upper extremity on an arm board.  Sedation was induced by the anesthesiologist.  Left upper extremity was prepped and draped in normal sterile orthopedic fashion.  A surgical pause was performed between the surgeons, anesthesia, and operating room staff and all were in agreement as to the patient, procedure, and site of procedure.  Tourniquet was placed and padded appropriately to the left upper arm.  Tourniquet was not utilized for this case however.  First began with irrigation and debridement of the open distal phalanx fracture.  Copious irrigation was performed, there was noted to be comminution of the distal phalanx.  Gentle reduction maneuver was performed.  Then, in retrograde fashion, a 0.062 K wire was inserted into the distal phalanx, wire was inserted through the IP joint for additional stability into the proximal phalanx.  Biplanar fluoroscopy was utilized to confirm appropriate fracture reduction and wire placement.  After this, the attention was turned to the nailbed.  There was noted to be significant injury to the nailbed distally, the eponychial fold and germinal matrix were intact.  Nailbed repair was performed utilizing 5-0 Monocryl in simple fashion.  This was augmented with Dermabond.  The tourniquet was not utilized for this case.  Fingertips were pink with brisk capillary refill throughout.  Sterile dressings were applied utilizing Xeroform, 4 x 4's, Kerlix, plaster thumb spica splint and Ace wrap.  The operative drapes were broken down.  The patient was awoken from anesthesia safely and  taken to PACU in stable condition.   Post-operative plan: The patient will recover in the post-anesthesia care unit and then be discharged home.  The patient will be non  weight bearing on the left upper extremity in a thumb spica splint.   I will see the patient back in the office in 2 weeks for postoperative followup.  Discharge instructions were provided for appropriate dressing care as well as pain medication.  He had been given 7-day course of oral antibiotics by the emergency department, he can continue this course until completion.  Samuella Cota, MD Electronically signed, 02/04/24

## 2024-02-05 ENCOUNTER — Encounter (HOSPITAL_BASED_OUTPATIENT_CLINIC_OR_DEPARTMENT_OTHER): Payer: Self-pay | Admitting: Orthopedic Surgery

## 2024-02-07 NOTE — Anesthesia Postprocedure Evaluation (Signed)
 Anesthesia Post Note  Patient: Leta Speller Deanda  Procedure(s) Performed: LEFT THUMB CLOSED REDUCTION FINGER WITH PERCUTANEOUS PINNing (Left: Finger) IRRIGATION AND DEBRIDEMENT OF LEFT THUMB (Left) LEFT THUMB NAILBED REPAIR (Left)     Patient location during evaluation: PACU Anesthesia Type: MAC Level of consciousness: awake and alert Pain management: pain level controlled Vital Signs Assessment: post-procedure vital signs reviewed and stable Respiratory status: spontaneous breathing, nonlabored ventilation, respiratory function stable and patient connected to nasal cannula oxygen Cardiovascular status: stable and blood pressure returned to baseline Postop Assessment: no apparent nausea or vomiting Anesthetic complications: no   No notable events documented.  Last Vitals:  Vitals:   02/04/24 1424 02/04/24 1435  BP: 121/85 123/85  Pulse: 66 71  Resp: 12 16  Temp:  36.7 C  SpO2: 95% 94%    Last Pain:  Vitals:   02/04/24 1435  TempSrc:   PainSc: 0-No pain                 Ariyana Faw S

## 2024-02-18 ENCOUNTER — Ambulatory Visit (INDEPENDENT_AMBULATORY_CARE_PROVIDER_SITE_OTHER): Payer: Self-pay | Admitting: Orthopedic Surgery

## 2024-02-18 ENCOUNTER — Other Ambulatory Visit (INDEPENDENT_AMBULATORY_CARE_PROVIDER_SITE_OTHER): Payer: Self-pay

## 2024-02-18 DIAGNOSIS — S62522B Displaced fracture of distal phalanx of left thumb, initial encounter for open fracture: Secondary | ICD-10-CM

## 2024-02-18 NOTE — Progress Notes (Signed)
   Gregory Murphy - 38 y.o. male MRN [id number]  Date of birth: [birth year]  Office Visit Note: Visit Date: 02/18/2024 PCP: Patient, [name] Referred by: No ref. provider found  Subjective:  HPI: Gregory Murphy is a 38 y.o. male who presents today for follow up 2 weeks status post left thumb closed reduction finger with percutaneous pinning, irrigation and debridement of left thumb, left nailbed repair.  Pain is controlled, pin site remains clean dry and intact.  Pertinent ROS were reviewed with the patient and found to be negative unless otherwise specified above in HPI.   Assessment & Plan: Visit Diagnoses:  1. Open displaced fracture of distal phalanx of left thumb, initial encounter     Plan: X-rays were obtained today which show stable appearance of the distal phalanx fracture of the left thumb, pin remains well located.  Thumb spica brace was placed today, return in 2 weeks.  At that juncture, we can remove sutures from the nail plate protecting the underlying nailbed repair.  Follow-up: No follow-ups on file.   Meds & Orders: No orders of the defined types were placed in this encounter.   Orders Placed This Encounter  Procedures   XR Finger Thumb Left     Procedures: No procedures performed       Objective:   Vital Signs: There were no vitals taken for this visit.  Ortho Exam Left thumb: - Pin in place, well secured, clean dry and intact - Sensation intact distally to the thumb - Persistent swelling of the thumb, notable tenderness at the distal phalanx - Capillary refill is appropriate  Imaging: XR Finger Thumb Left Result Date: 02/18/2024 X-rays of the left thumb were obtained X-rays demonstrate stable appearance of the distal phalanx fracture with pin well located across the IP joint.  Fracture comminution is visualized.     Gregory Murphy, M.D. [facility], Hand Surgery

## 2024-02-27 ENCOUNTER — Other Ambulatory Visit (INDEPENDENT_AMBULATORY_CARE_PROVIDER_SITE_OTHER): Payer: Self-pay

## 2024-02-27 ENCOUNTER — Ambulatory Visit (INDEPENDENT_AMBULATORY_CARE_PROVIDER_SITE_OTHER): Payer: Self-pay | Admitting: Orthopedic Surgery

## 2024-02-27 DIAGNOSIS — S62522B Displaced fracture of distal phalanx of left thumb, initial encounter for open fracture: Secondary | ICD-10-CM

## 2024-02-27 DIAGNOSIS — S6992XA Unspecified injury of left wrist, hand and finger(s), initial encounter: Secondary | ICD-10-CM

## 2024-02-27 NOTE — Progress Notes (Signed)
   Gregory Murphy - 38 y.o. male MRN [id number]  Date of birth: September 19, 1986  Office Visit Note: Visit Date: 02/27/2024 PCP: Patient, [name] Referred by: No ref. provider found  Subjective:  HPI: Gregory Murphy is a 38 y.o. male who presents today for follow up 3 weeks status post left thumb closed reduction finger with percutaneous pinning, irrigation and debridement, left thumb nailbed repair.  Pain controlled, no significant complaints.  Pertinent ROS were reviewed with the patient and found to be negative unless otherwise specified above in HPI.   Assessment & Plan: Visit Diagnoses:  1. Open displaced fracture of distal phalanx of left thumb, initial encounter   2. Injury of nail bed of left thumb, initial encounter     Plan: Pin was removed today without incident.  X-rays demonstrate appropriate interval healing.  Clinically, he is demonstrating appropriate healing as well.  We did once again discussed the significant nailbed injury and the possibility for ongoing nail deformity or lack of nail regrowth, he expressed full understanding.  Dressing was placed today over the pin site.  Given his low platelet count, I did explain that there could be an element of some bleeding which will require some dressing changes.  Thumb mallet splint was applied as well to be utilized for the next 3 weeks over the distal phalanx, encouraged to perform range of motion of the IP joint.  Return at that time for repeat clinical and radiographic check.  Follow-up: Return in about 1 month (around 03/29/2024).   Meds & Orders: No orders of the defined types were placed in this encounter.   Orders Placed This Encounter  Procedures   XR Finger Thumb Left     Procedures: No procedures performed       Objective:   Vital Signs: There were no vitals taken for this visit.  Ortho Exam Left thumb: - Pin removed, site clean dry and intact, no erythema or drainage, mild bleeding from the pin  site after removal - Able to form range of motion of the IP joint, 0-45 - Sensation is intact distally, color is appropriate - Proximal portion of the nail plate is secured beneath the eponychial fold, exposed distal nailbed  Imaging: XR Finger Thumb Left Result Date: 02/27/2024 X-rays of the left thumb demonstrate distal phalanx fracture with appropriate placement of the pin across the fracture site, and extend across the IP joint with appropriate positioning within the proximal phalanx.    Dandy Lazaro Trevor Mace, M.D. [facility], Hand Surgery

## 2024-03-01 ENCOUNTER — Emergency Department (HOSPITAL_COMMUNITY): Payer: MEDICAID

## 2024-03-01 ENCOUNTER — Inpatient Hospital Stay (HOSPITAL_COMMUNITY)
Admission: EM | Admit: 2024-03-01 | Discharge: 2024-03-05 | DRG: 433 | Discharge status: Against Medical Advice | Payer: MEDICAID | Attending: Internal Medicine | Admitting: Internal Medicine

## 2024-03-01 ENCOUNTER — Encounter (HOSPITAL_COMMUNITY): Payer: Self-pay | Admitting: Emergency Medicine

## 2024-03-01 DIAGNOSIS — F10930 Alcohol use, unspecified with withdrawal, uncomplicated: Principal | ICD-10-CM

## 2024-03-01 DIAGNOSIS — F17210 Nicotine dependence, cigarettes, uncomplicated: Secondary | ICD-10-CM | POA: Diagnosis present

## 2024-03-01 DIAGNOSIS — K760 Fatty (change of) liver, not elsewhere classified: Secondary | ICD-10-CM | POA: Diagnosis present

## 2024-03-01 DIAGNOSIS — E878 Other disorders of electrolyte and fluid balance, not elsewhere classified: Secondary | ICD-10-CM | POA: Diagnosis present

## 2024-03-01 DIAGNOSIS — E46 Unspecified protein-calorie malnutrition: Secondary | ICD-10-CM | POA: Diagnosis present

## 2024-03-01 DIAGNOSIS — D696 Thrombocytopenia, unspecified: Secondary | ICD-10-CM | POA: Insufficient documentation

## 2024-03-01 DIAGNOSIS — F10939 Alcohol use, unspecified with withdrawal, unspecified: Secondary | ICD-10-CM | POA: Diagnosis present

## 2024-03-01 DIAGNOSIS — F10230 Alcohol dependence with withdrawal, uncomplicated: Secondary | ICD-10-CM | POA: Diagnosis present

## 2024-03-01 DIAGNOSIS — Y905 Blood alcohol level of 100-119 mg/100 ml: Secondary | ICD-10-CM | POA: Diagnosis present

## 2024-03-01 DIAGNOSIS — K7011 Alcoholic hepatitis with ascites: Secondary | ICD-10-CM | POA: Diagnosis present

## 2024-03-01 DIAGNOSIS — E871 Hypo-osmolality and hyponatremia: Secondary | ICD-10-CM | POA: Diagnosis present

## 2024-03-01 DIAGNOSIS — R188 Other ascites: Secondary | ICD-10-CM

## 2024-03-01 DIAGNOSIS — Z5329 Procedure and treatment not carried out because of patient's decision for other reasons: Secondary | ICD-10-CM | POA: Diagnosis present

## 2024-03-01 DIAGNOSIS — K2920 Alcoholic gastritis without bleeding: Secondary | ICD-10-CM | POA: Diagnosis present

## 2024-03-01 DIAGNOSIS — Z720 Tobacco use: Secondary | ICD-10-CM | POA: Insufficient documentation

## 2024-03-01 DIAGNOSIS — E876 Hypokalemia: Secondary | ICD-10-CM | POA: Diagnosis present

## 2024-03-01 DIAGNOSIS — Z560 Unemployment, unspecified: Secondary | ICD-10-CM | POA: Diagnosis not present

## 2024-03-01 DIAGNOSIS — D6959 Other secondary thrombocytopenia: Secondary | ICD-10-CM | POA: Diagnosis present

## 2024-03-01 DIAGNOSIS — K709 Alcoholic liver disease, unspecified: Secondary | ICD-10-CM

## 2024-03-01 DIAGNOSIS — R772 Abnormality of alphafetoprotein: Secondary | ICD-10-CM

## 2024-03-01 DIAGNOSIS — R768 Other specified abnormal immunological findings in serum: Secondary | ICD-10-CM

## 2024-03-01 DIAGNOSIS — K7031 Alcoholic cirrhosis of liver with ascites: Secondary | ICD-10-CM | POA: Diagnosis present

## 2024-03-01 DIAGNOSIS — Z87820 Personal history of traumatic brain injury: Secondary | ICD-10-CM

## 2024-03-01 DIAGNOSIS — D539 Nutritional anemia, unspecified: Secondary | ICD-10-CM | POA: Diagnosis present

## 2024-03-01 DIAGNOSIS — K8020 Calculus of gallbladder without cholecystitis without obstruction: Secondary | ICD-10-CM | POA: Diagnosis present

## 2024-03-01 DIAGNOSIS — E872 Acidosis, unspecified: Secondary | ICD-10-CM

## 2024-03-01 DIAGNOSIS — Z6820 Body mass index (BMI) 20.0-20.9, adult: Secondary | ICD-10-CM

## 2024-03-01 DIAGNOSIS — K7040 Alcoholic hepatic failure without coma: Secondary | ICD-10-CM | POA: Diagnosis present

## 2024-03-01 DIAGNOSIS — E8721 Acute metabolic acidosis: Secondary | ICD-10-CM | POA: Diagnosis present

## 2024-03-01 HISTORY — DX: Alcohol use, unspecified with withdrawal, unspecified: F10.939

## 2024-03-01 LAB — BASIC METABOLIC PANEL
Anion gap: 17 — ABNORMAL HIGH (ref 5–15)
BUN: 5 mg/dL — ABNORMAL LOW (ref 6–20)
CO2: 26 mmol/L (ref 22–32)
Calcium: 8.4 mg/dL — ABNORMAL LOW (ref 8.9–10.3)
Chloride: 94 mmol/L — ABNORMAL LOW (ref 98–111)
Creatinine, Ser: 0.79 mg/dL (ref 0.61–1.24)
GFR, Estimated: >60 mL/min (ref >60)
Glucose, Bld: 85 mg/dL (ref 70–99)
Potassium: 4.1 mmol/L (ref 3.5–5.1)
Sodium: 137 mmol/L (ref 135–145)

## 2024-03-01 LAB — URINALYSIS, ROUTINE W REFLEX MICROSCOPIC
Bacteria, UA: NONE SEEN
Glucose, UA: NEGATIVE mg/dL
Hgb urine dipstick: NEGATIVE
Ketones, ur: NEGATIVE mg/dL
Leukocytes,Ua: NEGATIVE
Nitrite: POSITIVE — AB
Protein, ur: 30 mg/dL — AB
Specific Gravity, Urine: 1.03 (ref 1.005–1.030)
pH: 5 (ref 5.0–8.0)

## 2024-03-01 LAB — COMPREHENSIVE METABOLIC PANEL
ALT: 48 U/L — ABNORMAL HIGH (ref 0–44)
AST: 176 U/L — ABNORMAL HIGH (ref 15–41)
Albumin: 2.8 g/dL — ABNORMAL LOW (ref 3.5–5.0)
Alkaline Phosphatase: 76 U/L (ref 38–126)
Anion gap: 17 — ABNORMAL HIGH (ref 5–15)
BUN: 6 mg/dL (ref 6–20)
CO2: 26 mmol/L (ref 22–32)
Calcium: 8.3 mg/dL — ABNORMAL LOW (ref 8.9–10.3)
Chloride: 91 mmol/L — ABNORMAL LOW (ref 98–111)
Creatinine, Ser: 0.94 mg/dL (ref 0.61–1.24)
GFR, Estimated: >60 mL/min (ref >60)
Glucose, Bld: 116 mg/dL — ABNORMAL HIGH (ref 70–99)
Potassium: 2.8 mmol/L — ABNORMAL LOW (ref 3.5–5.1)
Sodium: 134 mmol/L — ABNORMAL LOW (ref 135–145)
Total Bilirubin: 7 mg/dL — ABNORMAL HIGH (ref 0.0–1.2)
Total Protein: 6.4 g/dL — ABNORMAL LOW (ref 6.5–8.1)

## 2024-03-01 LAB — CBC WITH DIFFERENTIAL/PLATELET
Abs Immature Granulocytes: 0.01 10*3/uL (ref 0.00–0.07)
Basophils Absolute: 0 10*3/uL (ref 0.0–0.1)
Basophils Relative: 1 %
Eosinophils Absolute: 0 10*3/uL (ref 0.0–0.5)
Eosinophils Relative: 1 %
HCT: 28.8 % — ABNORMAL LOW (ref 39.0–52.0)
Hemoglobin: 10.3 g/dL — ABNORMAL LOW (ref 13.0–17.0)
Immature Granulocytes: 0 %
Lymphocytes Relative: 25 %
Lymphs Abs: 1 10*3/uL (ref 0.7–4.0)
MCH: 38.6 pg — ABNORMAL HIGH (ref 26.0–34.0)
MCHC: 35.8 g/dL (ref 30.0–36.0)
MCV: 107.9 fL — ABNORMAL HIGH (ref 80.0–100.0)
Monocytes Absolute: 0.4 10*3/uL (ref 0.1–1.0)
Monocytes Relative: 9 %
Neutro Abs: 2.6 10*3/uL (ref 1.7–7.7)
Neutrophils Relative %: 64 %
Platelets: 20 10*3/uL — CL (ref 150–400)
RBC: 2.67 MIL/uL — ABNORMAL LOW (ref 4.22–5.81)
RDW: 14.4 % (ref 11.5–15.5)
Smear Review: DECREASED
WBC: 4 10*3/uL (ref 4.0–10.5)
nRBC: 0 % (ref 0.0–0.2)

## 2024-03-01 LAB — AMMONIA: Ammonia: 51 umol/L — ABNORMAL HIGH (ref 9–35)

## 2024-03-01 LAB — ETHANOL: Alcohol, Ethyl (B): 117 mg/dL — ABNORMAL HIGH (ref <10)

## 2024-03-01 LAB — LIPASE, BLOOD: Lipase: 72 U/L — ABNORMAL HIGH (ref 11–51)

## 2024-03-01 LAB — MAGNESIUM
Magnesium: 1.6 mg/dL — ABNORMAL LOW (ref 1.7–2.4)
Magnesium: 1.4 mg/dL — ABNORMAL LOW (ref 1.7–2.4)

## 2024-03-01 LAB — PROTIME-INR
INR: 1.9 — ABNORMAL HIGH (ref 0.8–1.2)
Prothrombin Time: 21.7 s — ABNORMAL HIGH (ref 11.4–15.2)

## 2024-03-01 LAB — LACTIC ACID, PLASMA: Lactic Acid, Venous: 7.8 mmol/L (ref 0.5–1.9)

## 2024-03-01 MED ORDER — LORAZEPAM 2 MG/ML IJ SOLN
2.0000 mg | Freq: Once | INTRAMUSCULAR | Status: AC
  Administered 2024-03-01: 2 mg via INTRAVENOUS
  Filled 2024-03-01: qty 1

## 2024-03-01 MED ORDER — ONDANSETRON HCL 4 MG PO TABS
4.0000 mg | ORAL_TABLET | Freq: Four times a day (QID) | ORAL | Status: DC | PRN
Start: 2024-03-01 — End: 2024-03-05

## 2024-03-01 MED ORDER — TRAMADOL HCL 50 MG PO TABS
50.0000 mg | ORAL_TABLET | Freq: Three times a day (TID) | ORAL | Status: DC | PRN
  Administered 2024-03-03: 50 mg via ORAL
  Filled 2024-03-01: qty 1

## 2024-03-01 MED ORDER — MAGNESIUM SULFATE 4 GM/100ML IV SOLN
4.0000 g | Freq: Once | INTRAVENOUS | Status: AC
  Administered 2024-03-01: 4 g via INTRAVENOUS
  Filled 2024-03-01: qty 100

## 2024-03-01 MED ORDER — LORAZEPAM 2 MG/ML IJ SOLN: 1.0000 mg | INTRAMUSCULAR | Status: AC | PRN

## 2024-03-01 MED ORDER — LACTATED RINGERS IV BOLUS
1000.0000 mL | Freq: Once | INTRAVENOUS | Status: AC
  Administered 2024-03-01: 1000 mL via INTRAVENOUS

## 2024-03-01 MED ORDER — SODIUM CHLORIDE 0.9 % IV BOLUS
500.0000 mL | Freq: Once | INTRAVENOUS | Status: AC
  Administered 2024-03-01: 500 mL via INTRAVENOUS

## 2024-03-01 MED ORDER — THIAMINE HCL 100 MG/ML IJ SOLN
100.0000 mg | Freq: Every day | INTRAMUSCULAR | Status: DC
  Administered 2024-03-01: 100 mg via INTRAVENOUS
  Filled 2024-03-01: qty 2

## 2024-03-01 MED ORDER — THIAMINE MONONITRATE 100 MG PO TABS
100.0000 mg | ORAL_TABLET | Freq: Every day | ORAL | Status: DC
  Filled 2024-03-01: qty 1

## 2024-03-01 MED ORDER — THIAMINE HCL 100 MG/ML IJ SOLN
100.0000 mg | Freq: Every day | INTRAMUSCULAR | Status: DC
Start: 2024-03-02 — End: 2024-03-04
  Administered 2024-03-03: 100 mg via INTRAVENOUS
  Filled 2024-03-01: qty 2

## 2024-03-01 MED ORDER — IOHEXOL 350 MG/ML SOLN
75.0000 mL | Freq: Once | INTRAVENOUS | Status: AC | PRN
  Administered 2024-03-01: 75 mL via INTRAVENOUS

## 2024-03-01 MED ORDER — POTASSIUM CHLORIDE 10 MEQ/100ML IV SOLN
10.0000 meq | Freq: Once | INTRAVENOUS | Status: AC
  Administered 2024-03-01: 10 meq via INTRAVENOUS
  Filled 2024-03-01: qty 100

## 2024-03-01 MED ORDER — ALBUTEROL SULFATE (2.5 MG/3ML) 0.083% IN NEBU: 2.5000 mg | INHALATION_SOLUTION | RESPIRATORY_TRACT | Status: DC | PRN

## 2024-03-01 MED ORDER — THIAMINE HCL 100 MG/ML IJ SOLN: 100.0000 mg | Freq: Every day | INTRAMUSCULAR | Status: DC

## 2024-03-01 MED ORDER — POTASSIUM CHLORIDE CRYS ER 20 MEQ PO TBCR
40.0000 meq | EXTENDED_RELEASE_TABLET | Freq: Once | ORAL | Status: AC
  Administered 2024-03-01: 40 meq via ORAL
  Filled 2024-03-01: qty 2

## 2024-03-01 MED ORDER — LORAZEPAM 1 MG PO TABS
1.0000 mg | ORAL_TABLET | ORAL | Status: AC | PRN
  Administered 2024-03-02: 1 mg via ORAL
  Administered 2024-03-02: 2 mg via ORAL
  Administered 2024-03-03: 1 mg via ORAL
  Filled 2024-03-01: qty 1
  Filled 2024-03-01: qty 2
  Filled 2024-03-01: qty 1

## 2024-03-01 MED ORDER — FOLIC ACID 1 MG PO TABS
1.0000 mg | ORAL_TABLET | Freq: Every day | ORAL | Status: DC
  Administered 2024-03-01 – 2024-03-05 (×5): 1 mg via ORAL
  Filled 2024-03-01 (×5): qty 1

## 2024-03-01 MED ORDER — DOCUSATE SODIUM 100 MG PO CAPS
100.0000 mg | ORAL_CAPSULE | Freq: Two times a day (BID) | ORAL | Status: DC
  Administered 2024-03-01 – 2024-03-02 (×3): 100 mg via ORAL
  Filled 2024-03-01 (×7): qty 1

## 2024-03-01 MED ORDER — THIAMINE MONONITRATE 100 MG PO TABS
100.0000 mg | ORAL_TABLET | Freq: Every day | ORAL | Status: DC
Start: 2024-03-02 — End: 2024-03-05
  Administered 2024-03-02 – 2024-03-04 (×2): 100 mg via ORAL
  Filled 2024-03-01: qty 1

## 2024-03-01 MED ORDER — LACTULOSE 10 GM/15ML PO SOLN
10.0000 g | Freq: Two times a day (BID) | ORAL | Status: DC
  Administered 2024-03-01 – 2024-03-05 (×7): 10 g via ORAL
  Filled 2024-03-01: qty 30
  Filled 2024-03-01: qty 15
  Filled 2024-03-01 (×5): qty 30

## 2024-03-01 MED ORDER — ADULT MULTIVITAMIN W/MINERALS CH
1.0000 | ORAL_TABLET | Freq: Every day | ORAL | Status: DC
  Administered 2024-03-01 – 2024-03-05 (×5): 1 via ORAL
  Filled 2024-03-01 (×5): qty 1

## 2024-03-01 MED ORDER — ONDANSETRON HCL 4 MG/2ML IJ SOLN: 4.0000 mg | Freq: Four times a day (QID) | INTRAMUSCULAR | Status: DC | PRN

## 2024-03-01 NOTE — ED Notes (Signed)
 clear PO fluids given to patient.

## 2024-03-01 NOTE — ED Provider Notes (Signed)
 [place] EMERGENCY DEPARTMENT AT [facility] Provider Note   CSN: [id number] Arrival date & time: 03/01/24  1427     History  Chief Complaint  Patient presents with   Abdominal Pain    Gregory Murphy is a 38 y.o. male.  HPI 38 year old male with a history of cirrhosis and chronic alcohol abuse presents with abdominal pain and vomiting.  He states he has been vomiting for about 4 or 5 days.  He has been progressively cutting back alcohol, currently drinks about 5-6 beers per day but has not drink since yesterday.  No hematemesis.  He is having normal bowel movements.  He feels like his abdomen is distended and diffusely sore.  Home Medications Prior to Admission medications   Medication Sig Start Date End Date Taking? Authorizing Provider  cephALEXin (KEFLEX) 500 MG capsule Take 1 capsule (500 mg total) by mouth 3 (three) times daily. 01/31/24   Al Decant, PA-C  HYDROcodone-acetaminophen (NORCO/VICODIN) 5-325 MG tablet Take 1 tablet by mouth every 6 (six) hours as needed. 02/04/24   Agarwala, Anshul, MD  vitamin B-12 (CYANOCOBALAMIN) 500 MCG tablet Take 500 mcg by mouth daily.    [provider]      Allergies    Patient has no known allergies.    Review of Systems   Review of Systems  Constitutional:  Negative for fever.  Gastrointestinal:  Positive for abdominal distention, abdominal pain, nausea and vomiting. Negative for blood in stool and diarrhea.    Physical Exam Updated Vital Signs BP 126/87   Pulse (!) 119   Temp 98.1 F (36.7 C) (Oral)   Resp (!) 24   SpO2 100%  Physical Exam Vitals and nursing note reviewed.  Constitutional:      Appearance: He is well-developed.  HENT:     Head: Normocephalic and atraumatic.  Cardiovascular:     Rate and Rhythm: Regular rhythm. Tachycardia present.     Heart sounds: Normal heart sounds.  Pulmonary:     Effort: Pulmonary effort is normal.     Breath sounds: Normal breath sounds.   Abdominal:     Palpations: Abdomen is soft.     Tenderness: There is generalized abdominal tenderness (mild).  Skin:    General: Skin is warm and dry.  Neurological:     Mental Status: He is alert.     Comments: Bilateral upper extremity tremors.     ED Results / Procedures / Treatments   Labs (all labs ordered are listed, but only abnormal results are displayed) Labs Reviewed  COMPREHENSIVE METABOLIC PANEL - Abnormal; Notable for the following components:      Result Value   Sodium 134 (*)    Potassium 2.8 (*)    Chloride 91 (*)    Glucose, Bld 116 (*)    Calcium 8.3 (*)    Total Protein 6.4 (*)    Albumin 2.8 (*)    AST 176 (*)    ALT 48 (*)    Total Bilirubin 7.0 (*)    Anion gap 17 (*)    All other components within normal limits  LIPASE, BLOOD - Abnormal; Notable for the following components:   Lipase 72 (*)    All other components within normal limits  CBC WITH DIFFERENTIAL/PLATELET - Abnormal; Notable for the following components:   RBC 2.67 (*)    Hemoglobin 10.3 (*)    HCT 28.8 (*)    MCV 107.9 (*)    MCH 38.6 (*)  Platelets 20 (*)    All other components within normal limits  URINALYSIS, ROUTINE W REFLEX MICROSCOPIC - Abnormal; Notable for the following components:   Color, Urine AMBER (*)    Bilirubin Urine MODERATE (*)    Protein, ur 30 (*)    Nitrite POSITIVE (*)    All other components within normal limits  ETHANOL - Abnormal; Notable for the following components:   Alcohol, Ethyl (B) 117 (*)    All other components within normal limits  MAGNESIUM - Abnormal; Notable for the following components:   Magnesium 1.6 (*)    All other components within normal limits  PROTIME-INR - Abnormal; Notable for the following components:   Prothrombin Time 21.7 (*)    INR 1.9 (*)    All other components within normal limits  AMMONIA - Abnormal; Notable for the following components:   Ammonia 51 (*)    All other components within normal limits    EKG EKG  Interpretation Date/Time:  Sunday March 01 2024 19:42:10 EDT Ventricular Rate:  98 PR Interval:  146 QRS Duration:  99 QT Interval:  367 QTC Calculation: 469 R Axis:   64  Text Interpretation: Sinus rhythm Borderline T abnormalities, anterior leads Confirmed by Pricilla Loveless [phone]) on 03/01/2024 8:04:12 PM  Radiology CT ABDOMEN PELVIS W CONTRAST Result Date: 03/01/2024 CLINICAL DATA:  Abdominal pain EXAM: CT ABDOMEN AND PELVIS WITH CONTRAST TECHNIQUE: Multidetector CT imaging of the abdomen and pelvis was performed using the standard protocol following bolus administration of intravenous contrast. RADIATION DOSE REDUCTION: This exam was performed according to the departmental dose-optimization program which includes automated exposure control, adjustment of the mA and/or kV according to patient size and/or use of iterative reconstruction technique. CONTRAST:  75mL OMNIPAQUE IOHEXOL 350 MG/ML SOLN COMPARISON:  09/09/2023 FINDINGS: Lower chest: No acute findings. Hepatobiliary: Severe diffuse low-density throughout the liver compatible with fatty infiltration. Nodular contours of the liver compatible with cirrhosis. Multiple layering gallstones within the gallbladder. No biliary ductal dilatation. Pancreas: No focal abnormality or ductal dilatation. Spleen: No focal abnormality.  Normal size. Adrenals/Urinary Tract: No adrenal abnormality. No focal renal abnormality. No stones or hydronephrosis. Urinary bladder is unremarkable. Stomach/Bowel: Wall thickening within the right colon felt to reflect portal colopathy. Stomach and small bowel decompressed. No bowel obstruction. Vascular/Lymphatic: Aortic atherosclerosis. No evidence of aneurysm or adenopathy. Distal esophageal varices noted compatible with portal venous hypertension. Reproductive: No visible focal abnormality. Other: Moderate to large volume ascites in the abdomen or pelvis. Musculoskeletal: No acute bony abnormality. IMPRESSION: Severe  hepatic steatosis with cirrhosis. Associated distal esophageal varices and moderate to large volume ascites. Cholelithiasis.  No CT evidence of acute cholecystitis. Electronically Signed   By: Charlett Nose M.D.   On: 03/01/2024 19:10    Procedures .Critical Care  Performed by: Pricilla Loveless, MD Authorized by: Pricilla Loveless, MD   Critical care provider statement:    Critical care time (minutes):  30   Critical care time was exclusive of:  Separately billable procedures and treating other patients   Critical care was necessary to treat or prevent imminent or life-threatening deterioration of the following conditions:  CNS failure or compromise and metabolic crisis   Critical care was time spent personally by me on the following activities:  Development of treatment plan with patient or surrogate, discussions with consultants, evaluation of patient's response to treatment, examination of patient, ordering and review of laboratory studies, ordering and review of radiographic studies, ordering and performing treatments and interventions, pulse oximetry,  re-evaluation of patient's condition and review of old charts     Medications Ordered in ED Medications  thiamine (VITAMIN B1) injection 100 mg (100 mg Intravenous Given 03/01/24 1943)  LORazepam (ATIVAN) injection 2 mg (2 mg Intravenous Given 03/01/24 1739)  potassium chloride SA (KLOR-CON M) CR tablet 40 mEq (40 mEq Oral Given 03/01/24 1741)  lactated ringers bolus 1,000 mL (0 mLs Intravenous Stopped 03/01/24 2020)  potassium chloride 10 mEq in 100 mL IVPB (0 mEq Intravenous Stopped 03/01/24 1901)  iohexol (OMNIPAQUE) 350 MG/ML injection 75 mL (75 mLs Intravenous Contrast Given 03/01/24 1853)  LORazepam (ATIVAN) injection 2 mg (2 mg Intravenous Given 03/01/24 2019)    ED Course/ Medical Decision Making/ A&P                                 Medical Decision Making Amount and/or Complexity of Data Reviewed Labs: ordered.    Details:  Hypokalemia and hypomagnesemia. Radiology: ordered.    Details: Ascites ECG/medicine tests: ordered and independent interpretation performed.    Details: Nonspecific changes  Risk Prescription drug management. Decision regarding hospitalization.   Patient presents with abdominal pain.  Seems to have worsening liver disease from alcohol.  He was given IV Ativan as he appears to have acute alcohol withdrawal without delirium.  He is also hypokalemic and hypomagnesemia, presumably from both alcohol as well as vomiting.  Was given IV and oral potassium replacement.  He was given IV Ativan multiple times to help with his tremors and withdrawal symptoms.  He will need admission and to have his withdrawal symptoms monitored.  He does have some ascites though I put a bedside ultrasound probe on and there is no clear pocket that I would feel comfortable tapping, especially in the setting of I do not think he has SBP but rather just generic abdominal distention from ascites.  Discussed with Dr. Marina Goodell of GI who will consult.  Stable for medical admission, discussed with Dr. Maisie Fus.        Final Clinical Impression(s) / ED Diagnoses Final diagnoses:  Alcohol withdrawal syndrome without complication (HCC)  Alcoholic hepatitis with ascites    Rx / DC Orders ED Discharge Orders     None         Pricilla Loveless, MD 03/01/24 2117

## 2024-03-01 NOTE — H&P (Signed)
 History and Physical    Gregory Murphy PPI:[id number] DOB: 07/31/1986 DOA: 03/01/2024  PCP: Patient, [name]  Patient coming from: home  I have personally briefly reviewed patient's old medical records in [facility]  Chief Complaint: abdominal pain n/v                                ETOH abuse   HPI: Bronislaw Switzer is a 38 y.o. male with medical history significant of ETOH abuse, TBI, depression , panic attack who presents to ED with complaint of abdominal pain, n/v.Patient noted last drink was yesterday. Patient currently noted  abdominal pain and nausea improved. He noted discomfort but this is due to distention  of his abdomen.    ED Course:  Afeb, bp 139/94, hr 139, rr 17 sat 97%  Na 134, K 2.8, Cl 91, bicarb 26, glu 116, cr 0.94 AST 176, ALT 48 T BILI 7 (2.8) AG17 Lipase 72 ETOH 117 Wbc wbc 4, hgb 10.3, MCV 107.9, plt 20 UA: +nitrite, + protein INR 1.9  Ammonia 51 Mag 1.6  CT ab IMPRESSION: Severe hepatic steatosis with cirrhosis. Associated distal esophageal varices and moderate to large volume ascites.   Cholelithiasis.  No CT evidence of acute cholecystitis. Tx Lorazepam 2mg , LR 1L, KCL 10 meq `1`1``  Review of Systems: As per HPI otherwise 10 point review of systems negative.   Past Medical History:  Diagnosis Date   Brain injury (HCC)    Depression    Insomnia    Panic attack     Past Surgical History:  Procedure Laterality Date   ABDOMINAL SURGERY     CLOSED REDUCTION FINGER WITH PERCUTANEOUS PINNING Left 02/04/2024   Procedure: LEFT THUMB CLOSED REDUCTION FINGER WITH PERCUTANEOUS PINNing;  Surgeon: Samuella Cota, MD;  Location: [facility];  Service: Orthopedics;  Laterality: Left;   DISTAL FEMUR BONE BRIDGE EXCISION Left    FEMUR CLOSED REDUCTION Left 12/17/2021   Procedure: CLOSED REDUCTION FEMORAL SHAFT;  Surgeon: [name], MD;  Location: [facility];  Service: Orthopedics;  Laterality: Left;   FOREARM  SURGERY Left    x4-metal rods placed   I & D EXTREMITY Left 02/04/2024   Procedure: IRRIGATION AND DEBRIDEMENT OF LEFT THUMB;  Surgeon: Samuella Cota, MD;  Location: [facility];  Service: Orthopedics;  Laterality: Left;   LEG SURGERY Right 2023   NAILBED REPAIR Left 02/04/2024   Procedure: LEFT THUMB NAILBED REPAIR;  Surgeon: Samuella Cota, MD;  Location: [facility];  Service: Orthopedics;  Laterality: Left;   ORIF FEMUR FRACTURE Left 12/20/2021   Procedure: OPEN REDUCTION INTERNAL FIXATION (ORIF) DISTAL FEMUR FRACTURE;  Surgeon: Roby Lofts, MD;  Location: [facility];  Service: Orthopedics;  Laterality: Left;   TIBIA IM NAIL INSERTION Bilateral 12/17/2021   Procedure: RIGHT INTRAMEDULLARY (IM) NAIL TIBIAL; RIGHT CLOSED TREATMENT OF FIBULA FRACTURE; LEFT CLOSED REDUCTION SPLINTING OF PERIPROSTHETIC  SUPRACONDYLAR FEMUR FRACTURE;  Surgeon: [name], MD;  Location: [facility];  Service: Orthopedics;  Laterality: Bilateral;   WRIST ARTHROSCOPY WITH CARPOMETACARPEL [facility]) ARTHROPLASTY Left 03/21/2022   Procedure: left wrist arthroscopy with debridement as needed;  Surgeon: Dairl Ponder, MD;  Location: [facility];  Service: Orthopedics;  Laterality: Left;  just needs 60 mins for surgery   WRIST SURGERY Right    pins     reports that he has been smoking cigarettes. He has a  7 pack-year smoking history. He has never used smokeless tobacco. He reports that he does not currently use alcohol after a past usage of about 28.0 standard drinks of alcohol per week. He reports that he does not currently use drugs.  No Known Allergies  History reviewed. No pertinent family history.  Prior to Admission medications   Medication Sig Start Date End Date Taking? Authorizing Provider  cephALEXin (KEFLEX) 500 MG capsule Take 1 capsule (500 mg total) by mouth 3 (three) times daily. 01/31/24   Al Decant, PA-C  HYDROcodone-acetaminophen (NORCO/VICODIN)  5-325 MG tablet Take 1 tablet by mouth every 6 (six) hours as needed. 02/04/24   Agarwala, Anshul, MD  vitamin B-12 (CYANOCOBALAMIN) 500 MCG tablet Take 500 mcg by mouth daily.    [provider]    Physical Exam: Vitals:   03/01/24 1745 03/01/24 1850 03/01/24 1855 03/01/24 1946  BP: (!) 132/110  121/81 126/87  Pulse: (!) 108  100 (!) 119  Resp: 15  (!) 24   Temp:  98.1 F (36.7 C)    TempSrc:  Oral    SpO2: 97%  100%     Constitutional: NAD, calm, comfortable Vitals:   03/01/24 1745 03/01/24 1850 03/01/24 1855 03/01/24 1946  BP: (!) 132/110  121/81 126/87  Pulse: (!) 108  100 (!) 119  Resp: 15  (!) 24   Temp:  98.1 F (36.7 C)    TempSrc:  Oral    SpO2: 97%  100%    Eyes: PERRL, lids and icteric sclera ENMT: Mucous membranes are moist. Posterior pharynx clear of any exudate or lesions.Normal dentition.  Neck: normal, supple, no masses, no thyromegaly Respiratory: clear to auscultation bilaterally, no wheezing, no crackles. Normal respiratory effort. No accessory muscle use.  Cardiovascular: Regular rate and rhythm, no murmurs / rubs / gallops. No extremity edema. 2+ pedal pulses  Abdomen: no tenderness, no masses palpated. No hepatosplenomegaly. Bowel sounds positive. + distention Musculoskeletal: no clubbing / cyanosis. No joint deformity upper and lower extremities. Good ROM, no contractures. Normal muscle tone.  Skin: no rashes, lesions, ulcers. No induration Neurologic: CN 2-12 grossly intact. Sensation intact,l. Strength 5/5 in all 4.  Psychiatric: Normal judgment and insight. Alert and oriented x 3. Normal mood.    Labs on Admission: I have personally reviewed following labs and imaging studies  CBC: Recent Labs  Lab 03/01/24 1450  WBC 4.0  NEUTROABS 2.6  HGB 10.3*  HCT 28.8*  MCV 107.9*  PLT 20*   Basic Metabolic Panel: Recent Labs  Lab 03/01/24 1450 03/01/24 1730  NA 134*  --   K 2.8*  --   CL 91*  --   CO2 26  --   GLUCOSE 116*  --   BUN  6  --   CREATININE 0.94  --   CALCIUM 8.3*  --   MG  --  1.6*   GFR: CrCl cannot be calculated (Unknown ideal weight.). Liver Function Tests: Recent Labs  Lab 03/01/24 1450  AST 176*  ALT 48*  ALKPHOS 76  BILITOT 7.0*  PROT 6.4*  ALBUMIN 2.8*   Recent Labs  Lab 03/01/24 1450  LIPASE 72*   Recent Labs  Lab 03/01/24 1730  AMMONIA 51*   Coagulation Profile: Recent Labs  Lab 03/01/24 1730  INR 1.9*   Cardiac Enzymes: No results for input(s): "CKTOTAL", "CKMB", "CKMBINDEX", "TROPONINI" in the last 168 hours. BNP (last 3 results) No results for input(s): "PROBNP" in the last 8760 hours. HbA1C: No  results for input(s): "HGBA1C" in the last 72 hours. CBG: No results for input(s): "GLUCAP" in the last 168 hours. Lipid Profile: No results for input(s): "CHOL", "HDL", "LDLCALC", "TRIG", "CHOLHDL", "LDLDIRECT" in the last 72 hours. Thyroid Function Tests: No results for input(s): "TSH", "T4TOTAL", "FREET4", "T3FREE", "THYROIDAB" in the last 72 hours. Anemia Panel: No results for input(s): "VITAMINB12", "FOLATE", "FERRITIN", "TIBC", "IRON", "RETICCTPCT" in the last 72 hours. Urine analysis:    Component Value Date/Time   COLORURINE AMBER (A) 03/01/2024 1519   APPEARANCEUR CLEAR 03/01/2024 1519   LABSPEC 1.030 03/01/2024 1519   PHURINE 5.0 03/01/2024 1519   GLUCOSEU NEGATIVE 03/01/2024 1519   HGBUR NEGATIVE 03/01/2024 1519   BILIRUBINUR MODERATE (A) 03/01/2024 1519   KETONESUR NEGATIVE 03/01/2024 1519   PROTEINUR 30 (A) 03/01/2024 1519   NITRITE POSITIVE (A) 03/01/2024 1519   LEUKOCYTESUR NEGATIVE 03/01/2024 1519    Radiological Exams on Admission: CT ABDOMEN PELVIS W CONTRAST Result Date: 03/01/2024 CLINICAL DATA:  Abdominal pain EXAM: CT ABDOMEN AND PELVIS WITH CONTRAST TECHNIQUE: Multidetector CT imaging of the abdomen and pelvis was performed using the standard protocol following bolus administration of intravenous contrast. RADIATION DOSE REDUCTION: This exam  was performed according to the departmental dose-optimization program which includes automated exposure control, adjustment of the mA and/or kV according to patient size and/or use of iterative reconstruction technique. CONTRAST:  75mL OMNIPAQUE IOHEXOL 350 MG/ML SOLN COMPARISON:  09/09/2023 FINDINGS: Lower chest: No acute findings. Hepatobiliary: Severe diffuse low-density throughout the liver compatible with fatty infiltration. Nodular contours of the liver compatible with cirrhosis. Multiple layering gallstones within the gallbladder. No biliary ductal dilatation. Pancreas: No focal abnormality or ductal dilatation. Spleen: No focal abnormality.  Normal size. Adrenals/Urinary Tract: No adrenal abnormality. No focal renal abnormality. No stones or hydronephrosis. Urinary bladder is unremarkable. Stomach/Bowel: Wall thickening within the right colon felt to reflect portal colopathy. Stomach and small bowel decompressed. No bowel obstruction. Vascular/Lymphatic: Aortic atherosclerosis. No evidence of aneurysm or adenopathy. Distal esophageal varices noted compatible with portal venous hypertension. Reproductive: No visible focal abnormality. Other: Moderate to large volume ascites in the abdomen or pelvis. Musculoskeletal: No acute bony abnormality. IMPRESSION: Severe hepatic steatosis with cirrhosis. Associated distal esophageal varices and moderate to large volume ascites. Cholelithiasis.  No CT evidence of acute cholecystitis. Electronically Signed   By: Charlett Nose M.D.   On: 03/01/2024 19:10    EKG: Independently reviewed. See above   Assessment/Plan  Decompensated ETOH liver disease in setting of continue ETOH abuse -+ ascites/ elevated inr and tbili of 7 with low plt at 20 /lfts trending up -MELD-NA 23 -case discussed with GI , no acute recommendations at this time , patient will be seen in am  -will continue to monitor labs   ETOH withdrawal  -place on ciwa   ETOH gastritis  -supportive  care with ppi  Distal esophageal varices -no history of dark stools or blood in emesis  -await further gi input   Elevated ammonia  -start on lactulose  Severe thrombocytopenia Auto- anticoagulation  -due to liver disease  -continue to monitor /plt and h/h   Hypokalemia  Hypomagnesemia  -in setting of continue etoh abuse  -continue with replacement per protocol       DVT prophylaxis: scd Code Status:full/ as discussed per patient wishes in event of cardiac arrest  Family Communication: none at bedside Disposition Plan:patient  expected to be admitted greater than 2 midnights  Consults called: Dr Marina Goodell -GI Admission status: progressive care  Lurline Del MD [facility]  If 7PM-7AM, please contact night-coverage [email or web address] Password [facility]  03/01/2024, 8:43 PM

## 2024-03-01 NOTE — ED Triage Notes (Signed)
 Pt here from home with c/o abd pain along with some n/v pt has known cirrhosis and admits to drinking some vodka over the last couple of days

## 2024-03-01 NOTE — ED Notes (Signed)
 Pt transported to CT ?

## 2024-03-01 NOTE — Care Management (Signed)
PCP added to AVS 

## 2024-03-02 ENCOUNTER — Inpatient Hospital Stay (HOSPITAL_COMMUNITY): Payer: MEDICAID

## 2024-03-02 ENCOUNTER — Other Ambulatory Visit: Payer: Self-pay

## 2024-03-02 DIAGNOSIS — K7011 Alcoholic hepatitis with ascites: Secondary | ICD-10-CM

## 2024-03-02 DIAGNOSIS — F10930 Alcohol use, unspecified with withdrawal, uncomplicated: Secondary | ICD-10-CM

## 2024-03-02 DIAGNOSIS — K2920 Alcoholic gastritis without bleeding: Secondary | ICD-10-CM

## 2024-03-02 DIAGNOSIS — E876 Hypokalemia: Secondary | ICD-10-CM

## 2024-03-02 DIAGNOSIS — R933 Abnormal findings on diagnostic imaging of other parts of digestive tract: Secondary | ICD-10-CM

## 2024-03-02 DIAGNOSIS — R188 Other ascites: Secondary | ICD-10-CM

## 2024-03-02 DIAGNOSIS — K766 Portal hypertension: Secondary | ICD-10-CM

## 2024-03-02 DIAGNOSIS — K8070 Calculus of gallbladder and bile duct without cholecystitis without obstruction: Secondary | ICD-10-CM

## 2024-03-02 DIAGNOSIS — F1090 Alcohol use, unspecified, uncomplicated: Secondary | ICD-10-CM

## 2024-03-02 DIAGNOSIS — K7031 Alcoholic cirrhosis of liver with ascites: Secondary | ICD-10-CM

## 2024-03-02 HISTORY — PX: IR PARACENTESIS: IMG2679

## 2024-03-02 HISTORY — DX: Alcoholic cirrhosis of liver with ascites: K70.31

## 2024-03-02 HISTORY — DX: Alcoholic hepatitis with ascites: K70.11

## 2024-03-02 LAB — HEPATITIS B SURFACE ANTIBODY,QUALITATIVE: Hep B S Ab: NONREACTIVE

## 2024-03-02 LAB — COMPREHENSIVE METABOLIC PANEL
ALT: 49 U/L — ABNORMAL HIGH (ref 0–44)
AST: 184 U/L — ABNORMAL HIGH (ref 15–41)
Albumin: 2.5 g/dL — ABNORMAL LOW (ref 3.5–5.0)
Alkaline Phosphatase: 67 U/L (ref 38–126)
Anion gap: 14 (ref 5–15)
BUN: <5 mg/dL — ABNORMAL LOW (ref 6–20)
CO2: 23 mmol/L (ref 22–32)
Calcium: 8.1 mg/dL — ABNORMAL LOW (ref 8.9–10.3)
Chloride: 95 mmol/L — ABNORMAL LOW (ref 98–111)
Creatinine, Ser: 0.87 mg/dL (ref 0.61–1.24)
GFR, Estimated: >60 mL/min (ref >60)
Glucose, Bld: 90 mg/dL (ref 70–99)
Potassium: 5 mmol/L (ref 3.5–5.1)
Sodium: 132 mmol/L — ABNORMAL LOW (ref 135–145)
Total Bilirubin: 7 mg/dL — ABNORMAL HIGH (ref 0.0–1.2)
Total Protein: 5.8 g/dL — ABNORMAL LOW (ref 6.5–8.1)

## 2024-03-02 LAB — HEPATITIS C ANTIBODY: HCV Ab: REACTIVE — AB

## 2024-03-02 LAB — CBC
HCT: 26.6 % — ABNORMAL LOW (ref 39.0–52.0)
Hemoglobin: 9.3 g/dL — ABNORMAL LOW (ref 13.0–17.0)
MCH: 38.9 pg — ABNORMAL HIGH (ref 26.0–34.0)
MCHC: 35 g/dL (ref 30.0–36.0)
MCV: 111.3 fL — ABNORMAL HIGH (ref 80.0–100.0)
Platelets: 23 10*3/uL — CL (ref 150–400)
RBC: 2.39 MIL/uL — ABNORMAL LOW (ref 4.22–5.81)
RDW: 15 % (ref 11.5–15.5)
WBC: 4.4 10*3/uL (ref 4.0–10.5)
nRBC: 0.5 % — ABNORMAL HIGH (ref 0.0–0.2)

## 2024-03-02 LAB — URINE CULTURE: Culture: <10000 — AB

## 2024-03-02 LAB — BLOOD GAS, VENOUS
Acid-Base Excess: 9.1 mmol/L — ABNORMAL HIGH (ref 0.0–2.0)
Bicarbonate: 34.2 mmol/L — ABNORMAL HIGH (ref 20.0–28.0)
Drawn by: 59174
O2 Saturation: 49.8 %
Patient temperature: 36.8
pCO2, Ven: 47 mmHg (ref 44–60)
pH, Ven: 7.47 — ABNORMAL HIGH (ref 7.25–7.43)
pO2, Ven: <31 mmHg — CL (ref 32–45)

## 2024-03-02 LAB — BODY FLUID CELL COUNT WITH DIFFERENTIAL
Eos, Fluid: 0 %
Lymphs, Fluid: 15 %
Monocyte-Macrophage-Serous Fluid: 83 % (ref 50–90)
Neutrophil Count, Fluid: 2 % (ref 0–25)
Total Nucleated Cell Count, Fluid: 36 uL (ref 0–1000)

## 2024-03-02 LAB — PROTIME-INR
INR: 2.1 — ABNORMAL HIGH (ref 0.8–1.2)
Prothrombin Time: 23.7 s — ABNORMAL HIGH (ref 11.4–15.2)

## 2024-03-02 LAB — ALBUMIN, PLEURAL OR PERITONEAL FLUID: Albumin, Fluid: <1.5 g/dL

## 2024-03-02 LAB — GRAM STAIN

## 2024-03-02 LAB — PROTEIN, PLEURAL OR PERITONEAL FLUID: Total protein, fluid: <3 g/dL

## 2024-03-02 LAB — HEPATITIS B SURFACE ANTIGEN: Hepatitis B Surface Ag: NONREACTIVE

## 2024-03-02 LAB — LACTIC ACID, PLASMA: Lactic Acid, Venous: 5.6 mmol/L (ref 0.5–1.9)

## 2024-03-02 LAB — FOLATE: Folate: 4.1 ng/mL — ABNORMAL LOW (ref >5.9)

## 2024-03-02 LAB — VITAMIN B12: Vitamin B-12: 1284 pg/mL — ABNORMAL HIGH (ref 180–914)

## 2024-03-02 LAB — HEPATITIS A ANTIBODY, TOTAL: hep A Total Ab: REACTIVE — AB

## 2024-03-02 MED ORDER — LIDOCAINE HCL 1 % IJ SOLN
20.0000 mL | Freq: Once | INTRAMUSCULAR | Status: AC
Start: 2024-03-02 — End: 2024-03-02
  Administered 2024-03-02: 10 mL
  Filled 2024-03-02: qty 20

## 2024-03-02 MED ORDER — LIDOCAINE HCL 1 % IJ SOLN
INTRAMUSCULAR | Status: AC
  Filled 2024-03-02: qty 20

## 2024-03-02 MED ORDER — SODIUM CHLORIDE 0.9 % IV SOLN
2.0000 g | INTRAVENOUS | Status: DC
  Administered 2024-03-02 – 2024-03-05 (×4): 2 g via INTRAVENOUS
  Filled 2024-03-02 (×4): qty 20

## 2024-03-02 NOTE — Consult Note (Signed)
 Consultation Note   Referring Provider:  [facility] PCP: Patient, [name] Primary Gastroenterologist:     Gentry Fitz Reason for Consultation: Cirrhosis DOA: 03/01/2024         Hospital Day: 2   ASSESSMENT    Brief Narrative:  38 y.o. year old male with a history of alcohol abuse, traumatic brain injury, cholelithiasis , depression, panic attacks.  Patient admitted with nausea, vomiting, abdominal pain.    Recently diagnosed decompensated cirrhosis with portal hypertension including coagulopathy, thrombocytopenia, ascites, distal esophageal varices and moderate to large volume ascites on imaging. Cirrhosis probably Etoh related but other etiologies will need to be excluded, especially given young age. Actively consuming Etoh, may have superimposed Etoh hepatitis.  MELD 3.0: 25 at 03/02/2024  4:17 AM MDF is 46.1 ( poor prognosis)  Alcohol abuse.  EtOH level elevated on admission.  Macrocytic anemia.  Likely secondary to bone marrow suppression and or nutritional deficits.  Right bowel wall thickening on CT scan - probably portal colopathy.   Cholelithiasis.  No CT evidence of acute cholecystitis.  Principal Problem:   Alcohol withdrawal (HCC)      PLAN:   --Continue vitamin  B1 --Will change to a 2 g sodium restricted diet --Continue twice daily lactulose --Will add empiric Rocephin 1 g daily  --Diagnostic paracentesis if safe given severe thrombocytopenia.  ( I spoke with IR, they are okay proceeding).  If negative for SBP then will start steroids for Etoh hepatitis)  --Electrolyte repletion in progress --Eventual EGD for varicess screening --Will obtain labs for chronic viral hepatitis ( Hx of IV drug use), autoimmune disease. Will need full hepatic serologic evaluation as outpatient.  --B12 and folate levels --HCC screening. No focal liver lesions reported on CT with contrast. Will obtain AFP --Total Etoh going  forward. CIWA protocol in progress   HPI   Gregory Murphy presented to the ED yesterday with complaints of abdominal pain, nausea and vomiting. Not forthcoming with details. No abdominal pain today and not describing any recent abdominal pain.   He says he was told not too long ago at [facility] that he had liver disease.  He continues to drink alcohol.  He has a history of IV drug use but none in the last couple of years.  He denies hematemesis, melena or frank blood in stool.  He does not take any medications at home.  Father and sister apparently had liver disease but patient does not know etiology.     Recent Labs    03/01/24 1450 03/02/24 0417  WBC 4.0 4.4  HGB 10.3* 9.3*  HCT 28.8* 26.6*  PLT 20* 23*   Recent Labs    03/01/24 1450 03/01/24 2233 03/02/24 0417  NA 134* 137 132*  K 2.8* 4.1 5.0  CL 91* 94* 95*  CO2 26 26 23   GLUCOSE 116* 85 90  BUN 6 5* <5*  CREATININE 0.94 0.79 0.87  CALCIUM 8.3* 8.4* 8.1*   Recent Labs    03/02/24 0417  PROT 5.8*  ALBUMIN 2.5*  AST 184*  ALT 49*  ALKPHOS 67  BILITOT 7.0*   Recent Labs    03/01/24 1730 03/02/24 0417  LABPROT 21.7* 23.7*  INR 1.9* 2.1*  Past Medical History:  Diagnosis Date   Brain injury (HCC)    Depression    Insomnia    Panic attack     Past Surgical History:  Procedure Laterality Date   ABDOMINAL SURGERY     CLOSED REDUCTION FINGER WITH PERCUTANEOUS PINNING Left 02/04/2024   Procedure: LEFT THUMB CLOSED REDUCTION FINGER WITH PERCUTANEOUS PINNing;  Surgeon: Samuella Cota, MD;  Location: [facility];  Service: Orthopedics;  Laterality: Left;   DISTAL FEMUR BONE BRIDGE EXCISION Left    FEMUR CLOSED REDUCTION Left 12/17/2021   Procedure: CLOSED REDUCTION FEMORAL SHAFT;  Surgeon: [name], MD;  Location: [facility];  Service: Orthopedics;  Laterality: Left;   FOREARM SURGERY Left    x4-metal rods placed   I & D EXTREMITY Left 02/04/2024   Procedure: IRRIGATION AND  DEBRIDEMENT OF LEFT THUMB;  Surgeon: Samuella Cota, MD;  Location: [facility];  Service: Orthopedics;  Laterality: Left;   LEG SURGERY Right 2023   NAILBED REPAIR Left 02/04/2024   Procedure: LEFT THUMB NAILBED REPAIR;  Surgeon: Samuella Cota, MD;  Location: [facility];  Service: Orthopedics;  Laterality: Left;   ORIF FEMUR FRACTURE Left 12/20/2021   Procedure: OPEN REDUCTION INTERNAL FIXATION (ORIF) DISTAL FEMUR FRACTURE;  Surgeon: Roby Lofts, MD;  Location: [facility];  Service: Orthopedics;  Laterality: Left;   TIBIA IM NAIL INSERTION Bilateral 12/17/2021   Procedure: RIGHT INTRAMEDULLARY (IM) NAIL TIBIAL; RIGHT CLOSED TREATMENT OF FIBULA FRACTURE; LEFT CLOSED REDUCTION SPLINTING OF PERIPROSTHETIC  SUPRACONDYLAR FEMUR FRACTURE;  Surgeon: [name], MD;  Location: [facility];  Service: Orthopedics;  Laterality: Bilateral;   WRIST ARTHROSCOPY WITH CARPOMETACARPEL [place]) ARTHROPLASTY Left 03/21/2022   Procedure: left wrist arthroscopy with debridement as needed;  Surgeon: Dairl Ponder, MD;  Location: [facility];  Service: Orthopedics;  Laterality: Left;  just needs 60 mins for surgery   WRIST SURGERY Right    pins   FMH: Father and sister had liver disease of unknown cause  Prior to Admission medications   Medication Sig Start Date End Date Taking? Authorizing Provider  vitamin B-12 (CYANOCOBALAMIN) 500 MCG tablet Take 500 mcg by mouth daily.   Yes [provider]    Current Facility-Administered Medications  Medication Dose Route Frequency Provider Last Rate Last Admin   albuterol (PROVENTIL) (2.5 MG/3ML) 0.083% nebulizer solution 2.5 mg  2.5 mg Nebulization Q2H PRN Skip Mayer A, MD       docusate sodium (COLACE) capsule 100 mg  100 mg Oral BID Skip Mayer A, MD   100 mg at 03/01/24 2217   folic acid (FOLVITE) tablet 1 mg  1 mg Oral Daily Skip Mayer A, MD   1 mg at 03/01/24 2217   lactulose (CHRONULAC) 10  GM/15ML solution 10 g  10 g Oral BID Skip Mayer A, MD   10 g at 03/01/24 2216   LORazepam (ATIVAN) tablet 1-4 mg  1-4 mg Oral Q1H PRN Lurline Del, MD   1 mg at 03/02/24 1610   Or   LORazepam (ATIVAN) injection 1-4 mg  1-4 mg Intravenous Q1H PRN Lurline Del, MD       multivitamin with minerals tablet 1 tablet  1 tablet Oral Daily Skip Mayer A, MD   1 tablet at 03/01/24 2216   ondansetron (ZOFRAN) tablet 4 mg  4 mg Oral Q6H PRN Lurline Del, MD       Or   ondansetron [facility]) injection 4 mg  4  mg Intravenous Q6H PRN Lurline Del, MD       thiamine (VITAMIN B1) tablet 100 mg  100 mg Oral QHS Skip Mayer A, MD       Or   thiamine (VITAMIN B1) injection 100 mg  100 mg Intravenous QHS Lurline Del, MD       traMADol Janean Sark) tablet 50 mg  50 mg Oral Q8H PRN Lurline Del, MD       Current Outpatient Medications  Medication Sig Dispense Refill   vitamin B-12 (CYANOCOBALAMIN) 500 MCG tablet Take 500 mcg by mouth daily.      Allergies as of 03/01/2024   (No Known Allergies)    Social History   Socioeconomic History   Marital status: Single    Spouse name: Not on file   Number of children: Not on file   Years of education: Not on file   Highest education level: Not on file  Occupational History   Not on file  Tobacco Use   Smoking status: Every Day    Current packs/day: 0.50    Average packs/day: 0.5 packs/day for 14.0 years (7.0 ttl pk-yrs)    Types: Cigarettes   Smokeless tobacco: Never  Vaping Use   Vaping status: Some Days  Substance and Sexual Activity   Alcohol use: Not Currently    Alcohol/week: 28.0 standard drinks of alcohol    Types: 28 Cans of beer per week    Comment: admits to drinking today 4-5   Drug use: Not Currently    Comment: "clean for 5 years"   Sexual activity: Yes    Birth control/protection: Condom  Other Topics Concern   Not on file  Social History Narrative   Not on file   Social  Drivers of Health   Financial Resource Strain: Not on file  Food Insecurity: Not on file  Transportation Needs: Not on file  Physical Activity: Not on file  Stress: Not on file  Social Connections: Not on file  Intimate Partner Violence: Not on file    Code Status   Code Status: Full Code  Review of Systems: All systems reviewed and negative except where noted in HPI.  Physical Exam: Vital signs in last 24 hours: Temp:  [98 F (36.7 C)-99 F (37.2 C)] 99 F (37.2 C) (03/17 0808) Pulse Rate:  [100-139] 120 (03/17 0803) Resp:  [13-25] 25 (03/17 0411) BP: (119-140)/(81-110) 137/96 (03/17 0803) SpO2:  [97 %-100 %] 97 % (03/17 0800) Weight:  [50 kg] 50 kg (03/16 2304)    General:  Thin male in NAD Psych:  Cooperative. Normal mood and affect Eyes: Pupils equal Ears:  Normal auditory acuity Nose: No deformity, discharge or lesions Neck:  Supple, no masses felt Lungs:  Clear to auscultation.  Heart:  Regular rate, regular rhythm.  Abdomen:  Soft, moderately distended, nontender, active bowel sounds, no masses felt Rectal :  Deferred Msk: Symmetrical without gross deformities.  Neurologic:  Alert, oriented, grossly normal neurologically Extremities : 1+ BLE edema Skin:  Intact without significant lesions.    Intake/Output from previous day: 03/16 0701 - 03/17 0700 In: 1000 [IV Piggyback:1000] Out: -  Intake/Output this shift:  No intake/output data recorded.   Willette Cluster, NP-C   03/02/2024, 8:54 AM

## 2024-03-02 NOTE — Progress Notes (Signed)
 Patient to 5W26 at this time

## 2024-03-02 NOTE — ED Notes (Signed)
Transport called for pt.

## 2024-03-02 NOTE — ED Notes (Signed)
 Transport called for pt x2.

## 2024-03-02 NOTE — Plan of Care (Signed)

## 2024-03-02 NOTE — ED Notes (Signed)
 Pt off the floor with transport, in no new onset distress at this time.

## 2024-03-02 NOTE — Procedures (Signed)
 PROCEDURE SUMMARY:  Successful US guided paracentesis from right lateral abdomen.  Yielded 1.0 liters of yellow fluid.  No immediate complications.  Pt tolerated well.   Specimen was sent for labs.  EBL < 5mL  Hoyt Koch PA-C 03/02/2024 1:04 PM

## 2024-03-02 NOTE — Progress Notes (Signed)
 Progress Note   Patient: Gregory Murphy ZOX:[id number] DOB: Nov 02, 1986 DOA: 03/01/2024     1 DOS: the patient was seen and examined on 03/02/2024   Brief hospital course: Gregory Murphy is a 38 y.o. male with medical history significant of ETOH abuse, TBI, depression, panic attack who presents to ED with complaints of abdominal pain, nausea, vomiting. Patient noted last drink was yesterday.  Patient is admitted to [facility] service for alcohol liver disease, EtOH withdrawal, alcoholic gastritis.  Assessment and Plan: Decompensated alcoholic liver disease Ascites/ elevated inr and tbili of 7 with low plt at 20 /lfts trending up MELD-NA 23 GI evaluation appreciated.  Continue twice daily lactulose, empiric Rocephin therapy. Patient will have diagnostic paracentesis with IR today. Continue to replete electrolytes. Hepatitis autoimmune panel labs per GI. Outpatient GI follow-up suggested.   Alcohol withdrawal  Continue CIWA protocol. Will continue thiamine, folate and multivitamin. He is at high risk for DTs. Discussed with him to quit alcohol and smoking.   ETOH gastritis  Continue antiemetics, PPI therapy.   Distal esophageal varices GI eval for EGD and variceal screening possibly outpatient.   Elevated ammonia  Continue lactulose   Severe thrombocytopenia Platelet count is 20,000 In the setting of liver disease Patient is not actively bleeding.   Continue to monitor daily platelet.   Hypokalemia  Hypomagnesemia  Due to alcohol abuse  Will continue with replacement per protocol      Out of bed to chair. Incentive spirometry. Nursing supportive care. Fall, aspiration precautions. Diet:  Diet Orders (From admission, onward)     Start     Ordered   03/02/24 0903  Diet 2 gram sodium Room service appropriate? Yes; Fluid consistency: Thin  Diet effective now       Question Answer Comment  Room service appropriate? Yes   Fluid consistency: Thin      03/02/24  0903           DVT prophylaxis: Place TED hose Start: 03/01/24 2150  Level of care: progressive   Code Status: Full Code  Subjective: Patient is seen and examined today morning.  He is lying comfortably, has tachycardia, tachypnea.  Denies any nausea.  Able to tolerate clears.  Discussed with him regarding alcohol and smoking cessation.  Physical Exam: Vitals:   03/02/24 1050 03/02/24 1123 03/02/24 1200 03/02/24 1300  BP:   (!) 124/91   Pulse:  (!) 111 (!) 124 (!) 113  Resp:  (!) 21 (!) 23 (!) 24  Temp: 98.9 F (37.2 C)     TempSrc: Oral     SpO2: 95% 94% 96% 91%  Weight:      Height:        General - Young Caucasian male, mild distress noted. HEENT - PERRLA, EOMI, atraumatic head, non tender sinuses. Lung - Clear, diffuse rales, no rhonchi, wheezes. Heart - S1, S2 heard, no murmurs, rubs, trace pedal edema. Abdomen - Soft, non tender distended, bowel sounds good Neuro - Alert, awake and oriented x 3, non focal exam. Skin - Warm and dry.  Data Reviewed:      Latest Ref Rng & Units 03/02/2024    4:17 AM 03/01/2024    2:50 PM 01/31/2024    1:14 PM  CBC  WBC 4.0 - 10.5 K/uL 4.4  4.0  2.7   Hemoglobin 13.0 - 17.0 g/dL 9.3  81.1  91.4   Hematocrit 39.0 - 52.0 % 26.6  28.8  30.2   Platelets 150 - 400 K/uL  23  20  29        Latest Ref Rng & Units 03/02/2024    4:17 AM 03/01/2024   10:33 PM 03/01/2024    2:50 PM  BMP  Glucose 70 - 99 mg/dL 90  85  536   BUN 6 - 20 mg/dL 5  5  6    Creatinine 0.61 - 1.24 mg/dL 6.44  0.34  7.42   Sodium 135 - 145 mmol/L 132  137  134   Potassium 3.5 - 5.1 mmol/L 5.0  4.1  2.8   Chloride 98 - 111 mmol/L 95  94  91   CO2 22 - 32 mmol/L 23  26  26    Calcium 8.9 - 10.3 mg/dL 8.1  8.4  8.3    CT ABDOMEN PELVIS W CONTRAST Result Date: 03/01/2024 CLINICAL DATA:  Abdominal pain EXAM: CT ABDOMEN AND PELVIS WITH CONTRAST TECHNIQUE: Multidetector CT imaging of the abdomen and pelvis was performed using the standard protocol following bolus  administration of intravenous contrast. RADIATION DOSE REDUCTION: This exam was performed according to the departmental dose-optimization program which includes automated exposure control, adjustment of the mA and/or kV according to patient size and/or use of iterative reconstruction technique. CONTRAST:  75mL OMNIPAQUE IOHEXOL 350 MG/ML SOLN COMPARISON:  09/09/2023 FINDINGS: Lower chest: No acute findings. Hepatobiliary: Severe diffuse low-density throughout the liver compatible with fatty infiltration. Nodular contours of the liver compatible with cirrhosis. Multiple layering gallstones within the gallbladder. No biliary ductal dilatation. Pancreas: No focal abnormality or ductal dilatation. Spleen: No focal abnormality.  Normal size. Adrenals/Urinary Tract: No adrenal abnormality. No focal renal abnormality. No stones or hydronephrosis. Urinary bladder is unremarkable. Stomach/Bowel: Wall thickening within the right colon felt to reflect portal colopathy. Stomach and small bowel decompressed. No bowel obstruction. Vascular/Lymphatic: Aortic atherosclerosis. No evidence of aneurysm or adenopathy. Distal esophageal varices noted compatible with portal venous hypertension. Reproductive: No visible focal abnormality. Other: Moderate to large volume ascites in the abdomen or pelvis. Musculoskeletal: No acute bony abnormality. IMPRESSION: Severe hepatic steatosis with cirrhosis. Associated distal esophageal varices and moderate to large volume ascites. Cholelithiasis.  No CT evidence of acute cholecystitis. Electronically Signed   By: Charlett Nose M.D.   On: 03/01/2024 19:10    Family Communication: Discussed with patient, he understand and agree. All questions answered.  Disposition: Status is: Inpatient Remains inpatient appropriate because: alcohol liver disease, paracentesis  Planned Discharge Destination: Home     Time spent: 39 minutes  Author: Marcelino Duster, MD 03/02/2024 1:41 PM Secure  chat 7am to 7pm For on call review [email or web address].

## 2024-03-02 NOTE — ED Notes (Addendum)
 Pt reports last drink yesterday, 2 beers in amount of "standard" size.

## 2024-03-03 DIAGNOSIS — R768 Other specified abnormal immunological findings in serum: Secondary | ICD-10-CM

## 2024-03-03 DIAGNOSIS — R772 Abnormality of alphafetoprotein: Secondary | ICD-10-CM

## 2024-03-03 DIAGNOSIS — D696 Thrombocytopenia, unspecified: Secondary | ICD-10-CM

## 2024-03-03 DIAGNOSIS — E878 Other disorders of electrolyte and fluid balance, not elsewhere classified: Secondary | ICD-10-CM | POA: Insufficient documentation

## 2024-03-03 DIAGNOSIS — Z720 Tobacco use: Secondary | ICD-10-CM | POA: Insufficient documentation

## 2024-03-03 DIAGNOSIS — K2920 Alcoholic gastritis without bleeding: Secondary | ICD-10-CM | POA: Insufficient documentation

## 2024-03-03 DIAGNOSIS — K7011 Alcoholic hepatitis with ascites: Secondary | ICD-10-CM

## 2024-03-03 DIAGNOSIS — D539 Nutritional anemia, unspecified: Secondary | ICD-10-CM

## 2024-03-03 HISTORY — DX: Nutritional anemia, unspecified: D53.9

## 2024-03-03 HISTORY — DX: Abnormality of alphafetoprotein: R77.2

## 2024-03-03 HISTORY — DX: Alcoholic gastritis without bleeding: K29.20

## 2024-03-03 HISTORY — DX: Thrombocytopenia, unspecified: D69.6

## 2024-03-03 HISTORY — DX: Other specified abnormal immunological findings in serum: R76.8

## 2024-03-03 LAB — CBC
HCT: 23.6 % — ABNORMAL LOW (ref 39.0–52.0)
Hemoglobin: 8.4 g/dL — ABNORMAL LOW (ref 13.0–17.0)
MCH: 38.5 pg — ABNORMAL HIGH (ref 26.0–34.0)
MCHC: 35.6 g/dL (ref 30.0–36.0)
MCV: 108.3 fL — ABNORMAL HIGH (ref 80.0–100.0)
Platelets: 18 10*3/uL — CL (ref 150–400)
RBC: 2.18 MIL/uL — ABNORMAL LOW (ref 4.22–5.81)
RDW: 14.5 % (ref 11.5–15.5)
WBC: 4.2 10*3/uL (ref 4.0–10.5)
nRBC: 0 % (ref 0.0–0.2)

## 2024-03-03 LAB — COMPREHENSIVE METABOLIC PANEL
ALT: 42 U/L (ref 0–44)
AST: 138 U/L — ABNORMAL HIGH (ref 15–41)
Albumin: 2.3 g/dL — ABNORMAL LOW (ref 3.5–5.0)
Alkaline Phosphatase: 60 U/L (ref 38–126)
Anion gap: 9 (ref 5–15)
BUN: 6 mg/dL (ref 6–20)
CO2: 26 mmol/L (ref 22–32)
Calcium: 8 mg/dL — ABNORMAL LOW (ref 8.9–10.3)
Chloride: 97 mmol/L — ABNORMAL LOW (ref 98–111)
Creatinine, Ser: 0.74 mg/dL (ref 0.61–1.24)
GFR, Estimated: >60 mL/min (ref >60)
Glucose, Bld: 83 mg/dL (ref 70–99)
Potassium: 3.9 mmol/L (ref 3.5–5.1)
Sodium: 132 mmol/L — ABNORMAL LOW (ref 135–145)
Total Bilirubin: 6.9 mg/dL — ABNORMAL HIGH (ref 0.0–1.2)
Total Protein: 5.3 g/dL — ABNORMAL LOW (ref 6.5–8.1)

## 2024-03-03 LAB — TYPE AND SCREEN
ABO/RH(D): A POS
Antibody Screen: NEGATIVE

## 2024-03-03 LAB — ANA: Anti Nuclear Antibody (ANA): POSITIVE — AB

## 2024-03-03 LAB — ANTI-SMOOTH MUSCLE ANTIBODY, IGG: F-Actin IgG: 9 U (ref 0–19)

## 2024-03-03 LAB — HEPATITIS B CORE ANTIBODY, TOTAL: HEP B CORE AB: NEGATIVE

## 2024-03-03 LAB — PROTIME-INR
INR: 2.6 — ABNORMAL HIGH (ref 0.8–1.2)
Prothrombin Time: 28.5 s — ABNORMAL HIGH (ref 11.4–15.2)

## 2024-03-03 LAB — HCV RNA QUANT: HCV Quantitative: NOT DETECTED [IU]/mL (ref >50)

## 2024-03-03 LAB — MITOCHONDRIAL ANTIBODIES: Mitochondrial M2 Ab, IgG: <20 U (ref 0.0–20.0)

## 2024-03-03 LAB — AFP TUMOR MARKER: AFP, Serum, Tumor Marker: 7.3 ng/mL — ABNORMAL HIGH (ref 0.0–6.9)

## 2024-03-03 LAB — LACTIC ACID, PLASMA: Lactic Acid, Venous: 1.1 mmol/L (ref 0.5–1.9)

## 2024-03-03 MED ORDER — SODIUM CHLORIDE 0.9% IV SOLUTION: Freq: Once | INTRAVENOUS | Status: AC

## 2024-03-03 NOTE — Assessment & Plan Note (Signed)
 Platelet with slight worsening to 18 and hemoglobin decreased to 8.4.  No obvious bleeding but patient will remain very high risk. -Ordered 2 unit of platelets

## 2024-03-03 NOTE — Plan of Care (Signed)
   Problem: Education: Goal: Knowledge of General Education information will improve Description: Including pain rating scale, medication(s)/side effects and non-pharmacologic comfort measures Outcome: Not Progressing   Problem: Health Behavior/Discharge Planning: Goal: Ability to manage health-related needs will improve Outcome: Not Progressing

## 2024-03-03 NOTE — Assessment & Plan Note (Signed)
 Hemoglobin with further decreased to 8.4.  Folic acid deficiency with high B12. -Continue with folic acid supplement -Monitor hemoglobin -Transfuse below 7

## 2024-03-03 NOTE — Assessment & Plan Note (Signed)
 Patient want to smoke cigarette and denies nicotine patch or gum. -Counseling was provided

## 2024-03-03 NOTE — Progress Notes (Signed)
 Progress Note   Patient: Gregory Murphy ZHY:[id number] DOB: 03/22/1986 DOA: 03/01/2024     2 DOS: the patient was seen and examined on 03/03/2024   Brief hospital course: Taken from prior notes.  Saxon Demitrios Molyneux is a 38 y.o. male with medical history significant of ETOH abuse, TBI, depression, panic attack who presents to ED with complaints of abdominal pain, nausea, vomiting. Patient noted last drink was yesterday.   He was admitted for decompensated alcoholic cirrhosis, alcohol withdrawal and alcoholic gastritis.  Gastroenterology was also consulted.  3/18: Vitals with mild tachycardia, labs with improving liver enzymes, T. bili stable at 6.9, CBC with decrease of hemoglobin to 8.4 and platelet of 18, transfusing platelets, AFP came back elevated at 7.3 so liver MRI ordered. Paracentesis labs appears transudative and no organisms seen.  Decreased folate at 4.1 with elevated B12-patient is on supplement.  Hepatitis C antibody positive, HCV RNA quantitative labs are pending.  ANA, anti-smooth muscle antibody, mitochondrial antibody results are pending.  Patient was threatening to leave AMA stating that he will be back after drinking a glass of wine and smoking few cigarettes.  Counseling was provided again.  Patient is high risk for mortality and readmission.  Assessment and Plan: * Alcoholic cirrhosis of liver with ascites (HCC) Patient likely with decompensated alcoholic cirrhosis and an element of going into liver failure with worsening T. bili, low albumin and worsening INR.  Madrey score of 46 S/p paracentesis-preliminary results consistent with transudate and preliminary cultures negative. Elevated AFP-liver MRI ordered Hepatitis panel with positive hep C antibodies-quantitative assay pending. Need outpatient EGD for esophageal varices-concern of portal hypertension. GI is on board-appreciate their help -Likely will need steroid-we will defer that decision to GI -Continue  with supportive care  Alcohol withdrawal (HCC) CIWA score of 8  -Continue with CIWA protocol -Counseling was provided  Tobacco abuse Patient want to smoke cigarette and denies nicotine patch or gum. -Counseling was provided  Macrocytic anemia Hemoglobin with further decreased to 8.4.  Folic acid deficiency with high B12. -Continue with folic acid supplement -Monitor hemoglobin -Transfuse below 7  Electrolyte abnormality Patient had mild persistent hyponatremia likely due to alcoholic cirrhosis.  Hypokalemia and hypomagnesemia has been improved -Monitor electrolytes -Replete as needed  Severe thrombocytopenia (HCC) Platelet with slight worsening to 18 and hemoglobin decreased to 8.4.  No obvious bleeding but patient will remain very high risk. -Ordered 2 unit of platelets  Alcoholic gastritis No active bleeding. -Continue with PPI   Subjective: Patient was seen and examined today.  Denies any dark-colored stool or blood in urine.  No nausea or vomiting.  He wants to go home, stating that he will be back after taking a glass of wine and smoking few cigarettes.  Physical Exam: Vitals:   03/03/24 0339 03/03/24 0800 03/03/24 1130 03/03/24 1145  BP:   116/72 119/86  Pulse: (!) 106 (!) 118 91 94  Resp:   12 12  Temp: 99.2 F (37.3 C)  98.6 F (37 C) 98.6 F (37 C)  TempSrc: Oral  Oral Oral  SpO2:   98% 98%  Weight:      Height:       General.  Ill-appearing, malnourished gentleman, in no acute distress. Pulmonary.  Lungs clear bilaterally, normal respiratory effort. CV.  Regular rate and rhythm, no JVD, rub or murmur. Abdomen.  Soft, nontender, nondistended, BS positive. CNS.  Alert and oriented .  No focal neurologic deficit. Extremities.  No edema, no cyanosis, pulses intact and  symmetrical. Psychiatry.  Judgment and insight appears normal.   Data Reviewed: Prior data reviewed  Family Communication: Patient does not want me to talk with any family  member.  Disposition: Status is: Inpatient Remains inpatient appropriate because: Severity of illness  Planned Discharge Destination: Home  Time spent: 55 minutes  This record has been created using Conservation officer, historic buildings. Errors have been sought and corrected,but may not always be located. Such creation errors do not reflect on the standard of care.   Author: Arnetha Courser, MD 03/03/2024 1:05 PM  For on call review [email or web address].

## 2024-03-03 NOTE — Assessment & Plan Note (Signed)
 CIWA score of 8  -Continue with CIWA protocol -Counseling was provided

## 2024-03-03 NOTE — Hospital Course (Addendum)
 Taken from prior notes.  Gregory Murphy is a 38 y.o. male with medical history significant of ETOH abuse, TBI, depression, panic attack who presents to ED with complaints of abdominal pain, nausea, vomiting. Patient noted last drink was yesterday.   He was admitted for decompensated alcoholic cirrhosis, alcohol withdrawal and alcoholic gastritis.  Gastroenterology was also consulted.  3/18: Vitals with mild tachycardia, labs with improving liver enzymes, T. bili stable at 6.9, CBC with decrease of hemoglobin to 8.4 and platelet of 18, transfusing platelets, AFP came back elevated at 7.3 so liver MRI ordered. Paracentesis labs appears transudative and no organisms seen.  Decreased folate at 4.1 with elevated B12-patient is on supplement.  Hepatitis C antibody positive, HCV RNA quantitative labs are pending.  ANA, anti-smooth muscle antibody, mitochondrial antibody results are pending.  Patient was threatening to leave AMA stating that he will be back after drinking a glass of wine and smoking few cigarettes.  Counseling was provided again.  Patient is high risk for mortality and readmission.

## 2024-03-03 NOTE — Assessment & Plan Note (Addendum)
 Patient likely with decompensated alcoholic cirrhosis and an element of going into liver failure with worsening T. bili, low albumin and worsening INR.  Madrey score of 46 S/p paracentesis-preliminary results consistent with transudate and preliminary cultures negative. Elevated AFP-liver MRI ordered Hepatitis panel with positive hep C antibodies-quantitative assay pending. Need outpatient EGD for esophageal varices-concern of portal hypertension. GI is on board-appreciate their help -Likely will need steroid-we will defer that decision to GI -Continue with supportive care

## 2024-03-03 NOTE — Assessment & Plan Note (Signed)
 Patient had mild persistent hyponatremia likely due to alcoholic cirrhosis.  Hypokalemia and hypomagnesemia has been improved -Monitor electrolytes -Replete as needed

## 2024-03-03 NOTE — TOC CM/SW Note (Signed)
 Transition of Care [facility]) - Inpatient Brief Assessment   Patient Details  Name: Gregory Murphy MRN: [id number] Date of Birth: [birth year]  [facility] CM/SW Contact:    Mearl Latin, LCSW Phone Number: 03/03/2024, 5:05 PM   Clinical Narrative: Patient admitted from home and does not have insurance or a PCP. CSW following for substance use consult.    Transition of Care Asessment: Insurance and Status: Selfpay Patient has primary care physician: No Home environment has been reviewed: From home Prior level of function:: Independent Prior/Current Home Services: No current home services Social Drivers of Health Review: SDOH reviewed no interventions necessary Readmission risk has been reviewed: Yes Transition of care needs: transition of care needs identified, TOC will continue to follow

## 2024-03-03 NOTE — Assessment & Plan Note (Signed)
 No active bleeding. -Continue with PPI

## 2024-03-03 NOTE — Progress Notes (Signed)
 Daily Progress Note  DOA: 03/01/2024 Hospital Day: 3   Chief Complaint: Decompensated cirrhosis  ASSESSMENT    38 y.o. year old male with a history of alcohol abuse, traumatic brain injury, cholelithiasis , depression, panic attacks.  Patient admitted with nausea, vomiting, abdominal pain and decompensated cirrhosis. GI saw in consult on 3/17   History of Etoh abuse / + HCV antibody and recently diagnosed decompensated cirrhosis . Actively consuming Etoh, may have superimposed Etoh hepatitis.  MELD 3.0: 25 at 03/02/2024  4:17 AM MDF is 46.1 ( poor prognosis) Unable to calculate SAAG with ascitic fluid albumin < 1.5 Today: Liver enzymes better but no improvement in Tbili.overnight.. No SBP on diagnostic paracentesis.  AFP mildly elevated at 7.3.   Macrocytic anemia likely related to folate deficiency and BM suppression.   Right bowel wall thickening on CT scan - probably portal colopathy.    Cholelithiasis.  No CT evidence of acute cholecystitis.   Principal Problem:   Alcoholic cirrhosis of liver with ascites (HCC) Active Problems:   Alcohol withdrawal (HCC)   Alcoholic hepatitis with ascites   Alcoholic gastritis   Severe thrombocytopenia (HCC)   Electrolyte abnormality   Macrocytic anemia   Tobacco abuse   PLAN   --Could possibly benefit from steroids for alcoholic hepatitis.  However concerned about medication/office follow-up compliance as steroids will increase his risk for infection. He lives in [place]. Currently unemployed. He relies on stepmother and friends for transportation --Getting platelet transfusion ( platelets 18) --Continue vitamin  B1 --Continue folate --Continue 2 g sodium restricted diet. He will need instructions / education prior to discharge --Continue twice daily lactulose --Continue empiric Rocephin 1 g daily ( no SBP but still at risk for infections with decompensated cirrhosis) --Await fluid cytology.  --MRI already ordered given  AFP elevation --Probably will start low dose diuretics soon.  --Eventual EGD for varicess screening --Will need full hepatic serologic evaluation as outpatient to evaluate for any additional causes of cirrhosis. For evaluation of + HCV ab an viral count is pending. If HCV detected then will need referral to ID for treatment.  --Total Etoh abstinence going forward.   Subjective   No complaints. Woke him from sleep. Had a BM today.   Objective     Recent Labs    03/01/24 1450 03/02/24 0417 03/03/24 0439  WBC 4.0 4.4 4.2  HGB 10.3* 9.3* 8.4*  HCT 28.8* 26.6* 23.6*  PLT 20* 23* 18*   BMET Recent Labs    03/01/24 2233 03/02/24 0417 03/03/24 0439  NA 137 132* 132*  K 4.1 5.0 3.9  CL 94* 95* 97*  CO2 26 23 26   GLUCOSE 85 90 83  BUN 5* <5* 6  CREATININE 0.79 0.87 0.74  CALCIUM 8.4* 8.1* 8.0*   LFT Recent Labs    03/03/24 0439  PROT 5.3*  ALBUMIN 2.3*  AST 138*  ALT 42  ALKPHOS 60  BILITOT 6.9*   PT/INR Recent Labs    03/02/24 0417 03/03/24 0839  LABPROT 23.7* 28.5*  INR 2.1* 2.6*     Imaging:  IR Paracentesis INDICATION: 38 year old male with history of alcoholic cirrhosis presents with abdominal distention, ascites. Request made for diagnostic paracentesis.  EXAM: ULTRASOUND GUIDED DIAGNOSTIC PARACENTESIS  MEDICATIONS: 10 mL 1% lidocaine  COMPLICATIONS: None immediate.  PROCEDURE: Informed written consent was obtained from the patient after a discussion of the risks, benefits and alternatives to treatment. A timeout was performed prior to the initiation of the  procedure.  Initial ultrasound scanning demonstrates a small amount of ascites within the right lower abdominal quadrant. The right lower abdomen was prepped and draped in the usual sterile fashion. 1% lidocaine was used for local anesthesia.  Following this, a 19 gauge, 7-cm, Yueh catheter was introduced. An ultrasound image was saved for documentation purposes. The paracentesis  was performed. The catheter was removed and a dressing was applied. The patient tolerated the procedure well without immediate post procedural complication.  FINDINGS: A total of approximately 1.0 liters of yellow fluid was removed. Samples were sent to the laboratory as requested by the clinical team.  IMPRESSION: Successful ultrasound-guided paracentesis yielding 1.0 liters of peritoneal fluid.  Electronically Signed   By: Gregory Murphy M.D.   On: 03/02/2024 16:01     Scheduled inpatient medications:   docusate sodium  100 mg Oral BID   folic acid  1 mg Oral Daily   lactulose  10 g Oral BID   multivitamin with minerals  1 tablet Oral Daily   thiamine  100 mg Oral QHS   Or   thiamine  100 mg Intravenous QHS   Continuous inpatient infusions:   cefTRIAXone (ROCEPHIN)  IV 2 g (03/03/24 1017)   PRN inpatient medications: albuterol, LORazepam **OR** LORazepam, ondansetron **OR** ondansetron (ZOFRAN) IV, traMADol  Vital signs in last 24 hours: Temp:  [98.6 F (37 C)-99.2 F (37.3 C)] 98.6 F (37 C) (03/18 1145) Pulse Rate:  [91-120] 94 (03/18 1145) Resp:  [12-27] 12 (03/18 1145) BP: (113-128)/(72-88) 119/86 (03/18 1145) SpO2:  [93 %-98 %] 98 % (03/18 1145)    Intake/Output Summary (Last 24 hours) at 03/03/2024 1309 Last data filed at 03/03/2024 0500 Gross per 24 hour  Intake 99.47 ml  Output --  Net 99.47 ml    Intake/Output from previous day: 03/17 0701 - 03/18 0700 In: 99.5 [IV Piggyback:99.5] Out: -  Intake/Output this shift: No intake/output data recorded.   Physical Exam:  General: Awoke from slelep Heart:  Regular rate and rhythm.  Pulmonary: Normal respiratory effort Abdomen: Soft, mild-moderate distention. Nontender. Normal bowel sounds. Neurologic: Alert and oriented. No asterixis Psych: Pleasant. Cooperative    LOS: 2 days   Gregory Murphy ,NP 03/03/2024, 1:09 PM

## 2024-03-04 ENCOUNTER — Inpatient Hospital Stay (HOSPITAL_COMMUNITY): Payer: MEDICAID

## 2024-03-04 ENCOUNTER — Encounter: Payer: Self-pay | Admitting: Gastroenterology

## 2024-03-04 LAB — BPAM PLATELET PHERESIS
Blood Product Expiration Date: 202503202359
Blood Product Expiration Date: 202503202359
ISSUE DATE / TIME: 202503181113
ISSUE DATE / TIME: 202503181113
Unit Type and Rh: 5100
Unit Type and Rh: 6200

## 2024-03-04 LAB — CBC
HCT: 22.8 % — ABNORMAL LOW (ref 39.0–52.0)
HCT: 23.4 % — ABNORMAL LOW (ref 39.0–52.0)
Hemoglobin: 8.2 g/dL — ABNORMAL LOW (ref 13.0–17.0)
Hemoglobin: 8.4 g/dL — ABNORMAL LOW (ref 13.0–17.0)
MCH: 39.4 pg — ABNORMAL HIGH (ref 26.0–34.0)
MCH: 39.1 pg — ABNORMAL HIGH (ref 26.0–34.0)
MCHC: 36 g/dL (ref 30.0–36.0)
MCHC: 35.9 g/dL (ref 30.0–36.0)
MCV: 109.6 fL — ABNORMAL HIGH (ref 80.0–100.0)
MCV: 108.8 fL — ABNORMAL HIGH (ref 80.0–100.0)
Platelets: 54 10*3/uL — ABNORMAL LOW (ref 150–400)
Platelets: 54 10*3/uL — ABNORMAL LOW (ref 150–400)
RBC: 2.08 MIL/uL — ABNORMAL LOW (ref 4.22–5.81)
RBC: 2.15 MIL/uL — ABNORMAL LOW (ref 4.22–5.81)
RDW: 14.6 % (ref 11.5–15.5)
RDW: 14.6 % (ref 11.5–15.5)
WBC: 4.2 10*3/uL (ref 4.0–10.5)
WBC: 4 10*3/uL (ref 4.0–10.5)
nRBC: 0 % (ref 0.0–0.2)
nRBC: 0 % (ref 0.0–0.2)

## 2024-03-04 LAB — PREPARE PLATELET PHERESIS
Unit division: 0
Unit division: 0

## 2024-03-04 LAB — COMPREHENSIVE METABOLIC PANEL
ALT: 42 U/L (ref 0–44)
AST: 134 U/L — ABNORMAL HIGH (ref 15–41)
Albumin: 2.3 g/dL — ABNORMAL LOW (ref 3.5–5.0)
Alkaline Phosphatase: 61 U/L (ref 38–126)
Anion gap: 8 (ref 5–15)
BUN: 6 mg/dL (ref 6–20)
CO2: 28 mmol/L (ref 22–32)
Calcium: 8.1 mg/dL — ABNORMAL LOW (ref 8.9–10.3)
Chloride: 96 mmol/L — ABNORMAL LOW (ref 98–111)
Creatinine, Ser: 0.74 mg/dL (ref 0.61–1.24)
GFR, Estimated: >60 mL/min (ref >60)
Glucose, Bld: 91 mg/dL (ref 70–99)
Potassium: 3.6 mmol/L (ref 3.5–5.1)
Sodium: 132 mmol/L — ABNORMAL LOW (ref 135–145)
Total Bilirubin: 6.1 mg/dL — ABNORMAL HIGH (ref 0.0–1.2)
Total Protein: 5.4 g/dL — ABNORMAL LOW (ref 6.5–8.1)

## 2024-03-04 LAB — CYTOLOGY - NON PAP

## 2024-03-04 MED ORDER — METOPROLOL TARTRATE 5 MG/5ML IV SOLN: 5.0000 mg | INTRAVENOUS | Status: DC | PRN

## 2024-03-04 MED ORDER — SENNOSIDES-DOCUSATE SODIUM 8.6-50 MG PO TABS: 1.0000 | ORAL_TABLET | Freq: Every evening | ORAL | Status: DC | PRN

## 2024-03-04 MED ORDER — GUAIFENESIN 100 MG/5ML PO LIQD: 5.0000 mL | ORAL | Status: DC | PRN

## 2024-03-04 MED ORDER — CHLORDIAZEPOXIDE HCL 5 MG PO CAPS
25.0000 mg | ORAL_CAPSULE | Freq: Three times a day (TID) | ORAL | Status: AC
  Administered 2024-03-04 (×3): 25 mg via ORAL
  Filled 2024-03-04 (×3): qty 5

## 2024-03-04 MED ORDER — GADOBUTROL 1 MMOL/ML IV SOLN
5.0000 mL | Freq: Once | INTRAVENOUS | Status: AC | PRN
  Administered 2024-03-04: 5 mL via INTRAVENOUS

## 2024-03-04 MED ORDER — CHLORDIAZEPOXIDE HCL 5 MG PO CAPS
25.0000 mg | ORAL_CAPSULE | Freq: Two times a day (BID) | ORAL | Status: DC
  Administered 2024-03-05: 25 mg via ORAL
  Filled 2024-03-04: qty 5

## 2024-03-04 MED ORDER — IPRATROPIUM-ALBUTEROL 0.5-2.5 (3) MG/3ML IN SOLN: 3.0000 mL | RESPIRATORY_TRACT | Status: DC | PRN

## 2024-03-04 MED ORDER — PANTOPRAZOLE SODIUM 40 MG PO TBEC
40.0000 mg | DELAYED_RELEASE_TABLET | Freq: Two times a day (BID) | ORAL | Status: DC
  Administered 2024-03-04 – 2024-03-05 (×3): 40 mg via ORAL
  Filled 2024-03-04 (×3): qty 1

## 2024-03-04 MED ORDER — CHLORDIAZEPOXIDE HCL 5 MG PO CAPS: 25.0000 mg | ORAL_CAPSULE | Freq: Every day | ORAL | Status: DC

## 2024-03-04 MED ORDER — ACETAMINOPHEN 325 MG PO TABS: 650.0000 mg | ORAL_TABLET | Freq: Four times a day (QID) | ORAL | Status: DC | PRN

## 2024-03-04 MED ORDER — HYDRALAZINE HCL 20 MG/ML IJ SOLN: 10.0000 mg | INTRAMUSCULAR | Status: DC | PRN

## 2024-03-04 NOTE — Plan of Care (Signed)

## 2024-03-04 NOTE — TOC Progression Note (Signed)
 Transition of Care [facility]) - Progression Note    Patient Details  Name: Gregory Murphy MRN: [id number] Date of Birth: [birth year]  Transition of Care [facility]) CM/SW Contact  Madline Oesterling Reeves Forth, Student-Social Work Phone Number: 03/04/2024, 4:24 PM  Clinical Narrative:    MSW Intern attempted to complete SA consult but was unable to do so due to pt being asleep.         Expected Discharge Plan and Services                                               Social Determinants of Health (SDOH) Interventions SDOH Screenings   Food Insecurity: No Food Insecurity (03/02/2024)  Housing: High Risk (03/03/2024)  Transportation Needs: No Transportation Needs (03/03/2024)  Utilities: Not At Risk (03/03/2024)  Tobacco Use: High Risk (03/01/2024)    Readmission Risk Interventions     No data to display

## 2024-03-04 NOTE — Progress Notes (Signed)
 Daily Progress Note  DOA: 03/01/2024 Hospital Day: 4   Chief Complaint: Decompensated cirrhosis  ASSESSMENT    38 year old male with history of Etoh abuse and recently diagnosed decompensated cirrhosis . Actively consuming Etoh, may have superimposed Etoh hepatitis.  MELD 3.0: 25 at 03/02/2024  4:17 AM MDF is 46.1 ( poor prognosis) Unable to calculate SAAG with ascitic fluid albumin < 1.5 Today: :--LFTs continue to slowly improve  Mentation normal.  --HCV ab was positive but viral load negative so self eradicated virus.  --AFP mildly elevated at 7.3 but MRI today without any focal liver lesions.   --Platelet count improved from 18 to 54 post platelet transfusion yesterday  Macrocytic anemia likely related to folate deficiency and BM suppression. Hgb stable at 8.4   Right bowel wall thickening on CT scan and MRI - probably portal colopathy.    Cholelithiasis.  No evidence of acute cholecystitis.   Principal Problem:   Alcoholic cirrhosis of liver with ascites (HCC) Active Problems:   Alcohol withdrawal (HCC)   Alcoholic hepatitis with ascites   Alcoholic gastritis   Severe thrombocytopenia (HCC)   Electrolyte abnormality   Macrocytic anemia   Tobacco abuse   HCV antibody positive   Elevated AFP   PLAN   --Continue vitamin  B1 --Continue folate --Continue 2 g sodium restricted diet. He will need instructions / education prior to discharge --Starting Lasix 20 mg daily / aldactone 50 mg daily. Renal function is normal. .  --Continue twice daily lactulose --Continue empiric antibiotics x 5 days ( no SBP but still at risk for infections with decompensated cirrhosis). On day #3 --Await fluid cytology.  --Eventual outpatient EGD for varicess screening --Will need full hepatic serologic evaluation as outpatient to evaluate for any additional causes of cirrhosis.  --Total Etoh abstinence going forward.  --He is actively working with someone to get  Medicaid  Subjective   No complaints.   Objective    Recent Labs    03/03/24 0439 03/04/24 0453 03/04/24 0937  WBC 4.2 4.2 4.0  HGB 8.4* 8.2* 8.4*  HCT 23.6* 22.8* 23.4*  PLT 18* 54* 54*   BMET Recent Labs    03/02/24 0417 03/03/24 0439 03/04/24 0453  NA 132* 132* 132*  K 5.0 3.9 3.6  CL 95* 97* 96*  CO2 23 26 28   GLUCOSE 90 83 91  BUN <5* 6 6  CREATININE 0.87 0.74 0.74  CALCIUM 8.1* 8.0* 8.1*   LFT Recent Labs    03/04/24 0453  PROT 5.4*  ALBUMIN 2.3*  AST 134*  ALT 42  ALKPHOS 61  BILITOT 6.1*   PT/INR Recent Labs    03/02/24 0417 03/03/24 0839  LABPROT 23.7* 28.5*  INR 2.1* 2.6*     Imaging:  MR LIVER W WO CONTRAST CLINICAL DATA:  Unintended weight loss, cirrhosis  EXAM: MRI ABDOMEN WITHOUT AND WITH CONTRAST  TECHNIQUE: Multiplanar multisequence MR imaging of the abdomen was performed both before and after the administration of intravenous contrast.  CONTRAST:  5mL GADAVIST GADOBUTROL 1 MMOL/ML IV SOLN  COMPARISON:  CT abdomen pelvis, 03/01/2024  FINDINGS: Lower chest: Trace pleural effusions.  Hepatobiliary: Coarse, nodular cirrhotic morphology of the liver. Profound hepatic steatosis. Distended gallbladder containing tiny gallstones. Gallbladder wall thickening and pericholecystic fluid, nonspecific in the setting of ascites. No biliary ductal dilatation.  Pancreas: Unremarkable. No pancreatic ductal dilatation or surrounding inflammatory changes.  Spleen: Normal in size without significant abnormality.  Adrenals/Urinary Tract: Adrenal glands are unremarkable. Kidneys are  normal, without renal calculi, solid lesion, or hydronephrosis.  Stomach/Bowel: Stomach is within normal limits. Thickening of the cecum and ascending colon, likely reflecting portal colopathy.  Vascular/Lymphatic: Aortic atherosclerosis. Small gastroesophageal varices. No enlarged abdominal lymph nodes.  Other: No abdominal wall hernia or abnormality.  Moderate volume ascites throughout the abdomen.  Musculoskeletal: No acute or significant osseous findings.  IMPRESSION: 1. Cirrhosis and profound hepatic steatosis. No suspicious liver lesion. 2. Moderate volume ascites throughout the abdomen. 3. Small gastroesophageal varices. 4. Thickening of the cecum and ascending colon, likely reflecting portal colopathy. 5. Distended gallbladder containing tiny gallstones. Gallbladder wall thickening and pericholecystic fluid, nonspecific in the setting of ascites. 6. Trace pleural effusions.  Electronically Signed   By: Jearld Lesch M.D.   On: 03/04/2024 09:30     Scheduled inpatient medications:   chlordiazePOXIDE  25 mg Oral TID   Followed by   Melene Muller ON 03/05/2024] chlordiazePOXIDE  25 mg Oral BID   Followed by   Melene Muller ON 03/06/2024] chlordiazePOXIDE  25 mg Oral Q1500   docusate sodium  100 mg Oral BID   folic acid  1 mg Oral Daily   lactulose  10 g Oral BID   multivitamin with minerals  1 tablet Oral Daily   pantoprazole  40 mg Oral BID AC   thiamine  100 mg Oral QHS   Continuous inpatient infusions:   cefTRIAXone (ROCEPHIN)  IV 2 g (03/04/24 0916)   PRN inpatient medications: acetaminophen, guaiFENesin, hydrALAZINE, ipratropium-albuterol, LORazepam **OR** LORazepam, metoprolol tartrate, ondansetron **OR** ondansetron (ZOFRAN) IV, senna-docusate, traMADol  Vital signs in last 24 hours: Temp:  [98.2 F (36.8 C)-98.4 F (36.9 C)] 98.2 F (36.8 C) (03/19 0306) Pulse Rate:  [89-103] 89 (03/18 2356) Resp:  [16-18] 16 (03/18 2356) BP: (115-123)/(73-90) 115/73 (03/18 2356) SpO2:  [96 %-98 %] 98 % (03/18 2356)    Intake/Output Summary (Last 24 hours) at 03/04/2024 1405 Last data filed at 03/03/2024 1600 Gross per 24 hour  Intake 450 ml  Output --  Net 450 ml    Intake/Output from previous day: 03/18 0701 - 03/19 0700 In: 1000 [Blood:1000] Out: -  Intake/Output this shift: No intake/output data recorded.   Physical  Exam:  General: Awoke from slelep Heart:  Regular rate and rhythm.  Pulmonary: Normal respiratory effort Abdomen: Soft, mild-moderate distention. Nontender. Normal bowel sounds. Neurologic: Alert and oriented. No asterixis Psych: Pleasant. Cooperative    LOS: 3 days   Willette Cluster ,NP 03/04/2024, 2:05 PM

## 2024-03-04 NOTE — Progress Notes (Signed)
 PROGRESS NOTE    Gregory Murphy  UEA:[id number] DOB: 08/22/1986 DOA: 03/01/2024 PCP: Patient, [name]    Brief Narrative:  38 year old with history of alcohol abuse, TBI, depression, panic attack comes to the ED with complaints of abdominal pain, nausea and vomiting.  Last drink was prior to admission.  GI team was consulted.  There is signs of decompensated liver cirrhosis.  Due to high risk of infection, currently favoring conservative management versus steroid use.   Assessment & Plan:  Principal Problem:   Alcoholic cirrhosis of liver with ascites (HCC) Active Problems:   Alcoholic hepatitis with ascites   Alcohol withdrawal (HCC)   Alcoholic gastritis   Severe thrombocytopenia (HCC)   Electrolyte abnormality   Macrocytic anemia   Tobacco abuse   HCV antibody positive   Elevated AFP    Alcoholic cirrhosis of liver with ascites (HCC) Alcoholi hepatitis Unfortunately patient has poor prognosis.  Holding off on steroid use due to high risk for infection.  Continue multivitamins, low-salt, empiric Rocephin.  Needs to take lactulose to have 2-3 bowel movements daily.  Eventually will need endoscopic evaluation for screening purposes - Low-dose diuretics as blood pressure tolerate - MRI liver protocol for elevated AFP -Status post paracentesis 3/17: 1 L removed.  No evidence of SBP.  Cytology pending  Alcohol withdrawal (HCC) Alcohol withdrawal protocol.  Librium taper.  High risk of withdrawals   Tobacco abuse Patient want to smoke cigarette and denies nicotine patch or gum. -Counseling was provided   Macrocytic anemia Continue supplements   Electrolyte abnormality As needed repletion   Severe thrombocytopenia (HCC) Platelet with slight worsening to 18 and hemoglobin decreased to 8.4.  After platelet transfusion, hemoglobin stable   Alcoholic gastritis No active bleeding. I will add PPI   DVT prophylaxis: Place TED hose Start: 03/01/24 2150    Code  Status: Full Code Family Communication:   Status is: Inpatient Remains inpatient appropriate because: Cont hosp stay, still tachy    Subjective: Sinus tachycardia while I m at bedside.  Coworkers are at bedside as well.  Patient did give me permission to speak openly regarding his medical care  Patient continues to state he wants to leave the hospital.  I explained to him risks and benefits regarding leaving AMA.  Examination:  General exam: Appears calm and comfortable  Respiratory system: Clear to auscultation. Respiratory effort normal. Cardiovascular system: S1 & S2 heard, RRR. No JVD, murmurs, rubs, gallops or clicks. No pedal edema. Gastrointestinal system: Abdomen is nondistended, soft and nontender. No organomegaly or masses felt. Normal bowel sounds heard. Central nervous system: Alert and oriented. No focal neurological deficits. Extremities: Symmetric 5 x 5 power. Skin: No rashes, lesions or ulcers Psychiatry: Judgement and insight appear normal. Mood & affect appropriate.                Diet Orders (From admission, onward)     Start     Ordered   03/02/24 0903  Diet 2 gram sodium Room service appropriate? Yes; Fluid consistency: Thin  Diet effective now       Question Answer Comment  Room service appropriate? Yes   Fluid consistency: Thin      03/02/24 0903            Objective: Vitals:   03/03/24 1345 03/03/24 1947 03/03/24 2356 03/04/24 0306  BP: 120/82 (!) 123/90 115/73   Pulse: 97 (!) 103 89   Resp: 12 18 16    Temp: 98.5 F (36.9 C)  98.4 F (36.9 C) 98.4 F (36.9 C) 98.2 F (36.8 C)  TempSrc: Oral Oral Oral Oral  SpO2:  96% 98%   Weight:      Height:        Intake/Output Summary (Last 24 hours) at 03/04/2024 1354 Last data filed at 03/03/2024 1600 Gross per 24 hour  Intake 450 ml  Output --  Net 450 ml   Filed Weights   03/01/24 2304  Weight: 50 kg    Scheduled Meds:  chlordiazePOXIDE  25 mg Oral TID   Followed by    Melene Muller ON 03/05/2024] chlordiazePOXIDE  25 mg Oral BID   Followed by   Melene Muller ON 03/06/2024] chlordiazePOXIDE  25 mg Oral Q1500   docusate sodium  100 mg Oral BID   folic acid  1 mg Oral Daily   lactulose  10 g Oral BID   multivitamin with minerals  1 tablet Oral Daily   pantoprazole  40 mg Oral BID AC   thiamine  100 mg Oral QHS   Continuous Infusions:  cefTRIAXone (ROCEPHIN)  IV 2 g (03/04/24 0916)    Nutritional status     Body mass index is 20.83 kg/m.  Data Reviewed:   CBC: Recent Labs  Lab 03/01/24 1450 03/02/24 0417 03/03/24 0439 03/04/24 0453 03/04/24 0937  WBC 4.0 4.4 4.2 4.2 4.0  NEUTROABS 2.6  --   --   --   --   HGB 10.3* 9.3* 8.4* 8.2* 8.4*  HCT 28.8* 26.6* 23.6* 22.8* 23.4*  MCV 107.9* 111.3* 108.3* 109.6* 108.8*  PLT 20* 23* 18* 54* 54*   Basic Metabolic Panel: Recent Labs  Lab 03/01/24 1450 03/01/24 1730 03/01/24 2233 03/02/24 0417 03/03/24 0439 03/04/24 0453  NA 134*  --  137 132* 132* 132*  K 2.8*  --  4.1 5.0 3.9 3.6  CL 91*  --  94* 95* 97* 96*  CO2 26  --  26 23 26 28   GLUCOSE 116*  --  85 90 83 91  BUN 6  --  5* <5* 6 6  CREATININE 0.94  --  0.79 0.87 0.74 0.74  CALCIUM 8.3*  --  8.4* 8.1* 8.0* 8.1*  MG  --  1.6* 1.4*  --   --   --    GFR: Estimated Creatinine Clearance: 88.5 mL/min (by C-G formula based on SCr of 0.74 mg/dL). Liver Function Tests: Recent Labs  Lab 03/01/24 1450 03/02/24 0417 03/03/24 0439 03/04/24 0453  AST 176* 184* 138* 134*  ALT 48* 49* 42 42  ALKPHOS 76 67 60 61  BILITOT 7.0* 7.0* 6.9* 6.1*  PROT 6.4* 5.8* 5.3* 5.4*  ALBUMIN 2.8* 2.5* 2.3* 2.3*   Recent Labs  Lab 03/01/24 1450  LIPASE 72*   Recent Labs  Lab 03/01/24 1730  AMMONIA 51*   Coagulation Profile: Recent Labs  Lab 03/01/24 1730 03/02/24 0417 03/03/24 0839  INR 1.9* 2.1* 2.6*   Cardiac Enzymes: No results for input(s): "CKTOTAL", "CKMB", "CKMBINDEX", "TROPONINI" in the last 168 hours. BNP (last 3 results) No results for  input(s): "PROBNP" in the last 8760 hours. HbA1C: No results for input(s): "HGBA1C" in the last 72 hours. CBG: No results for input(s): "GLUCAP" in the last 168 hours. Lipid Profile: No results for input(s): "CHOL", "HDL", "LDLCALC", "TRIG", "CHOLHDL", "LDLDIRECT" in the last 72 hours. Thyroid Function Tests: No results for input(s): "TSH", "T4TOTAL", "FREET4", "T3FREE", "THYROIDAB" in the last 72 hours. Anemia Panel: Recent Labs    03/02/24 1101 03/02/24 1126  VITAMINB12 1,284*  --   FOLATE  --  4.1*   Sepsis Labs: Recent Labs  Lab 03/01/24 2233 03/02/24 0417 03/03/24 2222  LATICACIDVEN 7.8* 5.6* 1.1    Recent Results (from the past 240 hours)  Urine Culture (for pregnant, neutropenic or urologic patients or patients with an indwelling urinary catheter)     Status: Abnormal   Collection Time: 03/01/24  3:19 PM   Specimen: Urine, Clean Catch  Result Value Ref Range Status   Specimen Description URINE, CLEAN CATCH  Final   Special Requests ADDED 0001 03/02/2024  Final   Culture (A)  Final    <10,000 COLONIES/mL INSIGNIFICANT GROWTH Performed at [facility], [address], [place], [place] [address]    Report Status 03/02/2024 FINAL  Final  Culture, body fluid w Gram Stain-bottle     Status: None (Preliminary result)   Collection Time: 03/02/24  1:04 PM   Specimen: Fluid  Result Value Ref Range Status   Specimen Description FLUID PERITONEAL  Final   Special Requests BOTTLES DRAWN AEROBIC AND ANAEROBIC  Final   Culture   Final    NO GROWTH 2 DAYS Performed at [facility], [address], [place], [place] [address]    Report Status PENDING  Incomplete  Gram stain     Status: None   Collection Time: 03/02/24  1:04 PM   Specimen: Fluid  Result Value Ref Range Status   Specimen Description FLUID PERITONEAL  Final   Special Requests NONE  Final   Gram Stain   Final    WBC PRESENT, PREDOMINANTLY MONONUCLEAR NO ORGANISMS SEEN CYTOSPIN SMEAR Performed at  [facility], [address], [place], [place] [address]    Report Status 03/02/2024 FINAL  Final         Radiology Studies: MR LIVER W WO CONTRAST Result Date: 03/04/2024 CLINICAL DATA:  Unintended weight loss, cirrhosis EXAM: MRI ABDOMEN WITHOUT AND WITH CONTRAST TECHNIQUE: Multiplanar multisequence MR imaging of the abdomen was performed both before and after the administration of intravenous contrast. CONTRAST:  5mL GADAVIST GADOBUTROL 1 MMOL/ML IV SOLN COMPARISON:  CT abdomen pelvis, 03/01/2024 FINDINGS: Lower chest: Trace pleural effusions. Hepatobiliary: Coarse, nodular cirrhotic morphology of the liver. Profound hepatic steatosis. Distended gallbladder containing tiny gallstones. Gallbladder wall thickening and pericholecystic fluid, nonspecific in the setting of ascites. No biliary ductal dilatation. Pancreas: Unremarkable. No pancreatic ductal dilatation or surrounding inflammatory changes. Spleen: Normal in size without significant abnormality. Adrenals/Urinary Tract: Adrenal glands are unremarkable. Kidneys are normal, without renal calculi, solid lesion, or hydronephrosis. Stomach/Bowel: Stomach is within normal limits. Thickening of the cecum and ascending colon, likely reflecting portal colopathy. Vascular/Lymphatic: Aortic atherosclerosis. Small gastroesophageal varices. No enlarged abdominal lymph nodes. Other: No abdominal wall hernia or abnormality. Moderate volume ascites throughout the abdomen. Musculoskeletal: No acute or significant osseous findings. IMPRESSION: 1. Cirrhosis and profound hepatic steatosis. No suspicious liver lesion. 2. Moderate volume ascites throughout the abdomen. 3. Small gastroesophageal varices. 4. Thickening of the cecum and ascending colon, likely reflecting portal colopathy. 5. Distended gallbladder containing tiny gallstones. Gallbladder wall thickening and pericholecystic fluid, nonspecific in the setting of ascites. 6. Trace pleural effusions.  Electronically Signed   By: Jearld Lesch M.D.   On: 03/04/2024 09:30           LOS: 3 days   Time spent= 35 mins    Miguel Rota, MD [facility]  If 7PM-7AM, please contact night-coverage  03/04/2024, 1:54 PM

## 2024-03-05 LAB — COMPREHENSIVE METABOLIC PANEL
ALT: 46 U/L — ABNORMAL HIGH (ref 0–44)
AST: 136 U/L — ABNORMAL HIGH (ref 15–41)
Albumin: 2.2 g/dL — ABNORMAL LOW (ref 3.5–5.0)
Alkaline Phosphatase: 66 U/L (ref 38–126)
Anion gap: 9 (ref 5–15)
BUN: 8 mg/dL (ref 6–20)
CO2: 25 mmol/L (ref 22–32)
Calcium: 8.1 mg/dL — ABNORMAL LOW (ref 8.9–10.3)
Chloride: 98 mmol/L (ref 98–111)
Creatinine, Ser: 0.82 mg/dL (ref 0.61–1.24)
GFR, Estimated: >60 mL/min (ref >60)
Glucose, Bld: 81 mg/dL (ref 70–99)
Potassium: 3.7 mmol/L (ref 3.5–5.1)
Sodium: 132 mmol/L — ABNORMAL LOW (ref 135–145)
Total Bilirubin: 5 mg/dL — ABNORMAL HIGH (ref 0.0–1.2)
Total Protein: 5.2 g/dL — ABNORMAL LOW (ref 6.5–8.1)

## 2024-03-05 LAB — MAGNESIUM: Magnesium: 1.7 mg/dL (ref 1.7–2.4)

## 2024-03-05 LAB — CBC
HCT: 22.9 % — ABNORMAL LOW (ref 39.0–52.0)
Hemoglobin: 8 g/dL — ABNORMAL LOW (ref 13.0–17.0)
MCH: 38.1 pg — ABNORMAL HIGH (ref 26.0–34.0)
MCHC: 34.9 g/dL (ref 30.0–36.0)
MCV: 109 fL — ABNORMAL HIGH (ref 80.0–100.0)
Platelets: 44 10*3/uL — ABNORMAL LOW (ref 150–400)
RBC: 2.1 MIL/uL — ABNORMAL LOW (ref 4.22–5.81)
RDW: 14.6 % (ref 11.5–15.5)
WBC: 3.9 10*3/uL — ABNORMAL LOW (ref 4.0–10.5)
nRBC: 0 % (ref 0.0–0.2)

## 2024-03-05 NOTE — Discharge Summary (Signed)
 Physician Discharge Summary  Acea Yagi Pantano ZOX:[id number] DOB: 1986-09-19 DOA: 03/01/2024  PCP: Patient, [name]  Admit date: 03/01/2024 Discharge date: 03/05/2024  Admitted From: Home Disposition: Left AMA  Recommendations for Outpatient Follow-up:  Left AMA    Discharge Condition: Stable   Brief/Interim Summary: Brief Narrative:  38 year old with history of alcohol abuse, TBI, depression, panic attack comes to the ED with complaints of abdominal pain, nausea and vomiting.  Last drink was prior to admission.  GI team was consulted.  There is signs of decompensated liver cirrhosis.  Due to high risk of infection, currently favoring conservative management versus steroid use.  Despite of my long discussion with him regarding risks and benefit of his staying in the hospital patient decided to leave AGAINST MEDICAL ADVICE.  I was clear to him regarding his advanced cirrhosis and alcohol withdrawal which can lead to death.  Patient's friends were also at bedside during my discussion with him.   Assessment & Plan:  Principal Problem:   Alcoholic cirrhosis of liver with ascites (HCC) Active Problems:   Alcoholic hepatitis with ascites   Alcohol withdrawal (HCC)   Alcoholic gastritis   Severe thrombocytopenia (HCC)   Electrolyte abnormality   Macrocytic anemia   Tobacco abuse   HCV antibody positive   Elevated AFP    Alcoholic cirrhosis of liver with ascites (HCC) Alcoholi hepatitis Unfortunately patient has poor prognosis.  Holding off on steroid use due to high risk for infection.  Continue multivitamins, low-salt, empiric Rocephin.  Needs to take lactulose to have 2-3 bowel movements daily.  Eventually will need endoscopic evaluation for screening purposes - Low-dose diuretics as blood pressure tolerate - Elevated AFP, MRI shows cirrhosis/steatosis but no focal lesions -Status post paracentesis 3/17: 1 L removed.  No evidence of SBP.  Cytology pending  Alcohol  withdrawal (HCC) He no longer is showed signs of severe alcohol withdrawal while on Librium taper.  He is leaving AMA today   Tobacco abuse Patient want to smoke cigarette and denies nicotine patch or gum. -Counseling was provided   Macrocytic anemia Continue supplements   Electrolyte abnormality As needed repletion   Severe thrombocytopenia (HCC) Secondary to alcohol use but did receive platelet transfusion   Alcoholic gastritis PPI  High risk of leaving AMA  DVT prophylaxis: Place TED hose Start: 03/01/24 2150    Code Status: Full Code Family Communication: Does not want me to call anyone Status is: Inpatient Remains inpatient appropriate because: Patient left AMA  Subjective: This morning patient tells me that he has awake to attend to this morning as his mother had called him that one of his close friends had passed away.  I explained to him that his medical condition requires close monitoring and attention.  He is at high risk of undergoing alcohol withdrawal, worsening liver failure and even death.  He continues to insist he is going to leave and does not want me to call any of his family members including his mother.  Eventually patient of leaving AGAINST MEDICAL ADVICE.  Examination:  General exam: Appears calm and comfortable  Respiratory system: Clear to auscultation. Respiratory effort normal. Cardiovascular system: S1 & S2 heard, RRR. No JVD, murmurs, rubs, gallops or clicks. No pedal edema. Gastrointestinal system: Abdomen is nondistended, soft and nontender. No organomegaly or masses felt. Normal bowel sounds heard. Central nervous system: Alert and oriented. No focal neurological deficits. Extremities: Symmetric 5 x 5 power. Skin: No rashes, lesions or ulcers Psychiatry: Judgement and insight appear  normal. Mood & affect appropriate.    Discharge Diagnoses:  Principal Problem:   Alcoholic cirrhosis of liver with ascites (HCC) Active Problems:   Alcoholic  hepatitis with ascites   Alcohol withdrawal (HCC)   Alcoholic gastritis   Severe thrombocytopenia (HCC)   Electrolyte abnormality   Macrocytic anemia   Tobacco abuse   HCV antibody positive   Elevated AFP      Discharge Exam: Vitals:   03/05/24 0700 03/05/24 0819  BP:  111/82  Pulse:  94  Resp: 17 10  Temp:  98.5 F (36.9 C)  SpO2:  92%   Vitals:   03/05/24 0400 03/05/24 0600 03/05/24 0700 03/05/24 0819  BP:    111/82  Pulse:    94  Resp: 20 17 17 10   Temp:    98.5 F (36.9 C)  TempSrc:    Oral  SpO2:    92%  Weight:      Height:          Discharge Instructions     Follow-up Information      [facility] Follow up.   Why: Please call and scheule a appointment to obtain a primary care doctor they havea  pharamcy and finaical counsellors Contact information: [address] [place] [address] [phone]               No Known Allergies  You were cared for by a hospitalist during your hospital stay. If you have any questions about your discharge medications or the care you received while you were in the hospital after you are discharged, you can call the unit and asked to speak with the hospitalist on call if the hospitalist that took care of you is not available. Once you are discharged, your primary care physician will handle any further medical issues. Please note that no refills for any discharge medications will be authorized once you are discharged, as it is imperative that you return to your primary care physician (or establish a relationship with a primary care physician if you do not have one) for your aftercare needs so that they can reassess your need for medications and monitor your lab values.  You were cared for by a hospitalist during your hospital stay. If you have any questions about your discharge medications or the care you received while you were in the hospital after you are  discharged, you can call the unit and asked to speak with the hospitalist on call if the hospitalist that took care of you is not available. Once you are discharged, your primary care physician will handle any further medical issues. Please note that NO REFILLS for any discharge medications will be authorized once you are discharged, as it is imperative that you return to your primary care physician (or establish a relationship with a primary care physician if you do not have one) for your aftercare needs so that they can reassess your need for medications and monitor your lab values.  Please request your Prim.MD to go over all Hospital Tests and Procedure/Radiological results at the follow up, please get all Hospital records sent to your Prim MD by signing hospital release before you go home.  Get CBC, CMP, 2 view Chest X ray checked  by Primary MD during your next visit or SNF MD in 5-7 days ( we routinely change or add medications that can affect your baseline labs and fluid status, therefore we recommend that you get the  mentioned basic workup next visit with your PCP, your PCP may decide not to get them or add new tests based on their clinical decision)  On your next visit with your primary care physician please Get Medicines reviewed and adjusted.  If you experience worsening of your admission symptoms, develop shortness of breath, life threatening emergency, suicidal or homicidal thoughts you must seek medical attention immediately by calling 911 or calling your MD immediately  if symptoms less severe.  You Must read complete instructions/literature along with all the possible adverse reactions/side effects for all the Medicines you take and that have been prescribed to you. Take any new Medicines after you have completely understood and accpet all the possible adverse reactions/side effects.   Do not drive, operate heavy machinery, perform activities at heights, swimming or participation in water  activities or provide baby sitting services if your were admitted for syncope or siezures until you have seen by Primary MD or a Neurologist and advised to do so again.  Do not drive when taking Pain medications.   Procedures/Studies: MR LIVER W WO CONTRAST Result Date: 03/04/2024 CLINICAL DATA:  Unintended weight loss, cirrhosis EXAM: MRI ABDOMEN WITHOUT AND WITH CONTRAST TECHNIQUE: Multiplanar multisequence MR imaging of the abdomen was performed both before and after the administration of intravenous contrast. CONTRAST:  5mL GADAVIST GADOBUTROL 1 MMOL/ML IV SOLN COMPARISON:  CT abdomen pelvis, 03/01/2024 FINDINGS: Lower chest: Trace pleural effusions. Hepatobiliary: Coarse, nodular cirrhotic morphology of the liver. Profound hepatic steatosis. Distended gallbladder containing tiny gallstones. Gallbladder wall thickening and pericholecystic fluid, nonspecific in the setting of ascites. No biliary ductal dilatation. Pancreas: Unremarkable. No pancreatic ductal dilatation or surrounding inflammatory changes. Spleen: Normal in size without significant abnormality. Adrenals/Urinary Tract: Adrenal glands are unremarkable. Kidneys are normal, without renal calculi, solid lesion, or hydronephrosis. Stomach/Bowel: Stomach is within normal limits. Thickening of the cecum and ascending colon, likely reflecting portal colopathy. Vascular/Lymphatic: Aortic atherosclerosis. Small gastroesophageal varices. No enlarged abdominal lymph nodes. Other: No abdominal wall hernia or abnormality. Moderate volume ascites throughout the abdomen. Musculoskeletal: No acute or significant osseous findings. IMPRESSION: 1. Cirrhosis and profound hepatic steatosis. No suspicious liver lesion. 2. Moderate volume ascites throughout the abdomen. 3. Small gastroesophageal varices. 4. Thickening of the cecum and ascending colon, likely reflecting portal colopathy. 5. Distended gallbladder containing tiny gallstones. Gallbladder wall  thickening and pericholecystic fluid, nonspecific in the setting of ascites. 6. Trace pleural effusions. Electronically Signed   By: Jearld Lesch M.D.   On: 03/04/2024 09:30   IR Paracentesis Result Date: 03/02/2024 INDICATION: 38 year old male with history of alcoholic cirrhosis presents with abdominal distention, ascites. Request made for diagnostic paracentesis. EXAM: ULTRASOUND GUIDED DIAGNOSTIC PARACENTESIS MEDICATIONS: 10 mL 1% lidocaine COMPLICATIONS: None immediate. PROCEDURE: Informed written consent was obtained from the patient after a discussion of the risks, benefits and alternatives to treatment. A timeout was performed prior to the initiation of the procedure. Initial ultrasound scanning demonstrates a small amount of ascites within the right lower abdominal quadrant. The right lower abdomen was prepped and draped in the usual sterile fashion. 1% lidocaine was used for local anesthesia. Following this, a 19 gauge, 7-cm, Yueh catheter was introduced. An ultrasound image was saved for documentation purposes. The paracentesis was performed. The catheter was removed and a dressing was applied. The patient tolerated the procedure well without immediate post procedural complication. FINDINGS: A total of approximately 1.0 liters of yellow fluid was removed. Samples were sent to the laboratory as requested by the clinical  team. IMPRESSION: Successful ultrasound-guided paracentesis yielding 1.0 liters of peritoneal fluid. Electronically Signed   By: Corlis Leak M.D.   On: 03/02/2024 16:01   CT ABDOMEN PELVIS W CONTRAST Result Date: 03/01/2024 CLINICAL DATA:  Abdominal pain EXAM: CT ABDOMEN AND PELVIS WITH CONTRAST TECHNIQUE: Multidetector CT imaging of the abdomen and pelvis was performed using the standard protocol following bolus administration of intravenous contrast. RADIATION DOSE REDUCTION: This exam was performed according to the departmental dose-optimization program which includes automated  exposure control, adjustment of the mA and/or kV according to patient size and/or use of iterative reconstruction technique. CONTRAST:  75mL OMNIPAQUE IOHEXOL 350 MG/ML SOLN COMPARISON:  09/09/2023 FINDINGS: Lower chest: No acute findings. Hepatobiliary: Severe diffuse low-density throughout the liver compatible with fatty infiltration. Nodular contours of the liver compatible with cirrhosis. Multiple layering gallstones within the gallbladder. No biliary ductal dilatation. Pancreas: No focal abnormality or ductal dilatation. Spleen: No focal abnormality.  Normal size. Adrenals/Urinary Tract: No adrenal abnormality. No focal renal abnormality. No stones or hydronephrosis. Urinary bladder is unremarkable. Stomach/Bowel: Wall thickening within the right colon felt to reflect portal colopathy. Stomach and small bowel decompressed. No bowel obstruction. Vascular/Lymphatic: Aortic atherosclerosis. No evidence of aneurysm or adenopathy. Distal esophageal varices noted compatible with portal venous hypertension. Reproductive: No visible focal abnormality. Other: Moderate to large volume ascites in the abdomen or pelvis. Musculoskeletal: No acute bony abnormality. IMPRESSION: Severe hepatic steatosis with cirrhosis. Associated distal esophageal varices and moderate to large volume ascites. Cholelithiasis.  No CT evidence of acute cholecystitis. Electronically Signed   By: Charlett Nose M.D.   On: 03/01/2024 19:10   XR Finger Thumb Left Result Date: 02/27/2024 X-rays of the left thumb demonstrate distal phalanx fracture with appropriate placement of the pin across the fracture site, and extend across the IP joint with appropriate positioning within the proximal phalanx.  XR Finger Thumb Left Result Date: 02/18/2024 X-rays of the left thumb were obtained X-rays demonstrate stable appearance of the distal phalanx fracture with pin well located across the IP joint.  Fracture comminution is visualized.   DG MINI C-ARM  IMAGE ONLY Result Date: 02/04/2024 There is no interpretation for this exam.  This order is for images obtained during a surgical procedure.  Please See "Surgeries" Tab for more information regarding the procedure.     The results of significant diagnostics from this hospitalization (including imaging, microbiology, ancillary and laboratory) are listed below for reference.     Microbiology: Recent Results (from the past 240 hours)  Urine Culture (for pregnant, neutropenic or urologic patients or patients with an indwelling urinary catheter)     Status: Abnormal   Collection Time: 03/01/24  3:19 PM   Specimen: Urine, Clean Catch  Result Value Ref Range Status   Specimen Description URINE, CLEAN CATCH  Final   Special Requests ADDED 0001 03/02/2024  Final   Culture (A)  Final    <10,000 COLONIES/mL INSIGNIFICANT GROWTH Performed at [facility], [address], [place], [place] [address]    Report Status 03/02/2024 FINAL  Final  Culture, body fluid w Gram Stain-bottle     Status: None (Preliminary result)   Collection Time: 03/02/24  1:04 PM   Specimen: Fluid  Result Value Ref Range Status   Specimen Description FLUID PERITONEAL  Final   Special Requests BOTTLES DRAWN AEROBIC AND ANAEROBIC  Final   Culture   Final    NO GROWTH 3 DAYS Performed at [facility], [address],  [place], [place] [address]    Report Status PENDING  Incomplete  Gram stain     Status: None   Collection Time: 03/02/24  1:04 PM   Specimen: Fluid  Result Value Ref Range Status   Specimen Description FLUID PERITONEAL  Final   Special Requests NONE  Final   Gram Stain   Final    WBC PRESENT, PREDOMINANTLY MONONUCLEAR NO ORGANISMS SEEN CYTOSPIN SMEAR Performed at [facility], [address], [place], [place] [address]    Report Status 03/02/2024 FINAL  Final     Labs: BNP (last 3 results) No results for input(s): "BNP" in the last 8760 hours. Basic Metabolic Panel: Recent Labs   Lab 03/01/24 1730 03/01/24 2233 03/02/24 0417 03/03/24 0439 03/04/24 0453 03/05/24 0519  NA  --  137 132* 132* 132* 132*  K  --  4.1 5.0 3.9 3.6 3.7  CL  --  94* 95* 97* 96* 98  CO2  --  26 23 26 28 25   GLUCOSE  --  85 90 83 91 81  BUN  --  5* <5* 6 6 8   CREATININE  --  0.79 0.87 0.74 0.74 0.82  CALCIUM  --  8.4* 8.1* 8.0* 8.1* 8.1*  MG 1.6* 1.4*  --   --   --  1.7   Liver Function Tests: Recent Labs  Lab 03/01/24 1450 03/02/24 0417 03/03/24 0439 03/04/24 0453 03/05/24 0519  AST 176* 184* 138* 134* 136*  ALT 48* 49* 42 42 46*  ALKPHOS 76 67 60 61 66  BILITOT 7.0* 7.0* 6.9* 6.1* 5.0*  PROT 6.4* 5.8* 5.3* 5.4* 5.2*  ALBUMIN 2.8* 2.5* 2.3* 2.3* 2.2*   Recent Labs  Lab 03/01/24 1450  LIPASE 72*   Recent Labs  Lab 03/01/24 1730  AMMONIA 51*   CBC: Recent Labs  Lab 03/01/24 1450 03/02/24 0417 03/03/24 0439 03/04/24 0453 03/04/24 0937 03/05/24 0519  WBC 4.0 4.4 4.2 4.2 4.0 3.9*  NEUTROABS 2.6  --   --   --   --   --   HGB 10.3* 9.3* 8.4* 8.2* 8.4* 8.0*  HCT 28.8* 26.6* 23.6* 22.8* 23.4* 22.9*  MCV 107.9* 111.3* 108.3* 109.6* 108.8* 109.0*  PLT 20* 23* 18* 54* 54* 44*   Cardiac Enzymes: No results for input(s): "CKTOTAL", "CKMB", "CKMBINDEX", "TROPONINI" in the last 168 hours. BNP: Invalid input(s): "POCBNP" CBG: No results for input(s): "GLUCAP" in the last 168 hours. D-Dimer No results for input(s): "DDIMER" in the last 72 hours. Hgb A1c No results for input(s): "HGBA1C" in the last 72 hours. Lipid Profile No results for input(s): "CHOL", "HDL", "LDLCALC", "TRIG", "CHOLHDL", "LDLDIRECT" in the last 72 hours. Thyroid function studies No results for input(s): "TSH", "T4TOTAL", "T3FREE", "THYROIDAB" in the last 72 hours.  Invalid input(s): "FREET3" Anemia work up Recent Labs    03/02/24 1126  FOLATE 4.1*   Urinalysis    Component Value Date/Time   COLORURINE AMBER (A) 03/01/2024 1519   APPEARANCEUR CLEAR 03/01/2024 1519   LABSPEC 1.030  03/01/2024 1519   PHURINE 5.0 03/01/2024 1519   GLUCOSEU NEGATIVE 03/01/2024 1519   HGBUR NEGATIVE 03/01/2024 1519   BILIRUBINUR MODERATE (A) 03/01/2024 1519   KETONESUR NEGATIVE 03/01/2024 1519   PROTEINUR 30 (A) 03/01/2024 1519   NITRITE POSITIVE (A) 03/01/2024 1519   LEUKOCYTESUR NEGATIVE 03/01/2024 1519   Sepsis Labs Recent Labs  Lab 03/03/24 0439 03/04/24 0453 03/04/24 0937 03/05/24 0519  WBC 4.2 4.2 4.0 3.9*   Microbiology Recent Results (from  the past 240 hours)  Urine Culture (for pregnant, neutropenic or urologic patients or patients with an indwelling urinary catheter)     Status: Abnormal   Collection Time: 03/01/24  3:19 PM   Specimen: Urine, Clean Catch  Result Value Ref Range Status   Specimen Description URINE, CLEAN CATCH  Final   Special Requests ADDED 0001 03/02/2024  Final   Culture (A)  Final    <10,000 COLONIES/mL INSIGNIFICANT GROWTH Performed at [facility], [address], [place], [place] [address]    Report Status 03/02/2024 FINAL  Final  Culture, body fluid w Gram Stain-bottle     Status: None (Preliminary result)   Collection Time: 03/02/24  1:04 PM   Specimen: Fluid  Result Value Ref Range Status   Specimen Description FLUID PERITONEAL  Final   Special Requests BOTTLES DRAWN AEROBIC AND ANAEROBIC  Final   Culture   Final    NO GROWTH 3 DAYS Performed at [facility], [address], [place], [place] [address]    Report Status PENDING  Incomplete  Gram stain     Status: None   Collection Time: 03/02/24  1:04 PM   Specimen: Fluid  Result Value Ref Range Status   Specimen Description FLUID PERITONEAL  Final   Special Requests NONE  Final   Gram Stain   Final    WBC PRESENT, PREDOMINANTLY MONONUCLEAR NO ORGANISMS SEEN CYTOSPIN SMEAR Performed at [facility], [address] [place], [place] [address]    Report Status 03/02/2024 FINAL  Final     Time coordinating discharge:  I have spent 35 minutes face to face  with the patient and on the ward discussing the patients care, assessment, plan and disposition with other care givers. >50% of the time was devoted counseling the patient about the risks and benefits of treatment/Discharge disposition and coordinating care.   SIGNED:   Miguel Rota, MD  [facility] 03/05/2024, 11:11 AM   If 7PM-7AM, please contact night-coverage

## 2024-03-05 NOTE — Progress Notes (Signed)
 Stephania Fragmin, MD aware of patient refusal to stay and will give patient AMA form to sign and allow to leave.  MD explained risk to patient and patient is fully alert and oriented.  AMA form signed.  IV removed.

## 2024-03-05 NOTE — Plan of Care (Signed)

## 2024-03-07 LAB — CULTURE, BODY FLUID W GRAM STAIN -BOTTLE: Culture: NO GROWTH

## 2024-03-10 ENCOUNTER — Inpatient Hospital Stay (HOSPITAL_COMMUNITY)
Admission: EM | Admit: 2024-03-10 | Discharge: 2024-03-15 | DRG: 433 | Disposition: A | Discharge status: Home / Self Care | Payer: MEDICAID | Attending: Family Medicine | Admitting: Family Medicine

## 2024-03-10 ENCOUNTER — Other Ambulatory Visit: Payer: Self-pay

## 2024-03-10 ENCOUNTER — Emergency Department (HOSPITAL_COMMUNITY): Payer: MEDICAID

## 2024-03-10 ENCOUNTER — Encounter (HOSPITAL_COMMUNITY): Payer: Self-pay | Admitting: *Deleted

## 2024-03-10 DIAGNOSIS — E538 Deficiency of other specified B group vitamins: Secondary | ICD-10-CM | POA: Diagnosis present

## 2024-03-10 DIAGNOSIS — R410 Disorientation, unspecified: Secondary | ICD-10-CM | POA: Diagnosis present

## 2024-03-10 DIAGNOSIS — D649 Anemia, unspecified: Secondary | ICD-10-CM

## 2024-03-10 DIAGNOSIS — F17210 Nicotine dependence, cigarettes, uncomplicated: Secondary | ICD-10-CM | POA: Diagnosis present

## 2024-03-10 DIAGNOSIS — D539 Nutritional anemia, unspecified: Secondary | ICD-10-CM | POA: Diagnosis present

## 2024-03-10 DIAGNOSIS — K2920 Alcoholic gastritis without bleeding: Secondary | ICD-10-CM | POA: Diagnosis present

## 2024-03-10 DIAGNOSIS — K7011 Alcoholic hepatitis with ascites: Secondary | ICD-10-CM | POA: Diagnosis present

## 2024-03-10 DIAGNOSIS — B199 Unspecified viral hepatitis without hepatic coma: Secondary | ICD-10-CM | POA: Diagnosis present

## 2024-03-10 DIAGNOSIS — D684 Acquired coagulation factor deficiency: Secondary | ICD-10-CM | POA: Diagnosis present

## 2024-03-10 DIAGNOSIS — L299 Pruritus, unspecified: Secondary | ICD-10-CM | POA: Diagnosis present

## 2024-03-10 DIAGNOSIS — F410 Panic disorder [episodic paroxysmal anxiety] without agoraphobia: Secondary | ICD-10-CM | POA: Diagnosis present

## 2024-03-10 DIAGNOSIS — K8020 Calculus of gallbladder without cholecystitis without obstruction: Secondary | ICD-10-CM | POA: Diagnosis present

## 2024-03-10 DIAGNOSIS — F1010 Alcohol abuse, uncomplicated: Secondary | ICD-10-CM

## 2024-03-10 DIAGNOSIS — F10939 Alcohol use, unspecified with withdrawal, unspecified: Secondary | ICD-10-CM | POA: Diagnosis present

## 2024-03-10 DIAGNOSIS — R278 Other lack of coordination: Secondary | ICD-10-CM | POA: Diagnosis present

## 2024-03-10 DIAGNOSIS — R140 Abdominal distension (gaseous): Secondary | ICD-10-CM | POA: Diagnosis present

## 2024-03-10 DIAGNOSIS — K7290 Hepatic failure, unspecified without coma: Secondary | ICD-10-CM | POA: Diagnosis present

## 2024-03-10 DIAGNOSIS — E871 Hypo-osmolality and hyponatremia: Secondary | ICD-10-CM | POA: Diagnosis not present

## 2024-03-10 DIAGNOSIS — F17290 Nicotine dependence, other tobacco product, uncomplicated: Secondary | ICD-10-CM | POA: Diagnosis present

## 2024-03-10 DIAGNOSIS — R000 Tachycardia, unspecified: Secondary | ICD-10-CM | POA: Diagnosis present

## 2024-03-10 DIAGNOSIS — Y908 Blood alcohol level of 240 mg/100 ml or more: Secondary | ICD-10-CM | POA: Diagnosis present

## 2024-03-10 DIAGNOSIS — D6959 Other secondary thrombocytopenia: Secondary | ICD-10-CM | POA: Diagnosis present

## 2024-03-10 DIAGNOSIS — Z79899 Other long term (current) drug therapy: Secondary | ICD-10-CM | POA: Diagnosis not present

## 2024-03-10 DIAGNOSIS — I8510 Secondary esophageal varices without bleeding: Secondary | ICD-10-CM | POA: Diagnosis present

## 2024-03-10 DIAGNOSIS — J9811 Atelectasis: Secondary | ICD-10-CM | POA: Diagnosis present

## 2024-03-10 DIAGNOSIS — Z720 Tobacco use: Secondary | ICD-10-CM | POA: Diagnosis present

## 2024-03-10 DIAGNOSIS — D696 Thrombocytopenia, unspecified: Secondary | ICD-10-CM | POA: Diagnosis present

## 2024-03-10 DIAGNOSIS — F10139 Alcohol abuse with withdrawal, unspecified: Secondary | ICD-10-CM | POA: Diagnosis present

## 2024-03-10 DIAGNOSIS — Z91199 Patient's noncompliance with other medical treatment and regimen due to unspecified reason: Secondary | ICD-10-CM | POA: Diagnosis not present

## 2024-03-10 DIAGNOSIS — R1084 Generalized abdominal pain: Principal | ICD-10-CM

## 2024-03-10 DIAGNOSIS — Z23 Encounter for immunization: Secondary | ICD-10-CM

## 2024-03-10 DIAGNOSIS — K766 Portal hypertension: Secondary | ICD-10-CM | POA: Diagnosis present

## 2024-03-10 DIAGNOSIS — K7031 Alcoholic cirrhosis of liver with ascites: Secondary | ICD-10-CM | POA: Diagnosis present

## 2024-03-10 DIAGNOSIS — K7460 Unspecified cirrhosis of liver: Secondary | ICD-10-CM | POA: Diagnosis present

## 2024-03-10 DIAGNOSIS — K7010 Alcoholic hepatitis without ascites: Secondary | ICD-10-CM

## 2024-03-10 DIAGNOSIS — R7989 Other specified abnormal findings of blood chemistry: Secondary | ICD-10-CM | POA: Diagnosis present

## 2024-03-10 HISTORY — DX: Hepatic failure, unspecified without coma: K72.90

## 2024-03-10 LAB — BASIC METABOLIC PANEL
Anion gap: 12 (ref 5–15)
BUN: 7 mg/dL (ref 6–20)
CO2: 25 mmol/L (ref 22–32)
Calcium: 8.4 mg/dL — ABNORMAL LOW (ref 8.9–10.3)
Chloride: 98 mmol/L (ref 98–111)
Creatinine, Ser: 0.6 mg/dL — ABNORMAL LOW (ref 0.61–1.24)
GFR, Estimated: >60 mL/min (ref >60)
Glucose, Bld: 156 mg/dL — ABNORMAL HIGH (ref 70–99)
Potassium: 4 mmol/L (ref 3.5–5.1)
Sodium: 135 mmol/L (ref 135–145)

## 2024-03-10 LAB — CBC WITH DIFFERENTIAL/PLATELET
Abs Immature Granulocytes: 0.02 10*3/uL (ref 0.00–0.07)
Basophils Absolute: 0.1 10*3/uL (ref 0.0–0.1)
Basophils Relative: 2 %
Eosinophils Absolute: 0.1 10*3/uL (ref 0.0–0.5)
Eosinophils Relative: 2 %
HCT: 30.1 % — ABNORMAL LOW (ref 39.0–52.0)
Hemoglobin: 11 g/dL — ABNORMAL LOW (ref 13.0–17.0)
Immature Granulocytes: 0 %
Lymphocytes Relative: 36 %
Lymphs Abs: 2.4 10*3/uL (ref 0.7–4.0)
MCH: 39.1 pg — ABNORMAL HIGH (ref 26.0–34.0)
MCHC: 36.5 g/dL — ABNORMAL HIGH (ref 30.0–36.0)
MCV: 107.1 fL — ABNORMAL HIGH (ref 80.0–100.0)
Monocytes Absolute: 0.9 10*3/uL (ref 0.1–1.0)
Monocytes Relative: 14 %
Neutro Abs: 3 10*3/uL (ref 1.7–7.7)
Neutrophils Relative %: 46 %
Platelets: 51 10*3/uL — ABNORMAL LOW (ref 150–400)
RBC: 2.81 MIL/uL — ABNORMAL LOW (ref 4.22–5.81)
RDW: 15.3 % (ref 11.5–15.5)
WBC: 6.5 10*3/uL (ref 4.0–10.5)
nRBC: 0 % (ref 0.0–0.2)

## 2024-03-10 LAB — ETHANOL: Alcohol, Ethyl (B): 250 mg/dL — ABNORMAL HIGH (ref <10)

## 2024-03-10 LAB — BRAIN NATRIURETIC PEPTIDE: B Natriuretic Peptide: 30 pg/mL (ref 0.0–100.0)

## 2024-03-10 LAB — URINALYSIS, ROUTINE W REFLEX MICROSCOPIC
Bacteria, UA: NONE SEEN
Glucose, UA: NEGATIVE mg/dL
Hgb urine dipstick: NEGATIVE
Ketones, ur: NEGATIVE mg/dL
Leukocytes,Ua: NEGATIVE
Nitrite: NEGATIVE
Protein, ur: 30 mg/dL — AB
Specific Gravity, Urine: 1.03 (ref 1.005–1.030)
pH: 5 (ref 5.0–8.0)

## 2024-03-10 LAB — PROTIME-INR
INR: 1.6 — ABNORMAL HIGH (ref 0.8–1.2)
Prothrombin Time: 18.9 s — ABNORMAL HIGH (ref 11.4–15.2)

## 2024-03-10 LAB — AMMONIA: Ammonia: 49 umol/L — ABNORMAL HIGH (ref 9–35)

## 2024-03-10 LAB — MAGNESIUM: Magnesium: 2.1 mg/dL (ref 1.7–2.4)

## 2024-03-10 LAB — HEPATIC FUNCTION PANEL
ALT: 89 U/L — ABNORMAL HIGH (ref 0–44)
AST: 300 U/L — ABNORMAL HIGH (ref 15–41)
Albumin: 2.8 g/dL — ABNORMAL LOW (ref 3.5–5.0)
Alkaline Phosphatase: 110 U/L (ref 38–126)
Bilirubin, Direct: 2.2 mg/dL — ABNORMAL HIGH (ref 0.0–0.2)
Indirect Bilirubin: 2.6 mg/dL — ABNORMAL HIGH (ref 0.3–0.9)
Total Bilirubin: 4.8 mg/dL — ABNORMAL HIGH (ref 0.0–1.2)
Total Protein: 7.2 g/dL (ref 6.5–8.1)

## 2024-03-10 LAB — RAPID URINE DRUG SCREEN, HOSP PERFORMED
Amphetamines: NOT DETECTED
Barbiturates: NOT DETECTED
Benzodiazepines: POSITIVE — AB
Cocaine: NOT DETECTED
Opiates: NOT DETECTED
Tetrahydrocannabinol: NOT DETECTED

## 2024-03-10 LAB — TROPONIN I (HIGH SENSITIVITY)
Troponin I (High Sensitivity): 5 ng/L (ref <18)
Troponin I (High Sensitivity): 4 ng/L (ref <18)

## 2024-03-10 LAB — LACTIC ACID, PLASMA
Lactic Acid, Venous: 2.5 mmol/L (ref 0.5–1.9)
Lactic Acid, Venous: 1.7 mmol/L (ref 0.5–1.9)

## 2024-03-10 LAB — LIPASE, BLOOD: Lipase: 46 U/L (ref 11–51)

## 2024-03-10 MED ORDER — SODIUM CHLORIDE 0.9 % IV SOLN
2.0000 g | INTRAVENOUS | Status: AC
  Administered 2024-03-11 – 2024-03-15 (×5): 2 g via INTRAVENOUS
  Filled 2024-03-10 (×5): qty 20

## 2024-03-10 MED ORDER — PANTOPRAZOLE SODIUM 40 MG PO TBEC
40.0000 mg | DELAYED_RELEASE_TABLET | Freq: Every day | ORAL | Status: DC
  Administered 2024-03-10 – 2024-03-15 (×6): 40 mg via ORAL
  Filled 2024-03-10 (×6): qty 1

## 2024-03-10 MED ORDER — FUROSEMIDE 40 MG PO TABS
40.0000 mg | ORAL_TABLET | Freq: Every day | ORAL | Status: DC
  Administered 2024-03-12 – 2024-03-15 (×4): 40 mg via ORAL
  Filled 2024-03-10 (×4): qty 1

## 2024-03-10 MED ORDER — INFLUENZA VIRUS VACC SPLIT PF (FLUZONE) 0.5 ML IM SUSY
0.5000 mL | PREFILLED_SYRINGE | INTRAMUSCULAR | Status: AC
  Administered 2024-03-11: 0.5 mL via INTRAMUSCULAR
  Filled 2024-03-10: qty 0.5

## 2024-03-10 MED ORDER — LABETALOL HCL 5 MG/ML IV SOLN
10.0000 mg | INTRAVENOUS | Status: DC | PRN
  Administered 2024-03-10: 10 mg via INTRAVENOUS
  Filled 2024-03-10: qty 4

## 2024-03-10 MED ORDER — SODIUM CHLORIDE 0.9 % IV SOLN: 2.0000 g | INTRAVENOUS | Status: DC

## 2024-03-10 MED ORDER — ACETAMINOPHEN 325 MG PO TABS
650.0000 mg | ORAL_TABLET | Freq: Four times a day (QID) | ORAL | Status: DC | PRN
  Administered 2024-03-13: 650 mg via ORAL
  Filled 2024-03-10: qty 2

## 2024-03-10 MED ORDER — SPIRONOLACTONE 25 MG PO TABS
100.0000 mg | ORAL_TABLET | Freq: Once | ORAL | Status: AC
  Administered 2024-03-10: 100 mg via ORAL
  Filled 2024-03-10: qty 4

## 2024-03-10 MED ORDER — LORAZEPAM 1 MG PO TABS
1.0000 mg | ORAL_TABLET | ORAL | Status: DC | PRN
  Filled 2024-03-10: qty 1

## 2024-03-10 MED ORDER — FOLIC ACID 1 MG PO TABS
1.0000 mg | ORAL_TABLET | Freq: Every day | ORAL | Status: DC
  Administered 2024-03-10 – 2024-03-15 (×6): 1 mg via ORAL
  Filled 2024-03-10 (×6): qty 1

## 2024-03-10 MED ORDER — ONDANSETRON HCL 4 MG/2ML IJ SOLN
4.0000 mg | Freq: Four times a day (QID) | INTRAMUSCULAR | Status: DC | PRN
  Administered 2024-03-15: 4 mg via INTRAVENOUS
  Filled 2024-03-10: qty 2

## 2024-03-10 MED ORDER — VITAMIN B-12 1000 MCG PO TABS
500.0000 ug | ORAL_TABLET | Freq: Every day | ORAL | Status: DC
Start: 2024-03-10 — End: 2024-03-15
  Administered 2024-03-10 – 2024-03-15 (×6): 500 ug via ORAL
  Filled 2024-03-10 (×6): qty 1

## 2024-03-10 MED ORDER — THIAMINE HCL 100 MG/ML IJ SOLN
100.0000 mg | Freq: Every day | INTRAMUSCULAR | Status: DC
  Administered 2024-03-13: 100 mg via INTRAVENOUS
  Filled 2024-03-10: qty 2

## 2024-03-10 MED ORDER — ACETAMINOPHEN 650 MG RE SUPP: 650.0000 mg | Freq: Four times a day (QID) | RECTAL | Status: DC | PRN

## 2024-03-10 MED ORDER — FUROSEMIDE 40 MG PO TABS
40.0000 mg | ORAL_TABLET | Freq: Once | ORAL | Status: AC
  Administered 2024-03-10: 40 mg via ORAL
  Filled 2024-03-10: qty 1

## 2024-03-10 MED ORDER — ADULT MULTIVITAMIN W/MINERALS CH
1.0000 | ORAL_TABLET | Freq: Every day | ORAL | Status: DC
  Administered 2024-03-10 – 2024-03-15 (×6): 1 via ORAL
  Filled 2024-03-10 (×6): qty 1

## 2024-03-10 MED ORDER — THIAMINE MONONITRATE 100 MG PO TABS
100.0000 mg | ORAL_TABLET | Freq: Every day | ORAL | Status: DC
  Administered 2024-03-10 – 2024-03-15 (×5): 100 mg via ORAL
  Filled 2024-03-10 (×6): qty 1

## 2024-03-10 MED ORDER — LORAZEPAM 2 MG/ML IJ SOLN
1.0000 mg | INTRAMUSCULAR | Status: DC | PRN
  Administered 2024-03-12: 1 mg via INTRAVENOUS
  Filled 2024-03-10: qty 1

## 2024-03-10 MED ORDER — ONDANSETRON HCL 4 MG PO TABS: 4.0000 mg | ORAL_TABLET | Freq: Four times a day (QID) | ORAL | Status: DC | PRN

## 2024-03-10 MED ORDER — IOHEXOL 300 MG/ML  SOLN
100.0000 mL | Freq: Once | INTRAMUSCULAR | Status: AC | PRN
  Administered 2024-03-10: 100 mL via INTRAVENOUS

## 2024-03-10 MED ORDER — SPIRONOLACTONE 100 MG PO TABS
100.0000 mg | ORAL_TABLET | Freq: Every day | ORAL | Status: DC
  Administered 2024-03-12 – 2024-03-15 (×4): 100 mg via ORAL
  Filled 2024-03-10: qty 1
  Filled 2024-03-10: qty 4
  Filled 2024-03-10: qty 1
  Filled 2024-03-10: qty 4

## 2024-03-10 MED ORDER — HYDROMORPHONE HCL 1 MG/ML IJ SOLN
0.5000 mg | INTRAMUSCULAR | Status: DC | PRN
  Administered 2024-03-10 – 2024-03-15 (×20): 0.5 mg via INTRAVENOUS
  Filled 2024-03-10 (×20): qty 0.5

## 2024-03-10 MED ORDER — SODIUM CHLORIDE 0.9 % IV SOLN
2.0000 g | Freq: Once | INTRAVENOUS | Status: AC
  Administered 2024-03-10: 2 g via INTRAVENOUS
  Filled 2024-03-10: qty 20

## 2024-03-10 MED ORDER — HYDROMORPHONE HCL 1 MG/ML IJ SOLN
1.0000 mg | Freq: Once | INTRAMUSCULAR | Status: AC
  Administered 2024-03-10: 1 mg via INTRAVENOUS
  Filled 2024-03-10: qty 1

## 2024-03-10 NOTE — Discharge Instructions (Addendum)
 To find a rehab center in [place] contact:  [facility] ([email or web address])  Substance Use Agency [facility] [address] [place], [place] [address]  Phone Number: [phone]   Fax Number: [phone]  Web Site: [email or web address]     District 22 Meetings - AA [facility] COUNTY Monday  Blue Plate Special --12:00 noon Open Discussion [place]  - [employer] [address] [place]  Fellowship Group  - 8:00 pm  Open Speaker  [employer]  [address] [place]   Tuesday  Blue Plate Special --12:00 noon Open Discussion [place]  - [employer] [address] [place]  Fellowship Group  - 8:00 pm  Big Book Quotes Cards Open Discussion  First [address]  [address] [place]   Wednesday  Blue Plate Special --12:00 noon Open Discussion [place]  - [employer] [address] [place] Group  - 8:00 pm  Big Book Study  [employer]  [address]  [place]   Thursday  Blue Plate Special --12:00 noon Open Discussion [place]  - [employer] [address] [employer] Noon Meeting - 12:00 noon Open Discussion [employer] [address]  - [place] Group  - 7:00 pm Closed Discussion  [address] [facility]  [address] [place].  [place]   Fellowship Group  - 8:00 pm  [employer]  [address]  - [place]   Friday  Blue Plate Special --12:00 noon Open Discussion [place]  - [employer] [address] [place]  Take a Load Off  - 7:00 pm Women's Meeting [employer] [address]  [place]  Fellowship Group  - 8:00 pm  Open Discussion [employer]  [address]  - [place]   Saturday  12 Changes Group  - 9:00 am   Open Discussion  [facility] [employer] [facility]  [address]  - [place]   [place] Group  - 6:00 pm Open Discussion [facility] [address] [place]  Fellowship Group  - 8:00 pm  Open Discussion [employer]  [address]  - [place]  Sunday  Fellowship Group  - 8:00 pm  Open Discussion/12 & 12 Study [facility] [address]  [address]  - [place]   Low Sodium Nutrition Therapy   Eating less sodium can help you if you have high blood pressure, heart failure, or kidney or liver disease.   Your body needs a little sodium, but too much sodium can cause your body to hold onto extra water. This extra water will raise your blood pressure and can cause damage to your heart, kidneys, or liver as they are forced to work harder.   Sometimes you can see how the extra fluid affects you because your hands, legs, or belly swell. You may also hold water around your heart and lungs, which makes it hard to breathe.   Even if you take medication for blood pressure or a water pill (diuretic) to remove fluid, it is still important to have less salt in your diet.   Check with your primary care provider before drinking alcohol since it may affect the amount of fluid in your body and how your heart, kidneys, or liver work. Sodium in Food  A low-sodium meal plan limits the sodium that you get from food and beverages to 1,500-2,000 milligrams (mg) per day. Salt is the main source of sodium. Read the nutrition label on the package to find out how much sodium is in one serving of a food.  Select foods with 140 milligrams (mg) of sodium or less per serving.  You may be able to eat one or two servings of foods with a little more than 140 milligrams (mg) of sodium if you are closely watching how much sodium you eat in a day.  Check the serving size on the label. The amount of sodium listed on the label shows the amount in one serving of the food. So, if you eat  more than one serving, you will get more sodium than the amount listed.  Tips Cutting Back on Sodium Eat more fresh foods.  Fresh fruits and vegetables are low in sodium, as well as frozen vegetables and fruits that have no added juices or sauces.  Fresh meats are lower in sodium than processed meats, such as bacon, sausage, and hotdogs.  Not all processed foods are unhealthy, but some processed foods may have too much sodium.  Eat less salt at the table and when cooking. One of the ingredients in salt is sodium.  One teaspoon of table salt has 2,300 milligrams of sodium.  Leave the salt out of recipes for pasta, casseroles, and soups. Be a Engineer, building services.  Food packages that say "Salt-free", sodium-free", "very low sodium," and "low sodium" have less than 140 milligrams of sodium per serving.  Beware of products identified as "Unsalted," "No Salt Added," "Reduced Sodium," or "Lower Sodium." These items may still be high in sodium. You should always check the nutrition label. Add flavors to your food without adding sodium.  Try lemon juice, lime juice, or vinegar.  Dry or fresh herbs add flavor.  Buy a sodium-free seasoning blend or make your own at home. You can purchase salt-free or sodium-free condiments like barbeque sauce in stores and online. Ask your registered dietitian nutritionist for recommendations and where to find them.   Eating in Restaurants Choose foods carefully when you eat outside your home. Restaurant foods can be very high in sodium. Many restaurants provide nutrition facts on their menus or their websites. If you cannot find that information, ask your server. Let your server know that you want your food to be cooked without salt and that you would like your salad dressing and sauces to be served on the side.    Foods Recommended Food Group Foods Recommended  Grains Bread, bagels, rolls without salted tops Homemade bread made with reduced-sodium baking powder Cold  cereals, especially shredded wheat and puffed rice Oats, grits, or cream of wheat Pastas, quinoa, and rice Popcorn, pretzels or crackers without salt Corn tortillas  Protein Foods Fresh meats and fish; [place] bacon (check the nutrition labels - make sure they are not packaged in a sodium solution) Canned or packed tuna (no more than 4 ounces at 1 serving) Beans and peas Soybeans) and tofu Eggs Nuts or nut butters without salt  Dairy Milk or milk powder Plant milks, such as rice and soy Yogurt, including Greek yogurt Small amounts of natural cheese (blocks of cheese) or reduced-sodium cheese can be used in moderation. (Swiss, ricotta, and fresh mozzarella cheese are lower in sodium than the others) Cream Cheese Low sodium cottage cheese  Vegetables Fresh and frozen vegetables without added sauces or salt Homemade soups (without  salt) Low-sodium, salt-free or sodium-free canned vegetables and soups  Fruit Fresh and canned fruits Dried fruits, such as raisins, cranberries, and prunes  Oils Tub or liquid margarine, regular or without salt Canola, corn, peanut, olive, safflower, or sunflower oils  Condiments Fresh or dried herbs such as basil, bay leaf, dill, mustard (dry), nutmeg, paprika, parsley, rosemary, sage, or thyme.  Low sodium ketchup Vinegar  Lemon or lime juice Pepper, red pepper flakes, and cayenne. Hot sauce contains sodium, but if you use just a drop or two, it will not add up to much.  Salt-free or sodium-free seasoning mixes and marinades Simple salad dressings: vinegar and oil   Foods Not Recommended Food Group Foods Not Recommended  Grains Breads or crackers topped with salt Cereals (hot/cold) with more than 300 mg sodium per serving Biscuits, cornbread, and other "quick" breads prepared with baking soda Pre-packaged bread crumbs Seasoned and packaged rice and pasta mixes Self-rising flours  Protein Foods Cured meats: Bacon, ham, sausage, pepperoni and hot  dogs Canned meats (chili, vienna sausage, or sardines) Smoked fish and meats Frozen meals that have more than 600 mg of sodium per serving Egg substitute (with added sodium)  Dairy Buttermilk Processed cheese spreads Cottage cheese (1 cup may have over 500 mg of sodium; look for low-sodium.) American or feta cheese Shredded Cheese has more sodium than blocks of cheese String cheese  Vegetables Canned vegetables (unless they are salt-free, sodium-free or low sodium) Frozen vegetables with seasoning and sauces Sauerkraut and pickled vegetables Canned or dried soups (unless they are salt-free, sodium-free, or low sodium) [place] fries and onion rings  Fruit Dried fruits preserved with additives that have sodium  Oils Salted butter or margarine, all types of olives  Condiments Salt, sea salt, kosher salt, onion salt, and garlic salt Seasoning mixes with salt Bouillon cubes Ketchup Barbeque sauce and Worcestershire sauce unless low sodium Soy sauce Salsa, pickles, olives, relish Salad dressings: ranch, blue cheese, [place], and [place].   Low Sodium Sample 1-Day Menu  Breakfast 1 cup cooked oatmeal  1 slice whole wheat bread toast  1 tablespoon peanut butter without salt  1 banana  1 cup 1% milk  Lunch Tacos made with: 2 corn tortillas   cup black beans, low sodium   cup roasted or grilled chicken (without skin)   avocado  Squeeze of lime juice  1 cup salad greens  1 tablespoon low-sodium salad dressing   cup strawberries  1 orange  Afternoon Snack 1/3 cup grapes  6 ounces yogurt  Evening Meal 3 ounces herb-baked fish  1 baked potato  2 teaspoons olive oil   cup cooked carrots  2 thick slices tomatoes on:  2 lettuce leaves  1 teaspoon olive oil  1 teaspoon balsamic vinegar  1 cup 1% milk  Evening Snack 1 apple   cup almonds without salt   Low-Sodium Vegetarian (Lacto-Ovo) Sample 1-Day Menu  Breakfast 1 cup cooked oatmeal  1 slice whole wheat toast  1  tablespoon peanut butter without salt  1 banana  1 cup 1% milk  Lunch Tacos made with: 2 corn tortillas   cup black beans, low sodium   cup roasted or grilled chicken (without skin)   avocado  Squeeze of lime juice  1 cup salad greens  1 tablespoon low-sodium salad dressing   cup strawberries  1 orange  Evening Meal Stir fry made with:  cup tofu  1 cup brown rice   cup broccoli   cup green beans  cup peppers   tablespoon peanut oil  1 orange  1 cup 1% milk  Evening Snack 4 strips celery  2 tablespoons hummus  1 hard-boiled egg   Low-Sodium Vegan Sample 1-Day Menu  Breakfast 1 cup cooked oatmeal  1 tablespoon peanut butter without salt  1 cup blueberries  1 cup soymilk fortified with calcium, vitamin B12, and vitamin D  Lunch 1 small whole wheat pita   cup cooked lentils  2 tablespoons hummus  4 carrot sticks  1 medium apple  1 cup soymilk fortified with calcium, vitamin B12, and vitamin D  Evening Meal Stir fry made with:  cup tofu  1 cup brown rice   cup broccoli   cup green beans   cup peppers   tablespoon peanut oil  1 cup cantaloupe  Evening Snack 1 cup soy yogurt   cup mixed nuts  Copyright 2020  Academy of Nutrition and Dietetics. All rights reserved  Sodium Free Flavoring Tips  When cooking, the following items may be used for flavoring instead of salt or seasonings that contain sodium. Remember: A little bit of spice goes a long way! Be careful not to overseason. Spice Blend Recipe (makes about ? cup) 5 teaspoons onion powder  2 teaspoons garlic powder  2 teaspoons paprika  2 teaspoon dry mustard  1 teaspoon crushed thyme leaves   teaspoon white pepper   teaspoon celery seed Food Item Flavorings  Beef Basil, bay leaf, caraway, curry, dill, dry mustard, garlic, grape jelly, green pepper, mace, marjoram, mushrooms (fresh), nutmeg, onion or onion powder, parsley, pepper, rosemary, sage  Chicken Basil, cloves, cranberries, mace,  mushrooms (fresh), nutmeg, oregano, paprika, parsley, pineapple, saffron, sage, savory, tarragon, thyme, tomato, turmeric  Egg Chervil, curry, dill, dry mustard, garlic or garlic powder, green pepper, jelly, mushrooms (fresh), nutmeg, onion powder, paprika, parsley, rosemary, tarragon, tomato  Fish Basil, bay leaf, chervil, curry, dill, dry mustard, green pepper, lemon juice, marjoram, mushrooms (fresh), paprika, pepper, tarragon, tomato, turmeric  Lamb Cloves, curry, dill, garlic or garlic powder, mace, mint, mint jelly, onion, oregano, parsley, pineapple, rosemary, tarragon, thyme  Pork Applesauce, basil, caraway, chives, cloves, garlic or garlic powder, onion or onion powder, rosemary, thyme  Veal Apricots, basil, bay leaf, currant jelly, curry, ginger, marjoram, mushrooms (fresh), oregano, paprika  Vegetables Basil, dill, garlic or garlic powder, ginger, lemon juice, mace, marjoram, nutmeg, onion or onion powder, tarragon, tomato, sugar or sugar substitute, salt-free salad dressing, vinegar  Desserts Allspice, anise, cinnamon, cloves, ginger, mace, nutmeg, vanilla extract, other extracts   Copyright 2020  Academy of Nutrition and Dietetics. All rights reserved  Fluid Restricted Nutrition Therapy  You have been prescribed this diet because your condition affects how much fluid you can eat or drink. If your heart, liver, or kidneys aren't working properly, you may not be able to effectively eliminate fluids from the body and this may cause swelling (edema) in the legs, arms, and/or stomach. Drink no more than _________ liters or ________ ounces or ________cups of fluid per day.  You don't need to stop eating or drinking the same fluids you normally would, but you may need to eat or drink less than usual.  Your registered dietitian nutritionist will help you determine the correct amount of fluid to consume during the day Breakfast Include fluids taken with medications  Lunch Include fluids taken  with medications  Dinner Include fluids taken with medications  Bedtime Snack Include fluids taken with medications     Tips What Are Fluids?  A fluid is anything that is liquid or anything that would melt if left at room temperature. You will need to count these foods and liquids--including any liquid used to take medication--as part of your daily fluid intake. Some examples are: Alcohol (drink only with your doctor's permission)  Coffee, tea, and other hot beverages  Gelatin (Jell-O)  Gravy  Ice cream, sherbet, sorbet  Ice cubes, ice chips  Milk, liquid creamer  Nutritional supplements  Popsicles  Vegetable and fruit juices; fluid in canned fruit  Watermelon  Yogurt  Soft drinks, lemonade, limeade  Soups  Syrup How Do I Measure My Fluid Intake? Record your fluid intake daily.  Tip: Every day, each time you eat or drink fluids, pour water in the same amount into an empty container that can hold the same amount of fluids you are allowed daily. This may help you keep track of how much fluid you are taking in throughout the day.  To accurately keep track of how much liquid you take in, measure the size of the cups, glasses, and bowls you use. If you eat soup, measure how much of it is liquid and how much is solid (such as noodles, vegetables, meat). Conversions for Measuring Fluid Intake  Milliliters (mL) Liters (L) Ounces (oz) Cups (c)  1000 1 32 4  1200 1.2 40 5  1500 1.5 50 6 1/4  1800 1.8 60 7 1/2  2000 2 67 8 1/3  Tips to Reduce Your Thirst Chew gum or suck on hard candy.  Rinse or gargle with mouthwash. Do not swallow.  Ice chips or popsicles my help quench thirst, but this too needs to be calculated into the total restriction. Melt ice chips or cubes first to figure out how much fluid they produce (for example, experiment with melting  cup ice chips or 2 ice cubes).  Add a lemon wedge to your water.  Limit how much salt you take in. A high salt intake might make you  thirstier.  Don't eat or drink all your allowed liquids at once. Space your liquids out through the day.  Use small glasses and cups and sip slowly. If allowed, take your medications with fluids you eat or drink during a meal.   Fluid-Restricted Nutrition Therapy Sample 1-Day Menu  Breakfast 1 slice wheat toast  1 tablespoon peanut butter  1/2 cup yogurt (120 milliliters)  1/2 cup blueberries  1 cup milk (240 milliliters)   Lunch 3 ounces sliced [place]  2 slices whole wheat bread  1/2 cup lettuce for sandwich  2 slices tomato for sandwich  1 ounce reduced-fat, reduced-sodium cheese  1/2 cup fresh carrot sticks  1 banana  1 cup unsweetened tea (240 milliliters)   Evening Meal 8 ounces soup (240 milliliters)  3 ounces salmon  1/2 cup quinoa  1 cup green beans  1 cup mixed greens salad  1 tablespoon olive oil  1 cup coffee (240 milliliters)  Evening Snack 1/2 cup sliced peaches  1/2 cup frozen yogurt (120 milliliters)  1 cup water (240 milliliters)  Copyright 2020  Academy of Nutrition and Dietetics. All rights reserved   IMPORTANT INFORMATION: PAY CLOSE ATTENTION   PHYSICIAN DISCHARGE INSTRUCTIONS  Follow with Primary care provider  Patient, [name]  and other consultants as instructed by your Hospitalist Physician  SEEK MEDICAL CARE OR RETURN TO EMERGENCY ROOM IF SYMPTOMS COME BACK, WORSEN OR NEW PROBLEM DEVELOPS   Please note: You were cared for by a hospitalist during  your hospital stay. Every effort will be made to forward records to your primary care provider.  You can request that your primary care provider send for your hospital records if they have not received them.  Once you are discharged, your primary care physician will handle any further medical issues. Please note that NO REFILLS for any discharge medications will be authorized once you are discharged, as it is imperative that you return to your primary care physician (or establish a relationship with a  primary care physician if you do not have one) for your post hospital discharge needs so that they can reassess your need for medications and monitor your lab values.  Please get a complete blood count and chemistry panel checked by your Primary MD at your next visit, and again as instructed by your Primary MD.  Get Medicines reviewed and adjusted: Please take all your medications with you for your next visit with your Primary MD  Laboratory/radiological data: Please request your Primary MD to go over all hospital tests and procedure/radiological results at the follow up, please ask your primary care provider to get all Hospital records sent to his/her office.  In some cases, they will be blood work, cultures and biopsy results pending at the time of your discharge. Please request that your primary care provider follow up on these results.  If you are diabetic, please bring your blood sugar readings with you to your follow up appointment with primary care.    Please call and make your follow up appointments as soon as possible.    Also Note the following: If you experience worsening of your admission symptoms, develop shortness of breath, life threatening emergency, suicidal or homicidal thoughts you must seek medical attention immediately by calling 911 or calling your MD immediately  if symptoms less severe.  You must read complete instructions/literature along with all the possible adverse reactions/side effects for all the Medicines you take and that have been prescribed to you. Take any new Medicines after you have completely understood and accpet all the possible adverse reactions/side effects.   Do not drive when taking Pain medications or sleeping medications (Benzodiazepines)  Do not take more than prescribed Pain, Sleep and Anxiety Medications. It is not advisable to combine anxiety,sleep and pain medications without talking with your primary care practitioner  Special  Instructions: If you have smoked or chewed Tobacco  in the last 2 yrs please stop smoking, stop any regular Alcohol  and or any Recreational drug use.  Wear Seat belts while driving.  Do not drive if taking any narcotic, mind altering or controlled substances or recreational drugs or alcohol.

## 2024-03-10 NOTE — ED Triage Notes (Signed)
 Pt BIB [facility] for c/o abdominal pain; pt has cirrhosis and was recently hospitalized at [facility] and had fluid drained from his abdomen  BP 150/110 P 110's  Pt non-compliant with care

## 2024-03-10 NOTE — ED Provider Notes (Addendum)
 [place] EMERGENCY DEPARTMENT AT [facility] Provider Note   CSN: [id number] Arrival date & time: 03/10/24  0355     History  Chief Complaint  Patient presents with   Abdominal Pain    Gregory Murphy is a 38 y.o. male.  Patient presents to the emergency department for evaluation of abdominal pain.  Patient reports that he has had persistent abdominal pain, abdominal distention and shortness of breath since yesterday.       Home Medications Prior to Admission medications   Medication Sig Start Date End Date Taking? Authorizing Provider  vitamin B-12 (CYANOCOBALAMIN) 500 MCG tablet Take 500 mcg by mouth daily.    [provider]      Allergies    Patient has no known allergies.    Review of Systems   Review of Systems  Physical Exam Updated Vital Signs BP 125/88   Pulse (!) 116   Temp 98.7 F (37.1 C) (Oral)   Resp 17   Ht 5\' 1"  (1.549 m)   Wt 50 kg   SpO2 98%   BMI 20.83 kg/m  Physical Exam Vitals and nursing note reviewed.  Constitutional:      General: He is not in acute distress.    Appearance: He is well-developed.  HENT:     Head: Normocephalic and atraumatic.     Mouth/Throat:     Mouth: Mucous membranes are moist.  Eyes:     General: Vision grossly intact. Gaze aligned appropriately.     Extraocular Movements: Extraocular movements intact.     Conjunctiva/sclera: Conjunctivae normal.  Cardiovascular:     Rate and Rhythm: Normal rate and regular rhythm.     Pulses: Normal pulses.     Heart sounds: Normal heart sounds, S1 normal and S2 normal. No murmur heard.    No friction rub. No gallop.  Pulmonary:     Effort: Pulmonary effort is normal. No respiratory distress.     Breath sounds: Normal breath sounds.  Abdominal:     General: There is distension.     Palpations: Abdomen is soft.     Tenderness: There is generalized abdominal tenderness. There is no guarding or rebound.     Hernia: No hernia is present.   Musculoskeletal:        General: No swelling.     Cervical back: Full passive range of motion without pain, normal range of motion and neck supple. No pain with movement, spinous process tenderness or muscular tenderness. Normal range of motion.     Right lower leg: No edema.     Left lower leg: No edema.  Skin:    General: Skin is warm and dry.     Capillary Refill: Capillary refill takes less than 2 seconds.     Findings: No ecchymosis, erythema, lesion or wound.  Neurological:     Mental Status: He is alert and oriented to person, place, and time.     GCS: GCS eye subscore is 4. GCS verbal subscore is 5. GCS motor subscore is 6.     Cranial Nerves: Cranial nerves 2-12 are intact.     Sensory: Sensation is intact.     Motor: Motor function is intact. No weakness or abnormal muscle tone.     Coordination: Coordination is intact.  Psychiatric:        Mood and Affect: Mood normal.        Speech: Speech normal.        Behavior: Behavior normal.  ED Results / Procedures / Treatments   Labs (all labs ordered are listed, but only abnormal results are displayed) Labs Reviewed  CBC WITH DIFFERENTIAL/PLATELET - Abnormal; Notable for the following components:      Result Value   RBC 2.81 (*)    Hemoglobin 11.0 (*)    HCT 30.1 (*)    MCV 107.1 (*)    MCH 39.1 (*)    MCHC 36.5 (*)    Platelets 51 (*)    All other components within normal limits  BASIC METABOLIC PANEL - Abnormal; Notable for the following components:   Glucose, Bld 156 (*)    Creatinine, Ser 0.60 (*)    Calcium 8.4 (*)    All other components within normal limits  HEPATIC FUNCTION PANEL - Abnormal; Notable for the following components:   Albumin 2.8 (*)    AST 300 (*)    ALT 89 (*)    Total Bilirubin 4.8 (*)    Bilirubin, Direct 2.2 (*)    Indirect Bilirubin 2.6 (*)    All other components within normal limits  ETHANOL - Abnormal; Notable for the following components:   Alcohol, Ethyl (B) 250 (*)    All  other components within normal limits  RAPID URINE DRUG SCREEN, HOSP PERFORMED - Abnormal; Notable for the following components:   Benzodiazepines POSITIVE (*)    All other components within normal limits  URINALYSIS, ROUTINE W REFLEX MICROSCOPIC - Abnormal; Notable for the following components:   Color, Urine AMBER (*)    Bilirubin Urine SMALL (*)    Protein, ur 30 (*)    All other components within normal limits  LACTIC ACID, PLASMA - Abnormal; Notable for the following components:   Lactic Acid, Venous 2.5 (*)    All other components within normal limits  PROTIME-INR - Abnormal; Notable for the following components:   Prothrombin Time 18.9 (*)    INR 1.6 (*)    All other components within normal limits  AMMONIA - Abnormal; Notable for the following components:   Ammonia 49 (*)    All other components within normal limits  CULTURE, BLOOD (ROUTINE X 2)  CULTURE, BLOOD (ROUTINE X 2)  LIPASE, BLOOD  MAGNESIUM  BRAIN NATRIURETIC PEPTIDE  LACTIC ACID, PLASMA  TROPONIN I (HIGH SENSITIVITY)  TROPONIN I (HIGH SENSITIVITY)    EKG EKG Interpretation Date/Time:  Tuesday March 10 2024 04:05:59 EDT Ventricular Rate:  125 PR Interval:  147 QRS Duration:  92 QT Interval:  281 QTC Calculation: 406 R Axis:   101  Text Interpretation: Sinus tachycardia Probable left atrial enlargement Probable anterior infarct, age indeterminate Confirmed by Gilda Crease [phone]) on 03/10/2024 4:11:25 AM  Radiology DG Chest Port 1 View Result Date: 03/10/2024 CLINICAL DATA:  Shortness of breath and elevated heart rate EXAM: PORTABLE CHEST 1 VIEW COMPARISON:  09/11/2023 FINDINGS: Low volume chest with band of opacity at the lung bases and small volume pleural fluid. Normal heart size. No pneumothorax. IMPRESSION: Low volume chest with small volume pleural fluid and streaky lower lobe atelectasis. Electronically Signed   By: Tiburcio Pea M.D.   On: 03/10/2024 05:37    Procedures Procedures     Medications Ordered in ED Medications  cefTRIAXone (ROCEPHIN) 2 g in sodium chloride 0.9 % 100 mL IVPB (0 g Intravenous Stopped 03/10/24 0616)  iohexol (OMNIPAQUE) 300 MG/ML solution 100 mL (100 mLs Intravenous Contrast Given 03/10/24 0520)  HYDROmorphone (DILAUDID) injection 1 mg (1 mg Intravenous Given 03/10/24 0535)  ED Course/ Medical Decision Making/ A&P                                 Medical Decision Making Amount and/or Complexity of Data Reviewed Labs: ordered. Radiology: ordered.  Risk Prescription drug management.   Differential Diagnosis considered includes, but not limited to: Cholelithiasis; cholecystitis; cholangitis; bowel obstruction; esophagitis; gastritis; peptic ulcer disease; pancreatitis; ascites; spontaneous bacterial peritonitis  Patient with history of alcoholic cirrhosis presents to the emergency department for evaluation of abdominal pain.  Patient does have a history of recurrent ascites and has required paracentesis in the past.  Examination reveals generalized tenderness and distention of the abdomen.  SBP versus tense ascites most likely cause of his pain.  Ammonia slightly elevated at 49.  Lactic acid slightly elevated as well, likely secondary to chronic liver disease.  Administered Rocephin empirically for possible peritonitis.  With normal white blood cell count of 6.5.  CT scan performed.  Patient with large volume ascites, dilated gallbladder, no change from imaging during previous hospitalization.         Final Clinical Impression(s) / ED Diagnoses Final diagnoses:  Generalized abdominal pain  Ascites due to alcoholic cirrhosis [facility])    Rx / DC Orders ED Discharge Orders     None         Tasheema Perrone, Canary Brim, MD 03/10/24 0981    Gilda Crease, MD 03/10/24 [phone]

## 2024-03-10 NOTE — H&P (Signed)
 History and Physical    Gregory Murphy ZOX:[id number] DOB: 04-15-1986 DOA: 03/10/2024  PCP: Patient, [name]   Patient coming from: Home  Chief Complaint: Abdominal pain with distention  HPI: Gregory Murphy is a 38 y.o. male with medical history significant for alcohol abuse with associated alcoholic liver cirrhosis and ascites, TBI, depression/panic attacks, and severe thrombocytopenia who presented to the ED with worsening abdominal pain and distention along with shortness of breath over the last 1 day.  He was recently hospitalized 3/16 - 3/20 at [facility] with abdominal pain as well as alcohol withdrawal and decompensated cirrhosis with ascites.  He underwent paracentesis with 1 L of fluid removed and no SBP noted at that time.  He unfortunately left AGAINST MEDICAL ADVICE with no medications prescribed on discharge on 3/20.  He is currently complaining of being thirsty, but denies any chest pain, fevers, or chills.  He claims he has been drinking alcohol on a regular basis with 4-5 beers daily  ED Course: Vital signs with elevated heart rates, but stable blood pressures and no fever.  UDS positive for benzodiazepines and alcohol level 250.  Hemoglobin 11 and platelets of 51,000 with INR 1.6.  Noted findings of cirrhosis with large volume ascites on CT scan.  He has been started on Dilaudid for pain management as well as Rocephin.  He is currently having some tremors.  Review of Systems: Reviewed as noted above, otherwise negative.  Past Medical History:  Diagnosis Date   Brain injury (HCC)    Depression    Insomnia    Panic attack     Past Surgical History:  Procedure Laterality Date   ABDOMINAL SURGERY     CLOSED REDUCTION FINGER WITH PERCUTANEOUS PINNING Left 02/04/2024   Procedure: LEFT THUMB CLOSED REDUCTION FINGER WITH PERCUTANEOUS PINNing;  Surgeon: Samuella Cota, MD;  Location: [facility];  Service: Orthopedics;  Laterality: Left;   DISTAL  FEMUR BONE BRIDGE EXCISION Left    FEMUR CLOSED REDUCTION Left 12/17/2021   Procedure: CLOSED REDUCTION FEMORAL SHAFT;  Surgeon: [name], MD;  Location: [facility] OR;  Service: Orthopedics;  Laterality: Left;   FOREARM SURGERY Left    x4-metal rods placed   I & D EXTREMITY Left 02/04/2024   Procedure: IRRIGATION AND DEBRIDEMENT OF LEFT THUMB;  Surgeon: Samuella Cota, MD;  Location: [facility];  Service: Orthopedics;  Laterality: Left;   IR PARACENTESIS  03/02/2024   LEG SURGERY Right 2023   NAILBED REPAIR Left 02/04/2024   Procedure: LEFT THUMB NAILBED REPAIR;  Surgeon: Samuella Cota, MD;  Location: [facility];  Service: Orthopedics;  Laterality: Left;   ORIF FEMUR FRACTURE Left 12/20/2021   Procedure: OPEN REDUCTION INTERNAL FIXATION (ORIF) DISTAL FEMUR FRACTURE;  Surgeon: Roby Lofts, MD;  Location: [facility] OR;  Service: Orthopedics;  Laterality: Left;   TIBIA IM NAIL INSERTION Bilateral 12/17/2021   Procedure: RIGHT INTRAMEDULLARY (IM) NAIL TIBIAL; RIGHT CLOSED TREATMENT OF FIBULA FRACTURE; LEFT CLOSED REDUCTION SPLINTING OF PERIPROSTHETIC  SUPRACONDYLAR FEMUR FRACTURE;  Surgeon: [name], MD;  Location: [facility] OR;  Service: Orthopedics;  Laterality: Bilateral;   WRIST ARTHROSCOPY WITH CARPOMETACARPEL [facility]) ARTHROPLASTY Left 03/21/2022   Procedure: left wrist arthroscopy with debridement as needed;  Surgeon: Dairl Ponder, MD;  Location: [facility];  Service: Orthopedics;  Laterality: Left;  just needs 60 mins for surgery   WRIST SURGERY Right    pins     reports that he has been smoking  cigarettes. He has a 7 pack-year smoking history. He has never used smokeless tobacco. He reports that he does not currently use alcohol after a past usage of about 28.0 standard drinks of alcohol per week. He reports that he does not currently use drugs.  Allergies  Allergen Reactions   Latex Itching    History reviewed. No pertinent family  history.  Prior to Admission medications   Medication Sig Start Date End Date Taking? Authorizing Provider  vitamin B-12 (CYANOCOBALAMIN) 500 MCG tablet Take 500 mcg by mouth daily.   Yes [provider]    Physical Exam: Vitals:   03/10/24 0715 03/10/24 0745 03/10/24 0800 03/10/24 0857  BP: (!) 131/97 (!) 127/99 131/85 (!) 127/99  Pulse: (!) 118 (!) 112 (!) 106 (!) 107  Resp: (!) 8 12 10 16   Temp:    98.1 F (36.7 C)  TempSrc:    Oral  SpO2: 92% 95% 93% 96%  Weight:    55.4 kg  Height:    5\' 1"  (1.549 m)    Constitutional: NAD, calm, comfortable Vitals:   03/10/24 0715 03/10/24 0745 03/10/24 0800 03/10/24 0857  BP: (!) 131/97 (!) 127/99 131/85 (!) 127/99  Pulse: (!) 118 (!) 112 (!) 106 (!) 107  Resp: (!) 8 12 10 16   Temp:    98.1 F (36.7 C)  TempSrc:    Oral  SpO2: 92% 95% 93% 96%  Weight:    55.4 kg  Height:    5\' 1"  (1.549 m)   Eyes: lids and conjunctivae normal Neck: normal, supple Respiratory: clear to auscultation bilaterally. Normal respiratory effort. No accessory muscle use.  1 L nasal cannula Cardiovascular: Regular rate and rhythm, no murmurs. Abdomen: no tenderness, moderate distention noted. Bowel sounds positive.  Musculoskeletal:  No edema. Skin: no rashes, lesions, ulcers.  Psychiatric: Flat affect  Labs on Admission: I have personally reviewed following labs and imaging studies  CBC: Recent Labs  Lab 03/04/24 0453 03/04/24 0937 03/05/24 0519 03/10/24 0414  WBC 4.2 4.0 3.9* 6.5  NEUTROABS  --   --   --  3.0  HGB 8.2* 8.4* 8.0* 11.0*  HCT 22.8* 23.4* 22.9* 30.1*  MCV 109.6* 108.8* 109.0* 107.1*  PLT 54* 54* 44* 51*   Basic Metabolic Panel: Recent Labs  Lab 03/04/24 0453 03/05/24 0519 03/10/24 0414  NA 132* 132* 135  K 3.6 3.7 4.0  CL 96* 98 98  CO2 28 25 25   GLUCOSE 91 81 156*  BUN 6 8 7   CREATININE 0.74 0.82 0.60*  CALCIUM 8.1* 8.1* 8.4*  MG  --  1.7 2.1   GFR: Estimated Creatinine Clearance: 92.6 mL/min (A) (by C-G  formula based on SCr of 0.6 mg/dL (L)). Liver Function Tests: Recent Labs  Lab 03/04/24 0453 03/05/24 0519 03/10/24 0414  AST 134* 136* 300*  ALT 42 46* 89*  ALKPHOS 61 66 110  BILITOT 6.1* 5.0* 4.8*  PROT 5.4* 5.2* 7.2  ALBUMIN 2.3* 2.2* 2.8*   Recent Labs  Lab 03/10/24 0414  LIPASE 46   Recent Labs  Lab 03/10/24 0515  AMMONIA 49*   Coagulation Profile: Recent Labs  Lab 03/10/24 0414  INR 1.6*   Cardiac Enzymes: No results for input(s): "CKTOTAL", "CKMB", "CKMBINDEX", "TROPONINI" in the last 168 hours. BNP (last 3 results) No results for input(s): "PROBNP" in the last 8760 hours. HbA1C: No results for input(s): "HGBA1C" in the last 72 hours. CBG: No results for input(s): "GLUCAP" in the last 168 hours. Lipid Profile:  No results for input(s): "CHOL", "HDL", "LDLCALC", "TRIG", "CHOLHDL", "LDLDIRECT" in the last 72 hours. Thyroid Function Tests: No results for input(s): "TSH", "T4TOTAL", "FREET4", "T3FREE", "THYROIDAB" in the last 72 hours. Anemia Panel: No results for input(s): "VITAMINB12", "FOLATE", "FERRITIN", "TIBC", "IRON", "RETICCTPCT" in the last 72 hours. Urine analysis:    Component Value Date/Time   COLORURINE AMBER (A) 03/10/2024 0426   APPEARANCEUR CLEAR 03/10/2024 0426   LABSPEC 1.030 03/10/2024 0426   PHURINE 5.0 03/10/2024 0426   GLUCOSEU NEGATIVE 03/10/2024 0426   HGBUR NEGATIVE 03/10/2024 0426   BILIRUBINUR SMALL (A) 03/10/2024 0426   KETONESUR NEGATIVE 03/10/2024 0426   PROTEINUR 30 (A) 03/10/2024 0426   NITRITE NEGATIVE 03/10/2024 0426   LEUKOCYTESUR NEGATIVE 03/10/2024 0426    Radiological Exams on Admission: CT ABDOMEN PELVIS W CONTRAST Result Date: 03/10/2024 CLINICAL DATA:  Acute, nonlocalized abdominal pain EXAM: CT ABDOMEN AND PELVIS WITH CONTRAST TECHNIQUE: Multidetector CT imaging of the abdomen and pelvis was performed using the standard protocol following bolus administration of intravenous contrast. RADIATION DOSE REDUCTION:  This exam was performed according to the departmental dose-optimization program which includes automated exposure control, adjustment of the mA and/or kV according to patient size and/or use of iterative reconstruction technique. CONTRAST:  OMNIPAQUE IOHEXOL 300 MG/ML  SOLN COMPARISON:  Abdominal MRI 03/04/2024 FINDINGS: Lower chest: Small pleural effusions with lower lobe atelectasis. Premature atheromatous calcification of the coronaries. Lower periesophageal varices. Hepatobiliary: Cirrhosis and steatosis. Heterogeneous density of the liver recently evaluated by MRI.Cholelithiasis with very distended gallbladder but no focal wall thickening is seen. Multiple peripheral calcifications favoring adherent stone over mural calcification given eccentric positioning relative to the wall. No bile duct dilatation or detected calculus. Pancreas: Unremarkable. Spleen: Unremarkable. Adrenals/Urinary Tract: Negative adrenals. No hydronephrosis or stone. Unremarkable bladder. Stomach/Bowel: Submucosal low-density thickening of the proximal colon, likely portal colopathy. No bowel obstruction. Vascular/Lymphatic: Premature atheromatous calcification of the aorta and iliacs for age. No mass or adenopathy. Reproductive:No pathologic findings. Other: Large volume ascites which is low-density and seen throughout the abdomen. Musculoskeletal: No acute abnormalities. IMPRESSION: 1. No acute finding or change from 03/04/2024 MRI. 2. Cirrhosis with large volume ascites. 3. Cholelithiasis with very dilated gallbladder. 4. Small pleural effusions with atelectasis at the lung bases. Electronically Signed   By: Tiburcio Pea M.D.   On: 03/10/2024 06:54   DG Chest Port 1 View Result Date: 03/10/2024 CLINICAL DATA:  Shortness of breath and elevated heart rate EXAM: PORTABLE CHEST 1 VIEW COMPARISON:  09/11/2023 FINDINGS: Low volume chest with band of opacity at the lung bases and small volume pleural fluid. Normal heart size. No  pneumothorax. IMPRESSION: Low volume chest with small volume pleural fluid and streaky lower lobe atelectasis. Electronically Signed   By: Tiburcio Pea M.D.   On: 03/10/2024 05:37    EKG: Independently reviewed.  ST 125 bpm.  Assessment/Plan Principal Problem:   Decompensated cirrhosis (HCC) Active Problems:   Alcoholic cirrhosis of liver with ascites (HCC)   Alcohol withdrawal (HCC)   Alcoholic gastritis   Severe thrombocytopenia (HCC)   Tobacco abuse    Decompensated alcoholic liver cirrhosis with concern for SBP -Will need paracentesis which can be done 3/26 -Appreciate GI recommendations -Continue Rocephin empirically -Dilaudid as needed for pain management  Alcohol abuse with withdrawal -CIWA protocol with close monitoring -Counseled on cessation  Tobacco abuse -Counseled on cessation  Chronic macrocytic anemia-stable -Likely related to folate deficiency as well as bone marrow suppression -Continue supplements  Severe thrombocytopenia -Secondary to alcohol use -  Had recent platelet transfusion during hospitalization at [facility], reconsider as needed -Avoid heparin/antiplatelet agents -Monitor CBC  Alcoholic gastritis -PPI  DVT prophylaxis: SCDs Code Status: Full Family Communication: None at bedside Disposition Plan: Admit for GI evaluation and need for paracentesis Consults called: GI Admission status: Inpatient, telemetry  Severity of Illness: The appropriate patient status for this patient is INPATIENT. Inpatient status is judged to be reasonable and necessary in order to provide the required intensity of service to ensure the patient's safety. [name] presenting symptoms, physical exam findings, and initial radiographic and laboratory data in the context of their chronic comorbidities is felt to place them at high risk for further clinical deterioration. Furthermore, it is not anticipated that the patient will be medically stable for discharge from  the hospital within 2 midnights of admission.   * I certify that at the point of admission it is my clinical judgment that the patient will require inpatient hospital care spanning beyond 2 midnights from the point of admission due to high intensity of service, high risk for further deterioration and high frequency of surveillance required.*   Maxtyn Nuzum D Maylene Crocker DO [facility]  If 7PM-7AM, please contact night-coverage [email or web address]  03/10/2024, 9:59 AM

## 2024-03-10 NOTE — Progress Notes (Signed)
   03/10/24 1712  Readmission Prevention Plan - Moderate Risk  Transportation Screening Complete (Friends or family will transport.)  PCP or Specialist appointment within 5-7 days of discharge  (Pt to follow-up c/[facility].)  Home Care Screening Complete  Moderate Risk Medication Review Complete   Pt s/insurance or PCP, admits to actively drinking prior to admission. Instructed pt and roommate, c/pt's permission, on services provided by [facility], eligibility requirements and provided written information for pt to follow-up. Pt expressed interest in local AA meetings. Printed schedule of local meetings given to pt and added to AVS. TOC to follow.

## 2024-03-10 NOTE — Consult Note (Signed)
 @LOGO @   Referring Provider: [facility]  Primary Care Physician:  Patient, [name] Primary GI: [facility]   Date of Admission: 03/10/24 Date of Consultation: 03/10/24  Reason for Consultation:  Decompensated cirrhosis with ascites, concern for SBP  HPI:  Zyquan Crotty is a 38 y.o. year old male with history of alcohol abuse, TBI, depression, panic attacks, cholelithiasis, decompensated cirrhosis complicated portal hypertension with ascites, varices per imaging, coagulopathy, thrombocytopenia, who presented to the emergency room early this morning with chief complaint of abdominal pain with abdominal distention and shortness of breath x 1 day.  Patient was recent hospitalized 3/16-3/20 at [facility] after presenting with abdominal pain, nausea, vomiting, alcohol withdrawal, decompensated cirrhosis with ascites and likely component of alcoholic hepatitis.  Steroids not recommended for alcoholic hepatitis due to concerns about compliance, safety taken steroids, risk of infection in the setting of cirrhosis.  He underwent paracentesis 3/17 with 1 L removed, no SBP though plan was to complete 5 days of empiric treatment with of Rocephin. He was also started on Lasix 20 mg and Aldactone 50 mg on 3/19. Due to severe thrombocytopenia, platelets 18, he received platelet transfusion. On 3/20, patient left AGAINST MEDICAL ADVICE, thus no medications prescribed at discharge.   Cirrhosis workup completed during last admission: ANA positive, ASMA negative, AMA negative, hepatitis A antibody total positive, hepatitis B surface antigen, surface antibody, and core antibody total all negative, hepatitis C antibody positive but hepatitis C RNA negative. AFP elevated at 7.3. MRI liver with and without contrast 3/19 showing cirrhosis, profound hepatic steatosis, no suspicious liver lesion, moderate volume ascites, small gastroesophageal varices, thickening of the cecum and ascending colon likely  reflecting portal colopathy, descending colon are containing tiny stones, gallbladder wall thickening and pericholecystic fluid, nonspecific in the setting of ascites.   Current ED Course:  Initial vital signs with elevated BP of 148/103, heart rate 119, otherwise vital signs within normal limits.  BP normalized, but patient remained tachycardic.  Labs remarkable for: Hemoglobin 11.0, platelets 51, calcium 8.4, AST 300, ALT 89, total bilirubin 4.8, direct bilirubin 2.2, indirect bilirubin 2.6, alcohol level 250, lactic acid 2.5, INR 1.6, ammonia 49. UDS positive for benzodiazepines. Troponins negative.  Chest x-ray showed small volume pleural fluid and streaky lower lobe atelectasis.  CT A/P showing cirrhosis with large volume ascites, heterogenous density of the liver recently evaluated by MRI without significant change, cholelithiasis with distended gallbladder, small pleural effusions with atelectasis at the lung bases, submucosal low-density thickening of the proximal colon likely portal colopathy.  He was given an empiric dose of Rocephin in the ER as well as Dilaudid.     Consult:  Abdominal pain started 2-3 days ago when his stomach started to become more distended. Pain is generalized. Associated nausea and vomiting. Last vomited 2 days ago. No nausea today. Eating normally. States eating isn't a problem. Sometimes abdominal pain after eating, but not routinely. States symptoms are similar to last admission with symptoms improving after paracentesis.    No LE edema.   Some SOB. No CP.    MELD 3.0:  20 on 03/10/24 EGD:  Never BB: No Diuretics: None  History of SBP:  No Encephalopathy:  None  No brbpr or melena. Bowels moving normally, 1 per day. Occasional heartburn, but nothing routine. No dysphagia or hematemesis.    NSAIDs:  None.  ETOH: 4-5 beer daily. States he has naltrexone at home, but hasn't taken it.  Illicit drug use: Used to use heroin.  Last used 5 years ago.   Tobacco: 1 pack per 2 days.   Lives "with friends".   Past Medical History:  Diagnosis Date   Brain injury (HCC)    Depression    Insomnia    Panic attack     Past Surgical History:  Procedure Laterality Date   ABDOMINAL SURGERY     CLOSED REDUCTION FINGER WITH PERCUTANEOUS PINNING Left 02/04/2024   Procedure: LEFT THUMB CLOSED REDUCTION FINGER WITH PERCUTANEOUS PINNing;  Surgeon: Samuella Cota, MD;  Location: [facility];  Service: Orthopedics;  Laterality: Left;   DISTAL FEMUR BONE BRIDGE EXCISION Left    FEMUR CLOSED REDUCTION Left 12/17/2021   Procedure: CLOSED REDUCTION FEMORAL SHAFT;  Surgeon: [name], MD;  Location: [facility] OR;  Service: Orthopedics;  Laterality: Left;   FOREARM SURGERY Left    x4-metal rods placed   I & D EXTREMITY Left 02/04/2024   Procedure: IRRIGATION AND DEBRIDEMENT OF LEFT THUMB;  Surgeon: Samuella Cota, MD;  Location: [facility];  Service: Orthopedics;  Laterality: Left;   IR PARACENTESIS  03/02/2024   LEG SURGERY Right 2023   NAILBED REPAIR Left 02/04/2024   Procedure: LEFT THUMB NAILBED REPAIR;  Surgeon: Samuella Cota, MD;  Location:  SURGERY CENTER;  Service: Orthopedics;  Laterality: Left;   ORIF FEMUR FRACTURE Left 12/20/2021   Procedure: OPEN REDUCTION INTERNAL FIXATION (ORIF) DISTAL FEMUR FRACTURE;  Surgeon: Roby Lofts, MD;  Location: [facility] OR;  Service: Orthopedics;  Laterality: Left;   TIBIA IM NAIL INSERTION Bilateral 12/17/2021   Procedure: RIGHT INTRAMEDULLARY (IM) NAIL TIBIAL; RIGHT CLOSED TREATMENT OF FIBULA FRACTURE; LEFT CLOSED REDUCTION SPLINTING OF PERIPROSTHETIC  SUPRACONDYLAR FEMUR FRACTURE;  Surgeon: [name], MD;  Location: [facility] OR;  Service: Orthopedics;  Laterality: Bilateral;   WRIST ARTHROSCOPY WITH CARPOMETACARPEL [facility]) ARTHROPLASTY Left 03/21/2022   Procedure: left wrist arthroscopy with debridement as needed;  Surgeon: Dairl Ponder, MD;  Location:   SURGERY CENTER;  Service: Orthopedics;  Laterality: Left;  just needs 60 mins for surgery   WRIST SURGERY Right    pins    Prior to Admission medications   Medication Sig Start Date End Date Taking? Authorizing Provider  vitamin B-12 (CYANOCOBALAMIN) 500 MCG tablet Take 500 mcg by mouth daily.   Yes [provider]    No current facility-administered medications for this encounter.   Current Outpatient Medications  Medication Sig Dispense Refill   vitamin B-12 (CYANOCOBALAMIN) 500 MCG tablet Take 500 mcg by mouth daily.      Allergies as of 03/10/2024 - Review Complete 03/10/2024  Allergen Reaction Noted   Latex Itching 03/10/2024    History reviewed. No pertinent family history.  Social History   Socioeconomic History   Marital status: Single    Spouse name: Not on file   Number of children: Not on file   Years of education: Not on file   Highest education level: Not on file  Occupational History   Not on file  Tobacco Use   Smoking status: Every Day    Current packs/day: 0.50    Average packs/day: 0.5 packs/day for 14.0 years (7.0 ttl pk-yrs)    Types: Cigarettes   Smokeless tobacco: Never  Vaping Use   Vaping status: Some Days  Substance and Sexual Activity   Alcohol use: Not Currently    Alcohol/week: 28.0 standard drinks of alcohol    Types: 28 Cans of beer per week    Comment: admits to drinking today 4-5  Drug use: Not Currently    Comment: "clean for 5 years"   Sexual activity: Yes    Birth control/protection: Condom  Other Topics Concern   Not on file  Social History Narrative   Not on file   Social Drivers of Health   Financial Resource Strain: Not on file  Food Insecurity: No Food Insecurity (03/02/2024)   Hunger Vital Sign    Worried About Running Out of Food in the Last Year: Never true    Ran Out of Food in the Last Year: Never true  Transportation Needs: No Transportation Needs (03/03/2024)   PRAPARE - Scientist, research (physical sciences) (Medical): No    Lack of Transportation (Non-Medical): No  Physical Activity: Not on file  Stress: Not on file  Social Connections: Not on file  Intimate Partner Violence: Not At Risk (03/03/2024)   Humiliation, Afraid, Rape, and Kick questionnaire    Fear of Current or Ex-Partner: No    Emotionally Abused: No    Physically Abused: No    Sexually Abused: No    Review of Systems: Gen: Denies fever, chills, cold or flu like symptoms, pre-syncope, or syncope.  CV: Denies chest pain.  Resp: Admits to shortness of breath.  GI: See HPI GU : Denies urinary burning, urinary frequency, urinary incontinence.  MS: Denies joint pain. Derm: Denies rash. Psych: Denies depression, anxiety. Heme: See HPI  Physical Exam: Vital signs in last 24 hours: Temp:  [98.7 F (37.1 C)] 98.7 F (37.1 C) (03/25 0402) Pulse Rate:  [106-126] 106 (03/25 0800) Resp:  [8-20] 10 (03/25 0800) BP: (121-148)/(85-103) 131/85 (03/25 0800) SpO2:  [92 %-98 %] 93 % (03/25 0800) Weight:  [50 kg] 50 kg (03/25 0400)   General:   Alert, thin, NAD, pleasant and cooperative.  Head:  Normocephalic and atraumatic. Eyes:  + scleral icterus.   Conjunctiva pink. Ears:  Normal auditory acuity. Nose:  No deformity, discharge,  or lesions. Lungs:  Clear throughout to auscultation.  Rhonchi present.  No acute distress.  Heart:  Tachycardic, regular rhythm; no murmurs, clicks, rubs,  or gallops. Abdomen:  Distended and tense. Very mild generalized tenderness without rebound or guarding. No masses. Normal bowel sounds.    Rectal:  Deferred  Msk:  Symmetrical without gross deformities. Normal posture. Extremities:  Without trace pitting edema. Neurologic:  Alert and  oriented x4;  grossly normal neurologically. No asterixis.  Skin:  Intact without significant lesions or rashes. Psych:  Normal mood and affect.  Intake/Output from previous day: 03/24 0701 - 03/25 0700 In: 100 [IV Piggyback:100] Out: -   Intake/Output this shift: No intake/output data recorded.  Lab Results: Recent Labs    03/10/24 0414  WBC 6.5  HGB 11.0*  HCT 30.1*  PLT 51*   BMET Recent Labs    03/10/24 0414  NA 135  K 4.0  CL 98  CO2 25  GLUCOSE 156*  BUN 7  CREATININE 0.60*  CALCIUM 8.4*   LFT Recent Labs    03/10/24 0414  PROT 7.2  ALBUMIN 2.8*  AST 300*  ALT 89*  ALKPHOS 110  BILITOT 4.8*  BILIDIR 2.2*  IBILI 2.6*   PT/INR Recent Labs    03/10/24 0414  LABPROT 18.9*  INR 1.6*   Hepatitis Panel No results for input(s): "HEPBSAG", "HCVAB", "HEPAIGM", "HEPBIGM" in the last 72 hours. C-Diff No results for input(s): "CDIFFTOX" in the last 72 hours.  Studies/Results: CT ABDOMEN PELVIS W CONTRAST Result Date: 03/10/2024 CLINICAL  DATA:  Acute, nonlocalized abdominal pain EXAM: CT ABDOMEN AND PELVIS WITH CONTRAST TECHNIQUE: Multidetector CT imaging of the abdomen and pelvis was performed using the standard protocol following bolus administration of intravenous contrast. RADIATION DOSE REDUCTION: This exam was performed according to the departmental dose-optimization program which includes automated exposure control, adjustment of the mA and/or kV according to patient size and/or use of iterative reconstruction technique. CONTRAST:  OMNIPAQUE IOHEXOL 300 MG/ML  SOLN COMPARISON:  Abdominal MRI 03/04/2024 FINDINGS: Lower chest: Small pleural effusions with lower lobe atelectasis. Premature atheromatous calcification of the coronaries. Lower periesophageal varices. Hepatobiliary: Cirrhosis and steatosis. Heterogeneous density of the liver recently evaluated by MRI.Cholelithiasis with very distended gallbladder but no focal wall thickening is seen. Multiple peripheral calcifications favoring adherent stone over mural calcification given eccentric positioning relative to the wall. No bile duct dilatation or detected calculus. Pancreas: Unremarkable. Spleen: Unremarkable. Adrenals/Urinary Tract:  Negative adrenals. No hydronephrosis or stone. Unremarkable bladder. Stomach/Bowel: Submucosal low-density thickening of the proximal colon, likely portal colopathy. No bowel obstruction. Vascular/Lymphatic: Premature atheromatous calcification of the aorta and iliacs for age. No mass or adenopathy. Reproductive:No pathologic findings. Other: Large volume ascites which is low-density and seen throughout the abdomen. Musculoskeletal: No acute abnormalities. IMPRESSION: 1. No acute finding or change from 03/04/2024 MRI. 2. Cirrhosis with large volume ascites. 3. Cholelithiasis with very dilated gallbladder. 4. Small pleural effusions with atelectasis at the lung bases. Electronically Signed   By: Tiburcio Pea M.D.   On: 03/10/2024 06:54   DG Chest Port 1 View Result Date: 03/10/2024 CLINICAL DATA:  Shortness of breath and elevated heart rate EXAM: PORTABLE CHEST 1 VIEW COMPARISON:  09/11/2023 FINDINGS: Low volume chest with band of opacity at the lung bases and small volume pleural fluid. Normal heart size. No pneumothorax. IMPRESSION: Low volume chest with small volume pleural fluid and streaky lower lobe atelectasis. Electronically Signed   By: Tiburcio Pea M.D.   On: 03/10/2024 05:37    Impression: 38 year old male with history of alcohol abuse, TBI, depression, panic attacks, cholelithiasis, decompensated cirrhosis likely secondary to alcohol, recent hospitalization earlier this month with abdominal pain, nausea, vomiting treated empirically for SBP and underwent paracentesis yielding 1 L with no SBP on fluid analysis, also with alcoholic hepatitis during that admission treated supportively, now presenting to the ER today with complaint of abdominal pain and shortness of breath.  Abdominal pain:  Recurrent generalized abdominal pain likely secondary to large volume ascites noted on CT A/P this admission. Recent paracentesis on 3/17 yielding 1L with no SBP but completed 4 days of empiric SBP  treatment with IV rocephin (patient left AMA on day 4). In the setting of ascites and pain, I can't rule out SBP though feel it is less likely in the setting of normal WBC count. None the less, he will need paracentesis for therapeutic and diagnostic purposes. As we do not have radiology here today, this will have to be postponed until tomorrow. Considering this, I agree with empiric SBP treatment for now (already received a dose of rocephon in the ER). He also has evidence of cholelithiasis with distended gallbladder, but this is a stable finding and I doubt this is the etiology of his pain and he has no significant RUQ tenderness and doesn't routinely have pain related to meals. However, if pain doesn't improve following paracentesis, would need to consider biliary etiology.   Cirrhosis:  Decompensated with portal hypertension, ascites, varices per imaging, coagulopathy, thrombocytopenia.  Etiology likely secondary to alcohol.  Recent serologic workup showed positive ANA, but negative ASMA and AMA.  Viral hepatitis labs showed positive hep C antibody, but negative hep C RNA.  aFP also recently elevated at 7.3, but MRI liver protocol with no suspicious liver lesion.   Due to ascites, patient underwent paracentesis on 3/17 yielding 1 L.  Unfortunately, patient left the hospital AGAINST MEDICAL ADVICE and was not discharged on any diuretics, now with recurrent large-volume ascites.  He will need repeat paracentesis for diagnostic and therapeutic purposes and will also need to be started on diuretics.  His ammonia levels are mildly elevated, if he has no evidence of hepatic encephalopathy and is having normal bowel movements daily.  Regarding evidence of varices on imaging, he will need to have an upper endoscopy in the near future in the outpatient setting.    Considering portal hypertension with ascites and varices, would consider going ahead and starting nonselective beta-blocker.  Patient developed  acute on chronic thrombocytopenia during recent admission with platelets down to 18 on 3/18 s/p platelet transfusion.  Platelets have been stable in the 50 range since that time, 54 today.  Thrombocytopenia likely multifactorial in setting of cirrhosis and chronic alcohol use.    MELD 3.0 is 20 today.   Alcoholic Hepatitis:  Recently inpatient with alcoholic hepatitis 3/16 - 3/20.  No recommendations for steroids due to concerns for compliance as well as infection risk in the setting of cirrhosis. DF was 78.2 using PT control 13 on 3/18. DF today 31.9.  Unfortunately, he has continued to drink alcohol on a daily basis since his discharge.  LFTs consistent with this. He was counseled extensively on this today.  No indication for steroids at this time.   Anemia: Chronic. Hgb stable/improved on admission at 11.0.  Recently found to have folate deficiency with folate 4.1 on 03/02/2024 though not currently taking any supplementation.  He does take B12 daily which is adequately replaced.  No iron levels on file.  Denies overt GI bleeding.  No prior EGD or colonoscopy. Will check iron panel and monitor.     Plan: Iron panel, immunoglobulins. US paracentesis, MAX 6 L, with diagnostic labs.  Agree with empiric Rocephin daily until SBP ruled out Lasix 40 mg and spironolactone 100 mg daily.  2g sodium diet restriction.  Monitor CBC, CMP, INR daily.  Monitor for overt GI bleeding.  Monitor for development of HE.  CIWA protocol Folic acid 1 mg daily.  Thiamine daily.  EGD outpatient with primary GI ([place]) Consider starting NSBB. Will discuss with Dr. Levon Hedger.    LOS: 0 days    03/10/2024, 8:22 AM   Ermalinda Memos, [facility]

## 2024-03-10 NOTE — TOC Progression Note (Signed)
 Transition of Care [facility]) - Progression Note    Patient Details  Name: Gregory Murphy MRN: [id number] Date of Birth: 1985/12/25  [facility] CM/SW Contact  Barron Alvine, RN Phone Number: 03/10/2024, 3:25 PM  Clinical Narrative:     [facility] consulted for insurance assistance and substance abuse. Spoke c/pt and one of his roommates c/pt permission. Instructed pt on [facility], the services provided, eligibility requirements and how to contact. Information card given to pt. Also spoke c/pt about rehab. Pt states he was regularly attending an outpatient alcohol intervention group in [place] when he lost his job and insurance.  He states he is open to attending a local AA meeting. AA district meeting schedule printed for pt and attached to AVS.   Pt currently living c/friends across the street from his mother and 62 year-old daughter.        Expected Discharge Plan and Services                                               Social Determinants of Health (SDOH) Interventions SDOH Screenings   Food Insecurity: No Food Insecurity (03/10/2024)  Housing: Low Risk  (03/10/2024)  Recent Concern: Housing - High Risk (03/03/2024)  Transportation Needs: No Transportation Needs (03/10/2024)  Utilities: Not At Risk (03/10/2024)  Tobacco Use: High Risk (03/10/2024)    Readmission Risk Interventions     No data to display

## 2024-03-10 NOTE — Plan of Care (Signed)
  Problem: Education: Goal: Knowledge of General Education information will improve Description: Including pain rating scale, medication(s)/side effects and non-pharmacologic comfort measures Outcome: Progressing   Problem: Clinical Measurements: Goal: Respiratory complications will improve Outcome: Progressing   Problem: Pain Managment: Goal: General experience of comfort will improve and/or be controlled Outcome: Progressing   Problem: Safety: Goal: Ability to remain free from injury will improve Outcome: Progressing

## 2024-03-11 ENCOUNTER — Inpatient Hospital Stay (HOSPITAL_COMMUNITY): Payer: MEDICAID

## 2024-03-11 ENCOUNTER — Encounter (HOSPITAL_COMMUNITY): Payer: Self-pay | Admitting: Internal Medicine

## 2024-03-11 DIAGNOSIS — F10930 Alcohol use, unspecified with withdrawal, uncomplicated: Secondary | ICD-10-CM

## 2024-03-11 DIAGNOSIS — D696 Thrombocytopenia, unspecified: Secondary | ICD-10-CM

## 2024-03-11 DIAGNOSIS — Z720 Tobacco use: Secondary | ICD-10-CM

## 2024-03-11 DIAGNOSIS — R1084 Generalized abdominal pain: Secondary | ICD-10-CM

## 2024-03-11 DIAGNOSIS — K2920 Alcoholic gastritis without bleeding: Secondary | ICD-10-CM

## 2024-03-11 DIAGNOSIS — K709 Alcoholic liver disease, unspecified: Secondary | ICD-10-CM

## 2024-03-11 LAB — CBC
HCT: 26.2 % — ABNORMAL LOW (ref 39.0–52.0)
Hemoglobin: 8.9 g/dL — ABNORMAL LOW (ref 13.0–17.0)
MCH: 37.9 pg — ABNORMAL HIGH (ref 26.0–34.0)
MCHC: 34 g/dL (ref 30.0–36.0)
MCV: 111.5 fL — ABNORMAL HIGH (ref 80.0–100.0)
Platelets: 36 10*3/uL — ABNORMAL LOW (ref 150–400)
RBC: 2.35 MIL/uL — ABNORMAL LOW (ref 4.22–5.81)
RDW: 14.7 % (ref 11.5–15.5)
WBC: 5.5 10*3/uL (ref 4.0–10.5)
nRBC: 0 % (ref 0.0–0.2)

## 2024-03-11 LAB — MAGNESIUM: Magnesium: 1.7 mg/dL (ref 1.7–2.4)

## 2024-03-11 LAB — COMPREHENSIVE METABOLIC PANEL
ALT: 70 U/L — ABNORMAL HIGH (ref 0–44)
AST: 209 U/L — ABNORMAL HIGH (ref 15–41)
Albumin: 2.3 g/dL — ABNORMAL LOW (ref 3.5–5.0)
Alkaline Phosphatase: 81 U/L (ref 38–126)
Anion gap: 6 (ref 5–15)
BUN: 8 mg/dL (ref 6–20)
CO2: 32 mmol/L (ref 22–32)
Calcium: 8.3 mg/dL — ABNORMAL LOW (ref 8.9–10.3)
Chloride: 94 mmol/L — ABNORMAL LOW (ref 98–111)
Creatinine, Ser: 0.75 mg/dL (ref 0.61–1.24)
GFR, Estimated: >60 mL/min (ref >60)
Glucose, Bld: 109 mg/dL — ABNORMAL HIGH (ref 70–99)
Potassium: 3.6 mmol/L (ref 3.5–5.1)
Sodium: 132 mmol/L — ABNORMAL LOW (ref 135–145)
Total Bilirubin: 5.3 mg/dL — ABNORMAL HIGH (ref 0.0–1.2)
Total Protein: 6 g/dL — ABNORMAL LOW (ref 6.5–8.1)

## 2024-03-11 LAB — BODY FLUID CELL COUNT WITH DIFFERENTIAL
Eos, Fluid: 0 %
Lymphs, Fluid: 23 %
Monocyte-Macrophage-Serous Fluid: 77 % (ref 50–90)
Neutrophil Count, Fluid: 0 % (ref 0–25)
Total Nucleated Cell Count, Fluid: 82 uL (ref 0–1000)

## 2024-03-11 LAB — IRON AND TIBC
Iron: 133 ug/dL (ref 45–182)
Saturation Ratios: 101 % — ABNORMAL HIGH (ref 17.9–39.5)
TIBC: 131 ug/dL — ABNORMAL LOW (ref 250–450)

## 2024-03-11 LAB — AMMONIA: Ammonia: 75 umol/L — ABNORMAL HIGH (ref 9–35)

## 2024-03-11 LAB — FERRITIN: Ferritin: 1152 ng/mL — ABNORMAL HIGH (ref 24–336)

## 2024-03-11 LAB — PROTIME-INR
INR: 1.8 — ABNORMAL HIGH (ref 0.8–1.2)
Prothrombin Time: 21.4 s — ABNORMAL HIGH (ref 11.4–15.2)

## 2024-03-11 LAB — GRAM STAIN

## 2024-03-11 MED ORDER — ALBUMIN HUMAN 25 % IV SOLN
25.0000 g | Freq: Once | INTRAVENOUS | Status: AC
  Administered 2024-03-11: 25 g via INTRAVENOUS

## 2024-03-11 MED ORDER — ALBUMIN HUMAN 25 % IV SOLN
INTRAVENOUS | Status: AC
  Filled 2024-03-11: qty 100

## 2024-03-11 NOTE — Progress Notes (Signed)
 Pt complained he was unable to void. Bladder scan showed > 1000 ml. I got an order for I&O cath Over 800 ml removed. The bladder scan showed > 400 ml remained. I attempted to drain more urine with the catheter but was unsuccessful. Pt state he felt much better. I asked him to let me know if he felt uncomfortable or if he used the urinal. He told me he voided three times after the I&O but forgot to save it in the urinal.

## 2024-03-11 NOTE — Plan of Care (Signed)

## 2024-03-11 NOTE — Progress Notes (Signed)
 PROGRESS NOTE   Gregory Murphy  YNW:[id number] DOB: 1986/09/16 DOA: 03/10/2024 PCP: Patient, [name]   Chief Complaint  Patient presents with   Abdominal Pain   Level of care: Telemetry  Brief Admission History:  38 y.o. male with medical history significant for alcohol abuse with associated alcoholic liver cirrhosis and ascites, TBI, depression/panic attacks, and severe thrombocytopenia who presented to the ED with worsening abdominal pain and distention along with shortness of breath over the last 1 day.  He was recently hospitalized 3/16 - 3/20 at [facility] with abdominal pain as well as alcohol withdrawal and decompensated cirrhosis with ascites.  He underwent paracentesis with 1 L of fluid removed and no SBP noted at that time.  He unfortunately left AGAINST MEDICAL ADVICE with no medications prescribed on discharge on 3/20.  He is currently complaining of being thirsty, but denies any chest pain, fevers, or chills.  He claims he has been drinking alcohol on a regular basis with 4-5 beers daily. with elevated heart rates, but stable blood pressures and no fever. UDS positive for benzodiazepines and alcohol level 250. Hemoglobin 11 and platelets of 51,000 with INR 1.6. Noted findings of cirrhosis with large volume ascites on CT scan. He has been started on Dilaudid for pain management as well as Rocephin. He was admitted for decompensated alcoholic liver disease.   Assessment and Plan:  Decompensated alcoholic liver cirrhosis with concern for SBP -pt scheduled for paracentesis 3/26 -Appreciate GI recommendations -Continue Rocephin empirically -daily MELD labs  Concern for SBP - follow up fluid testing from paracentesis - continue daily ceftriaxone for now  Hyperbilirubinemia - Pt is symptomatic with intense pruritus symptoms - consider starting cholestyramine, defer to GI team - recheck in AM   Alcohol abuse with withdrawal -CIWA protocol with close  monitoring -Counseled on cessation   Tobacco abuse -Counseled on cessation   Chronic macrocytic anemia-stable -Likely related to folate deficiency as well as bone marrow suppression -Continue supplements   Severe thrombocytopenia -Secondary to alcohol use -Had recent platelet transfusion during hospitalization at [facility], reconsider as needed -Avoid heparin/antiplatelet agents -Monitor CBC   Alcoholic gastritis -PPI   DVT prophylaxis: SCDs Code Status: Full  Family Communication:  Disposition:    Consultants:  GI  Procedures:  Paracentesis 03/11/24  Antimicrobials:  Ceftriaxone 3/25>>   Subjective: Pt reports intense itching in legs and swelling in feet   Objective: Vitals:   03/11/24 1120 03/11/24 1130 03/11/24 1140 03/11/24 1150  BP: (!) 127/90 (!) 128/91 123/86 122/79  Pulse: 96 98 84 90  Resp: 16 15 16 16   Temp: 98.1 F (36.7 C)     TempSrc: Oral     SpO2: 95% 97% 94% 95%  Weight:      Height:        Intake/Output Summary (Last 24 hours) at 03/11/2024 1320 Last data filed at 03/11/2024 0605 Gross per 24 hour  Intake 240 ml  Output 850 ml  Net -610 ml   Filed Weights   03/10/24 0400 03/10/24 0857  Weight: 50 kg 55.4 kg   Examination:  General exam: Appears calm but Uncomfortable  Respiratory system: Clear to auscultation. Respiratory effort normal. Cardiovascular system: normal S1 & S2 heard. No JVD, murmurs, rubs, gallops or clicks. No pedal edema. Gastrointestinal system: Abdomen is moderately distended, soft and nontender. No organomegaly or masses felt. Normal bowel sounds heard. Central nervous system: Alert and oriented. No focal neurological deficits. Extremities: Symmetric 5 x 5 power. Skin: No  rashes, lesions or ulcers. Psychiatry: Judgement and insight appear poor. Mood & affect flat   Data Reviewed: I have personally reviewed following labs and imaging studies  CBC: Recent Labs  Lab 03/05/24 0519 03/10/24 0414 03/11/24 0442   WBC 3.9* 6.5 5.5  NEUTROABS  --  3.0  --   HGB 8.0* 11.0* 8.9*  HCT 22.9* 30.1* 26.2*  MCV 109.0* 107.1* 111.5*  PLT 44* 51* 36*    Basic Metabolic Panel: Recent Labs  Lab 03/05/24 0519 03/10/24 0414 03/11/24 0442  NA 132* 135 132*  K 3.7 4.0 3.6  CL 98 98 94*  CO2 25 25 32  GLUCOSE 81 156* 109*  BUN 8 7 8   CREATININE 0.82 0.60* 0.75  CALCIUM 8.1* 8.4* 8.3*  MG 1.7 2.1 1.7    CBG: No results for input(s): "GLUCAP" in the last 168 hours.  Recent Results (from the past 240 hours)  Urine Culture (for pregnant, neutropenic or urologic patients or patients with an indwelling urinary catheter)     Status: Abnormal   Collection Time: 03/01/24  3:19 PM   Specimen: Urine, Clean Catch  Result Value Ref Range Status   Specimen Description URINE, CLEAN CATCH  Final   Special Requests ADDED 0001 03/02/2024  Final   Culture (A)  Final    <10,000 COLONIES/mL INSIGNIFICANT GROWTH Performed at [facility], [address] [place], [place] [address]    Report Status 03/02/2024 FINAL  Final  Culture, body fluid w Gram Stain-bottle     Status: None   Collection Time: 03/02/24  1:04 PM   Specimen: Fluid  Result Value Ref Range Status   Specimen Description FLUID PERITONEAL  Final   Special Requests BOTTLES DRAWN AEROBIC AND ANAEROBIC  Final   Culture   Final    NO GROWTH 5 DAYS Performed at [facility], [address], [place], [place] [address]    Report Status 03/07/2024 FINAL  Final  Gram stain     Status: None   Collection Time: 03/02/24  1:04 PM   Specimen: Fluid  Result Value Ref Range Status   Specimen Description FLUID PERITONEAL  Final   Special Requests NONE  Final   Gram Stain   Final    WBC PRESENT, PREDOMINANTLY MONONUCLEAR NO ORGANISMS SEEN CYTOSPIN SMEAR Performed at [facility], [address] [place], [place] [address]    Report Status 03/02/2024 FINAL  Final  Culture, blood (Routine X 2) w Reflex to ID Panel     Status: None  (Preliminary result)   Collection Time: 03/10/24  5:15 AM   Specimen: BLOOD  Result Value Ref Range Status   Specimen Description BLOOD RIGHT ANTECUBITAL  Final   Special Requests   Final    BOTTLES DRAWN AEROBIC AND ANAEROBIC Blood Culture adequate volume   Culture   Final    NO GROWTH 1 DAY Performed at [facility], [address] [place], [place] [address]    Report Status PENDING  Incomplete  Culture, blood (Routine X 2) w Reflex to ID Panel     Status: None (Preliminary result)   Collection Time: 03/10/24  5:21 AM   Specimen: BLOOD  Result Value Ref Range Status   Specimen Description BLOOD BLOOD RIGHT HAND  Final   Special Requests   Final    BOTTLES DRAWN AEROBIC AND ANAEROBIC Blood Culture adequate volume   Culture   Final    NO GROWTH 1 DAY Performed at [facility], [address], [place], [place] [address]    Report Status PENDING  Incomplete     Radiology Studies: US Paracentesis Result Date: 03/11/2024 INDICATION: Patient with history of ETOH cirrhosis, recurrent ascites. Request for diagnostic and therapeutic paracentesis. EXAM: ULTRASOUND GUIDED DIAGNOSTIC AND THERAPEUTIC PARACENTESIS MEDICATIONS: 8 mL 1% lidocaine COMPLICATIONS: None immediate. PROCEDURE: Informed written consent was obtained from the patient after a discussion of the risks, benefits and alternatives to treatment. A timeout was performed prior to the initiation of the procedure. Initial ultrasound scanning demonstrates a large amount of ascites within the right lower abdominal quadrant. The right lower abdomen was prepped and draped in the usual sterile fashion. 1% lidocaine was used for local anesthesia. Following this, a 19 gauge, 7-cm, Yueh catheter was introduced. An ultrasound image was saved for documentation purposes. The paracentesis was performed. The catheter was removed and a dressing was applied. The patient tolerated the procedure well without immediate post procedural complication. Patient  received post-procedure intravenous albumin; see nursing notes for details. FINDINGS: A total of approximately 4.1 L of clear yellow fluid was removed. Samples were sent to the laboratory as requested by the clinical team. IMPRESSION: Successful ultrasound-guided paracentesis yielding 4.1 liters of peritoneal fluid. Performed by Lynnette Caffey, PA-C PLAN: If the patient eventually requires >/=2 paracenteses in a 30 day period, candidacy for formal evaluation by the [facility] Interventional Radiology Portal Hypertension Clinic will be assessed. Electronically Signed   By: Richarda Overlie M.D.   On: 03/11/2024 12:58   CT ABDOMEN PELVIS W CONTRAST Result Date: 03/10/2024 CLINICAL DATA:  Acute, nonlocalized abdominal pain EXAM: CT ABDOMEN AND PELVIS WITH CONTRAST TECHNIQUE: Multidetector CT imaging of the abdomen and pelvis was performed using the standard protocol following bolus administration of intravenous contrast. RADIATION DOSE REDUCTION: This exam was performed according to the departmental dose-optimization program which includes automated exposure control, adjustment of the mA and/or kV according to patient size and/or use of iterative reconstruction technique. CONTRAST:  OMNIPAQUE IOHEXOL 300 MG/ML  SOLN COMPARISON:  Abdominal MRI 03/04/2024 FINDINGS: Lower chest: Small pleural effusions with lower lobe atelectasis. Premature atheromatous calcification of the coronaries. Lower periesophageal varices. Hepatobiliary: Cirrhosis and steatosis. Heterogeneous density of the liver recently evaluated by MRI.Cholelithiasis with very distended gallbladder but no focal wall thickening is seen. Multiple peripheral calcifications favoring adherent stone over mural calcification given eccentric positioning relative to the wall. No bile duct dilatation or detected calculus. Pancreas: Unremarkable. Spleen: Unremarkable. Adrenals/Urinary Tract: Negative adrenals. No hydronephrosis or stone. Unremarkable bladder.  Stomach/Bowel: Submucosal low-density thickening of the proximal colon, likely portal colopathy. No bowel obstruction. Vascular/Lymphatic: Premature atheromatous calcification of the aorta and iliacs for age. No mass or adenopathy. Reproductive:No pathologic findings. Other: Large volume ascites which is low-density and seen throughout the abdomen. Musculoskeletal: No acute abnormalities. IMPRESSION: 1. No acute finding or change from 03/04/2024 MRI. 2. Cirrhosis with large volume ascites. 3. Cholelithiasis with very dilated gallbladder. 4. Small pleural effusions with atelectasis at the lung bases. Electronically Signed   By: Tiburcio Pea M.D.   On: 03/10/2024 06:54   DG Chest Port 1 View Result Date: 03/10/2024 CLINICAL DATA:  Shortness of breath and elevated heart rate EXAM: PORTABLE CHEST 1 VIEW COMPARISON:  09/11/2023 FINDINGS: Low volume chest with band of opacity at the lung bases and small volume pleural fluid. Normal heart size. No pneumothorax. IMPRESSION: Low volume chest with small volume pleural fluid and streaky lower lobe atelectasis. Electronically Signed   By: Audry Riles.D.  On: 03/10/2024 05:37   Scheduled Meds:  cyanocobalamin  500 mcg Oral Daily   folic acid  1 mg Oral Daily   [START ON 03/12/2024] furosemide  40 mg Oral Daily   multivitamin with minerals  1 tablet Oral Daily   pantoprazole  40 mg Oral Daily   [START ON 03/12/2024] spironolactone  100 mg Oral Daily   thiamine  100 mg Oral Daily   Or   thiamine  100 mg Intravenous Daily   Continuous Infusions:  cefTRIAXone (ROCEPHIN)  IV 2 g (03/11/24 0908)     LOS: 1 day   Time spent: 53 mins  Jaeleigh Monaco Laural Benes, MD How to contact the [facility] Attending or Consulting provider 7A - 7P or covering provider during after hours 7P -7A, for this patient?  Check the care team in [facility] and look for a) attending/consulting [facility] provider listed and b) the [facility] team listed Log into [email or web address] to find provider on call.  Locate  the [facility] provider you are looking for under [facility] and page to a number that you can be directly reached. If you still have difficulty reaching the provider, please page the [facility] (Director on Call) for the Hospitalists listed on amion for assistance.  03/11/2024, 1:20 PM

## 2024-03-11 NOTE — Procedures (Signed)
 PROCEDURE SUMMARY:  Successful ultrasound guided paracentesis from the right lower quadrant.  Yielded 4.1 L of clear yellow fluid.  No immediate complications.  The patient tolerated the procedure well.   Specimen was sent for labs.  EBL < 5mL  The patient has required >/=2 paracenteses in a 30 day period and a screening evaluation by the [facility] Interventional Radiology Portal Hypertension Clinic has been arranged.  Lynnette Caffey, PA-C

## 2024-03-11 NOTE — Progress Notes (Signed)
 Patient tolerated right sided Paracentesis and 25G of IV albumin well today and 4.1 Liters of yellow ascites removed with labs collected and sent to lab for processing. Patient verbalized understanding of post procedure instructions and transported via stretcher back to inpatient bed assignment at this time with no acute distress noted.

## 2024-03-11 NOTE — Plan of Care (Signed)
   Problem: Health Behavior/Discharge Planning: Goal: Ability to manage health-related needs will improve Outcome: Progressing

## 2024-03-11 NOTE — Progress Notes (Signed)
 Subjective: Patient reports diffuse itching but worsening itching to his left foot which also appears to be swollen and red. He had some LUQ pain earlier but notes this has improved after receiving medication, Is awaiting para still. He is a/ox4.   Objective: Vital signs in last 24 hours: Temp:  [98.1 F (36.7 C)-98.9 F (37.2 C)] 98.6 F (37 C) (03/26 0349) Pulse Rate:  [93-118] 96 (03/26 0349) Resp:  [13-20] 16 (03/26 0349) BP: (115-150)/(79-110) 115/79 (03/26 0349) SpO2:  [94 %-99 %] 99 % (03/26 0349) Last BM Date : 03/10/24 General:   Alert and oriented, pleasant Head:  Normocephalic and atraumatic. Eyes:  sclera are icteric. Conjuctiva pink.  Mouth:  Without lesions, mucosa pink and moist.  Heart:  S1, S2 present, no murmurs noted.  Lungs: Clear to auscultation bilaterally, without wheezing, rales, or rhonchi.  Abdomen:  Bowel sounds present, abdomen is distended, tight and mildly tender especially in LUQ.  No HSM or hernias noted. No rebound or guarding. No masses appreciated  Pulses:  Normal pulses noted. Extremities:  1+ pitting edema, notably more edema to Left food, some erythema though patient has been scratching Neurologic:  Alert and  oriented x4;  grossly normal neurologically. No overt asterixis, hands are tremulous likely secondary to withdrawal from etoh Skin:  Warm and dry, intact without significant lesions. Erythema to left food. Jaundiced  Psych:  Alert and cooperative. Normal mood and affect.  Intake/Output from previous day: 03/25 0701 - 03/26 0700 In: 240 [P.O.:240] Out: 850 [Urine:850] Intake/Output this shift: No intake/output data recorded.  Lab Results: Recent Labs    03/10/24 0414 03/11/24 0442  WBC 6.5 5.5  HGB 11.0* 8.9*  HCT 30.1* 26.2*  PLT 51* 36*   BMET Recent Labs    03/10/24 0414 03/11/24 0442  NA 135 132*  K 4.0 3.6  CL 98 94*  CO2 25 32  GLUCOSE 156* 109*  BUN 7 8  CREATININE 0.60* 0.75  CALCIUM 8.4* 8.3*   LFT Recent  Labs    03/10/24 0414 03/11/24 0442  PROT 7.2 6.0*  ALBUMIN 2.8* 2.3*  AST 300* 209*  ALT 89* 70*  ALKPHOS 110 81  BILITOT 4.8* 5.3*  BILIDIR 2.2*  --   IBILI 2.6*  --    PT/INR Recent Labs    03/10/24 0414 03/11/24 0442  LABPROT 18.9* 21.4*  INR 1.6* 1.8*    Studies/Results: CT ABDOMEN PELVIS W CONTRAST Result Date: 03/10/2024 CLINICAL DATA:  Acute, nonlocalized abdominal pain EXAM: CT ABDOMEN AND PELVIS WITH CONTRAST TECHNIQUE: Multidetector CT imaging of the abdomen and pelvis was performed using the standard protocol following bolus administration of intravenous contrast. RADIATION DOSE REDUCTION: This exam was performed according to the departmental dose-optimization program which includes automated exposure control, adjustment of the mA and/or kV according to patient size and/or use of iterative reconstruction technique. CONTRAST:  OMNIPAQUE IOHEXOL 300 MG/ML  SOLN COMPARISON:  Abdominal MRI 03/04/2024 FINDINGS: Lower chest: Small pleural effusions with lower lobe atelectasis. Premature atheromatous calcification of the coronaries. Lower periesophageal varices. Hepatobiliary: Cirrhosis and steatosis. Heterogeneous density of the liver recently evaluated by MRI.Cholelithiasis with very distended gallbladder but no focal wall thickening is seen. Multiple peripheral calcifications favoring adherent stone over mural calcification given eccentric positioning relative to the wall. No bile duct dilatation or detected calculus. Pancreas: Unremarkable. Spleen: Unremarkable. Adrenals/Urinary Tract: Negative adrenals. No hydronephrosis or stone. Unremarkable bladder. Stomach/Bowel: Submucosal low-density thickening of the proximal colon, likely portal colopathy. No bowel obstruction. Vascular/Lymphatic: Premature  atheromatous calcification of the aorta and iliacs for age. No mass or adenopathy. Reproductive:No pathologic findings. Other: Large volume ascites which is low-density and seen  throughout the abdomen. Musculoskeletal: No acute abnormalities. IMPRESSION: 1. No acute finding or change from 03/04/2024 MRI. 2. Cirrhosis with large volume ascites. 3. Cholelithiasis with very dilated gallbladder. 4. Small pleural effusions with atelectasis at the lung bases. Electronically Signed   By: Tiburcio Pea M.D.   On: 03/10/2024 06:54   DG Chest Port 1 View Result Date: 03/10/2024 CLINICAL DATA:  Shortness of breath and elevated heart rate EXAM: PORTABLE CHEST 1 VIEW COMPARISON:  09/11/2023 FINDINGS: Low volume chest with band of opacity at the lung bases and small volume pleural fluid. Normal heart size. No pneumothorax. IMPRESSION: Low volume chest with small volume pleural fluid and streaky lower lobe atelectasis. Electronically Signed   By: Tiburcio Pea M.D.   On: 03/10/2024 05:37    Assessment: 38 year old male with history of alcohol abuse, TBI, depression, panic attacks, cholelithiasis, decompensated cirrhosis complicated portal hypertension with ascites, varices per imaging, recent hospitalization earlier this month with abdominal pain, nausea, vomiting treated empirically for SBP and underwent paracentesis yielding 1 L with no SBP on fluid analysis, also with alcoholic hepatitis during that admission treated supportively,  who came to the ED after presenting worsening abdominal pain with distention and shortness of breath. GI consulted for further evaluation  Abdominal pain: Recurrent generalized abdominal pain likely secondary to large volume ascites noted on CT A/P this admission.  Recent pair on 3/17 yielding 1 L with no SBP, completed 4 days of empiric SBP treatment with IV Rocephin (patient left AMA on day 4).  In setting of ascites and pain, SBP cannot be ruled out though less likely in setting of normal white count.  He is scheduled for therapeutic/diagnostic paracentesis today.  He was started on empiric SBP treatment with Rocephin yesterday.  Also has evidence of  cholelithiasis with distended gallbladder on imaging though this is a stable finding, he has no right upper quadrant tenderness and no routine pain after meals.  If pain does not improve following paracentesis, may need to consider biliary etiology.  Cirrhosis: Decompensated with portal hypertension, ascites, varices per imaging, coagulopathy, thrombocytopenia.  Etiology likely secondary to alcohol.  Recent serologic workup showed positive ANA but negative ASMA and AMA.  Viral hepatitis labs with positive hep C antibody but negative hep C RNA.  AFP tumor marker 7.3, MRI liver protocol with no suspicious liver lesions. Ceruloplasmin, A1A, IgG and IgM are pending  Patient with recurrent ascites, left AMA during last hospital admission and was not discharged any diuretics.  Lasix 40mg  and spironolactone 100mg  started yesterday. He notably has some pitting edema noted to LEs as well today. Paracentesis to be done later this morning.  Ammonia level mildly elevated, no evidence of HE, normal bowel movements. will need to keep a close eye on mental status.  Patient will need EGD in the near future given evidence of varices on imaging.  This may done in outpatient setting.  Also considering portal hypertension with ascites and varices, our team has recommended starting nonselective beta-blocker for EV bleeding prophylaxis in outpatient setting.   MELD 3.0 23  Alcoholic hepatitis: Recently inpatient with alcoholic hepatitis 3/16 - 3/20.   DF at that time 78.2 using PT control 13 on 3/18.  DF today is 43.9. No recommendations for steroids due to concern for compliance as well as infection risk in setting of cirrhosis.  He does complain of some diffuse pruritus today which could be in setting of hyperbilirubinemia, could consider addition of hydroxyzine if this persists.   Anemia: Chronic.  Hemoglobin stable/improved on admission 11.  Down to 8.9 this morning, appears to have been in the 8-9 range over the past  few months. Ferritin elevated at 1152, expected in setting of alcoholic hepatitis as acute phase reactant.  Iron 133, TIBC 131, saturation 101. Also found to have recent folate deficiency with folate 4.1 on 3/17.  Is now on folic acid and thiamine.  He does take vitamin B12 daily which is adequately replaced.  Denies overt GI bleeding.  No prior endoscopic evaluations.  Will continue to monitor  Plan: Follow for paracentesis results, max 6 L with diagnostic labs Continue Rocephin empirically for SBP Lasix 40 mg/prolactin 100 mg daily 2 g sodium diet CBC, CMP, INR daily Monitor for changes of HE Monitor for overt GI bleeding CIWA protocol Folic acid 1 mg daily Thiamine daily EGD as an outpatient with primary GI ([place]) Recommend starting nonselective beta-blocker as an outpatient Follow for pending serologies    LOS: 1 day    03/11/2024, 8:58 AM   Reagyn Facemire L. Jeanmarie Hubert, MSN, APRN, AGNP-C Adult-Gerontology Nurse Practitioner [facility]

## 2024-03-11 NOTE — Hospital Course (Signed)
 38 y.o. male with medical history significant for alcohol abuse with associated alcoholic liver cirrhosis and ascites, TBI, depression/panic attacks, and severe thrombocytopenia who presented to the ED with worsening abdominal pain and distention along with shortness of breath over the last 1 day.  He was recently hospitalized 3/16 - 3/20 at [facility] with abdominal pain as well as alcohol withdrawal and decompensated cirrhosis with ascites.  He underwent paracentesis with 1 L of fluid removed and no SBP noted at that time.  He unfortunately left AGAINST MEDICAL ADVICE with no medications prescribed on discharge on 3/20.  He is currently complaining of being thirsty, but denies any chest pain, fevers, or chills.  He claims he has been drinking alcohol on a regular basis with 4-5 beers daily. with elevated heart rates, but stable blood pressures and no fever. UDS positive for benzodiazepines and alcohol level 250. Hemoglobin 11 and platelets of 51,000 with INR 1.6. Noted findings of cirrhosis with large volume ascites on CT scan. He has been started on Dilaudid for pain management as well as Rocephin. He was admitted for decompensated alcoholic liver disease.

## 2024-03-12 DIAGNOSIS — K652 Spontaneous bacterial peritonitis: Secondary | ICD-10-CM

## 2024-03-12 DIAGNOSIS — Z8719 Personal history of other diseases of the digestive system: Secondary | ICD-10-CM

## 2024-03-12 DIAGNOSIS — K7011 Alcoholic hepatitis with ascites: Secondary | ICD-10-CM

## 2024-03-12 DIAGNOSIS — D689 Coagulation defect, unspecified: Secondary | ICD-10-CM

## 2024-03-12 DIAGNOSIS — E538 Deficiency of other specified B group vitamins: Secondary | ICD-10-CM

## 2024-03-12 DIAGNOSIS — F1011 Alcohol abuse, in remission: Secondary | ICD-10-CM

## 2024-03-12 DIAGNOSIS — R7989 Other specified abnormal findings of blood chemistry: Secondary | ICD-10-CM

## 2024-03-12 LAB — COMPREHENSIVE METABOLIC PANEL WITH GFR
ALT: 47 U/L — ABNORMAL HIGH (ref 0–44)
AST: 144 U/L — ABNORMAL HIGH (ref 15–41)
Albumin: 2.4 g/dL — ABNORMAL LOW (ref 3.5–5.0)
Alkaline Phosphatase: 54 U/L (ref 38–126)
Anion gap: 9 (ref 5–15)
BUN: 9 mg/dL (ref 6–20)
CO2: 28 mmol/L (ref 22–32)
Calcium: 8.1 mg/dL — ABNORMAL LOW (ref 8.9–10.3)
Chloride: 95 mmol/L — ABNORMAL LOW (ref 98–111)
Creatinine, Ser: 0.69 mg/dL (ref 0.61–1.24)
GFR, Estimated: >60 mL/min (ref >60)
Glucose, Bld: 91 mg/dL (ref 70–99)
Potassium: 3.7 mmol/L (ref 3.5–5.1)
Sodium: 132 mmol/L — ABNORMAL LOW (ref 135–145)
Total Bilirubin: 5 mg/dL — ABNORMAL HIGH (ref 0.0–1.2)
Total Protein: 5.3 g/dL — ABNORMAL LOW (ref 6.5–8.1)

## 2024-03-12 LAB — PROTIME-INR
INR: 1.9 — ABNORMAL HIGH (ref 0.8–1.2)
Prothrombin Time: 21.7 s — ABNORMAL HIGH (ref 11.4–15.2)

## 2024-03-12 LAB — IGG, IGA, IGM
IgA: 608 mg/dL — ABNORMAL HIGH (ref 90–386)
IgG (Immunoglobin G), Serum: 1964 mg/dL — ABNORMAL HIGH (ref 603–1613)
IgM (Immunoglobulin M), Srm: 361 mg/dL — ABNORMAL HIGH (ref 20–172)

## 2024-03-12 LAB — CBC
HCT: 24.2 % — ABNORMAL LOW (ref 39.0–52.0)
Hemoglobin: 8.3 g/dL — ABNORMAL LOW (ref 13.0–17.0)
MCH: 37.7 pg — ABNORMAL HIGH (ref 26.0–34.0)
MCHC: 34.3 g/dL (ref 30.0–36.0)
MCV: 110 fL — ABNORMAL HIGH (ref 80.0–100.0)
Platelets: 25 10*3/uL — CL (ref 150–400)
RBC: 2.2 MIL/uL — ABNORMAL LOW (ref 4.22–5.81)
RDW: 14.3 % (ref 11.5–15.5)
WBC: 4.4 10*3/uL (ref 4.0–10.5)
nRBC: 0 % (ref 0.0–0.2)

## 2024-03-12 LAB — AMMONIA: Ammonia: 34 umol/L (ref 9–35)

## 2024-03-12 LAB — CERULOPLASMIN: Ceruloplasmin: 16.4 mg/dL (ref 16.0–31.0)

## 2024-03-12 LAB — ALPHA-1-ANTITRYPSIN: A-1 Antitrypsin, Ser: 136 mg/dL (ref 95–164)

## 2024-03-12 MED ORDER — PHYTONADIONE 5 MG PO TABS
5.0000 mg | ORAL_TABLET | Freq: Once | ORAL | Status: AC
Start: 2024-03-12 — End: 2024-03-12
  Administered 2024-03-12: 5 mg via ORAL
  Filled 2024-03-12: qty 1

## 2024-03-12 MED ORDER — SENNA 8.6 MG PO TABS
1.0000 | ORAL_TABLET | Freq: Every day | ORAL | Status: DC | PRN
  Administered 2024-03-13: 8.6 mg via ORAL
  Filled 2024-03-12: qty 1

## 2024-03-12 NOTE — Progress Notes (Signed)
 Initial Nutrition Assessment  DOCUMENTATION CODES:   Not applicable  INTERVENTION:   2 gm sodium diet education provided; added Low Sodium Nutrition Therapy handout to discharge instructions.  Magic cup BID with meals, each supplement provides 290 kcal and 9 grams of protein. Continue MVI with minerals daily.  NUTRITION DIAGNOSIS:   Increased nutrient needs related to chronic illness (liver cirrhosis) as evidenced by estimated needs.  GOAL:   Patient will meet greater than or equal to 90% of their needs  MONITOR:   PO intake, Supplement acceptance  REASON FOR ASSESSMENT:   Consult Diet education  ASSESSMENT:   38 yo male admitted with decompensated cirrhosis. PMH includes TBI, smoker, alcohol abuse, alcoholic liver cirrhosis, ascites, depression, anxiety.  Chart reviewed. Patient left AMA on 3/20 from [facility] where he was admitted for alcoholic cirrhosis of liver with ascites. S/P paracentesis 3/17.   Admitted to [facility] on 3/25. S/P paracentesis with 4.1 L fluid removed on 3/26.  Labs reviewed. Na 132  Medications reviewed and include vitamin B-12, folic acid, Lasix, MVI with minerals, Protonix, spironolactone, thiamine.  Patient is currently on a 2 gm sodium diet. Meal intakes: 100% of breakfast today.  Weight hx reviewed. Weight has been ~49.5-50 kg for the past 2 months. Currently 55.4 kg, likely increased with BLE edema and anasarca.   Patient is at increased nutrition risk given hx of alcohol abuse and liver dysfunction. Unable to obtain enough information at this time for identification of malnutrition.   NUTRITION - FOCUSED PHYSICAL EXAM:  Unable to complete  Diet Order:   Diet Order             Diet Heart Fluid consistency: Thin; Fluid restriction: 1500 mL Fluid  Diet effective now                   EDUCATION NEEDS:   Education needs have been addressed  Skin:  Skin Assessment: Reviewed RN Assessment  Last BM:  3/25  Height:   Ht Readings  from Last 1 Encounters:  03/10/24 5\' 1"  (1.549 m)   Weight:   Wt Readings from Last 1 Encounters:  03/10/24 55.4 kg    Ideal Body Weight:  50.9 kg  BMI:  Body mass index is 23.09 kg/m.  Estimated Nutritional Needs:   Kcal:  1700-1900  Protein:  75-85 gm  Fluid:  1.7-1.9 L   Gabriel Rainwater RD, LDN, CNSC Contact via secure chat. If unavailable, use group chat "RD Inpatient."

## 2024-03-12 NOTE — Plan of Care (Signed)

## 2024-03-12 NOTE — Progress Notes (Signed)
 Gastroenterology Progress Note   Referring Provider: No ref. provider found Primary Care Physician:  Gregory Murphy, [name] Primary Gastroenterologist:  [facility]  Gregory Murphy ID: Gregory Murphy; [id number]; Nov 20, 1986   Subjective:    Had dark brown stool today. Last stool two days ago. Abdominal pain and distention better. States his appetite is good. Does not prefer the food. Plans to get food brought in. Discussed 2 gram sodium diet. He states he is done with etoh. He wants to live. He was sober for 6 months once. He has done inpatient rehab in the past but thinks he can do this on his own.   Objective:   Vital signs in last 24 hours: Temp:  [98.1 F (36.7 C)-99 F (37.2 C)] 99 F (37.2 C) (03/27 0454) Pulse Rate:  [84-98] 86 (03/27 0454) Resp:  [15-20] 16 (03/27 0454) BP: (117-128)/(65-91) 122/65 (03/27 0454) SpO2:  [93 %-97 %] 95 % (03/27 0454) Last BM Date : 03/10/24 General:   Alert,  chronically ill appearing male, pleasant and cooperative in NAD Head:  Normocephalic and atraumatic. Eyes:  Scleral icterus.   Abdomen:  Soft, nontender, some distention/firm abd. Normal bowel sounds, without guarding, and without rebound.   Extremities:  trace to 1+ ankle edema. Neurologic:  Alert and  oriented x4;  grossly normal neurologically. Skin:  jaundice Psych:  Alert and cooperative. Normal mood and affect.  Intake/Output from previous day: 03/26 0701 - 03/27 0700 In: 937.6 [P.O.:837; IV Piggyback:100.6] Out: 300 [Urine:300] Intake/Output this shift: No intake/output data recorded.  Lab Results: CBC Recent Labs    03/10/24 0414 03/11/24 0442 03/12/24 0436  WBC 6.5 5.5 4.4  HGB 11.0* 8.9* 8.3*  HCT 30.1* 26.2* 24.2*  MCV 107.1* 111.5* 110.0*  PLT 51* 36* 25*   BMET Recent Labs    03/10/24 0414 03/11/24 0442 03/12/24 0436  NA 135 132* 132*  K 4.0 3.6 3.7  CL 98 94* 95*  CO2 25 32 28  GLUCOSE 156* 109* 91  BUN 7 8 9   CREATININE 0.60* 0.75 0.69  CALCIUM  8.4* 8.3* 8.1*   LFTs Recent Labs    03/10/24 0414 03/11/24 0442 03/12/24 0436  BILITOT 4.8* 5.3* 5.0*  BILIDIR 2.2*  --   --   IBILI 2.6*  --   --   ALKPHOS 110 81 54  AST 300* 209* 144*  ALT 89* 70* 47*  PROT 7.2 6.0* 5.3*  ALBUMIN 2.8* 2.3* 2.4*   Recent Labs    03/10/24 0414  LIPASE 46   PT/INR Recent Labs    03/10/24 0414 03/11/24 0442 03/12/24 0436  LABPROT 18.9* 21.4* 21.7*  INR 1.6* 1.8* 1.9*   Imaging Studies: US Paracentesis Result Date: 03/11/2024 INDICATION: Gregory Murphy with history of ETOH cirrhosis, recurrent ascites. Request for diagnostic and therapeutic paracentesis. EXAM: ULTRASOUND GUIDED DIAGNOSTIC AND THERAPEUTIC PARACENTESIS MEDICATIONS: 8 mL 1% lidocaine COMPLICATIONS: None immediate. PROCEDURE: Informed written consent was obtained from the Gregory Murphy after a discussion of the risks, benefits and alternatives to treatment. A timeout was performed prior to the initiation of the procedure. Initial ultrasound scanning demonstrates a large amount of ascites within the right lower abdominal quadrant. The right lower abdomen was prepped and draped in the usual sterile fashion. 1% lidocaine was used for local anesthesia. Following this, a 19 gauge, 7-cm, Yueh catheter was introduced. An ultrasound image was saved for documentation purposes. The paracentesis was performed. The catheter was removed and a dressing was applied. The Gregory Murphy tolerated the procedure well  without immediate post procedural complication. Gregory Murphy received post-procedure intravenous albumin; see nursing notes for details. FINDINGS: A total of approximately 4.1 L of clear yellow fluid was removed. Samples were sent to the laboratory as requested by the clinical team. IMPRESSION: Successful ultrasound-guided paracentesis yielding 4.1 liters of peritoneal fluid. Performed by Lynnette Caffey, PA-C PLAN: If the Gregory Murphy eventually requires >/=2 paracenteses in a 30 day period, candidacy for formal  evaluation by the [facility] will be assessed. Electronically Signed   By: Richarda Overlie M.D.   On: 03/11/2024 12:58   CT ABDOMEN PELVIS W CONTRAST Result Date: 03/10/2024 CLINICAL DATA:  Acute, nonlocalized abdominal pain EXAM: CT ABDOMEN AND PELVIS WITH CONTRAST TECHNIQUE: Multidetector CT imaging of the abdomen and pelvis was performed using the standard protocol following bolus administration of intravenous contrast. RADIATION DOSE REDUCTION: This exam was performed according to the departmental dose-optimization program which includes automated exposure control, adjustment of the mA and/or kV according to Gregory Murphy size and/or use of iterative reconstruction technique. CONTRAST:  OMNIPAQUE IOHEXOL 300 MG/ML  SOLN COMPARISON:  Abdominal MRI 03/04/2024 FINDINGS: Lower chest: Small pleural effusions with lower lobe atelectasis. Premature atheromatous calcification of the coronaries. Lower periesophageal varices. Hepatobiliary: Cirrhosis and steatosis. Heterogeneous density of the liver recently evaluated by MRI.Cholelithiasis with very distended gallbladder but no focal wall thickening is seen. Multiple peripheral calcifications favoring adherent stone over mural calcification given eccentric positioning relative to the wall. No bile duct dilatation or detected calculus. Pancreas: Unremarkable. Spleen: Unremarkable. Adrenals/Urinary Tract: Negative adrenals. No hydronephrosis or stone. Unremarkable bladder. Stomach/Bowel: Submucosal low-density thickening of the proximal colon, likely portal colopathy. No bowel obstruction. Vascular/Lymphatic: Premature atheromatous calcification of the aorta and iliacs for age. No mass or adenopathy. Reproductive:No pathologic findings. Other: Large volume ascites which is low-density and seen throughout the abdomen. Musculoskeletal: No acute abnormalities. IMPRESSION: 1. No acute finding or change from 03/04/2024 MRI. 2.  Cirrhosis with large volume ascites. 3. Cholelithiasis with very dilated gallbladder. 4. Small pleural effusions with atelectasis at the lung bases. Electronically Signed   By: Tiburcio Pea M.D.   On: 03/10/2024 06:54   DG Chest Port 1 View Result Date: 03/10/2024 CLINICAL DATA:  Shortness of breath and elevated heart rate EXAM: PORTABLE CHEST 1 VIEW COMPARISON:  09/11/2023 FINDINGS: Low volume chest with band of opacity at the lung bases and small volume pleural fluid. Normal heart size. No pneumothorax. IMPRESSION: Low volume chest with small volume pleural fluid and streaky lower lobe atelectasis. Electronically Signed   By: Tiburcio Pea M.D.   On: 03/10/2024 05:37   MR LIVER W WO CONTRAST Result Date: 03/04/2024 CLINICAL DATA:  Unintended weight loss, cirrhosis EXAM: MRI ABDOMEN WITHOUT AND WITH CONTRAST TECHNIQUE: Multiplanar multisequence MR imaging of the abdomen was performed both before and after the administration of intravenous contrast. CONTRAST:  5mL GADAVIST GADOBUTROL 1 MMOL/ML IV SOLN COMPARISON:  CT abdomen pelvis, 03/01/2024 FINDINGS: Lower chest: Trace pleural effusions. Hepatobiliary: Coarse, nodular cirrhotic morphology of the liver. Profound hepatic steatosis. Distended gallbladder containing tiny gallstones. Gallbladder wall thickening and pericholecystic fluid, nonspecific in the setting of ascites. No biliary ductal dilatation. Pancreas: Unremarkable. No pancreatic ductal dilatation or surrounding inflammatory changes. Spleen: Normal in size without significant abnormality. Adrenals/Urinary Tract: Adrenal glands are unremarkable. Kidneys are normal, without renal calculi, solid lesion, or hydronephrosis. Stomach/Bowel: Stomach is within normal limits. Thickening of the cecum and ascending colon, likely reflecting portal colopathy. Vascular/Lymphatic: Aortic atherosclerosis. Small gastroesophageal varices. No enlarged abdominal lymph  nodes. Other: No abdominal wall hernia or  abnormality. Moderate volume ascites throughout the abdomen. Musculoskeletal: No acute or significant osseous findings. IMPRESSION: 1. Cirrhosis and profound hepatic steatosis. No suspicious liver lesion. 2. Moderate volume ascites throughout the abdomen. 3. Small gastroesophageal varices. 4. Thickening of the cecum and ascending colon, likely reflecting portal colopathy. 5. Distended gallbladder containing tiny gallstones. Gallbladder wall thickening and pericholecystic fluid, nonspecific in the setting of ascites. 6. Trace pleural effusions. Electronically Signed   By: Jearld Lesch M.D.   On: 03/04/2024 09:30   IR Paracentesis Result Date: 03/02/2024 INDICATION: 38 year old male with history of alcoholic cirrhosis presents with abdominal distention, ascites. Request made for diagnostic paracentesis. EXAM: ULTRASOUND GUIDED DIAGNOSTIC PARACENTESIS MEDICATIONS: 10 mL 1% lidocaine COMPLICATIONS: None immediate. PROCEDURE: Informed written consent was obtained from the Gregory Murphy after a discussion of the risks, benefits and alternatives to treatment. A timeout was performed prior to the initiation of the procedure. Initial ultrasound scanning demonstrates a small amount of ascites within the right lower abdominal quadrant. The right lower abdomen was prepped and draped in the usual sterile fashion. 1% lidocaine was used for local anesthesia. Following this, a 19 gauge, 7-cm, Yueh catheter was introduced. An ultrasound image was saved for documentation purposes. The paracentesis was performed. The catheter was removed and a dressing was applied. The Gregory Murphy tolerated the procedure well without immediate post procedural complication. FINDINGS: A total of approximately 1.0 liters of yellow fluid was removed. Samples were sent to the laboratory as requested by the clinical team. IMPRESSION: Successful ultrasound-guided paracentesis yielding 1.0 liters of peritoneal fluid. Electronically Signed   By: Corlis Leak M.D.    On: 03/02/2024 16:01   CT ABDOMEN PELVIS W CONTRAST Result Date: 03/01/2024 CLINICAL DATA:  Abdominal pain EXAM: CT ABDOMEN AND PELVIS WITH CONTRAST TECHNIQUE: Multidetector CT imaging of the abdomen and pelvis was performed using the standard protocol following bolus administration of intravenous contrast. RADIATION DOSE REDUCTION: This exam was performed according to the departmental dose-optimization program which includes automated exposure control, adjustment of the mA and/or kV according to Gregory Murphy size and/or use of iterative reconstruction technique. CONTRAST:  75mL OMNIPAQUE IOHEXOL 350 MG/ML SOLN COMPARISON:  09/09/2023 FINDINGS: Lower chest: No acute findings. Hepatobiliary: Severe diffuse low-density throughout the liver compatible with fatty infiltration. Nodular contours of the liver compatible with cirrhosis. Multiple layering gallstones within the gallbladder. No biliary ductal dilatation. Pancreas: No focal abnormality or ductal dilatation. Spleen: No focal abnormality.  Normal size. Adrenals/Urinary Tract: No adrenal abnormality. No focal renal abnormality. No stones or hydronephrosis. Urinary bladder is unremarkable. Stomach/Bowel: Wall thickening within the right colon felt to reflect portal colopathy. Stomach and small bowel decompressed. No bowel obstruction. Vascular/Lymphatic: Aortic atherosclerosis. No evidence of aneurysm or adenopathy. Distal esophageal varices noted compatible with portal venous hypertension. Reproductive: No visible focal abnormality. Other: Moderate to large volume ascites in the abdomen or pelvis. Musculoskeletal: No acute bony abnormality. IMPRESSION: Severe hepatic steatosis with cirrhosis. Associated distal esophageal varices and moderate to large volume ascites. Cholelithiasis.  No CT evidence of acute cholecystitis. Electronically Signed   By: Charlett Nose M.D.   On: 03/01/2024 19:10   XR Finger Thumb Left Result Date: 02/27/2024 X-rays of the left thumb  demonstrate distal phalanx fracture with appropriate placement of the pin across the fracture site, and extend across the IP joint with appropriate positioning within the proximal phalanx.  XR Finger Thumb Left Result Date: 02/18/2024 X-rays of the left thumb were obtained X-rays demonstrate stable  appearance of the distal phalanx fracture with pin well located across the IP joint.  Fracture comminution is visualized.  [2 weeks]  Assessment:   38 year old male with history of alcohol abuse, TBI, depression, panic attacks, cholelithiasis, decompensated cirrhosis complicated portal hypertension with ascites, varices per imaging, profound thrombocytopenia, distended gallbladder on imaging, recent hospitalization earlier this month with abd pain, N/V treated empirically for SBP and underwent para yielding 1L with no SBP on fluid analysis, also with etoh hepatitis during that admission treated supportively, who came to the ED after presenting worsening abdominal pain with distention and shortness of breath. GI consulted for further evaluation   Abdominal pain: Recurrent generalized abdominal pain likely secondary to large volume ascites noted on CT A/P this admission.  Recent para on 3/17 yielding 1 L with no SBP, completed 4 days of empiric SBP treatment with IV Rocephin (Gregory Murphy left AMA on day 4).  In setting of ascites and pain, SBP cannot be ruled out. Repeat para yesterday with 4.1 L removed. Cell count 82 nucleated white cells.  He was started on empiric SBP treatment with Rocephin yesterday.  Also has evidence of cholelithiasis with distended gallbladder on imaging though this is a stable finding, he has no right upper quadrant tenderness and no routine pain after meals.  If pain does not improve following paracentesis, may need to consider biliary etiology.   Cirrhosis: Decompensated with portal hypertension, ascites, varices per imaging, coagulopathy, thrombocytopenia.  Etiology likely secondary to  alcohol.  Recent serologic workup showed positive ANA but negative ASMA and AMA. IgG 1964, IgA 608, IgM 361 all elevated. A1A normal. Viral hepatitis labs with positive hep C antibody but negative hep C RNA.  AFP tumor marker 7.3, MRI liver protocol with no suspicious liver lesions. Ceruloplasmin pending   Gregory Murphy with recurrent ascites, left AMA during last hospital admission and was not discharged with any diuretics.  Lasix 40mg  and spironolactone 100mg  started two days ago with some LE edema. Para with 4.1L removed. LE edema improved.   Ammonia level mildly elevated, no evidence of HE, normal bowel movements. will need to keep a close eye on mental status.   Gregory Murphy will need EGD in the near future given evidence of varices on imaging.  This may done in outpatient setting.  Also considering portal hypertension with ascites and varices, our team has recommended starting nonselective beta-blocker for EV bleeding prophylaxis in outpatient setting.    MELD 3.0 23-->23   Alcoholic hepatitis: Recently inpatient with alcoholic hepatitis 3/16 - 3/20.   DF at that time 78.2 using PT control 13 on 3/18.  DF yesterday is 43.9, today 45. No recommendations for steroids due to concern for compliance as well as infection risk in setting of cirrhosis. He does complain of some diffuse pruritus today which could be in setting of hyperbilirubinemia, could consider addition of hydroxyzine if this persists.    Anemia: Chronic.  Hemoglobin stable/improved on admission 11.  Down to 8.3 this morning, appears to have been in the 8-9 range over the past few months. Ferritin elevated at 1152, expected in setting of alcoholic hepatitis as acute phase reactant.  Iron 133, TIBC 131, saturation 101. Also found to have recent folate deficiency with folate 4.1 on 3/17.  Is now on folic acid and thiamine.  He does take vitamin B12 daily which is adequately replaced.  Denies overt GI bleeding.  No prior endoscopic evaluations.  Will  continue to monitor  Thrombocytopenia: chronic. Typically in 50-60 range  but during his recent hospitalization his platelets were 18 on 3/18. This admission 51-->36. No evidence of bleeding. Continue to monitor.       Plan:   F/u pending ascitic culture results. Lasix 40/spironolactone 100mg  daily 2g Na diet, nutrition consult for Gregory Murphy education Folic acid 1mg  daily Thiamine daily. F/u pending serologies Recommend nonselective beta blocker to be started as outpatient, due to decompensated cirrhosis, would consider use of nadolol, can be started by primary GI. EGD as outpatient with primary GI ([place]). Daily weights. Discussed at length regarding poor prognosis without complete etoh cessation.  Daily CMET/CBC/INR   LOS: 2 days   Leanna Battles. Dixon Boos [facility] [phone] 3/27/20258:17 AM

## 2024-03-12 NOTE — Plan of Care (Signed)

## 2024-03-12 NOTE — Progress Notes (Signed)
 PROGRESS NOTE   Gregory Murphy  ZOX:[id number] DOB: 01-29-1986 DOA: 03/10/2024 PCP: Patient, [name]   Chief Complaint  Patient presents with   Abdominal Pain   Level of care: Telemetry  Brief Admission History:  38 y.o. male with medical history significant for alcohol abuse with associated alcoholic liver cirrhosis and ascites, TBI, depression/panic attacks, and severe thrombocytopenia who presented to the ED with worsening abdominal pain and distention along with shortness of breath over the last 1 day.  He was recently hospitalized 3/16 - 3/20 at [facility] with abdominal pain as well as alcohol withdrawal and decompensated cirrhosis with ascites.  He underwent paracentesis with 1 L of fluid removed and no SBP noted at that time.  He unfortunately left AGAINST MEDICAL ADVICE with no medications prescribed on discharge on 3/20.  He is currently complaining of being thirsty, but denies any chest pain, fevers, or chills.  He claims he has been drinking alcohol on a regular basis with 4-5 beers daily. with elevated heart rates, but stable blood pressures and no fever. UDS positive for benzodiazepines and alcohol level 250. Hemoglobin 11 and platelets of 51,000 with INR 1.6. Noted findings of cirrhosis with large volume ascites on CT scan. He has been started on Dilaudid for pain management as well as Rocephin. He was admitted for decompensated alcoholic liver disease.   Assessment and Plan:  Decompensated alcoholic liver cirrhosis with concern for SBP -pt scheduled for paracentesis 3/26 -Appreciate GI recommendations -Continue Rocephin empirically -daily MELD labs -nadolol to be started outpatient per GI  -EGD outpatient with [facility] given notation of esophageal varices  Concern for SBP - follow up fluid testing from paracentesis - continue daily ceftriaxone for now  Hyperbilirubinemia - Pt is symptomatic with intense pruritus symptoms - consider starting cholestyramine,  defer to GI team - recheck in AM   Alcohol abuse with withdrawal -CIWA protocol with close monitoring -Counseled on cessation   Tobacco abuse -Counseled on cessation   Chronic macrocytic anemia-stable -Likely related to folate deficiency as well as bone marrow suppression -Continue supplements   Severe thrombocytopenia -Secondary to alcohol use -Had recent platelet transfusion during hospitalization at [facility], reconsider as needed -Avoid heparin/antiplatelet agents -Monitor CBC -no evidence of active bleeding  Elevated PT/INR -secondary to advanced liver cirrhosis -vitamin K 5 mg x 1 dose on 3/27 -follow   Alcoholic gastritis -PPI   DVT prophylaxis: SCDs Code Status: Full  Family Communication:  Disposition:    Consultants:  GI  Procedures:  Paracentesis 03/11/24  Antimicrobials:  Ceftriaxone 3/25>>   Subjective: Itching slightly improved; says his abdomen feels better after paracentesis, says he will stop all alcohol use going forwards, verbalizes understanding of high mortality if he continues alcohol use   Objective: Vitals:   03/11/24 1140 03/11/24 1150 03/11/24 2008 03/12/24 0454  BP: 123/86 122/79 119/69 122/65  Pulse: 84 90 89 86  Resp: 16 16 20 16   Temp:   98.7 F (37.1 C) 99 F (37.2 C)  TempSrc:   Oral Oral  SpO2: 94% 95% 93% 95%  Weight:      Height:        Intake/Output Summary (Last 24 hours) at 03/12/2024 1135 Last data filed at 03/12/2024 0830 Gross per 24 hour  Intake 877.57 ml  Output 300 ml  Net 577.57 ml   Filed Weights   03/10/24 0400 03/10/24 0857  Weight: 50 kg 55.4 kg   Examination:  General exam: Appears calm but Uncomfortable  Respiratory system: Clear to auscultation. Respiratory effort normal. Cardiovascular system: normal S1 & S2 heard. No JVD, murmurs, rubs, gallops or clicks. No pedal edema. Gastrointestinal system: Abdomen is much less distended, soft and nontender. No organomegaly or masses felt. Normal bowel  sounds heard. Central nervous system: Alert and oriented. No focal neurological deficits. Extremities: Symmetric 5 x 5 power. Skin: jaundiced Psychiatry: Judgement and insight appear poor. Mood & affect flat   Data Reviewed: I have personally reviewed following labs and imaging studies  CBC: Recent Labs  Lab 03/10/24 0414 03/11/24 0442 03/12/24 0436  WBC 6.5 5.5 4.4  NEUTROABS 3.0  --   --   HGB 11.0* 8.9* 8.3*  HCT 30.1* 26.2* 24.2*  MCV 107.1* 111.5* 110.0*  PLT 51* 36* 25*    Basic Metabolic Panel: Recent Labs  Lab 03/10/24 0414 03/11/24 0442 03/12/24 0436  NA 135 132* 132*  K 4.0 3.6 3.7  CL 98 94* 95*  CO2 25 32 28  GLUCOSE 156* 109* 91  BUN 7 8 9   CREATININE 0.60* 0.75 0.69  CALCIUM 8.4* 8.3* 8.1*  MG 2.1 1.7  --     CBG: No results for input(s): "GLUCAP" in the last 168 hours.  Recent Results (from the past 240 hours)  Culture, body fluid w Gram Stain-bottle     Status: None   Collection Time: 03/02/24  1:04 PM   Specimen: Fluid  Result Value Ref Range Status   Specimen Description FLUID PERITONEAL  Final   Special Requests BOTTLES DRAWN AEROBIC AND ANAEROBIC  Final   Culture   Final    NO GROWTH 5 DAYS Performed at [facility], [address] [place], [place] [address]    Report Status 03/07/2024 FINAL  Final  Gram stain     Status: None   Collection Time: 03/02/24  1:04 PM   Specimen: Fluid  Result Value Ref Range Status   Specimen Description FLUID PERITONEAL  Final   Special Requests NONE  Final   Gram Stain   Final    WBC PRESENT, PREDOMINANTLY MONONUCLEAR NO ORGANISMS SEEN CYTOSPIN SMEAR Performed at [facility], [address], [place], [place] [address]    Report Status 03/02/2024 FINAL  Final  Culture, blood (Routine X 2) w Reflex to ID Panel     Status: None (Preliminary result)   Collection Time: 03/10/24  5:15 AM   Specimen: BLOOD  Result Value Ref Range Status   Specimen Description BLOOD RIGHT ANTECUBITAL   Final   Special Requests   Final    BOTTLES DRAWN AEROBIC AND ANAEROBIC Blood Culture adequate volume   Culture   Final    NO GROWTH 2 DAYS Performed at [facility], [address], [place], [place] [address]    Report Status PENDING  Incomplete  Culture, blood (Routine X 2) w Reflex to ID Panel     Status: None (Preliminary result)   Collection Time: 03/10/24  5:21 AM   Specimen: BLOOD  Result Value Ref Range Status   Specimen Description BLOOD BLOOD RIGHT HAND  Final   Special Requests   Final    BOTTLES DRAWN AEROBIC AND ANAEROBIC Blood Culture adequate volume   Culture   Final    NO GROWTH 2 DAYS Performed at [facility], [address] [place], [place] [address]    Report Status PENDING  Incomplete  Gram stain     Status: None   Collection Time: 03/11/24 11:30 AM   Specimen:  Ascitic  Result Value Ref Range Status   Specimen Description ASCITIC  Final   Special Requests NONE  Final   Gram Stain   Final    ASCITIC NO ORGANISMS SEEN WBC PRESENT, PREDOMINANTLY MONONUCLEAR CYTOSPIN SMEAR Performed at [facility], [address] [place], [place] [address]    Report Status 03/11/2024 FINAL  Final  Culture, body fluid w Gram Stain-bottle     Status: None (Preliminary result)   Collection Time: 03/11/24 11:30 AM   Specimen: Ascitic  Result Value Ref Range Status   Specimen Description ASCITIC  Final   Special Requests BOTTLES DRAWN AEROBIC AND ANAEROBIC 10CC  Final   Culture   Final    NO GROWTH < 24 HOURS Performed at [facility], [address] [place], [place] [address]    Report Status PENDING  Incomplete     Radiology Studies: US Paracentesis Result Date: 03/11/2024 INDICATION: Patient with history of ETOH cirrhosis, recurrent ascites. Request for diagnostic and therapeutic paracentesis. EXAM: ULTRASOUND GUIDED DIAGNOSTIC AND THERAPEUTIC PARACENTESIS MEDICATIONS: 8 mL 1% lidocaine COMPLICATIONS: None immediate. PROCEDURE: Informed written consent was obtained from  the patient after a discussion of the risks, benefits and alternatives to treatment. A timeout was performed prior to the initiation of the procedure. Initial ultrasound scanning demonstrates a large amount of ascites within the right lower abdominal quadrant. The right lower abdomen was prepped and draped in the usual sterile fashion. 1% lidocaine was used for local anesthesia. Following this, a 19 gauge, 7-cm, Yueh catheter was introduced. An ultrasound image was saved for documentation purposes. The paracentesis was performed. The catheter was removed and a dressing was applied. The patient tolerated the procedure well without immediate post procedural complication. Patient received post-procedure intravenous albumin; see nursing notes for details. FINDINGS: A total of approximately 4.1 L of clear yellow fluid was removed. Samples were sent to the laboratory as requested by the clinical team. IMPRESSION: Successful ultrasound-guided paracentesis yielding 4.1 liters of peritoneal fluid. Performed by Lynnette Caffey, PA-C PLAN: If the patient eventually requires >/=2 paracenteses in a 30 day period, candidacy for formal evaluation by the [facility] will be assessed. Electronically Signed   By: Richarda Overlie M.D.   On: 03/11/2024 12:58   Scheduled Meds:  cyanocobalamin  500 mcg Oral Daily   folic acid  1 mg Oral Daily   furosemide  40 mg Oral Daily   multivitamin with minerals  1 tablet Oral Daily   pantoprazole  40 mg Oral Daily   phytonadione  5 mg Oral Once   spironolactone  100 mg Oral Daily   thiamine  100 mg Oral Daily   Or   thiamine  100 mg Intravenous Daily   Continuous Infusions:  cefTRIAXone (ROCEPHIN)  IV Stopped (03/12/24 0830)     LOS: 2 days   Time spent: 55 mins  Laini Urick Laural Benes, MD How to contact the [facility] Attending or Consulting provider 7A - 7P or covering provider during after hours 7P -7A, for this patient?  Check the care  team in [facility] and look for a) attending/consulting [facility] provider listed and b) the [facility] team listed Log into [email or web address] to find provider on call.  Locate the [facility] provider you are looking for under [facility] and page to a number that you can be directly reached. If you still have difficulty reaching the provider, please page the [facility] (Director on Call) for the Hospitalists listed on amion for assistance.  03/12/2024, 11:35 AM

## 2024-03-13 LAB — CBC
HCT: 22.4 % — ABNORMAL LOW (ref 39.0–52.0)
Hemoglobin: 7.9 g/dL — ABNORMAL LOW (ref 13.0–17.0)
MCH: 38.3 pg — ABNORMAL HIGH (ref 26.0–34.0)
MCHC: 35.3 g/dL (ref 30.0–36.0)
MCV: 108.7 fL — ABNORMAL HIGH (ref 80.0–100.0)
Platelets: 30 10*3/uL — ABNORMAL LOW (ref 150–400)
RBC: 2.06 MIL/uL — ABNORMAL LOW (ref 4.22–5.81)
RDW: 14.5 % (ref 11.5–15.5)
WBC: 5 10*3/uL (ref 4.0–10.5)
nRBC: 0 % (ref 0.0–0.2)

## 2024-03-13 LAB — COMPREHENSIVE METABOLIC PANEL WITH GFR
ALT: 41 U/L (ref 0–44)
AST: 121 U/L — ABNORMAL HIGH (ref 15–41)
Albumin: 2.2 g/dL — ABNORMAL LOW (ref 3.5–5.0)
Alkaline Phosphatase: 80 U/L (ref 38–126)
Anion gap: 5 (ref 5–15)
BUN: 9 mg/dL (ref 6–20)
CO2: 29 mmol/L (ref 22–32)
Calcium: 7.9 mg/dL — ABNORMAL LOW (ref 8.9–10.3)
Chloride: 96 mmol/L — ABNORMAL LOW (ref 98–111)
Creatinine, Ser: 0.76 mg/dL (ref 0.61–1.24)
GFR, Estimated: >60 mL/min (ref >60)
Glucose, Bld: 104 mg/dL — ABNORMAL HIGH (ref 70–99)
Potassium: 3.6 mmol/L (ref 3.5–5.1)
Sodium: 130 mmol/L — ABNORMAL LOW (ref 135–145)
Total Bilirubin: 3.4 mg/dL — ABNORMAL HIGH (ref 0.0–1.2)
Total Protein: 5.2 g/dL — ABNORMAL LOW (ref 6.5–8.1)

## 2024-03-13 LAB — PROTIME-INR
INR: 2.1 — ABNORMAL HIGH (ref 0.8–1.2)
Prothrombin Time: 23.5 s — ABNORMAL HIGH (ref 11.4–15.2)

## 2024-03-13 LAB — AMMONIA: Ammonia: 40 umol/L — ABNORMAL HIGH (ref 9–35)

## 2024-03-13 LAB — MAGNESIUM: Magnesium: 1.4 mg/dL — ABNORMAL LOW (ref 1.7–2.4)

## 2024-03-13 LAB — CYTOLOGY - NON PAP

## 2024-03-13 MED ORDER — CHLORHEXIDINE GLUCONATE CLOTH 2 % EX PADS
6.0000 | MEDICATED_PAD | Freq: Every day | CUTANEOUS | Status: AC
Start: 2024-03-13 — End: ?
  Administered 2024-03-13 – 2024-03-15 (×3): 6 via TOPICAL

## 2024-03-13 MED ORDER — LORAZEPAM 2 MG/ML IJ SOLN
1.0000 mg | INTRAMUSCULAR | Status: DC | PRN
  Administered 2024-03-13: 1 mg via INTRAVENOUS
  Filled 2024-03-13: qty 1

## 2024-03-13 MED ORDER — LORAZEPAM 1 MG PO TABS
1.0000 mg | ORAL_TABLET | ORAL | Status: DC | PRN
  Administered 2024-03-13 (×2): 1 mg via ORAL
  Administered 2024-03-13: 2 mg via ORAL
  Administered 2024-03-13 – 2024-03-15 (×6): 1 mg via ORAL
  Filled 2024-03-13 (×5): qty 1
  Filled 2024-03-13: qty 2
  Filled 2024-03-13 (×3): qty 1

## 2024-03-13 MED ORDER — MAGNESIUM SULFATE 4 GM/100ML IV SOLN
4.0000 g | Freq: Once | INTRAVENOUS | Status: AC
  Administered 2024-03-13: 4 g via INTRAVENOUS
  Filled 2024-03-13: qty 100

## 2024-03-13 MED ORDER — LACTULOSE 10 GM/15ML PO SOLN
30.0000 g | Freq: Three times a day (TID) | ORAL | Status: DC
  Administered 2024-03-13 – 2024-03-15 (×5): 30 g via ORAL
  Filled 2024-03-13 (×5): qty 60

## 2024-03-13 MED ORDER — HYDROXYZINE HCL 25 MG PO TABS: 25.0000 mg | ORAL_TABLET | Freq: Three times a day (TID) | ORAL | Status: DC | PRN

## 2024-03-13 MED ORDER — HYDROXYZINE HCL 25 MG PO TABS
25.0000 mg | ORAL_TABLET | Freq: Three times a day (TID) | ORAL | Status: DC | PRN
  Administered 2024-03-13: 25 mg via ORAL
  Filled 2024-03-13: qty 1

## 2024-03-13 NOTE — Plan of Care (Signed)

## 2024-03-13 NOTE — Progress Notes (Signed)
 Gastroenterology Progress Note   Referring Provider: No ref. provider found Primary Care Physician:  Gregory Murphy, [name] Primary Gastroenterologist: unknown (reports [name]) however no documented visits)  Gregory Murphy ID: Gregory Murphy; [id number]; 1986/01/11    Subjective   Having intermittent abdominal pain but significantly improved from prior.  Reports his lower extremity edema is also significantly improved.  Still has some mild distention in his belly.  Tolerated breakfast okay but had minimal intake.  Denies any pruritus today.  Denies nausea or vomiting.  Continues to report readiness for sobriety.   Objective   Vital signs in last 24 hours Temp:  [98.5 F (36.9 C)-99.5 F (37.5 C)] 99.5 F (37.5 C) (03/28 0337) Pulse Rate:  [69-90] 84 (03/28 0800) Resp:  [16-20] 16 (03/28 0337) BP: (116-136)/(69-75) 136/75 (03/28 0800) SpO2:  [92 %-100 %] 95 % (03/28 0337) Weight:  [54 kg] 54 kg (03/28 0500) Last BM Date : 03/09/24 (Per pt)  Physical Exam General:   Alert and oriented, pleasant Head:  Normocephalic and atraumatic. Eyes: Mild scleral icterus.  Conjuctiva pink.  Mouth:  Without lesions, mucosa pink and moist.  Neck:  Supple, without thyromegaly or masses.  Abdomen:  Bowel sounds present, soft, mild distention.  Mild TTP to RLQ.Marland Kitchen No HSM or hernias noted. No rebound or guarding. No masses appreciated  Extremities:  1+ edema bilateral feet Neurologic:  Alert and  oriented x4;  grossly normal neurologically. Psych:  Alert and cooperative. Normal mood and affect.  Intake/Output from previous day: 03/27 0701 - 03/28 0700 In: 900 [P.O.:900] Out: -  Intake/Output this shift: Total I/O In: 300 [P.O.:300] Out: -   Lab Results  Recent Labs    03/11/24 0442 03/12/24 0436 03/13/24 0457  WBC 5.5 4.4 5.0  HGB 8.9* 8.3* 7.9*  HCT 26.2* 24.2* 22.4*  PLT 36* 25* 30*   BMET Recent Labs    03/11/24 0442 03/12/24 0436 03/13/24 0457  NA 132* 132* 130*  K  3.6 3.7 3.6  CL 94* 95* 96*  CO2 32 28 29  GLUCOSE 109* 91 104*  BUN 8 9 9   CREATININE 0.75 0.69 0.76  CALCIUM 8.3* 8.1* 7.9*   LFT Recent Labs    03/11/24 0442 03/12/24 0436 03/13/24 0457  PROT 6.0* 5.3* 5.2*  ALBUMIN 2.3* 2.4* 2.2*  AST 209* 144* 121*  ALT 70* 47* 41  ALKPHOS 81 54 80  BILITOT 5.3* 5.0* 3.4*   PT/INR Recent Labs    03/12/24 0436 03/13/24 0457  LABPROT 21.7* 23.5*  INR 1.9* 2.1*   Hepatitis Panel No results for input(s): "HEPBSAG", "HCVAB", "HEPAIGM", "HEPBIGM" in the last 72 hours.  Studies/Results US Paracentesis Result Date: 03/11/2024 INDICATION: Gregory Murphy with history of ETOH cirrhosis, recurrent ascites. Request for diagnostic and therapeutic paracentesis. EXAM: ULTRASOUND GUIDED DIAGNOSTIC AND THERAPEUTIC PARACENTESIS MEDICATIONS: 8 mL 1% lidocaine COMPLICATIONS: None immediate. PROCEDURE: Informed written consent was obtained from the Gregory Murphy after a discussion of the risks, benefits and alternatives to treatment. A timeout was performed prior to the initiation of the procedure. Initial ultrasound scanning demonstrates a large amount of ascites within the right lower abdominal quadrant. The right lower abdomen was prepped and draped in the usual sterile fashion. 1% lidocaine was used for local anesthesia. Following this, a 19 gauge, 7-cm, Yueh catheter was introduced. An ultrasound image was saved for documentation purposes. The paracentesis was performed. The catheter was removed and a dressing was applied. The Gregory Murphy tolerated the procedure well without immediate post procedural complication.  Gregory Murphy received post-procedure intravenous albumin; see nursing notes for details. FINDINGS: A total of approximately 4.1 L of clear yellow fluid was removed. Samples were sent to the laboratory as requested by the clinical team. IMPRESSION: Successful ultrasound-guided paracentesis yielding 4.1 liters of peritoneal fluid. Performed by Lynnette Caffey, PA-C PLAN:  If the Gregory Murphy eventually requires >/=2 paracenteses in a 30 day period, candidacy for formal evaluation by the [facility] will be assessed. Electronically Signed   By: Richarda Overlie M.D.   On: 03/11/2024 12:58   CT ABDOMEN PELVIS W CONTRAST Result Date: 03/10/2024 CLINICAL DATA:  Acute, nonlocalized abdominal pain EXAM: CT ABDOMEN AND PELVIS WITH CONTRAST TECHNIQUE: Multidetector CT imaging of the abdomen and pelvis was performed using the standard protocol following bolus administration of intravenous contrast. RADIATION DOSE REDUCTION: This exam was performed according to the departmental dose-optimization program which includes automated exposure control, adjustment of the mA and/or kV according to Gregory Murphy size and/or use of iterative reconstruction technique. CONTRAST:  OMNIPAQUE IOHEXOL 300 MG/ML  SOLN COMPARISON:  Abdominal MRI 03/04/2024 FINDINGS: Lower chest: Small pleural effusions with lower lobe atelectasis. Premature atheromatous calcification of the coronaries. Lower periesophageal varices. Hepatobiliary: Cirrhosis and steatosis. Heterogeneous density of the liver recently evaluated by MRI.Cholelithiasis with very distended gallbladder but no focal wall thickening is seen. Multiple peripheral calcifications favoring adherent stone over mural calcification given eccentric positioning relative to the wall. No bile duct dilatation or detected calculus. Pancreas: Unremarkable. Spleen: Unremarkable. Adrenals/Urinary Tract: Negative adrenals. No hydronephrosis or stone. Unremarkable bladder. Stomach/Bowel: Submucosal low-density thickening of the proximal colon, likely portal colopathy. No bowel obstruction. Vascular/Lymphatic: Premature atheromatous calcification of the aorta and iliacs for age. No mass or adenopathy. Reproductive:No pathologic findings. Other: Large volume ascites which is low-density and seen throughout the abdomen.  Musculoskeletal: No acute abnormalities. IMPRESSION: 1. No acute finding or change from 03/04/2024 MRI. 2. Cirrhosis with large volume ascites. 3. Cholelithiasis with very dilated gallbladder. 4. Small pleural effusions with atelectasis at the lung bases. Electronically Signed   By: Tiburcio Pea M.D.   On: 03/10/2024 06:54   DG Chest Port 1 View Result Date: 03/10/2024 CLINICAL DATA:  Shortness of breath and elevated heart rate EXAM: PORTABLE CHEST 1 VIEW COMPARISON:  09/11/2023 FINDINGS: Low volume chest with band of opacity at the lung bases and small volume pleural fluid. Normal heart size. No pneumothorax. IMPRESSION: Low volume chest with small volume pleural fluid and streaky lower lobe atelectasis. Electronically Signed   By: Tiburcio Pea M.D.   On: 03/10/2024 05:37   MR LIVER W WO CONTRAST Result Date: 03/04/2024 CLINICAL DATA:  Unintended weight loss, cirrhosis EXAM: MRI ABDOMEN WITHOUT AND WITH CONTRAST TECHNIQUE: Multiplanar multisequence MR imaging of the abdomen was performed both before and after the administration of intravenous contrast. CONTRAST:  5mL GADAVIST GADOBUTROL 1 MMOL/ML IV SOLN COMPARISON:  CT abdomen pelvis, 03/01/2024 FINDINGS: Lower chest: Trace pleural effusions. Hepatobiliary: Coarse, nodular cirrhotic morphology of the liver. Profound hepatic steatosis. Distended gallbladder containing tiny gallstones. Gallbladder wall thickening and pericholecystic fluid, nonspecific in the setting of ascites. No biliary ductal dilatation. Pancreas: Unremarkable. No pancreatic ductal dilatation or surrounding inflammatory changes. Spleen: Normal in size without significant abnormality. Adrenals/Urinary Tract: Adrenal glands are unremarkable. Kidneys are normal, without renal calculi, solid lesion, or hydronephrosis. Stomach/Bowel: Stomach is within normal limits. Thickening of the cecum and ascending colon, likely reflecting portal colopathy. Vascular/Lymphatic: Aortic atherosclerosis.  Small gastroesophageal varices. No enlarged abdominal lymph nodes. Other: No abdominal  wall hernia or abnormality. Moderate volume ascites throughout the abdomen. Musculoskeletal: No acute or significant osseous findings. IMPRESSION: 1. Cirrhosis and profound hepatic steatosis. No suspicious liver lesion. 2. Moderate volume ascites throughout the abdomen. 3. Small gastroesophageal varices. 4. Thickening of the cecum and ascending colon, likely reflecting portal colopathy. 5. Distended gallbladder containing tiny gallstones. Gallbladder wall thickening and pericholecystic fluid, nonspecific in the setting of ascites. 6. Trace pleural effusions. Electronically Signed   By: Jearld Lesch M.D.   On: 03/04/2024 09:30   IR Paracentesis Result Date: 03/02/2024 INDICATION: 38 year old male with history of alcoholic cirrhosis presents with abdominal distention, ascites. Request made for diagnostic paracentesis. EXAM: ULTRASOUND GUIDED DIAGNOSTIC PARACENTESIS MEDICATIONS: 10 mL 1% lidocaine COMPLICATIONS: None immediate. PROCEDURE: Informed written consent was obtained from the Gregory Murphy after a discussion of the risks, benefits and alternatives to treatment. A timeout was performed prior to the initiation of the procedure. Initial ultrasound scanning demonstrates a small amount of ascites within the right lower abdominal quadrant. The right lower abdomen was prepped and draped in the usual sterile fashion. 1% lidocaine was used for local anesthesia. Following this, a 19 gauge, 7-cm, Yueh catheter was introduced. An ultrasound image was saved for documentation purposes. The paracentesis was performed. The catheter was removed and a dressing was applied. The Gregory Murphy tolerated the procedure well without immediate post procedural complication. FINDINGS: A total of approximately 1.0 liters of yellow fluid was removed. Samples were sent to the laboratory as requested by the clinical team. IMPRESSION: Successful ultrasound-guided  paracentesis yielding 1.0 liters of peritoneal fluid. Electronically Signed   By: Corlis Leak M.D.   On: 03/02/2024 16:01   CT ABDOMEN PELVIS W CONTRAST Result Date: 03/01/2024 CLINICAL DATA:  Abdominal pain EXAM: CT ABDOMEN AND PELVIS WITH CONTRAST TECHNIQUE: Multidetector CT imaging of the abdomen and pelvis was performed using the standard protocol following bolus administration of intravenous contrast. RADIATION DOSE REDUCTION: This exam was performed according to the departmental dose-optimization program which includes automated exposure control, adjustment of the mA and/or kV according to Gregory Murphy size and/or use of iterative reconstruction technique. CONTRAST:  75mL OMNIPAQUE IOHEXOL 350 MG/ML SOLN COMPARISON:  09/09/2023 FINDINGS: Lower chest: No acute findings. Hepatobiliary: Severe diffuse low-density throughout the liver compatible with fatty infiltration. Nodular contours of the liver compatible with cirrhosis. Multiple layering gallstones within the gallbladder. No biliary ductal dilatation. Pancreas: No focal abnormality or ductal dilatation. Spleen: No focal abnormality.  Normal size. Adrenals/Urinary Tract: No adrenal abnormality. No focal renal abnormality. No stones or hydronephrosis. Urinary bladder is unremarkable. Stomach/Bowel: Wall thickening within the right colon felt to reflect portal colopathy. Stomach and small bowel decompressed. No bowel obstruction. Vascular/Lymphatic: Aortic atherosclerosis. No evidence of aneurysm or adenopathy. Distal esophageal varices noted compatible with portal venous hypertension. Reproductive: No visible focal abnormality. Other: Moderate to large volume ascites in the abdomen or pelvis. Musculoskeletal: No acute bony abnormality. IMPRESSION: Severe hepatic steatosis with cirrhosis. Associated distal esophageal varices and moderate to large volume ascites. Cholelithiasis.  No CT evidence of acute cholecystitis. Electronically Signed   By: Charlett Nose M.D.    On: 03/01/2024 19:10   XR Finger Thumb Left Result Date: 02/27/2024 X-rays of the left thumb demonstrate distal phalanx fracture with appropriate placement of the pin across the fracture site, and extend across the IP joint with appropriate positioning within the proximal phalanx.  XR Finger Thumb Left Result Date: 02/18/2024 X-rays of the left thumb were obtained X-rays demonstrate stable appearance of the distal phalanx  fracture with pin well located across the IP joint.  Fracture comminution is visualized.    Assessment  38 y.o. male with a history of alcohol abuse, TBI, depression, panic attacks, cholelithiasis, decompensated cirrhosis complicated by portal hypertension with ascites, varices, profound thrombocytopenia with distended gallbladder on imaging and recent hospitalization earlier this month for abdominal pain and nausea/vomiting treated empirically for SBP and underwent paracentesis yielding 1 L with no evidence of SBP on fluid analysis.  He also had EtOH hepatitis during that admission was treated supportively with plans to start diuretics for management of ascites and ultimately left AMA therefore was not discharged with medication.  He represented to the ED with worsening abdominal pain and distention as well as shortness of breath.  GI consulted for further evaluation.  Cirrhosis:  -Currently decompensated with portal hypertension (ascites and varices), coagulopathy, and chronic normocytic anemia.  Likely etiology continues to be secondary to alcohol. -Prior workup with positive AMA, negative ASMA and AMA.  IgG 1964, IgA 608, IgM 361 (IgA and IgG elevation can be seen in the setting of EtOH use).  Ceruloplasmin 16.4 (normal). -Viral hepatitis lab for positive hep C antibody but negative RNA.  aFP 7.3 -MRI liver without suspicious liver lesions. -Ammonia has been mildly elevated this admission although no evidence of HE, need to closely monitor for change in mental status -Varices  noted on imaging.  Is in need of EGD outpatient.  -Given signs of portal hypertension with ascites and varices, recommend starting NSBB on discharge. -MELD 3.0: 23>>23>>24  Alcoholic hepatitis:  -Previously hospitalized 3/16 - 3/20 for alcoholic hepatitis.  DF 78.2 with PT control 13 at that time. -DF 43.9>>45>> 51.7 (PT control 13) or 30.9 (PT control 11) -Steroids not started given concern for compliance and ongoing alcohol use given previously left AMA and continued to drink. -Bilirubin: 5.3>>5>>3.4 -No reported pruritus today.  Ascites, abdominal pain:  -Recurrent ascites present.  Her last hospital admission he left AMA and was not discharged with diuretics. -Lasix 40 and Aldactone 100 started this admission given lower extremity edema -Paracentesis performed with removal of 4.1 L -Current lower extremity edema remains improved. -Previously discussed importance of 2 g sodium diet.  Anemia:  -Chronic -Initial hemoglobin stable at 11.  Has dropped this admission to 8.3 yesterday, down to 7.9 today. -Ferritin elevated, likely reactant secondary to alcohol intake.  Iron 133, saturation 101. -Folate deficiency, replacement started along with thiamine -No prior endoscopic evaluations -Recommended replacing B12. -No overt bleeding reported.  If noted, could consider inpatient procedures.  Thrombocytopenia:  -Chronic -Typically ranging 50-60 range -Platelets as low as 18 on prior admission -This admission 51>>36>>25>>30  Plan / Recommendations  Complete alcohol cessation If active bleeding or platelets <10 did recommend transfusion. 2 g sodium diet Monitor for signs of HE Continue folic acid, thiamine daily  Continue Lasix 40 mg and Aldactone 100 mg daily Currently no need for SBP prophylaxis on discharge, continue Rocephin daily for now. Needs follow-up with primary GI to start NSBB (possible nadolol) Outpatient EGD -Gregory Murphy denies ever seeing [facility], currently lives in  [place].  Likely will have better luck with follow-up by continuing to follow with our team.    LOS: 3 days    03/13/2024, 11:23 AM   Brooke Bonito, MSN, FNP-BC, AGACNP-BC [facility]

## 2024-03-13 NOTE — Progress Notes (Signed)
 Notified that patient has become disoriented tonight and has not moved his bowels in 3-4 days.   He is mildly tremulous on exam and does have asterixis. He is oriented to person, place, and month but had forgotten why he was admitted.   Per charge nurse, CIWA score of 7 or greater has to be managed in the ICU at this facility.   Plan to start lactulose and transfer to ICU.

## 2024-03-13 NOTE — Progress Notes (Signed)
 PROGRESS NOTE   Darcey Demma  UJW:[id number] DOB: January 11, 1986 DOA: 03/10/2024 PCP: Patient, [name]   Chief Complaint  Patient presents with   Abdominal Pain   Level of care: Telemetry  Brief Admission History:  38 y.o. male with medical history significant for alcohol abuse with associated alcoholic liver cirrhosis and ascites, TBI, depression/panic attacks, and severe thrombocytopenia who presented to the ED with worsening abdominal pain and distention along with shortness of breath over the last 1 day.  He was recently hospitalized 3/16 - 3/20 at [facility] with abdominal pain as well as alcohol withdrawal and decompensated cirrhosis with ascites.  He underwent paracentesis with 1 L of fluid removed and no SBP noted at that time.  He unfortunately left AGAINST MEDICAL ADVICE with no medications prescribed on discharge on 3/20.  He is currently complaining of being thirsty, but denies any chest pain, fevers, or chills.  He claims he has been drinking alcohol on a regular basis with 4-5 beers daily. with elevated heart rates, but stable blood pressures and no fever. UDS positive for benzodiazepines and alcohol level 250. Hemoglobin 11 and platelets of 51,000 with INR 1.6. Noted findings of cirrhosis with large volume ascites on CT scan. He has been started on Dilaudid for pain management as well as Rocephin. He was admitted for decompensated alcoholic liver disease.   Assessment and Plan:  Decompensated alcoholic liver cirrhosis with concern for SBP -pt scheduled for paracentesis 3/26 -Appreciate GI recommendations -Continue Rocephin empirically -daily MELD labs -nadolol to be started outpatient per GI  -EGD outpatient with [facility] given notation of esophageal varices  Concern for SBP - follow up fluid testing from paracentesis - culture: no growth to date - continue daily ceftriaxone for now  Hyperbilirubinemia - Pt is symptomatic with intense pruritus symptoms - consider  starting cholestyramine, defer to GI team - downtrending  Alcohol abuse with withdrawal -CIWA protocol with close monitoring -Counseled on cessation -pt remains committed to being sober    Tobacco abuse -Counseled on cessation   Chronic macrocytic anemia-stable -Likely related to folate deficiency as well as bone marrow suppression -Continue supplements   Severe thrombocytopenia -Secondary to alcohol use -Had recent platelet transfusion during hospitalization at [facility], reconsider as needed -Avoid heparin/antiplatelet agents -Monitor CBC - platelets slightly improved; transfuse for <10 per GI -no evidence of active bleeding  Elevated PT/INR -secondary to advanced liver cirrhosis -vitamin K 5 mg x 1 dose on 3/27 -INR 2.1 today; repeat dose of vitamin K   Alcoholic gastritis -PPI   DVT prophylaxis: SCDs Code Status: Full  Family Communication:  Disposition: home when ok with GI team   Consultants:  GI   Procedures:  Paracentesis 03/11/24  Antimicrobials:  Ceftriaxone 3/25>>   Subjective: Pt says his abdomen not hurting this morning.  He says he remains committed to being sober.   Objective: Vitals:   03/13/24 0400 03/13/24 0500 03/13/24 0800 03/13/24 1056  BP: 118/69  136/75 121/83  Pulse: 86  84 (!) 111  Resp:      Temp:      TempSrc:      SpO2:      Weight:  54 kg    Height:        Intake/Output Summary (Last 24 hours) at 03/13/2024 1245 Last data filed at 03/13/2024 0830 Gross per 24 hour  Intake 900 ml  Output --  Net 900 ml   Filed Weights   03/10/24 0400 03/10/24 0857 03/13/24 0500  Weight: 50 kg 55.4 kg 54 kg   Examination:  General exam: Appears calm but Uncomfortable  Respiratory system: Clear to auscultation. Respiratory effort normal. Cardiovascular system: normal S1 & S2 heard. No JVD, murmurs, rubs, gallops or clicks. No pedal edema. Gastrointestinal system: Abdomen is much less distended, soft and nontender. No organomegaly or  masses felt. Normal bowel sounds heard. Central nervous system: Alert and oriented. No focal neurological deficits. Extremities: Symmetric 5 x 5 power. Skin: jaundiced Psychiatry: Judgement and insight appear diminished. Mood & affect normal.    Data Reviewed: I have personally reviewed following labs and imaging studies  CBC: Recent Labs  Lab 03/10/24 0414 03/11/24 0442 03/12/24 0436 03/13/24 0457  WBC 6.5 5.5 4.4 5.0  NEUTROABS 3.0  --   --   --   HGB 11.0* 8.9* 8.3* 7.9*  HCT 30.1* 26.2* 24.2* 22.4*  MCV 107.1* 111.5* 110.0* 108.7*  PLT 51* 36* 25* 30*    Basic Metabolic Panel: Recent Labs  Lab 03/10/24 0414 03/11/24 0442 03/12/24 0436 03/13/24 0457  NA 135 132* 132* 130*  K 4.0 3.6 3.7 3.6  CL 98 94* 95* 96*  CO2 25 32 28 29  GLUCOSE 156* 109* 91 104*  BUN 7 8 9 9   CREATININE 0.60* 0.75 0.69 0.76  CALCIUM 8.4* 8.3* 8.1* 7.9*  MG 2.1 1.7  --  1.4*    CBG: No results for input(s): "GLUCAP" in the last 168 hours.  Recent Results (from the past 240 hours)  Culture, blood (Routine X 2) w Reflex to ID Panel     Status: None (Preliminary result)   Collection Time: 03/10/24  5:15 AM   Specimen: BLOOD  Result Value Ref Range Status   Specimen Description BLOOD RIGHT ANTECUBITAL  Final   Special Requests   Final    BOTTLES DRAWN AEROBIC AND ANAEROBIC Blood Culture adequate volume   Culture   Final    NO GROWTH 3 DAYS Performed at [facility], [address], [place], [place] [address]    Report Status PENDING  Incomplete  Culture, blood (Routine X 2) w Reflex to ID Panel     Status: None (Preliminary result)   Collection Time: 03/10/24  5:21 AM   Specimen: BLOOD  Result Value Ref Range Status   Specimen Description BLOOD BLOOD RIGHT HAND  Final   Special Requests   Final    BOTTLES DRAWN AEROBIC AND ANAEROBIC Blood Culture adequate volume   Culture   Final    NO GROWTH 3 DAYS Performed at [facility], [address] [place], [place] [address]    Report  Status PENDING  Incomplete  Gram stain     Status: None   Collection Time: 03/11/24 11:30 AM   Specimen: Ascitic  Result Value Ref Range Status   Specimen Description ASCITIC  Final   Special Requests NONE  Final   Gram Stain   Final    ASCITIC NO ORGANISMS SEEN WBC PRESENT, PREDOMINANTLY MONONUCLEAR CYTOSPIN SMEAR Performed at [facility], [address] [place], [place] [address]    Report Status 03/11/2024 FINAL  Final  Culture, body fluid w Gram Stain-bottle     Status: None (Preliminary result)   Collection Time: 03/11/24 11:30 AM   Specimen: Ascitic  Result Value Ref Range Status   Specimen Description ASCITIC  Final   Special Requests BOTTLES DRAWN AEROBIC AND ANAEROBIC 10CC  Final   Culture   Final    NO GROWTH 2 DAYS Performed at [facility], [address] [place], [place] [address]    Report Status PENDING  Incomplete     Radiology Studies: No results found.  Scheduled Meds:  cyanocobalamin  500 mcg Oral Daily   folic acid  1 mg Oral Daily   furosemide  40 mg Oral Daily   multivitamin with minerals  1 tablet Oral Daily   pantoprazole  40 mg Oral Daily   spironolactone  100 mg Oral Daily   thiamine  100 mg Oral Daily   Or   thiamine  100 mg Intravenous Daily   Continuous Infusions:  cefTRIAXone (ROCEPHIN)  IV 2 g (03/13/24 9811)   magnesium sulfate bolus IVPB 4 g (03/13/24 1058)     LOS: 3 days   Time spent: 55 mins  Franchelle Foskett Laural Benes, MD How to contact the [facility] Attending or Consulting provider 7A - 7P or covering provider during after hours 7P -7A, for this patient?  Check the care team in [facility] and look for a) attending/consulting TRH provider listed and b) the [facility] team listed Log into [email or web address] to find provider on call.  Locate the [facility] provider you are looking for under [facility] and page to a number that you can be directly reached. If you still have difficulty reaching the provider, please page the [facility] (Director on Call) for the  Hospitalists listed on amion for assistance.  03/13/2024, 12:45 PM

## 2024-03-13 NOTE — Progress Notes (Signed)
 eLink Physician-Brief Progress Note Patient Name: Gregory Murphy DOB: [birth year] MRN: [id number]   Date of Service  03/13/2024  HPI/Events of Note  Patient admitted to the hospital due to decompensated ETOH liver disease, he went into DT's and was transferred to the ICU for closer monitoring.  eICU Interventions  New Patient Evaluation.        Thomasene Lot Sherman Lipuma 03/13/2024, 10:30 PM

## 2024-03-13 NOTE — Plan of Care (Signed)

## 2024-03-14 LAB — MAGNESIUM: Magnesium: 2.1 mg/dL (ref 1.7–2.4)

## 2024-03-14 LAB — COMPREHENSIVE METABOLIC PANEL WITH GFR
ALT: 37 U/L (ref 0–44)
AST: 98 U/L — ABNORMAL HIGH (ref 15–41)
Albumin: 2.2 g/dL — ABNORMAL LOW (ref 3.5–5.0)
Alkaline Phosphatase: 65 U/L (ref 38–126)
Anion gap: 7 (ref 5–15)
BUN: 9 mg/dL (ref 6–20)
CO2: 28 mmol/L (ref 22–32)
Calcium: 8 mg/dL — ABNORMAL LOW (ref 8.9–10.3)
Chloride: 97 mmol/L — ABNORMAL LOW (ref 98–111)
Creatinine, Ser: 0.83 mg/dL (ref 0.61–1.24)
GFR, Estimated: >60 mL/min (ref >60)
Glucose, Bld: 111 mg/dL — ABNORMAL HIGH (ref 70–99)
Potassium: 3.6 mmol/L (ref 3.5–5.1)
Sodium: 132 mmol/L — ABNORMAL LOW (ref 135–145)
Total Bilirubin: 3.8 mg/dL — ABNORMAL HIGH (ref 0.0–1.2)
Total Protein: 5.3 g/dL — ABNORMAL LOW (ref 6.5–8.1)

## 2024-03-14 LAB — CBC
HCT: 23.7 % — ABNORMAL LOW (ref 39.0–52.0)
Hemoglobin: 8.1 g/dL — ABNORMAL LOW (ref 13.0–17.0)
MCH: 37.9 pg — ABNORMAL HIGH (ref 26.0–34.0)
MCHC: 34.2 g/dL (ref 30.0–36.0)
MCV: 110.7 fL — ABNORMAL HIGH (ref 80.0–100.0)
Platelets: 30 10*3/uL — ABNORMAL LOW (ref 150–400)
RBC: 2.14 MIL/uL — ABNORMAL LOW (ref 4.22–5.81)
RDW: 15 % (ref 11.5–15.5)
WBC: 5.2 10*3/uL (ref 4.0–10.5)
nRBC: 0 % (ref 0.0–0.2)

## 2024-03-14 LAB — PROTIME-INR
INR: 2 — ABNORMAL HIGH (ref 0.8–1.2)
Prothrombin Time: 22.5 s — ABNORMAL HIGH (ref 11.4–15.2)

## 2024-03-14 LAB — MRSA NEXT GEN BY PCR, NASAL: MRSA by PCR Next Gen: NOT DETECTED

## 2024-03-14 LAB — AMMONIA: Ammonia: 45 umol/L — ABNORMAL HIGH (ref 9–35)

## 2024-03-14 MED ORDER — CHLORDIAZEPOXIDE HCL 5 MG PO CAPS
5.0000 mg | ORAL_CAPSULE | Freq: Three times a day (TID) | ORAL | Status: DC
  Administered 2024-03-14 – 2024-03-15 (×2): 5 mg via ORAL
  Filled 2024-03-14 (×2): qty 1

## 2024-03-14 MED ORDER — PHYTONADIONE 5 MG PO TABS
10.0000 mg | ORAL_TABLET | Freq: Once | ORAL | Status: AC
  Administered 2024-03-14: 10 mg via ORAL
  Filled 2024-03-14: qty 2

## 2024-03-14 MED ORDER — CHLORDIAZEPOXIDE HCL 5 MG PO CAPS
10.0000 mg | ORAL_CAPSULE | Freq: Three times a day (TID) | ORAL | Status: AC
  Administered 2024-03-14 (×2): 10 mg via ORAL
  Filled 2024-03-14 (×2): qty 2

## 2024-03-14 NOTE — Plan of Care (Signed)

## 2024-03-14 NOTE — Progress Notes (Signed)
 PROGRESS NOTE   Gregory Murphy  OZH:[id number] DOB: [birth year] DOA: 03/10/2024 PCP: Patient, [name]   Chief Complaint  Patient presents with   Abdominal Pain   Level of care: Stepdown  Brief Admission History:  38 y.o. male with medical history significant for alcohol abuse with associated alcoholic liver cirrhosis and ascites, TBI, depression/panic attacks, and severe thrombocytopenia who presented to the ED with worsening abdominal pain and distention along with shortness of breath over the last 1 day.  He was recently hospitalized 3/16 - 3/20 at [facility] with abdominal pain as well as alcohol withdrawal and decompensated cirrhosis with ascites.  He underwent paracentesis with 1 L of fluid removed and no SBP noted at that time.  He unfortunately left AGAINST MEDICAL ADVICE with no medications prescribed on discharge on 3/20.  He is currently complaining of being thirsty, but denies any chest pain, fevers, or chills.  He claims he has been drinking alcohol on a regular basis with 4-5 beers daily. with elevated heart rates, but stable blood pressures and no fever. UDS positive for benzodiazepines and alcohol level 250. Hemoglobin 11 and platelets of 51,000 with INR 1.6. Noted findings of cirrhosis with large volume ascites on CT scan. He has been started on Dilaudid for pain management as well as Rocephin. He was admitted for decompensated alcoholic liver disease.   Assessment and Plan:  Decompensated alcoholic liver cirrhosis with concern for SBP -pt scheduled for paracentesis 3/26 -Appreciate GI recommendations -Continue Rocephin empirically -daily MELD labs -nadolol to be started outpatient per GI  -EGD outpatient with GI given notation of esophageal varices  Concern for SBP - follow up fluid testing from paracentesis - culture: no growth to date - continue daily ceftriaxone for now  Hyperbilirubinemia - Pt is symptomatic with intense pruritus symptoms - started  hydroxyzine for pruritus symptoms per GI team  Alcohol abuse with withdrawal -CIWA protocol with close monitoring -Counseled on cessation -pt remains committed to being sober  -added librium for tremulousness symptoms   Tobacco abuse -Counseled on cessation   Chronic macrocytic anemia-stable -Likely related to folate deficiency as well as bone marrow suppression -Continue supplements   Severe thrombocytopenia -Secondary to alcohol use -Had recent platelet transfusion during hospitalization at [facility], reconsider as needed -Avoid heparin/antiplatelet agents -Monitor CBC - platelets slightly improved; transfuse for <10 per GI -no evidence of active bleeding  Elevated PT/INR -secondary to advanced liver cirrhosis -vitamin K 5 mg x 1 dose on 3/27 -INR 2.1 today; repeat dose of vitamin K 10 mg x 1    Alcoholic gastritis -PPI   DVT prophylaxis: SCDs Code Status: Full  Family Communication:  Disposition: home when ok with GI team   Consultants:  GI   Procedures:  Paracentesis 03/11/24  Antimicrobials:  Ceftriaxone 3/25>>   Subjective: He says she is more awake now and wants to take his medications.  He had a bowel movement this morning.  He is aware of transfer to stepdown unit.     Objective: Vitals:   03/14/24 0500 03/14/24 0700 03/14/24 0747 03/14/24 0800  BP: 126/77 118/69  129/79  Pulse: (!) 55 87  96  Resp: 13 (!) 9  (!) 9  Temp:   98.3 F (36.8 C)   TempSrc:   Oral   SpO2: 97% 92%  94%  Weight: 54.3 kg     Height:        Intake/Output Summary (Last 24 hours) at 03/14/2024 0848 Last data filed at 03/13/2024  1700 Gross per 24 hour  Intake 300 ml  Output --  Net 300 ml   Filed Weights   03/10/24 0857 03/13/24 0500 03/14/24 0500  Weight: 55.4 kg 54 kg 54.3 kg   Examination:  General exam: Appears calm but Uncomfortable  Respiratory system: Clear to auscultation. Respiratory effort normal. Cardiovascular system: normal S1 & S2 heard. No JVD,  murmurs, rubs, gallops or clicks. No pedal edema. Gastrointestinal system: Abdomen is much less distended, soft and nontender. No organomegaly or masses felt. Normal bowel sounds heard. Central nervous system: asterixis present. Alert and oriented. No focal neurological deficits. Extremities: Symmetric 5 x 5 power. Skin: jaundiced Psychiatry: Judgement and insight appear diminished. Mood & affect normal.    Data Reviewed: I have personally reviewed following labs and imaging studies  CBC: Recent Labs  Lab 03/10/24 0414 03/11/24 0442 03/12/24 0436 03/13/24 0457 03/14/24 0447  WBC 6.5 5.5 4.4 5.0 5.2  NEUTROABS 3.0  --   --   --   --   HGB 11.0* 8.9* 8.3* 7.9* 8.1*  HCT 30.1* 26.2* 24.2* 22.4* 23.7*  MCV 107.1* 111.5* 110.0* 108.7* 110.7*  PLT 51* 36* 25* 30* 30*    Basic Metabolic Panel: Recent Labs  Lab 03/10/24 0414 03/11/24 0442 03/12/24 0436 03/13/24 0457 03/14/24 0447  NA 135 132* 132* 130* 132*  K 4.0 3.6 3.7 3.6 3.6  CL 98 94* 95* 96* 97*  CO2 25 32 28 29 28   GLUCOSE 156* 109* 91 104* 111*  BUN 7 8 9 9 9   CREATININE 0.60* 0.75 0.69 0.76 0.83  CALCIUM 8.4* 8.3* 8.1* 7.9* 8.0*  MG 2.1 1.7  --  1.4* 2.1    CBG: No results for input(s): "GLUCAP" in the last 168 hours.  Recent Results (from the past 240 hours)  Culture, blood (Routine X 2) w Reflex to ID Panel     Status: None (Preliminary result)   Collection Time: 03/10/24  5:15 AM   Specimen: BLOOD  Result Value Ref Range Status   Specimen Description BLOOD RIGHT ANTECUBITAL  Final   Special Requests   Final    BOTTLES DRAWN AEROBIC AND ANAEROBIC Blood Culture adequate volume   Culture   Final    NO GROWTH 4 DAYS Performed at [facility], [address], [place], [place] [address]    Report Status PENDING  Incomplete  Culture, blood (Routine X 2) w Reflex to ID Panel     Status: None (Preliminary result)   Collection Time: 03/10/24  5:21 AM   Specimen: BLOOD  Result Value Ref Range Status   Specimen  Description BLOOD BLOOD RIGHT HAND  Final   Special Requests   Final    BOTTLES DRAWN AEROBIC AND ANAEROBIC Blood Culture adequate volume   Culture   Final    NO GROWTH 4 DAYS Performed at [facility], [address] [place], [place] [address]    Report Status PENDING  Incomplete  Gram stain     Status: None   Collection Time: 03/11/24 11:30 AM   Specimen: Ascitic  Result Value Ref Range Status   Specimen Description ASCITIC  Final   Special Requests NONE  Final   Gram Stain   Final    ASCITIC NO ORGANISMS SEEN WBC PRESENT, PREDOMINANTLY MONONUCLEAR CYTOSPIN SMEAR Performed at [facility], [address], [place], [place] [address]    Report Status 03/11/2024 FINAL  Final  Culture, body fluid w Gram Stain-bottle     Status: None (Preliminary result)  Collection Time: 03/11/24 11:30 AM   Specimen: Ascitic  Result Value Ref Range Status   Specimen Description ASCITIC  Final   Special Requests BOTTLES DRAWN AEROBIC AND ANAEROBIC 10CC  Final   Culture   Final    NO GROWTH 3 DAYS Performed at [facility], [address] [place], [place] [address]    Report Status PENDING  Incomplete  MRSA Next Gen by PCR, Nasal     Status: None   Collection Time: 03/13/24  9:00 PM   Specimen: Nasal Mucosa; Nasal Swab  Result Value Ref Range Status   MRSA by PCR Next Gen NOT DETECTED NOT DETECTED Final    Comment: (NOTE) The GeneXpert MRSA Assay (FDA approved for NASAL specimens only), is one component of a comprehensive MRSA colonization surveillance program. It is not intended to diagnose MRSA infection nor to guide or monitor treatment for MRSA infections. Test performance is not FDA approved in patients less than 58 years old. Performed at [facility], [address] [place], [place] [address]      Radiology Studies: No results found.  Scheduled Meds:  chlordiazePOXIDE  10 mg Oral TID   Followed by   chlordiazePOXIDE  5 mg Oral TID   Chlorhexidine Gluconate Cloth  6 each  Topical Daily   cyanocobalamin  500 mcg Oral Daily   folic acid  1 mg Oral Daily   furosemide  40 mg Oral Daily   lactulose  30 g Oral TID   multivitamin with minerals  1 tablet Oral Daily   pantoprazole  40 mg Oral Daily   spironolactone  100 mg Oral Daily   thiamine  100 mg Oral Daily   Or   thiamine  100 mg Intravenous Daily   Continuous Infusions:  cefTRIAXone (ROCEPHIN)  IV 2 g (03/14/24 0834)    LOS: 4 days   Time spent: 53 mins  Denzell Colasanti Laural Benes, MD How to contact the [facility] Attending or Consulting provider 7A - 7P or covering provider during after hours 7P -7A, for this patient?  Check the care team in [facility] and look for a) attending/consulting TRH provider listed and b) the [facility] team listed Log into [email or web address] to find provider on call.  Locate the [facility] provider you are looking for under [facility] and page to a number that you can be directly reached. If you still have difficulty reaching the provider, please page the [facility] (Director on Call) for the Hospitalists listed on amion for assistance.  03/14/2024, 8:48 AM

## 2024-03-14 NOTE — Plan of Care (Signed)

## 2024-03-15 ENCOUNTER — Telehealth: Payer: Self-pay | Admitting: Internal Medicine

## 2024-03-15 LAB — COMPREHENSIVE METABOLIC PANEL WITH GFR
ALT: 33 U/L (ref 0–44)
AST: 85 U/L — ABNORMAL HIGH (ref 15–41)
Albumin: 2.3 g/dL — ABNORMAL LOW (ref 3.5–5.0)
Alkaline Phosphatase: 62 U/L (ref 38–126)
Anion gap: 5 (ref 5–15)
BUN: 9 mg/dL (ref 6–20)
CO2: 27 mmol/L (ref 22–32)
Calcium: 8.2 mg/dL — ABNORMAL LOW (ref 8.9–10.3)
Chloride: 100 mmol/L (ref 98–111)
Creatinine, Ser: 0.69 mg/dL (ref 0.61–1.24)
GFR, Estimated: >60 mL/min (ref >60)
Glucose, Bld: 91 mg/dL (ref 70–99)
Potassium: 3.7 mmol/L (ref 3.5–5.1)
Sodium: 132 mmol/L — ABNORMAL LOW (ref 135–145)
Total Bilirubin: 4 mg/dL — ABNORMAL HIGH (ref 0.0–1.2)
Total Protein: 5.6 g/dL — ABNORMAL LOW (ref 6.5–8.1)

## 2024-03-15 LAB — CBC
HCT: 25.7 % — ABNORMAL LOW (ref 39.0–52.0)
Hemoglobin: 8.8 g/dL — ABNORMAL LOW (ref 13.0–17.0)
MCH: 37.9 pg — ABNORMAL HIGH (ref 26.0–34.0)
MCHC: 34.2 g/dL (ref 30.0–36.0)
MCV: 110.8 fL — ABNORMAL HIGH (ref 80.0–100.0)
Platelets: 35 10*3/uL — ABNORMAL LOW (ref 150–400)
RBC: 2.32 MIL/uL — ABNORMAL LOW (ref 4.22–5.81)
RDW: 15.2 % (ref 11.5–15.5)
WBC: 5.3 10*3/uL (ref 4.0–10.5)
nRBC: 0 % (ref 0.0–0.2)

## 2024-03-15 LAB — PROTIME-INR
INR: 1.7 — ABNORMAL HIGH (ref 0.8–1.2)
Prothrombin Time: 20.6 s — ABNORMAL HIGH (ref 11.4–15.2)

## 2024-03-15 LAB — CULTURE, BLOOD (ROUTINE X 2)
Culture: NO GROWTH
Culture: NO GROWTH
Special Requests: ADEQUATE
Special Requests: ADEQUATE

## 2024-03-15 LAB — MAGNESIUM: Magnesium: 1.9 mg/dL (ref 1.7–2.4)

## 2024-03-15 MED ORDER — FOLIC ACID 1 MG PO TABS: 1.0000 mg | ORAL_TABLET | Freq: Every day | ORAL | 2 refills | Status: DC

## 2024-03-15 MED ORDER — SPIRONOLACTONE 100 MG PO TABS: 100.0000 mg | ORAL_TABLET | Freq: Every day | ORAL | 1 refills | Status: DC

## 2024-03-15 MED ORDER — HYDROXYZINE HCL 25 MG PO TABS: 25.0000 mg | ORAL_TABLET | Freq: Three times a day (TID) | ORAL | 0 refills | Status: DC | PRN

## 2024-03-15 MED ORDER — ADULT MULTIVITAMIN W/MINERALS CH: 1.0000 | ORAL_TABLET | Freq: Every day | ORAL | Status: DC

## 2024-03-15 MED ORDER — FUROSEMIDE 40 MG PO TABS: 40.0000 mg | ORAL_TABLET | Freq: Every day | ORAL | 1 refills | Status: DC

## 2024-03-15 MED ORDER — CHLORDIAZEPOXIDE HCL 5 MG PO CAPS: ORAL_CAPSULE | ORAL | 0 refills | Status: AC

## 2024-03-15 MED ORDER — PANTOPRAZOLE SODIUM 40 MG PO TBEC: 40.0000 mg | DELAYED_RELEASE_TABLET | Freq: Every day | ORAL | 1 refills | Status: DC

## 2024-03-15 MED ORDER — VITAMIN B-1 100 MG PO TABS: 100.0000 mg | ORAL_TABLET | Freq: Every day | ORAL | 1 refills | Status: DC

## 2024-03-15 MED ORDER — LACTULOSE 10 GM/15ML PO SOLN: 10.0000 g | Freq: Two times a day (BID) | ORAL | 0 refills | Status: DC

## 2024-03-15 NOTE — Discharge Summary (Signed)
 Physician Discharge Summary  Gregory Murphy WRU:[id number] DOB: June 11, 1986 DOA: 03/10/2024   Admit date: 03/10/2024 Discharge date: 03/15/2024  Admitted From:  Home  Disposition: Home (declines residential alcohol treatment)   Recommendations for Outpatient Follow-up:  Establish care with a  PCP in 2 weeks Follow up with GI in 2-3 weeks  Please consider residential alcohol treatment program Avoid alcohol and recreational drugs  Discharge Condition: STABLE   CODE STATUS: FULL DIET: 2 gram sodium restriction    Brief Hospitalization Summary: Please see all hospital notes, images, labs for full details of the hospitalization. Admission provider HPI:  38 y.o. male with medical history significant for alcohol abuse with associated alcoholic liver cirrhosis and ascites, TBI, depression/panic attacks, and severe thrombocytopenia who presented to the ED with worsening abdominal pain and distention along with shortness of breath over the last 1 day.  He was recently hospitalized 3/16 - 3/20 at [facility] with abdominal pain as well as alcohol withdrawal and decompensated cirrhosis with ascites.  He underwent paracentesis with 1 L of fluid removed and no SBP noted at that time.  He unfortunately left AGAINST MEDICAL ADVICE with no medications prescribed on discharge on 3/20.  He is currently complaining of being thirsty, but denies any chest pain, fevers, or chills.  He claims he has been drinking alcohol on a regular basis with 4-5 beers daily. with elevated heart rates, but stable blood pressures and no fever. UDS positive for benzodiazepines and alcohol level 250. Hemoglobin 11 and platelets of 51,000 with INR 1.6. Noted findings of cirrhosis with large volume ascites on CT scan. He has been started on Dilaudid for pain management as well as Rocephin. He was admitted for decompensated alcoholic liver disease.  Hospital Course by Listed Problem  Decompensated alcoholic liver cirrhosis with concern  for SBP -pt scheduled for paracentesis 3/26 -Appreciate GI recommendations -he was treated with Rocephin empirically -daily MELD labs were followed  -nadolol to be started outpatient per GI  -EGD outpatient with GI given notation of esophageal varices -GI team have him on lasix 40 mg daily and spironolactone 100 mg daily; lactulose    Concern for SBP - follow up fluid testing from paracentesis - culture: no growth to date - pt was treated with daily ceftriaxone - per GI consultants no need to continue SBP prophylaxis at discharge   Hyperbilirubinemia - Pt is symptomatic with intense pruritus symptoms - started hydroxyzine for pruritus symptoms per GI team   Alcohol abuse with withdrawal -CIWA protocol with close monitoring while in hospital  -Counseled on cessation -pt remains committed to being sober  -discharged on librium taper to help him to not start drinking again -pt was offered but has repeatedly declined residential alcohol treatment -social worker/case mgmt provided him with several different treatment resources   Tobacco abuse -Counseled on cessation   Chronic macrocytic anemia-stable -Likely related to folate deficiency as well as bone marrow suppression -Continue supplements   Severe thrombocytopenia -Secondary to alcohol use -Had recent platelet transfusion during hospitalization at [facility], reconsider as needed -Avoid heparin/antiplatelet agents -Monitor CBC - platelets slightly improved; transfuse for <10 per GI -no evidence of active bleeding   Elevated PT/INR -secondary to advanced liver cirrhosis -vitamin K 5 mg x 1 dose on 3/27 -INR 2.1 today; repeat dose of vitamin K 10 mg x 1    Alcoholic gastritis -PPI   Discharge Diagnoses:  Principal Problem:   Decompensated cirrhosis (HCC) Active Problems:   Alcohol withdrawal (HCC)  Alcoholic cirrhosis of liver with ascites (HCC)   Alcoholic gastritis   Severe thrombocytopenia (HCC)   Tobacco  abuse  Discharge Instructions: Discharge Instructions     Ambulatory referral to Gastroenterology   Complete by: As directed    Hospital follow up   What is the reason for referral?: Other      Allergies as of 03/15/2024       Reactions   Latex Itching        Medication List     TAKE these medications    chlordiazePOXIDE 5 MG capsule Commonly known as: LIBRIUM Take 1 capsule (5 mg total) by mouth 4 (four) times daily for 2 days, THEN 1 capsule (5 mg total) 3 (three) times daily for 3 days, THEN 1 capsule (5 mg total) 2 (two) times daily for 3 days, THEN 1 capsule (5 mg total) daily for 3 days. DO NOT TAKE THIS WITH ALCOHOL OR ANY RECREATIONAL SUBSTANCES.. Start taking on: March 15, 2024   folic acid 1 MG tablet Commonly known as: FOLVITE Take 1 tablet (1 mg total) by mouth daily. Start taking on: March 16, 2024   furosemide 40 MG tablet Commonly known as: LASIX Take 1 tablet (40 mg total) by mouth daily. Start taking on: March 16, 2024   hydrOXYzine 25 MG tablet Commonly known as: ATARAX Take 1-2 tablets (25-50 mg total) by mouth 3 (three) times daily as needed for itching.   lactulose 10 GM/15ML solution Commonly known as: CHRONULAC Take 15 mLs (10 g total) by mouth 2 (two) times daily. WITH GOAL TO HAVE 3 BOWEL MOVEMENTS DAILY.   multivitamin with minerals Tabs tablet Take 1 tablet by mouth daily. Start taking on: March 16, 2024   pantoprazole 40 MG tablet Commonly known as: PROTONIX Take 1 tablet (40 mg total) by mouth daily. Start taking on: March 16, 2024   spironolactone 100 MG tablet Commonly known as: ALDACTONE Take 1 tablet (100 mg total) by mouth daily. Start taking on: March 16, 2024   thiamine 100 MG tablet Commonly known as: Vitamin B-1 Take 1 tablet (100 mg total) by mouth daily. Start taking on: March 16, 2024   vitamin B-12 500 MCG tablet Commonly known as: CYANOCOBALAMIN Take 500 mcg by mouth daily.        Follow-up Information      [facility]. Schedule an appointment as soon as possible for a visit in 2 week(s).   Why: Hospital Follow Up Contact information: [address] [place] [address] [phone]               Allergies  Allergen Reactions   Latex Itching   Allergies as of 03/15/2024       Reactions   Latex Itching        Medication List     TAKE these medications    chlordiazePOXIDE 5 MG capsule Commonly known as: LIBRIUM Take 1 capsule (5 mg total) by mouth 4 (four) times daily for 2 days, THEN 1 capsule (5 mg total) 3 (three) times daily for 3 days, THEN 1 capsule (5 mg total) 2 (two) times daily for 3 days, THEN 1 capsule (5 mg total) daily for 3 days. DO NOT TAKE THIS WITH ALCOHOL OR ANY RECREATIONAL SUBSTANCES.. Start taking on: March 15, 2024   folic acid 1 MG tablet Commonly known as: FOLVITE Take 1 tablet (1 mg total) by mouth daily. Start taking on: March 16, 2024   furosemide 40 MG tablet Commonly  known as: LASIX Take 1 tablet (40 mg total) by mouth daily. Start taking on: March 16, 2024   hydrOXYzine 25 MG tablet Commonly known as: ATARAX Take 1-2 tablets (25-50 mg total) by mouth 3 (three) times daily as needed for itching.   lactulose 10 GM/15ML solution Commonly known as: CHRONULAC Take 15 mLs (10 g total) by mouth 2 (two) times daily. WITH GOAL TO HAVE 3 BOWEL MOVEMENTS DAILY.   multivitamin with minerals Tabs tablet Take 1 tablet by mouth daily. Start taking on: March 16, 2024   pantoprazole 40 MG tablet Commonly known as: PROTONIX Take 1 tablet (40 mg total) by mouth daily. Start taking on: March 16, 2024   spironolactone 100 MG tablet Commonly known as: ALDACTONE Take 1 tablet (100 mg total) by mouth daily. Start taking on: March 16, 2024   thiamine 100 MG tablet Commonly known as: Vitamin B-1 Take 1 tablet (100 mg total) by mouth daily. Start taking on: March 16, 2024   vitamin B-12 500 MCG  tablet Commonly known as: CYANOCOBALAMIN Take 500 mcg by mouth daily.        Procedures/Studies: US Paracentesis Result Date: 03/11/2024 INDICATION: Patient with history of ETOH cirrhosis, recurrent ascites. Request for diagnostic and therapeutic paracentesis. EXAM: ULTRASOUND GUIDED DIAGNOSTIC AND THERAPEUTIC PARACENTESIS MEDICATIONS: 8 mL 1% lidocaine COMPLICATIONS: None immediate. PROCEDURE: Informed written consent was obtained from the patient after a discussion of the risks, benefits and alternatives to treatment. A timeout was performed prior to the initiation of the procedure. Initial ultrasound scanning demonstrates a large amount of ascites within the right lower abdominal quadrant. The right lower abdomen was prepped and draped in the usual sterile fashion. 1% lidocaine was used for local anesthesia. Following this, a 19 gauge, 7-cm, Yueh catheter was introduced. An ultrasound image was saved for documentation purposes. The paracentesis was performed. The catheter was removed and a dressing was applied. The patient tolerated the procedure well without immediate post procedural complication. Patient received post-procedure intravenous albumin; see nursing notes for details. FINDINGS: A total of approximately 4.1 L of clear yellow fluid was removed. Samples were sent to the laboratory as requested by the clinical team. IMPRESSION: Successful ultrasound-guided paracentesis yielding 4.1 liters of peritoneal fluid. Performed by Lynnette Caffey, PA-C PLAN: If the patient eventually requires >/=2 paracenteses in a 30 day period, candidacy for formal evaluation by the [facility] will be assessed. Electronically Signed   By: Richarda Overlie M.D.   On: 03/11/2024 12:58   CT ABDOMEN PELVIS W CONTRAST Result Date: 03/10/2024 CLINICAL DATA:  Acute, nonlocalized abdominal pain EXAM: CT ABDOMEN AND PELVIS WITH CONTRAST TECHNIQUE: Multidetector CT imaging of  the abdomen and pelvis was performed using the standard protocol following bolus administration of intravenous contrast. RADIATION DOSE REDUCTION: This exam was performed according to the departmental dose-optimization program which includes automated exposure control, adjustment of the mA and/or kV according to patient size and/or use of iterative reconstruction technique. CONTRAST:  OMNIPAQUE IOHEXOL 300 MG/ML  SOLN COMPARISON:  Abdominal MRI 03/04/2024 FINDINGS: Lower chest: Small pleural effusions with lower lobe atelectasis. Premature atheromatous calcification of the coronaries. Lower periesophageal varices. Hepatobiliary: Cirrhosis and steatosis. Heterogeneous density of the liver recently evaluated by MRI.Cholelithiasis with very distended gallbladder but no focal wall thickening is seen. Multiple peripheral calcifications favoring adherent stone over mural calcification given eccentric positioning relative to the wall. No bile duct dilatation or detected calculus. Pancreas: Unremarkable. Spleen: Unremarkable. Adrenals/Urinary Tract: Negative adrenals. No hydronephrosis  or stone. Unremarkable bladder. Stomach/Bowel: Submucosal low-density thickening of the proximal colon, likely portal colopathy. No bowel obstruction. Vascular/Lymphatic: Premature atheromatous calcification of the aorta and iliacs for age. No mass or adenopathy. Reproductive:No pathologic findings. Other: Large volume ascites which is low-density and seen throughout the abdomen. Musculoskeletal: No acute abnormalities. IMPRESSION: 1. No acute finding or change from 03/04/2024 MRI. 2. Cirrhosis with large volume ascites. 3. Cholelithiasis with very dilated gallbladder. 4. Small pleural effusions with atelectasis at the lung bases. Electronically Signed   By: Tiburcio Pea M.D.   On: 03/10/2024 06:54   DG Chest Port 1 View Result Date: 03/10/2024 CLINICAL DATA:  Shortness of breath and elevated heart rate EXAM: PORTABLE CHEST 1 VIEW  COMPARISON:  09/11/2023 FINDINGS: Low volume chest with band of opacity at the lung bases and small volume pleural fluid. Normal heart size. No pneumothorax. IMPRESSION: Low volume chest with small volume pleural fluid and streaky lower lobe atelectasis. Electronically Signed   By: Tiburcio Pea M.D.   On: 03/10/2024 05:37   MR LIVER W WO CONTRAST Result Date: 03/04/2024 CLINICAL DATA:  Unintended weight loss, cirrhosis EXAM: MRI ABDOMEN WITHOUT AND WITH CONTRAST TECHNIQUE: Multiplanar multisequence MR imaging of the abdomen was performed both before and after the administration of intravenous contrast. CONTRAST:  5mL GADAVIST GADOBUTROL 1 MMOL/ML IV SOLN COMPARISON:  CT abdomen pelvis, 03/01/2024 FINDINGS: Lower chest: Trace pleural effusions. Hepatobiliary: Coarse, nodular cirrhotic morphology of the liver. Profound hepatic steatosis. Distended gallbladder containing tiny gallstones. Gallbladder wall thickening and pericholecystic fluid, nonspecific in the setting of ascites. No biliary ductal dilatation. Pancreas: Unremarkable. No pancreatic ductal dilatation or surrounding inflammatory changes. Spleen: Normal in size without significant abnormality. Adrenals/Urinary Tract: Adrenal glands are unremarkable. Kidneys are normal, without renal calculi, solid lesion, or hydronephrosis. Stomach/Bowel: Stomach is within normal limits. Thickening of the cecum and ascending colon, likely reflecting portal colopathy. Vascular/Lymphatic: Aortic atherosclerosis. Small gastroesophageal varices. No enlarged abdominal lymph nodes. Other: No abdominal wall hernia or abnormality. Moderate volume ascites throughout the abdomen. Musculoskeletal: No acute or significant osseous findings. IMPRESSION: 1. Cirrhosis and profound hepatic steatosis. No suspicious liver lesion. 2. Moderate volume ascites throughout the abdomen. 3. Small gastroesophageal varices. 4. Thickening of the cecum and ascending colon, likely reflecting portal  colopathy. 5. Distended gallbladder containing tiny gallstones. Gallbladder wall thickening and pericholecystic fluid, nonspecific in the setting of ascites. 6. Trace pleural effusions. Electronically Signed   By: Jearld Lesch M.D.   On: 03/04/2024 09:30   IR Paracentesis Result Date: 03/02/2024 INDICATION: 38 year old male with history of alcoholic cirrhosis presents with abdominal distention, ascites. Request made for diagnostic paracentesis. EXAM: ULTRASOUND GUIDED DIAGNOSTIC PARACENTESIS MEDICATIONS: 10 mL 1% lidocaine COMPLICATIONS: None immediate. PROCEDURE: Informed written consent was obtained from the patient after a discussion of the risks, benefits and alternatives to treatment. A timeout was performed prior to the initiation of the procedure. Initial ultrasound scanning demonstrates a small amount of ascites within the right lower abdominal quadrant. The right lower abdomen was prepped and draped in the usual sterile fashion. 1% lidocaine was used for local anesthesia. Following this, a 19 gauge, 7-cm, Yueh catheter was introduced. An ultrasound image was saved for documentation purposes. The paracentesis was performed. The catheter was removed and a dressing was applied. The patient tolerated the procedure well without immediate post procedural complication. FINDINGS: A total of approximately 1.0 liters of yellow fluid was removed. Samples were sent to the laboratory as requested by the clinical team. IMPRESSION: Successful ultrasound-guided  paracentesis yielding 1.0 liters of peritoneal fluid. Electronically Signed   By: Corlis Leak M.D.   On: 03/02/2024 16:01   CT ABDOMEN PELVIS W CONTRAST Result Date: 03/01/2024 CLINICAL DATA:  Abdominal pain EXAM: CT ABDOMEN AND PELVIS WITH CONTRAST TECHNIQUE: Multidetector CT imaging of the abdomen and pelvis was performed using the standard protocol following bolus administration of intravenous contrast. RADIATION DOSE REDUCTION: This exam was performed  according to the departmental dose-optimization program which includes automated exposure control, adjustment of the mA and/or kV according to patient size and/or use of iterative reconstruction technique. CONTRAST:  75mL OMNIPAQUE IOHEXOL 350 MG/ML SOLN COMPARISON:  09/09/2023 FINDINGS: Lower chest: No acute findings. Hepatobiliary: Severe diffuse low-density throughout the liver compatible with fatty infiltration. Nodular contours of the liver compatible with cirrhosis. Multiple layering gallstones within the gallbladder. No biliary ductal dilatation. Pancreas: No focal abnormality or ductal dilatation. Spleen: No focal abnormality.  Normal size. Adrenals/Urinary Tract: No adrenal abnormality. No focal renal abnormality. No stones or hydronephrosis. Urinary bladder is unremarkable. Stomach/Bowel: Wall thickening within the right colon felt to reflect portal colopathy. Stomach and small bowel decompressed. No bowel obstruction. Vascular/Lymphatic: Aortic atherosclerosis. No evidence of aneurysm or adenopathy. Distal esophageal varices noted compatible with portal venous hypertension. Reproductive: No visible focal abnormality. Other: Moderate to large volume ascites in the abdomen or pelvis. Musculoskeletal: No acute bony abnormality. IMPRESSION: Severe hepatic steatosis with cirrhosis. Associated distal esophageal varices and moderate to large volume ascites. Cholelithiasis.  No CT evidence of acute cholecystitis. Electronically Signed   By: Charlett Nose M.D.   On: 03/01/2024 19:10   XR Finger Thumb Left Result Date: 02/27/2024 X-rays of the left thumb demonstrate distal phalanx fracture with appropriate placement of the pin across the fracture site, and extend across the IP joint with appropriate positioning within the proximal phalanx.  XR Finger Thumb Left Result Date: 02/18/2024 X-rays of the left thumb were obtained X-rays demonstrate stable appearance of the distal phalanx fracture with pin well located  across the IP joint.  Fracture comminution is visualized.     Subjective: Pt says he is going home today, he says he does not want inpatient alcohol treatment at this time, says he wants to go home, says he will follow up with GI, says he will discuss further alcohol treatment after he has gone home.    Discharge Exam: Vitals:   03/15/24 0640 03/15/24 0743  BP: 131/85   Pulse: 89   Resp:    Temp:  98.4 F (36.9 C)  SpO2:     Vitals:   03/15/24 0555 03/15/24 0600 03/15/24 0640 03/15/24 0743  BP: 125/76 131/85 131/85   Pulse: 91 84 89   Resp:  (!) 9    Temp:    98.4 F (36.9 C)  TempSrc:    Oral  SpO2:  96%    Weight:      Height:       General exam: Appears calm awake, oriented x 3, ambulating in room.   Respiratory system: Clear to auscultation. Respiratory effort normal. Cardiovascular system: normal S1 & S2 heard. No JVD, murmurs, rubs, gallops or clicks. No pedal edema. Gastrointestinal system: Abdomen is much less distended, soft and nontender. No organomegaly or masses felt. Normal bowel sounds heard. Central nervous system:  Alert and oriented. No focal neurological deficits. Extremities: Symmetric 5 x 5 power. Skin: jaundiced Psychiatry: Mood & affect normal.     The results of significant diagnostics from this hospitalization (including imaging, microbiology, ancillary  and laboratory) are listed below for reference.     Microbiology: Recent Results (from the past 240 hours)  Culture, blood (Routine X 2) w Reflex to ID Panel     Status: None (Preliminary result)   Collection Time: 03/10/24  5:15 AM   Specimen: BLOOD  Result Value Ref Range Status   Specimen Description BLOOD RIGHT ANTECUBITAL  Final   Special Requests   Final    BOTTLES DRAWN AEROBIC AND ANAEROBIC Blood Culture adequate volume   Culture   Final    NO GROWTH 4 DAYS Performed at [facility], [address] [place], [place] [address]    Report Status PENDING  Incomplete  Culture, blood  (Routine X 2) w Reflex to ID Panel     Status: None (Preliminary result)   Collection Time: 03/10/24  5:21 AM   Specimen: BLOOD  Result Value Ref Range Status   Specimen Description BLOOD BLOOD RIGHT HAND  Final   Special Requests   Final    BOTTLES DRAWN AEROBIC AND ANAEROBIC Blood Culture adequate volume   Culture   Final    NO GROWTH 4 DAYS Performed at [facility], [address], [place], [place] [address]    Report Status PENDING  Incomplete  Gram stain     Status: None   Collection Time: 03/11/24 11:30 AM   Specimen: Ascitic  Result Value Ref Range Status   Specimen Description ASCITIC  Final   Special Requests NONE  Final   Gram Stain   Final    ASCITIC NO ORGANISMS SEEN WBC PRESENT, PREDOMINANTLY MONONUCLEAR CYTOSPIN SMEAR Performed at [facility], [address], [place], [place] [address]    Report Status 03/11/2024 FINAL  Final  Culture, body fluid w Gram Stain-bottle     Status: None (Preliminary result)   Collection Time: 03/11/24 11:30 AM   Specimen: Ascitic  Result Value Ref Range Status   Specimen Description ASCITIC  Final   Special Requests BOTTLES DRAWN AEROBIC AND ANAEROBIC 10CC  Final   Culture   Final    NO GROWTH 3 DAYS Performed at [facility], [address] [place], [place] [address]    Report Status PENDING  Incomplete  MRSA Next Gen by PCR, Nasal     Status: None   Collection Time: 03/13/24  9:00 PM   Specimen: Nasal Mucosa; Nasal Swab  Result Value Ref Range Status   MRSA by PCR Next Gen NOT DETECTED NOT DETECTED Final    Comment: (NOTE) The GeneXpert MRSA Assay (FDA approved for NASAL specimens only), is one component of a comprehensive MRSA colonization surveillance program. It is not intended to diagnose MRSA infection nor to guide or monitor treatment for MRSA infections. Test performance is not FDA approved in patients less than 64 years old. Performed at [facility], [address], [place], [place] [address]      Labs: BNP  (last 3 results) Recent Labs    03/10/24 0413  BNP 30.0   Basic Metabolic Panel: Recent Labs  Lab 03/10/24 0414 03/11/24 0442 03/12/24 0436 03/13/24 0457 03/14/24 0447 03/15/24 0518  NA 135 132* 132* 130* 132* 132*  K 4.0 3.6 3.7 3.6 3.6 3.7  CL 98 94* 95* 96* 97* 100  CO2 25 32 28 29 28 27   GLUCOSE 156* 109* 91 104* 111* 91  BUN 7 8 9 9 9 9   CREATININE 0.60* 0.75 0.69 0.76 0.83 0.69  CALCIUM 8.4* 8.3* 8.1* 7.9* 8.0* 8.2*  MG 2.1 1.7  --  1.4* 2.1 1.9   Liver Function Tests: Recent Labs  Lab 03/11/24 0442 03/12/24 0436 03/13/24 0457 03/14/24 0447 03/15/24 0518  AST 209* 144* 121* 98* 85*  ALT 70* 47* 41 37 33  ALKPHOS 81 54 80 65 62  BILITOT 5.3* 5.0* 3.4* 3.8* 4.0*  PROT 6.0* 5.3* 5.2* 5.3* 5.6*  ALBUMIN 2.3* 2.4* 2.2* 2.2* 2.3*   Recent Labs  Lab 03/10/24 0414  LIPASE 46   Recent Labs  Lab 03/10/24 0515 03/11/24 0442 03/12/24 0436 03/13/24 0457 03/14/24 0738  AMMONIA 49* 75* 34 40* 45*   CBC: Recent Labs  Lab 03/10/24 0414 03/11/24 0442 03/12/24 0436 03/13/24 0457 03/14/24 0447 03/15/24 0518  WBC 6.5 5.5 4.4 5.0 5.2 5.3  NEUTROABS 3.0  --   --   --   --   --   HGB 11.0* 8.9* 8.3* 7.9* 8.1* 8.8*  HCT 30.1* 26.2* 24.2* 22.4* 23.7* 25.7*  MCV 107.1* 111.5* 110.0* 108.7* 110.7* 110.8*  PLT 51* 36* 25* 30* 30* 35*   Cardiac Enzymes: No results for input(s): "CKTOTAL", "CKMB", "CKMBINDEX", "TROPONINI" in the last 168 hours. BNP: Invalid input(s): "POCBNP" CBG: No results for input(s): "GLUCAP" in the last 168 hours. D-Dimer No results for input(s): "DDIMER" in the last 72 hours. Hgb A1c No results for input(s): "HGBA1C" in the last 72 hours. Lipid Profile No results for input(s): "CHOL", "HDL", "LDLCALC", "TRIG", "CHOLHDL", "LDLDIRECT" in the last 72 hours. Thyroid function studies No results for input(s): "TSH", "T4TOTAL", "T3FREE", "THYROIDAB" in the last 72 hours.  Invalid input(s): "FREET3" Anemia work up No results for input(s):  "VITAMINB12", "FOLATE", "FERRITIN", "TIBC", "IRON", "RETICCTPCT" in the last 72 hours. Urinalysis    Component Value Date/Time   COLORURINE AMBER (A) 03/10/2024 0426   APPEARANCEUR CLEAR 03/10/2024 0426   LABSPEC 1.030 03/10/2024 0426   PHURINE 5.0 03/10/2024 0426   GLUCOSEU NEGATIVE 03/10/2024 0426   HGBUR NEGATIVE 03/10/2024 0426   BILIRUBINUR SMALL (A) 03/10/2024 0426   KETONESUR NEGATIVE 03/10/2024 0426   PROTEINUR 30 (A) 03/10/2024 0426   NITRITE NEGATIVE 03/10/2024 0426   LEUKOCYTESUR NEGATIVE 03/10/2024 0426   Sepsis Labs Recent Labs  Lab 03/12/24 0436 03/13/24 0457 03/14/24 0447 03/15/24 0518  WBC 4.4 5.0 5.2 5.3   Microbiology Recent Results (from the past 240 hours)  Culture, blood (Routine X 2) w Reflex to ID Panel     Status: None (Preliminary result)   Collection Time: 03/10/24  5:15 AM   Specimen: BLOOD  Result Value Ref Range Status   Specimen Description BLOOD RIGHT ANTECUBITAL  Final   Special Requests   Final    BOTTLES DRAWN AEROBIC AND ANAEROBIC Blood Culture adequate volume   Culture   Final    NO GROWTH 4 DAYS Performed at [facility], [address], [place], [place] [address]    Report Status PENDING  Incomplete  Culture, blood (Routine X 2) w Reflex to ID Panel     Status: None (Preliminary result)   Collection Time: 03/10/24  5:21 AM   Specimen: BLOOD  Result Value Ref Range Status   Specimen Description BLOOD BLOOD RIGHT HAND  Final   Special Requests   Final    BOTTLES DRAWN AEROBIC AND ANAEROBIC Blood Culture adequate volume   Culture   Final    NO GROWTH 4 DAYS Performed at [facility], [address] [place], [place] [address]    Report Status PENDING  Incomplete  Gram stain     Status: None  Collection Time: 03/11/24 11:30 AM   Specimen: Ascitic  Result Value Ref Range Status   Specimen Description ASCITIC  Final   Special Requests NONE  Final   Gram Stain   Final    ASCITIC NO ORGANISMS SEEN WBC PRESENT, PREDOMINANTLY  MONONUCLEAR CYTOSPIN SMEAR Performed at [facility], [address] [place], [place] [address]    Report Status 03/11/2024 FINAL  Final  Culture, body fluid w Gram Stain-bottle     Status: None (Preliminary result)   Collection Time: 03/11/24 11:30 AM   Specimen: Ascitic  Result Value Ref Range Status   Specimen Description ASCITIC  Final   Special Requests BOTTLES DRAWN AEROBIC AND ANAEROBIC 10CC  Final   Culture   Final    NO GROWTH 3 DAYS Performed at [facility], [address], [place], [place] [address]    Report Status PENDING  Incomplete  MRSA Next Gen by PCR, Nasal     Status: None   Collection Time: 03/13/24  9:00 PM   Specimen: Nasal Mucosa; Nasal Swab  Result Value Ref Range Status   MRSA by PCR Next Gen NOT DETECTED NOT DETECTED Final    Comment: (NOTE) The GeneXpert MRSA Assay (FDA approved for NASAL specimens only), is one component of a comprehensive MRSA colonization surveillance program. It is not intended to diagnose MRSA infection nor to guide or monitor treatment for MRSA infections. Test performance is not FDA approved in patients less than 68 years old. Performed at [facility], [address], [place], [place] [address]    Time coordinating discharge: 45 mins  SIGNED:  Standley Dakins, MD  [facility] 03/15/2024, 10:55 AM How to contact the [facility] Attending or Consulting provider 7A - 7P or covering provider during after hours 7P -7A, for this patient?  Check the care team in [facility] and look for a) attending/consulting [facility] provider listed and b) the [facility] team listed Log into [email or web address] and use [facility]'s universal password to access. If you do not have the password, please contact the hospital operator. Locate the [facility] provider you are looking for under [facility] and page to a number that you can be directly reached. If you still have difficulty reaching the provider, please page the [facility] (Director on Call) for the Hospitalists listed on  amion for assistance.

## 2024-03-15 NOTE — TOC CM/SW Note (Signed)
 CM met with patient at bedside after progression rounds. CM asked patient if he had insurance. Patient reported he does not have insurance. Patient reported he can pay for $4.00 prescription at [facility]. TOC to follow up with prescription assistance.

## 2024-03-15 NOTE — Telephone Encounter (Signed)
 Patient being discharged today.  He needs outpatient follow-up.  Previously seen by [facility] in the hospital though is not established with them in the outpatient setting.  Patient also lives in [place].  I think it would be best if we made follow-up arrangements in our office.  Please have him see Dr. Levon Hedger, [place], [place], [place], or [place] in the next 2 to 3 weeks.  Thank you

## 2024-03-16 LAB — CULTURE, BODY FLUID W GRAM STAIN -BOTTLE: Culture: NO GROWTH

## 2024-03-20 ENCOUNTER — Encounter (HOSPITAL_COMMUNITY): Payer: Self-pay

## 2024-03-20 ENCOUNTER — Other Ambulatory Visit: Payer: Self-pay

## 2024-03-20 ENCOUNTER — Emergency Department (HOSPITAL_COMMUNITY)
Admission: EM | Admit: 2024-03-20 | Discharge: 2024-03-20 | Disposition: A | Discharge status: Home / Self Care | Payer: MEDICAID | Attending: Emergency Medicine | Admitting: Emergency Medicine

## 2024-03-20 DIAGNOSIS — R1084 Generalized abdominal pain: Secondary | ICD-10-CM | POA: Diagnosis not present

## 2024-03-20 DIAGNOSIS — R1032 Left lower quadrant pain: Secondary | ICD-10-CM | POA: Diagnosis present

## 2024-03-20 HISTORY — DX: Alcoholic cirrhosis of liver without ascites: K70.30

## 2024-03-20 LAB — CBC WITH DIFFERENTIAL/PLATELET
Abs Immature Granulocytes: 0.02 10*3/uL (ref 0.00–0.07)
Basophils Absolute: 0.1 10*3/uL (ref 0.0–0.1)
Basophils Relative: 2 %
Eosinophils Absolute: 0.2 10*3/uL (ref 0.0–0.5)
Eosinophils Relative: 3 %
HCT: 28.9 % — ABNORMAL LOW (ref 39.0–52.0)
Hemoglobin: 10 g/dL — ABNORMAL LOW (ref 13.0–17.0)
Immature Granulocytes: 0 %
Lymphocytes Relative: 41 %
Lymphs Abs: 2.5 10*3/uL (ref 0.7–4.0)
MCH: 37.9 pg — ABNORMAL HIGH (ref 26.0–34.0)
MCHC: 34.6 g/dL (ref 30.0–36.0)
MCV: 109.5 fL — ABNORMAL HIGH (ref 80.0–100.0)
Monocytes Absolute: 0.6 10*3/uL (ref 0.1–1.0)
Monocytes Relative: 10 %
Neutro Abs: 2.7 10*3/uL (ref 1.7–7.7)
Neutrophils Relative %: 44 %
Platelets: 62 10*3/uL — ABNORMAL LOW (ref 150–400)
RBC: 2.64 MIL/uL — ABNORMAL LOW (ref 4.22–5.81)
RDW: 14.7 % (ref 11.5–15.5)
WBC: 6 10*3/uL (ref 4.0–10.5)
nRBC: 0 % (ref 0.0–0.2)

## 2024-03-20 LAB — PROTIME-INR
INR: 1.4 — ABNORMAL HIGH (ref 0.8–1.2)
Prothrombin Time: 17.2 s — ABNORMAL HIGH (ref 11.4–15.2)

## 2024-03-20 LAB — MAGNESIUM: Magnesium: 1.6 mg/dL — ABNORMAL LOW (ref 1.7–2.4)

## 2024-03-20 LAB — COMPREHENSIVE METABOLIC PANEL WITH GFR
ALT: 37 U/L (ref 0–44)
AST: 97 U/L — ABNORMAL HIGH (ref 15–41)
Albumin: 2.4 g/dL — ABNORMAL LOW (ref 3.5–5.0)
Alkaline Phosphatase: 65 U/L (ref 38–126)
Anion gap: 9 (ref 5–15)
BUN: 9 mg/dL (ref 6–20)
CO2: 28 mmol/L (ref 22–32)
Calcium: 8.6 mg/dL — ABNORMAL LOW (ref 8.9–10.3)
Chloride: 102 mmol/L (ref 98–111)
Creatinine, Ser: 1.03 mg/dL (ref 0.61–1.24)
GFR, Estimated: >60 mL/min (ref >60)
Glucose, Bld: 103 mg/dL — ABNORMAL HIGH (ref 70–99)
Potassium: 3.2 mmol/L — ABNORMAL LOW (ref 3.5–5.1)
Sodium: 139 mmol/L (ref 135–145)
Total Bilirubin: 3.6 mg/dL — ABNORMAL HIGH (ref 0.0–1.2)
Total Protein: 6.6 g/dL (ref 6.5–8.1)

## 2024-03-20 LAB — AMMONIA: Ammonia: 40 umol/L — ABNORMAL HIGH (ref 9–35)

## 2024-03-20 LAB — LIPASE, BLOOD: Lipase: 111 U/L — ABNORMAL HIGH (ref 11–51)

## 2024-03-20 NOTE — ED Provider Notes (Signed)
 [facility] EMERGENCY DEPARTMENT AT [facility] Provider Note   CSN: [id number] Arrival date & time: 03/20/24  0865     History  Chief Complaint  Patient presents with   Abdominal Pain    Gregory Murphy is a 37 y.o. male.  With past medical history of alcohol withdrawal, alcohol hepatitis with ascites, alcoholic gastritis, HCV presenting to emergency room with complaint of generalized weakness for few days associated with left upper quadrant pain.  Patient reports he has history of cirrhosis and he still drinks alcohol.  Reports his abdomen feels distended.  He is concerned he needs another paracentesis.  He has not had any nausea or vomiting.  He reports regular bowel movements.  He is felt kind of drowsy and generalized weakness. Denies hallucinations. Denies focal weakness. Denies chest pain, SOB, cough, fever or chills.    Abdominal Pain      Home Medications Prior to Admission medications   Medication Sig Start Date End Date Taking? Authorizing Provider  chlordiazePOXIDE (LIBRIUM) 5 MG capsule Take 1 capsule (5 mg total) by mouth 4 (four) times daily for 2 days, THEN 1 capsule (5 mg total) 3 (three) times daily for 3 days, THEN 1 capsule (5 mg total) 2 (two) times daily for 3 days, THEN 1 capsule (5 mg total) daily for 3 days. DO NOT TAKE THIS WITH ALCOHOL OR ANY RECREATIONAL SUBSTANCES.. 03/15/24 03/26/24  Johnson, Clanford L, MD  folic acid (FOLVITE) 1 MG tablet Take 1 tablet (1 mg total) by mouth daily. 03/16/24   Johnson, Clanford L, MD  furosemide (LASIX) 40 MG tablet Take 1 tablet (40 mg total) by mouth daily. 03/16/24   Johnson, Clanford L, MD  hydrOXYzine (ATARAX) 25 MG tablet Take 1-2 tablets (25-50 mg total) by mouth 3 (three) times daily as needed for itching. 03/15/24   Johnson, Clanford L, MD  lactulose (CHRONULAC) 10 GM/15ML solution Take 15 mLs (10 g total) by mouth 2 (two) times daily. WITH GOAL TO HAVE 3 BOWEL MOVEMENTS DAILY. 03/15/24   Cleora Fleet, MD  Multiple Vitamin (MULTIVITAMIN WITH MINERALS) TABS tablet Take 1 tablet by mouth daily. 03/16/24   Johnson, Clanford L, MD  pantoprazole (PROTONIX) 40 MG tablet Take 1 tablet (40 mg total) by mouth daily. 03/16/24   Johnson, Clanford L, MD  spironolactone (ALDACTONE) 100 MG tablet Take 1 tablet (100 mg total) by mouth daily. 03/16/24   Johnson, Clanford L, MD  thiamine (VITAMIN B-1) 100 MG tablet Take 1 tablet (100 mg total) by mouth daily. 03/16/24   Johnson, Clanford L, MD  vitamin B-12 (CYANOCOBALAMIN) 500 MCG tablet Take 500 mcg by mouth daily.    [provider]      Allergies    Latex    Review of Systems   Review of Systems  Gastrointestinal:  Positive for abdominal pain.    Physical Exam Updated Vital Signs BP 125/85   Pulse 99   Temp 98.4 F (36.9 C) (Oral)   Resp 20   Ht 5\' 1"  (1.549 m)   Wt 52.6 kg   SpO2 96%   BMI 21.91 kg/m  Physical Exam Vitals and nursing note reviewed.  Constitutional:      General: He is not in acute distress.    Appearance: He is not toxic-appearing.  HENT:     Head: Normocephalic and atraumatic.  Eyes:     General: No scleral icterus.    Conjunctiva/sclera: Conjunctivae normal.  Cardiovascular:     Rate  and Rhythm: Normal rate and regular rhythm.     Pulses: Normal pulses.     Heart sounds: Normal heart sounds.  Pulmonary:     Effort: Pulmonary effort is normal. No respiratory distress.     Breath sounds: Normal breath sounds.  Abdominal:     General: Abdomen is flat. Bowel sounds are normal. There is distension.     Palpations: Abdomen is soft.     Tenderness: There is no abdominal tenderness.  Skin:    General: Skin is warm and dry.     Findings: No lesion.  Neurological:     General: No focal deficit present.     Mental Status: He is alert and oriented to person, place, and time. Mental status is at baseline.     ED Results / Procedures / Treatments   Labs (all labs ordered are listed, but only abnormal  results are displayed) Labs Reviewed  COMPREHENSIVE METABOLIC PANEL WITH GFR - Abnormal; Notable for the following components:      Result Value   Potassium 3.2 (*)    Glucose, Bld 103 (*)    Calcium 8.6 (*)    Albumin 2.4 (*)    AST 97 (*)    Total Bilirubin 3.6 (*)    All other components within normal limits  LIPASE, BLOOD - Abnormal; Notable for the following components:   Lipase 111 (*)    All other components within normal limits  MAGNESIUM - Abnormal; Notable for the following components:   Magnesium 1.6 (*)    All other components within normal limits  PROTIME-INR - Abnormal; Notable for the following components:   Prothrombin Time 17.2 (*)    INR 1.4 (*)    All other components within normal limits  AMMONIA - Abnormal; Notable for the following components:   Ammonia 40 (*)    All other components within normal limits  CBC WITH DIFFERENTIAL/PLATELET  URINALYSIS, ROUTINE W REFLEX MICROSCOPIC    EKG None  Radiology No results found.  Procedures Procedures    Medications Ordered in ED Medications - No data to display  ED Course/ Medical Decision Making/ A&P                                 Medical Decision Making Amount and/or Complexity of Data Reviewed Labs: ordered.   This patient presents to the ED for concern of abdominal pain, this involves an extensive number of treatment options, and is a complaint that carries with it a high risk of complications and morbidity.  The differential diagnosis includes sinusitis, cholecystitis, small bowel obstruction, gastroenteritis, kidney stone, gastritis    Co morbidities that complicate the patient evaluation  Hepatitis with ascites and cirrhosis    Additional history obtained:  Additional history obtained from 03/10/34 ED visit    Lab Tests:  I personally interpreted labs.  The pertinent results include:   CBC without leukocytosis.  Hemoglobin is 10, platelets 62 Lipase 111 Ammonia 40 INR stable at  1.4 Mag 1.6, Potassium 3.2, bili 3.6 - stable    Imaging Studies ordered:  Considered imaging however patient has had multiple CTs for our similar complaint.  Would like to avoid secondary to no acute change in symptoms and no anasarca or ascites on exam.    Cardiac Monitoring: / EKG:  The patient was maintained on a cardiac monitor.    Problem List / ED Course / Critical interventions / Medication  management  Patient coming with abdominal distention.  He reports this has been ongoing for a few days.  He has history of cirrhosis.  On exam he is hemodynamically stable and well-appearing.  He is not encephalopathic.  He has no sign of active withdrawal.  He has no focal deficits.  He is tolerating oral intake in room.  Lungs clear to auscultation.  He has no focal area of abdominal tenderness does have some mild distention, but no anasarca.  Will check labs.  Patient denies need for nausea or pain medication.  Family member in room expresses understanding agrees to plan.  I would like to hold off on CT imaging at this time as patient has had multiple CTs over the past few months for similar complain.  Reassessed, patient requesting food, has tolerated water, well. Discussed labs with patient.  Dr Eloise Harman agreed to see patient and feels patient is appropriate for discharge with GI follow up. Given return precautions. Patient expressing understanding and agrees to plan.   I have reviewed the patients home medicines and have made adjustments as needed   Plan  F/u w/ PCP in 2-3d to ensure resolution of sx.  Patient was given return precautions. Patient stable for discharge at this time.  Patient educated on sx/dx and verbalized understanding of plan. Return to ER w/ new or worsening sx.          Final Clinical Impression(s) / ED Diagnoses Final diagnoses:  Generalized abdominal pain    Rx / DC Orders ED Discharge Orders     None         Gregory Murphy 03/21/24  1158    Rondel Baton, MD 03/22/24 1450

## 2024-03-20 NOTE — ED Triage Notes (Signed)
 Pt reports LUQ pain with a hx of cirrhosis and he believes he may need fluid drained off his abdomen again.

## 2024-03-20 NOTE — Discharge Instructions (Addendum)
 You are seen in emergency room today.  Please continue taking your medications as prescribed.  No new medication changes have been made today.  Please call Dr. Rusty Aus office Monday morning to schedule follow up. Return to ER with new or worsening symptoms. Makes sure you are taking Protonix 40mg  for abdominal discomfort.

## 2024-03-22 NOTE — Progress Notes (Unsigned)
 GI Office Note    Referring Provider: Cleora Fleet, MD Primary Care Physician:  Patient, [name]  Primary Gastroenterologist: Katrinka Blazing, MD   Chief Complaint   No chief complaint on file.   History of Present Illness   Gregory Murphy is a 38 y.o. male presenting today for hospital follow up. Seen back two weeks ago during admission with decompensated cirrhosis complicated by portal HTN with ascites, varices, presenting with abd pain with distention and SOB. Also seen at [facility] (3/16-3/20) for ascites recently, work up neg for SBP. Left AMA. Noted to have component of etoh hepatitis. Due to poor compliance he was not started on steroids on most recent hospitliazatiaon.   Course complicated by severe thrombocytopenia with platelets down in the 18,000 range.    ANA positive, ASMA negative, AMA negative, hepatitis A antibody total positive, hepatitis B surface antigen, surface antibody, and core antibody total all negative, hepatitis C antibody positive but hepatitis C RNA negative. AFP elevated at 7.3. MRI liver with and without contrast 3/19 showing cirrhosis, profound hepatic steatosis, no suspicious liver lesion, moderate volume ascites, small gastroesophageal varices, thickening of the cecum and ascending colon likely reflecting portal colopathy, descending colon are containing tiny stones, gallbladder wall thickening and pericholecystic fluid, nonspecific in the setting of ascites.  IgG 1964, IgA 608, IgM 361, A1A normal. HCV Ab positive with neg HCV RNA. Ceruloplasmin 16.4.      MELD 3.0 of 24. DF significantly elevated but due to leaving AMA previously and continued etoh comliance was felt to be an issue therefore steroids not started.  Dischareged 3/31. In ED 4/4 with LUQ pain.  EGD:  Never BB: No ***start at d/c Diuretics: None  History of SBP:  No Encephalopathy:  None   No brbpr or melena. Bowels moving normally, 1 per day. Occasional  heartburn, but nothing routine. No dysphagia or hematemesis.      NSAIDs:  None.  ETOH: 4-5 beer daily. States he has naltrexone at home, but hasn't taken it.  Illicit drug use: Used to use heroin. Last used 5 years ago.  Tobacco: 1 pack per 2 days.     CT A/P with contrast 02/2024:  IMPRESSION: 1. No acute finding or change from 03/04/2024 MRI. 2. Cirrhosis with large volume ascites. 3. Cholelithiasis with very dilated gallbladder. 4. Small pleural effusions with atelectasis at the lung bases.  Medications   Current Outpatient Medications  Medication Sig Dispense Refill   chlordiazePOXIDE (LIBRIUM) 5 MG capsule Take 1 capsule (5 mg total) by mouth 4 (four) times daily for 2 days, THEN 1 capsule (5 mg total) 3 (three) times daily for 3 days, THEN 1 capsule (5 mg total) 2 (two) times daily for 3 days, THEN 1 capsule (5 mg total) daily for 3 days. DO NOT TAKE THIS WITH ALCOHOL OR ANY RECREATIONAL SUBSTANCES.. 26 capsule 0   folic acid (FOLVITE) 1 MG tablet Take 1 tablet (1 mg total) by mouth daily. 30 tablet 2   furosemide (LASIX) 40 MG tablet Take 1 tablet (40 mg total) by mouth daily. 30 tablet 1   hydrOXYzine (ATARAX) 25 MG tablet Take 1-2 tablets (25-50 mg total) by mouth 3 (three) times daily as needed for itching. 30 tablet 0   lactulose (CHRONULAC) 10 GM/15ML solution Take 15 mLs (10 g total) by mouth 2 (two) times daily. WITH GOAL TO HAVE 3 BOWEL MOVEMENTS DAILY. 946 mL 0   Multiple Vitamin (MULTIVITAMIN WITH MINERALS) TABS  tablet Take 1 tablet by mouth daily.     pantoprazole (PROTONIX) 40 MG tablet Take 1 tablet (40 mg total) by mouth daily. 30 tablet 1   spironolactone (ALDACTONE) 100 MG tablet Take 1 tablet (100 mg total) by mouth daily. 30 tablet 1   thiamine (VITAMIN B-1) 100 MG tablet Take 1 tablet (100 mg total) by mouth daily. 30 tablet 1   vitamin B-12 (CYANOCOBALAMIN) 500 MCG tablet Take 500 mcg by mouth daily.     No current facility-administered medications for this  visit.    Allergies   Allergies as of 03/23/2024 - Review Complete 03/20/2024  Allergen Reaction Noted   Latex Itching 03/10/2024     Past Medical History   Past Medical History:  Diagnosis Date   Brain injury (HCC)    Cirrhosis with alcoholism (HCC)    Depression    Insomnia    Panic attack     Past Surgical History   Past Surgical History:  Procedure Laterality Date   ABDOMINAL SURGERY     CLOSED REDUCTION FINGER WITH PERCUTANEOUS PINNING Left 02/04/2024   Procedure: LEFT THUMB CLOSED REDUCTION FINGER WITH PERCUTANEOUS PINNing;  Surgeon: Samuella Cota, MD;  Location: [facility];  Service: Orthopedics;  Laterality: Left;   DISTAL FEMUR BONE BRIDGE EXCISION Left    FEMUR CLOSED REDUCTION Left 12/17/2021   Procedure: CLOSED REDUCTION FEMORAL SHAFT;  Surgeon: [name], MD;  Location: [facility];  Service: Orthopedics;  Laterality: Left;   FOREARM SURGERY Left    x4-metal rods placed   I & D EXTREMITY Left 02/04/2024   Procedure: IRRIGATION AND DEBRIDEMENT OF LEFT THUMB;  Surgeon: Samuella Cota, MD;  Location: [facility];  Service: Orthopedics;  Laterality: Left;   IR PARACENTESIS  03/02/2024   LEG SURGERY Right 2023   NAILBED REPAIR Left 02/04/2024   Procedure: LEFT THUMB NAILBED REPAIR;  Surgeon: Samuella Cota, MD;  Location: [facility];  Service: Orthopedics;  Laterality: Left;   ORIF FEMUR FRACTURE Left 12/20/2021   Procedure: OPEN REDUCTION INTERNAL FIXATION (ORIF) DISTAL FEMUR FRACTURE;  Surgeon: Roby Lofts, MD;  Location: [facility];  Service: Orthopedics;  Laterality: Left;   TIBIA IM NAIL INSERTION Bilateral 12/17/2021   Procedure: RIGHT INTRAMEDULLARY (IM) NAIL TIBIAL; RIGHT CLOSED TREATMENT OF FIBULA FRACTURE; LEFT CLOSED REDUCTION SPLINTING OF PERIPROSTHETIC  SUPRACONDYLAR FEMUR FRACTURE;  Surgeon: [name], MD;  Location: [facility];  Service: Orthopedics;  Laterality: Bilateral;   WRIST ARTHROSCOPY WITH  CARPOMETACARPEL [facility]) ARTHROPLASTY Left 03/21/2022   Procedure: left wrist arthroscopy with debridement as needed;  Surgeon: Dairl Ponder, MD;  Location: [facility];  Service: Orthopedics;  Laterality: Left;  just needs 60 mins for surgery   WRIST SURGERY Right    pins    Past Family History   No family history on file.  Past Social History   Social History   Socioeconomic History   Marital status: Single    Spouse name: Not on file   Number of children: Not on file   Years of education: Not on file   Highest education level: Not on file  Occupational History   Not on file  Tobacco Use   Smoking status: Every Day    Current packs/day: 0.50    Average packs/day: 0.5 packs/day for 14.0 years (7.0 ttl pk-yrs)    Types: Cigarettes   Smokeless tobacco: Never  Vaping Use   Vaping status: Former  Substance and Sexual Activity   Alcohol  use: Yes    Alcohol/week: 28.0 standard drinks of alcohol    Types: 28 Cans of beer per week    Comment: admits to drinking today 4-5   Drug use: Not Currently    Comment: "clean for 5 years"   Sexual activity: Yes    Birth control/protection: Condom  Other Topics Concern   Not on file  Social History Narrative   Not on file   Social Drivers of Health   Financial Resource Strain: Not on file  Food Insecurity: No Food Insecurity (03/10/2024)   Hunger Vital Sign    Worried About Running Out of Food in the Last Year: Never true    Ran Out of Food in the Last Year: Never true  Transportation Needs: No Transportation Needs (03/10/2024)   PRAPARE - Administrator, Civil Service (Medical): No    Lack of Transportation (Non-Medical): No  Physical Activity: Not on file  Stress: Not on file  Social Connections: Not on file  Intimate Partner Violence: Not At Risk (03/10/2024)   Humiliation, Afraid, Rape, and Kick questionnaire    Fear of Current or Ex-Partner: No    Emotionally Abused: No    Physically Abused: No     Sexually Abused: No    Review of Systems   General: Negative for anorexia, weight loss, fever, chills, fatigue, weakness. ENT: Negative for hoarseness, difficulty swallowing , nasal congestion. CV: Negative for chest pain, angina, palpitations, dyspnea on exertion, peripheral edema.  Respiratory: Negative for dyspnea at rest, dyspnea on exertion, cough, sputum, wheezing.  GI: See history of present illness. GU:  Negative for dysuria, hematuria, urinary incontinence, urinary frequency, nocturnal urination.  Endo: Negative for unusual weight change.     Physical Exam   There were no vitals taken for this visit.   General: Well-nourished, well-developed in no acute distress.  Eyes: No icterus. Mouth: Oropharyngeal mucosa moist and pink , no lesions erythema or exudate. Lungs: Clear to auscultation bilaterally.  Heart: Regular rate and rhythm, no murmurs rubs or gallops.  Abdomen: Bowel sounds are normal, nontender, nondistended, no hepatosplenomegaly or masses,  no abdominal bruits or hernia , no rebound or guarding.  Rectal: ***  Extremities: No lower extremity edema. No clubbing or deformities. Neuro: Alert and oriented x 4   Skin: Warm and dry, no jaundice.   Psych: Alert and cooperative, normal mood and affect.  Labs   Lab Results  Component Value Date   INR 1.4 (H) 03/20/2024   INR 1.7 (H) 03/15/2024   INR 2.0 (H) 03/14/2024   Lab Results  Component Value Date   NA 139 03/20/2024   CL 102 03/20/2024   K 3.2 (L) 03/20/2024   CO2 28 03/20/2024   BUN 9 03/20/2024   CREATININE 1.03 03/20/2024   GFRNONAA >60 03/20/2024   CALCIUM 8.6 (L) 03/20/2024   PHOS 3.3 09/10/2023   ALBUMIN 2.4 (L) 03/20/2024   GLUCOSE 103 (H) 03/20/2024   Lab Results  Component Value Date   WBC 6.0 03/20/2024   HGB 10.0 (L) 03/20/2024   HCT 28.9 (L) 03/20/2024   MCV 109.5 (H) 03/20/2024   PLT 62 (L) 03/20/2024   Lab Results  Component Value Date   LIPASE 111 (H) 03/20/2024   Lab  Results  Component Value Date   INR 1.4 (H) 03/20/2024   INR 1.7 (H) 03/15/2024   INR 2.0 (H) 03/14/2024    Imaging Studies   US Paracentesis Result Date: 03/11/2024 INDICATION: Patient with history  of ETOH cirrhosis, recurrent ascites. Request for diagnostic and therapeutic paracentesis. EXAM: ULTRASOUND GUIDED DIAGNOSTIC AND THERAPEUTIC PARACENTESIS MEDICATIONS: 8 mL 1% lidocaine COMPLICATIONS: None immediate. PROCEDURE: Informed written consent was obtained from the patient after a discussion of the risks, benefits and alternatives to treatment. A timeout was performed prior to the initiation of the procedure. Initial ultrasound scanning demonstrates a large amount of ascites within the right lower abdominal quadrant. The right lower abdomen was prepped and draped in the usual sterile fashion. 1% lidocaine was used for local anesthesia. Following this, a 19 gauge, 7-cm, Yueh catheter was introduced. An ultrasound image was saved for documentation purposes. The paracentesis was performed. The catheter was removed and a dressing was applied. The patient tolerated the procedure well without immediate post procedural complication. Patient received post-procedure intravenous albumin; see nursing notes for details. FINDINGS: A total of approximately 4.1 L of clear yellow fluid was removed. Samples were sent to the laboratory as requested by the clinical team. IMPRESSION: Successful ultrasound-guided paracentesis yielding 4.1 liters of peritoneal fluid. Performed by Lynnette Caffey, PA-C PLAN: If the patient eventually requires >/=2 paracenteses in a 30 day period, candidacy for formal evaluation by the [facility] will be assessed. Electronically Signed   By: Richarda Overlie M.D.   On: 03/11/2024 12:58   CT ABDOMEN PELVIS W CONTRAST Result Date: 03/10/2024 CLINICAL DATA:  Acute, nonlocalized abdominal pain EXAM: CT ABDOMEN AND PELVIS WITH CONTRAST TECHNIQUE:  Multidetector CT imaging of the abdomen and pelvis was performed using the standard protocol following bolus administration of intravenous contrast. RADIATION DOSE REDUCTION: This exam was performed according to the departmental dose-optimization program which includes automated exposure control, adjustment of the mA and/or kV according to patient size and/or use of iterative reconstruction technique. CONTRAST:  OMNIPAQUE IOHEXOL 300 MG/ML  SOLN COMPARISON:  Abdominal MRI 03/04/2024 FINDINGS: Lower chest: Small pleural effusions with lower lobe atelectasis. Premature atheromatous calcification of the coronaries. Lower periesophageal varices. Hepatobiliary: Cirrhosis and steatosis. Heterogeneous density of the liver recently evaluated by MRI.Cholelithiasis with very distended gallbladder but no focal wall thickening is seen. Multiple peripheral calcifications favoring adherent stone over mural calcification given eccentric positioning relative to the wall. No bile duct dilatation or detected calculus. Pancreas: Unremarkable. Spleen: Unremarkable. Adrenals/Urinary Tract: Negative adrenals. No hydronephrosis or stone. Unremarkable bladder. Stomach/Bowel: Submucosal low-density thickening of the proximal colon, likely portal colopathy. No bowel obstruction. Vascular/Lymphatic: Premature atheromatous calcification of the aorta and iliacs for age. No mass or adenopathy. Reproductive:No pathologic findings. Other: Large volume ascites which is low-density and seen throughout the abdomen. Musculoskeletal: No acute abnormalities. IMPRESSION: 1. No acute finding or change from 03/04/2024 MRI. 2. Cirrhosis with large volume ascites. 3. Cholelithiasis with very dilated gallbladder. 4. Small pleural effusions with atelectasis at the lung bases. Electronically Signed   By: Tiburcio Pea M.D.   On: 03/10/2024 06:54   DG Chest Port 1 View Result Date: 03/10/2024 CLINICAL DATA:  Shortness of breath and elevated heart rate  EXAM: PORTABLE CHEST 1 VIEW COMPARISON:  09/11/2023 FINDINGS: Low volume chest with band of opacity at the lung bases and small volume pleural fluid. Normal heart size. No pneumothorax. IMPRESSION: Low volume chest with small volume pleural fluid and streaky lower lobe atelectasis. Electronically Signed   By: Tiburcio Pea M.D.   On: 03/10/2024 05:37   MR LIVER W WO CONTRAST Result Date: 03/04/2024 CLINICAL DATA:  Unintended weight loss, cirrhosis EXAM: MRI ABDOMEN WITHOUT AND WITH CONTRAST TECHNIQUE: Multiplanar multisequence  MR imaging of the abdomen was performed both before and after the administration of intravenous contrast. CONTRAST:  5mL GADAVIST GADOBUTROL 1 MMOL/ML IV SOLN COMPARISON:  CT abdomen pelvis, 03/01/2024 FINDINGS: Lower chest: Trace pleural effusions. Hepatobiliary: Coarse, nodular cirrhotic morphology of the liver. Profound hepatic steatosis. Distended gallbladder containing tiny gallstones. Gallbladder wall thickening and pericholecystic fluid, nonspecific in the setting of ascites. No biliary ductal dilatation. Pancreas: Unremarkable. No pancreatic ductal dilatation or surrounding inflammatory changes. Spleen: Normal in size without significant abnormality. Adrenals/Urinary Tract: Adrenal glands are unremarkable. Kidneys are normal, without renal calculi, solid lesion, or hydronephrosis. Stomach/Bowel: Stomach is within normal limits. Thickening of the cecum and ascending colon, likely reflecting portal colopathy. Vascular/Lymphatic: Aortic atherosclerosis. Small gastroesophageal varices. No enlarged abdominal lymph nodes. Other: No abdominal wall hernia or abnormality. Moderate volume ascites throughout the abdomen. Musculoskeletal: No acute or significant osseous findings. IMPRESSION: 1. Cirrhosis and profound hepatic steatosis. No suspicious liver lesion. 2. Moderate volume ascites throughout the abdomen. 3. Small gastroesophageal varices. 4. Thickening of the cecum and ascending  colon, likely reflecting portal colopathy. 5. Distended gallbladder containing tiny gallstones. Gallbladder wall thickening and pericholecystic fluid, nonspecific in the setting of ascites. 6. Trace pleural effusions. Electronically Signed   By: Jearld Lesch M.D.   On: 03/04/2024 09:30   IR Paracentesis Result Date: 03/02/2024 INDICATION: 38 year old male with history of alcoholic cirrhosis presents with abdominal distention, ascites. Request made for diagnostic paracentesis. EXAM: ULTRASOUND GUIDED DIAGNOSTIC PARACENTESIS MEDICATIONS: 10 mL 1% lidocaine COMPLICATIONS: None immediate. PROCEDURE: Informed written consent was obtained from the patient after a discussion of the risks, benefits and alternatives to treatment. A timeout was performed prior to the initiation of the procedure. Initial ultrasound scanning demonstrates a small amount of ascites within the right lower abdominal quadrant. The right lower abdomen was prepped and draped in the usual sterile fashion. 1% lidocaine was used for local anesthesia. Following this, a 19 gauge, 7-cm, Yueh catheter was introduced. An ultrasound image was saved for documentation purposes. The paracentesis was performed. The catheter was removed and a dressing was applied. The patient tolerated the procedure well without immediate post procedural complication. FINDINGS: A total of approximately 1.0 liters of yellow fluid was removed. Samples were sent to the laboratory as requested by the clinical team. IMPRESSION: Successful ultrasound-guided paracentesis yielding 1.0 liters of peritoneal fluid. Electronically Signed   By: Corlis Leak M.D.   On: 03/02/2024 16:01   CT ABDOMEN PELVIS W CONTRAST Result Date: 03/01/2024 CLINICAL DATA:  Abdominal pain EXAM: CT ABDOMEN AND PELVIS WITH CONTRAST TECHNIQUE: Multidetector CT imaging of the abdomen and pelvis was performed using the standard protocol following bolus administration of intravenous contrast. RADIATION DOSE  REDUCTION: This exam was performed according to the departmental dose-optimization program which includes automated exposure control, adjustment of the mA and/or kV according to patient size and/or use of iterative reconstruction technique. CONTRAST:  75mL OMNIPAQUE IOHEXOL 350 MG/ML SOLN COMPARISON:  09/09/2023 FINDINGS: Lower chest: No acute findings. Hepatobiliary: Severe diffuse low-density throughout the liver compatible with fatty infiltration. Nodular contours of the liver compatible with cirrhosis. Multiple layering gallstones within the gallbladder. No biliary ductal dilatation. Pancreas: No focal abnormality or ductal dilatation. Spleen: No focal abnormality.  Normal size. Adrenals/Urinary Tract: No adrenal abnormality. No focal renal abnormality. No stones or hydronephrosis. Urinary bladder is unremarkable. Stomach/Bowel: Wall thickening within the right colon felt to reflect portal colopathy. Stomach and small bowel decompressed. No bowel obstruction. Vascular/Lymphatic: Aortic atherosclerosis. No evidence of aneurysm or  adenopathy. Distal esophageal varices noted compatible with portal venous hypertension. Reproductive: No visible focal abnormality. Other: Moderate to large volume ascites in the abdomen or pelvis. Musculoskeletal: No acute bony abnormality. IMPRESSION: Severe hepatic steatosis with cirrhosis. Associated distal esophageal varices and moderate to large volume ascites. Cholelithiasis.  No CT evidence of acute cholecystitis. Electronically Signed   By: Charlett Nose M.D.   On: 03/01/2024 19:10   XR Finger Thumb Left Result Date: 02/27/2024 X-rays of the left thumb demonstrate distal phalanx fracture with appropriate placement of the pin across the fracture site, and extend across the IP joint with appropriate positioning within the proximal phalanx.   Assessment      EGD  Platelets***   Leanna Battles. Melvyn Neth, MHS, PA-C [facility]

## 2024-03-23 ENCOUNTER — Encounter: Payer: Self-pay | Admitting: Gastroenterology

## 2024-03-23 ENCOUNTER — Ambulatory Visit (INDEPENDENT_AMBULATORY_CARE_PROVIDER_SITE_OTHER): Payer: MEDICAID | Admitting: Gastroenterology

## 2024-03-23 VITALS — BP 130/88 | HR 118 | Temp 97.8°F | Ht 61.0 in | Wt 107.6 lb

## 2024-03-23 DIAGNOSIS — K7031 Alcoholic cirrhosis of liver with ascites: Secondary | ICD-10-CM

## 2024-03-23 DIAGNOSIS — K7460 Unspecified cirrhosis of liver: Secondary | ICD-10-CM | POA: Diagnosis not present

## 2024-03-23 DIAGNOSIS — F1090 Alcohol use, unspecified, uncomplicated: Secondary | ICD-10-CM | POA: Diagnosis not present

## 2024-03-23 DIAGNOSIS — K7290 Hepatic failure, unspecified without coma: Secondary | ICD-10-CM

## 2024-03-23 DIAGNOSIS — K7010 Alcoholic hepatitis without ascites: Secondary | ICD-10-CM

## 2024-03-23 NOTE — Patient Instructions (Signed)
 Please reconsider inpatient alcohol rehab! You will need to decide to stop all alcohol if you want to use chlordiazepoxide to help with withdrawal symptoms.  Continue folic acid once daily. Continue furosemide and spironolactone once daily for fluid retention. Continue MVI daily. Continue pantoprazole daily before breakfast. Continue Vitamin B12 daily. Continue lactulose, adjust the dose up or down to maintain 2-3 soft stools daily. If you have worsening tremors, confusion, abdominal pain, not able to eat/drink you need to go to the nearest ER. Once your labs return, we will consider upper endoscopy as next step to evaluate your abdominal pain.

## 2024-03-25 ENCOUNTER — Other Ambulatory Visit: Payer: Self-pay

## 2024-03-25 ENCOUNTER — Emergency Department (HOSPITAL_COMMUNITY)
Admission: EM | Admit: 2024-03-25 | Discharge: 2024-03-25 | Disposition: A | Discharge status: Home / Self Care | Payer: MEDICAID | Attending: Emergency Medicine | Admitting: Emergency Medicine

## 2024-03-25 DIAGNOSIS — Z139 Encounter for screening, unspecified: Secondary | ICD-10-CM

## 2024-03-25 DIAGNOSIS — Z91040 Latex allergy status: Secondary | ICD-10-CM | POA: Insufficient documentation

## 2024-03-25 DIAGNOSIS — E876 Hypokalemia: Secondary | ICD-10-CM | POA: Insufficient documentation

## 2024-03-25 DIAGNOSIS — Z0289 Encounter for other administrative examinations: Secondary | ICD-10-CM | POA: Diagnosis present

## 2024-03-25 LAB — CBC WITH DIFFERENTIAL/PLATELET
Abs Immature Granulocytes: 0.01 10*3/uL (ref 0.00–0.07)
Basophils Absolute: 0.1 10*3/uL (ref 0.0–0.1)
Basophils Relative: 1 %
Eosinophils Absolute: 0.2 10*3/uL (ref 0.0–0.5)
Eosinophils Relative: 3 %
HCT: 30 % — ABNORMAL LOW (ref 39.0–52.0)
Hemoglobin: 10.2 g/dL — ABNORMAL LOW (ref 13.0–17.0)
Immature Granulocytes: 0 %
Lymphocytes Relative: 38 %
Lymphs Abs: 2 10*3/uL (ref 0.7–4.0)
MCH: 38.1 pg — ABNORMAL HIGH (ref 26.0–34.0)
MCHC: 34 g/dL (ref 30.0–36.0)
MCV: 111.9 fL — ABNORMAL HIGH (ref 80.0–100.0)
Monocytes Absolute: 0.4 10*3/uL (ref 0.1–1.0)
Monocytes Relative: 7 %
Neutro Abs: 2.6 10*3/uL (ref 1.7–7.7)
Neutrophils Relative %: 51 %
Platelets: 66 10*3/uL — ABNORMAL LOW (ref 150–400)
RBC: 2.68 MIL/uL — ABNORMAL LOW (ref 4.22–5.81)
RDW: 13.5 % (ref 11.5–15.5)
Smear Review: DECREASED
WBC: 5.2 10*3/uL (ref 4.0–10.5)
nRBC: 0 % (ref 0.0–0.2)

## 2024-03-25 LAB — COMPREHENSIVE METABOLIC PANEL WITH GFR
ALT: 32 U/L (ref 0–44)
AST: 91 U/L — ABNORMAL HIGH (ref 15–41)
Albumin: 2.5 g/dL — ABNORMAL LOW (ref 3.5–5.0)
Alkaline Phosphatase: 74 U/L (ref 38–126)
Anion gap: 12 (ref 5–15)
BUN: 10 mg/dL (ref 6–20)
CO2: 26 mmol/L (ref 22–32)
Calcium: 8.5 mg/dL — ABNORMAL LOW (ref 8.9–10.3)
Chloride: 102 mmol/L (ref 98–111)
Creatinine, Ser: 0.75 mg/dL (ref 0.61–1.24)
GFR, Estimated: >60 mL/min (ref >60)
Glucose, Bld: 102 mg/dL — ABNORMAL HIGH (ref 70–99)
Potassium: 3.2 mmol/L — ABNORMAL LOW (ref 3.5–5.1)
Sodium: 140 mmol/L (ref 135–145)
Total Bilirubin: 2.6 mg/dL — ABNORMAL HIGH (ref 0.0–1.2)
Total Protein: 6.8 g/dL (ref 6.5–8.1)

## 2024-03-25 MED ORDER — POTASSIUM CHLORIDE CRYS ER 20 MEQ PO TBCR
40.0000 meq | EXTENDED_RELEASE_TABLET | Freq: Once | ORAL | Status: AC
  Administered 2024-03-25: 40 meq via ORAL
  Filled 2024-03-25: qty 2

## 2024-03-25 NOTE — ED Notes (Signed)
 Pt ambulated to ED bed with staff. Pt is A&Ox4, calm, and cooperative at this time. Pt stated, "Im just here for my father will be happy. I have my own detox plan.

## 2024-03-25 NOTE — ED Provider Notes (Signed)
 [name] EMERGENCY DEPARTMENT AT [facility] Provider Note   CSN: [id number] Arrival date & time: 03/25/24  1344     History  Chief Complaint  Patient presents with   Medical Clearance    Gregory Murphy is a 38 y.o. male.  Patient is a 38 year old male who presents to the emergency department requesting medical clearance for detox at this time.  He notes that his father did bring him to the emergency department for detox but patient adamantly states that he does not want to detox at this time that he has an appointment in 3 days to do so.  He has no other active complaints at this time.  He denies any chest pain, shortness of breath, abdominal pain, nausea, vomiting, diarrhea.  He denies any increased confusion.  He denies any swelling to his abdomen.        Home Medications Prior to Admission medications   Medication Sig Start Date End Date Taking? Authorizing Provider  chlordiazePOXIDE (LIBRIUM) 5 MG capsule Take 1 capsule (5 mg total) by mouth 4 (four) times daily for 2 days, THEN 1 capsule (5 mg total) 3 (three) times daily for 3 days, THEN 1 capsule (5 mg total) 2 (two) times daily for 3 days, THEN 1 capsule (5 mg total) daily for 3 days. DO NOT TAKE THIS WITH ALCOHOL OR ANY RECREATIONAL SUBSTANCES.. 03/15/24 03/26/24  Johnson, Clanford L, MD  folic acid (FOLVITE) 1 MG tablet Take 1 tablet (1 mg total) by mouth daily. 03/16/24   Johnson, Clanford L, MD  furosemide (LASIX) 40 MG tablet Take 1 tablet (40 mg total) by mouth daily. 03/16/24   Johnson, Clanford L, MD  hydrOXYzine (ATARAX) 25 MG tablet Take 1-2 tablets (25-50 mg total) by mouth 3 (three) times daily as needed for itching. 03/15/24   Johnson, Clanford L, MD  lactulose (CHRONULAC) 10 GM/15ML solution Take 15 mLs (10 g total) by mouth 2 (two) times daily. WITH GOAL TO HAVE 3 BOWEL MOVEMENTS DAILY. 03/15/24   Cleora Fleet, MD  Multiple Vitamin (MULTIVITAMIN WITH MINERALS) TABS tablet Take 1 tablet by  mouth daily. 03/16/24   Johnson, Clanford L, MD  pantoprazole (PROTONIX) 40 MG tablet Take 1 tablet (40 mg total) by mouth daily. 03/16/24   Johnson, Clanford L, MD  spironolactone (ALDACTONE) 100 MG tablet Take 1 tablet (100 mg total) by mouth daily. 03/16/24   Johnson, Clanford L, MD  thiamine (VITAMIN B-1) 100 MG tablet Take 1 tablet (100 mg total) by mouth daily. 03/16/24   Johnson, Clanford L, MD  vitamin B-12 (CYANOCOBALAMIN) 500 MCG tablet Take 500 mcg by mouth daily.    [provider]      Allergies    Latex    Review of Systems   Review of Systems  Constitutional:  Negative for fatigue.  Respiratory:  Negative for shortness of breath.   Gastrointestinal:  Negative for abdominal pain.  All other systems reviewed and are negative.   Physical Exam Updated Vital Signs BP (!) 141/87 (BP Location: Right Arm)   Pulse 89   Temp 98.6 F (37 C) (Oral)   Resp 16   Ht 5\' 1"  (1.549 m)   Wt 49.9 kg   SpO2 98%   BMI 20.77 kg/m  Physical Exam Vitals and nursing note reviewed.  Constitutional:      Appearance: Normal appearance.  HENT:     Head: Normocephalic and atraumatic.     Nose: Nose normal.  Mouth/Throat:     Mouth: Mucous membranes are moist.  Eyes:     Extraocular Movements: Extraocular movements intact.     Conjunctiva/sclera: Conjunctivae normal.     Pupils: Pupils are equal, round, and reactive to light.  Cardiovascular:     Rate and Rhythm: Normal rate and regular rhythm.     Pulses: Normal pulses.     Heart sounds: Normal heart sounds.  Pulmonary:     Effort: Pulmonary effort is normal. No respiratory distress.     Breath sounds: Normal breath sounds.  Abdominal:     General: Abdomen is flat. Bowel sounds are normal. There is no distension.     Palpations: Abdomen is soft.     Tenderness: There is no abdominal tenderness. There is no guarding.  Musculoskeletal:        General: Normal range of motion.     Cervical back: Normal range of motion and  neck supple.  Skin:    General: Skin is warm and dry.     Findings: No rash.  Neurological:     General: No focal deficit present.     Mental Status: He is alert and oriented to person, place, and time. Mental status is at baseline.     Cranial Nerves: No cranial nerve deficit.     Sensory: No sensory deficit.     Motor: No weakness.     Coordination: Coordination normal.     Gait: Gait normal.  Psychiatric:        Mood and Affect: Mood normal.        Behavior: Behavior normal.        Thought Content: Thought content normal.        Judgment: Judgment normal.     ED Results / Procedures / Treatments   Labs (all labs ordered are listed, but only abnormal results are displayed) Labs Reviewed  COMPREHENSIVE METABOLIC PANEL WITH GFR  CBC WITH DIFFERENTIAL/PLATELET    EKG None  Radiology No results found.  Procedures Procedures    Medications Ordered in ED Medications - No data to display  ED Course/ Medical Decision Making/ A&P                                 Medical Decision Making Amount and/or Complexity of Data Reviewed Labs: ordered.  Risk Prescription drug management.   This patient presents to the ED for concern of medical clearance    Additional history obtained:  Additional history obtained from none External records from outside source obtained and reviewed including none   Lab Tests:  I Ordered, and personally interpreted labs.  The pertinent results include: Mild hypokalemia, liver function stable, normal creatinine, mild anemia but at baseline, no leukocytosis   Medicines ordered and prescription drug management:  I ordered medication including potassium for low kalemia Reevaluation of the patient after these medicines showed that the patient improved I have reviewed the patients home medicines and have made adjustments as needed   Problem List / ED Course:  Patient is doing well at this time.  Patient came into the emergency  department on requesting medical clearance for detox.  He does not wish for detox at this time and is adamant about this.  He states he would like to be discharged home at this time.  Patient has no concerning findings on blood work or physical exam at this time.  Vital signs are stable.  He  is mentating at his baseline.  Patient was directed to report to detox as scheduled.   Social Determinants of Health:  None           Final Clinical Impression(s) / ED Diagnoses Final diagnoses:  None    Rx / DC Orders ED Discharge Orders     None         Kathlen Mody 03/25/24 1537    Eber Hong, MD 03/26/24 0930

## 2024-03-25 NOTE — ED Triage Notes (Signed)
 Pt is requesting medical clearance for detox .

## 2024-03-25 NOTE — Discharge Instructions (Signed)
 Please report to detox as scheduled.  Your workup today has been unremarkable.  Return to emergency department immediately for any new or worsening symptoms.

## 2024-03-30 ENCOUNTER — Encounter: Payer: MEDICAID | Admitting: Orthopedic Surgery

## 2024-04-08 NOTE — Progress Notes (Unsigned)
 Chief Complaint:follow-up hospital Primary GI Doctor:Dr. McGreal  HPI: 38 y.o. year old male with a history of alcohol  abuse, traumatic brain injury, cholelithiasis , depression, panic attacks.  Patient admitted with nausea, vomiting, abdominal pain.     Interval History  Patient admits/denies GERD Patient admits/denies dysphagia Patient admits/denies nausea, vomiting, or weight loss  Patient admits/denies altered bowel habits Patient admits/denies abdominal pain Patient admits/denies rectal bleeding   Denies/Admits alcohol  Denies/Admits smoking Denies/Admits NSAID use. Denies/Admits they are on blood thinners.  Patients last colonoscopy Patients last EGD  Patient's family history includes  Wt Readings from Last 3 Encounters:  03/25/24 109 lb 14.4 oz (49.9 kg)  03/23/24 107 lb 9.6 oz (48.8 kg)  03/20/24 115 lb 15.4 oz (52.6 kg)      Past Medical History:  Diagnosis Date   Brain injury (HCC)    Cirrhosis with alcoholism (HCC)    Depression    Insomnia    Panic attack     Past Surgical History:  Procedure Laterality Date   ABDOMINAL SURGERY     CLOSED REDUCTION FINGER WITH PERCUTANEOUS PINNING Left 02/04/2024   Procedure: LEFT THUMB CLOSED REDUCTION FINGER WITH PERCUTANEOUS PINNing;  Surgeon: Merrill Abide, MD;  Location: [facility];  Service: Orthopedics;  Laterality: Left;   DISTAL FEMUR BONE BRIDGE EXCISION Left    FEMUR CLOSED REDUCTION Left 12/17/2021   Procedure: CLOSED REDUCTION FEMORAL SHAFT;  Surgeon: Ali Ink, MD;  Location: [facility];  Service: Orthopedics;  Laterality: Left;   FOREARM SURGERY Left    x4-metal rods placed   I & D EXTREMITY Left 02/04/2024   Procedure: IRRIGATION AND DEBRIDEMENT OF LEFT THUMB;  Surgeon: Merrill Abide, MD;  Location: [facility];  Service: Orthopedics;  Laterality: Left;   IR PARACENTESIS  03/02/2024   LEG SURGERY Right 2023   NAILBED REPAIR Left 02/04/2024   Procedure: LEFT  THUMB NAILBED REPAIR;  Surgeon: Merrill Abide, MD;  Location: [facility];  Service: Orthopedics;  Laterality: Left;   ORIF FEMUR FRACTURE Left 12/20/2021   Procedure: OPEN REDUCTION INTERNAL FIXATION (ORIF) DISTAL FEMUR FRACTURE;  Surgeon: Laneta Pintos, MD;  Location: [facility];  Service: Orthopedics;  Laterality: Left;   TIBIA IM NAIL INSERTION Bilateral 12/17/2021   Procedure: RIGHT INTRAMEDULLARY (IM) NAIL TIBIAL; RIGHT CLOSED TREATMENT OF FIBULA FRACTURE; LEFT CLOSED REDUCTION SPLINTING OF PERIPROSTHETIC  SUPRACONDYLAR FEMUR FRACTURE;  Surgeon: Ali Ink, MD;  Location: [facility];  Service: Orthopedics;  Laterality: Bilateral;   WRIST ARTHROSCOPY WITH CARPOMETACARPEL [facility]) ARTHROPLASTY Left 03/21/2022   Procedure: left wrist arthroscopy with debridement as needed;  Surgeon: Florida Hurter, MD;  Location: [facility];  Service: Orthopedics;  Laterality: Left;  just needs 60 mins for surgery   WRIST SURGERY Right    pins    Current Outpatient Medications  Medication Sig Dispense Refill   folic acid  (FOLVITE ) 1 MG tablet Take 1 tablet (1 mg total) by mouth daily. 30 tablet 2   furosemide  (LASIX ) 40 MG tablet Take 1 tablet (40 mg total) by mouth daily. 30 tablet 1   hydrOXYzine  (ATARAX ) 25 MG tablet Take 1-2 tablets (25-50 mg total) by mouth 3 (three) times daily as needed for itching. 30 tablet 0   lactulose  (CHRONULAC ) 10 GM/15ML solution Take 15 mLs (10 g total) by mouth 2 (two) times daily. WITH GOAL TO HAVE 3 BOWEL MOVEMENTS DAILY. 946 mL 0   Multiple Vitamin (MULTIVITAMIN WITH MINERALS) TABS tablet Take 1 tablet by mouth  daily.     pantoprazole  (PROTONIX ) 40 MG tablet Take 1 tablet (40 mg total) by mouth daily. 30 tablet 1   spironolactone  (ALDACTONE ) 100 MG tablet Take 1 tablet (100 mg total) by mouth daily. 30 tablet 1   thiamine  (VITAMIN B-1) 100 MG tablet Take 1 tablet (100 mg total) by mouth daily. 30 tablet 1   vitamin B-12 (CYANOCOBALAMIN ) 500  MCG tablet Take 500 mcg by mouth daily.     No current facility-administered medications for this visit.    Allergies as of 04/09/2024 - Review Complete 03/25/2024  Allergen Reaction Noted   Latex Itching 03/10/2024    No family history on file.  Review of Systems:    Constitutional: No weight loss, fever, chills, weakness or fatigue HEENT: Eyes: No change in vision               Ears, Nose, Throat:  No change in hearing or congestion Skin: No rash or itching Cardiovascular: No chest pain, chest pressure or palpitations   Respiratory: No SOB or cough Gastrointestinal: See HPI and otherwise negative Genitourinary: No dysuria or change in urinary frequency Neurological: No headache, dizziness or syncope Musculoskeletal: No new muscle or joint pain Hematologic: No bleeding or bruising Psychiatric: No history of depression or anxiety    Physical Exam:  Vital signs: There were no vitals taken for this visit.  Constitutional:   Pleasant *** male appears to be in NAD, Well developed, Well nourished, alert and cooperative Head:  Normocephalic and atraumatic. Eyes:   PEERL, EOMI. No icterus. Conjunctiva pink. Ears:  Normal auditory acuity. Neck:  Supple Throat: Oral cavity and pharynx without inflammation, swelling or lesion.  Respiratory: Respirations even and unlabored. Lungs clear to auscultation bilaterally.   No wheezes, crackles, or rhonchi.  Cardiovascular: Normal S1, S2. Regular rate and rhythm. No peripheral edema, cyanosis or pallor.  Gastrointestinal:  Soft, nondistended, nontender. No rebound or guarding. Normal bowel sounds. No appreciable masses or hepatomegaly. Rectal:  Not performed.  Anoscopy: Msk:  Symmetrical without gross deformities. Without edema, no deformity or joint abnormality.  Neurologic:  Alert and  oriented x4;  grossly normal neurologically.  Skin:   Dry and intact without significant lesions or rashes. Psychiatric: Oriented to person, place and  time. Demonstrates good judgement and reason without abnormal affect or behaviors.  RELEVANT LABS AND IMAGING: CBC    Latest Ref Rng & Units 03/25/2024    2:43 PM 03/20/2024    5:30 PM 03/15/2024    5:18 AM  CBC  WBC 4.0 - 10.5 K/uL 5.2  6.0  5.3   Hemoglobin 13.0 - 17.0 g/dL 16.1  09.6  8.8   Hematocrit 39.0 - 52.0 % 30.0  28.9  25.7   Platelets 150 - 400 K/uL 66  62  35      CMP     Latest Ref Rng & Units 03/25/2024    2:43 PM 03/20/2024    5:30 PM 03/15/2024    5:18 AM  CMP  Glucose 70 - 99 mg/dL 045  409  91   BUN 6 - 20 mg/dL 10  9  9    Creatinine 0.61 - 1.24 mg/dL 8.11  9.14  7.82   Sodium 135 - 145 mmol/L 140  139  132   Potassium 3.5 - 5.1 mmol/L 3.2  3.2  3.7   Chloride 98 - 111 mmol/L 102  102  100   CO2 22 - 32 mmol/L 26  28  27  Calcium  8.9 - 10.3 mg/dL 8.5  8.6  8.2   Total Protein 6.5 - 8.1 g/dL 6.8  6.6  5.6   Total Bilirubin 0.0 - 1.2 mg/dL 2.6  3.6  4.0   Alkaline Phos 38 - 126 U/L 74  65  62   AST 15 - 41 U/L 91  97  85   ALT 0 - 44 U/L 32  37  33      No results found for: "TSH"   Assessment: 1. ***  Plan: 1. ***   Thank you for the courtesy of this consult. Please call me with any questions or concerns.   Cloyce Blankenhorn, FNP-C [facility] 04/08/2024, 4:55 PM  Cc: No ref. provider found

## 2024-04-09 ENCOUNTER — Ambulatory Visit: Payer: MEDICAID | Admitting: Gastroenterology

## 2024-04-13 ENCOUNTER — Telehealth (INDEPENDENT_AMBULATORY_CARE_PROVIDER_SITE_OTHER): Payer: Self-pay | Admitting: Gastroenterology

## 2024-04-13 NOTE — Telephone Encounter (Signed)
 [facility] that patient needed a follow up. LSL saw him 03/23/2024. I didn't see any follow up recommendations in the office note. Please advise when patient needs to come back. [phone]

## 2024-04-13 NOTE — Telephone Encounter (Signed)
 Gregory [facility] (from the [facility]) needs the OV note on patient that was seen on 03/23/2024 by LSL fax to [phone] Attn: Gregory Murphy

## 2024-04-13 NOTE — Telephone Encounter (Signed)
 Pt never went to have labs done at [facility]. We are waiting for lab results to be able to move forward with recommendations and follow up timing.

## 2024-07-12 ENCOUNTER — Telehealth: Payer: Self-pay | Admitting: Orthopedic Surgery

## 2024-07-12 NOTE — Telephone Encounter (Signed)
 Patient was contacted for follow-up regarding left thumb irrigation and debridement and pinning of distal phalanx fracture from February of this year.  He was seen for his 3-week postoperative visit in March, has been unable to return since.  States that he is currently doing well, was offered a new follow-up visit however he declined at this time.  Will return as needed.  Haron Beilke, MD Hand Surgery, [place]

## 2024-07-14 ENCOUNTER — Encounter (HOSPITAL_COMMUNITY): Payer: Self-pay

## 2024-07-14 ENCOUNTER — Other Ambulatory Visit: Payer: Self-pay

## 2024-07-14 ENCOUNTER — Inpatient Hospital Stay (HOSPITAL_COMMUNITY)
Admission: EM | Admit: 2024-07-14 | Discharge: 2024-07-16 | DRG: 433 | Disposition: A | Discharge status: Home / Self Care | Payer: MEDICAID | Attending: Family Medicine | Admitting: Family Medicine

## 2024-07-14 ENCOUNTER — Emergency Department (HOSPITAL_COMMUNITY): Payer: MEDICAID

## 2024-07-14 DIAGNOSIS — M25062 Hemarthrosis, left knee: Principal | ICD-10-CM | POA: Diagnosis present

## 2024-07-14 DIAGNOSIS — D6959 Other secondary thrombocytopenia: Secondary | ICD-10-CM | POA: Diagnosis present

## 2024-07-14 DIAGNOSIS — M25461 Effusion, right knee: Secondary | ICD-10-CM

## 2024-07-14 DIAGNOSIS — Z91040 Latex allergy status: Secondary | ICD-10-CM

## 2024-07-14 DIAGNOSIS — Z79899 Other long term (current) drug therapy: Secondary | ICD-10-CM

## 2024-07-14 DIAGNOSIS — K709 Alcoholic liver disease, unspecified: Secondary | ICD-10-CM

## 2024-07-14 DIAGNOSIS — F1010 Alcohol abuse, uncomplicated: Secondary | ICD-10-CM | POA: Diagnosis present

## 2024-07-14 DIAGNOSIS — M25562 Pain in left knee: Secondary | ICD-10-CM | POA: Diagnosis present

## 2024-07-14 DIAGNOSIS — L0292 Furuncle, unspecified: Secondary | ICD-10-CM | POA: Diagnosis present

## 2024-07-14 DIAGNOSIS — R768 Other specified abnormal immunological findings in serum: Secondary | ICD-10-CM | POA: Diagnosis present

## 2024-07-14 DIAGNOSIS — Z87891 Personal history of nicotine dependence: Secondary | ICD-10-CM

## 2024-07-14 DIAGNOSIS — D696 Thrombocytopenia, unspecified: Secondary | ICD-10-CM | POA: Diagnosis present

## 2024-07-14 DIAGNOSIS — S62522A Displaced fracture of distal phalanx of left thumb, initial encounter for closed fracture: Secondary | ICD-10-CM | POA: Diagnosis present

## 2024-07-14 DIAGNOSIS — K7011 Alcoholic hepatitis with ascites: Secondary | ICD-10-CM | POA: Diagnosis present

## 2024-07-14 DIAGNOSIS — D61818 Other pancytopenia: Secondary | ICD-10-CM | POA: Diagnosis present

## 2024-07-14 DIAGNOSIS — S62522B Displaced fracture of distal phalanx of left thumb, initial encounter for open fracture: Secondary | ICD-10-CM | POA: Diagnosis present

## 2024-07-14 DIAGNOSIS — D689 Coagulation defect, unspecified: Secondary | ICD-10-CM | POA: Diagnosis present

## 2024-07-14 DIAGNOSIS — K7031 Alcoholic cirrhosis of liver with ascites: Principal | ICD-10-CM | POA: Diagnosis present

## 2024-07-14 LAB — CBC
HCT: 29.5 % — ABNORMAL LOW (ref 39.0–52.0)
HCT: 29.5 % — ABNORMAL LOW (ref 39.0–52.0)
Hemoglobin: 10 g/dL — ABNORMAL LOW (ref 13.0–17.0)
Hemoglobin: 10.3 g/dL — ABNORMAL LOW (ref 13.0–17.0)
MCH: 35.2 pg — ABNORMAL HIGH (ref 26.0–34.0)
MCH: 36.4 pg — ABNORMAL HIGH (ref 26.0–34.0)
MCHC: 33.9 g/dL (ref 30.0–36.0)
MCHC: 34.9 g/dL (ref 30.0–36.0)
MCV: 103.9 fL — ABNORMAL HIGH (ref 80.0–100.0)
MCV: 104.2 fL — ABNORMAL HIGH (ref 80.0–100.0)
Platelets: 51 K/uL — ABNORMAL LOW (ref 150–400)
Platelets: 50 K/uL — ABNORMAL LOW (ref 150–400)
RBC: 2.84 MIL/uL — ABNORMAL LOW (ref 4.22–5.81)
RBC: 2.83 MIL/uL — ABNORMAL LOW (ref 4.22–5.81)
RDW: 14.7 % (ref 11.5–15.5)
RDW: 15 % (ref 11.5–15.5)
WBC: 3.8 K/uL — ABNORMAL LOW (ref 4.0–10.5)
WBC: 3.9 K/uL — ABNORMAL LOW (ref 4.0–10.5)
nRBC: 0 % (ref 0.0–0.2)
nRBC: 0 % (ref 0.0–0.2)

## 2024-07-14 LAB — TYPE AND SCREEN
ABO/RH(D): A POS
Antibody Screen: NEGATIVE

## 2024-07-14 LAB — APTT: aPTT: 37 s — ABNORMAL HIGH (ref 24–36)

## 2024-07-14 LAB — PHOSPHORUS: Phosphorus: 3.6 mg/dL (ref 2.5–4.6)

## 2024-07-14 LAB — BASIC METABOLIC PANEL WITH GFR
Anion gap: 11 (ref 5–15)
BUN: 6 mg/dL (ref 6–20)
CO2: 26 mmol/L (ref 22–32)
Calcium: 8.5 mg/dL — ABNORMAL LOW (ref 8.9–10.3)
Chloride: 100 mmol/L (ref 98–111)
Creatinine, Ser: 0.58 mg/dL — ABNORMAL LOW (ref 0.61–1.24)
GFR, Estimated: >60 mL/min (ref >60)
Glucose, Bld: 108 mg/dL — ABNORMAL HIGH (ref 70–99)
Potassium: 3.8 mmol/L (ref 3.5–5.1)
Sodium: 137 mmol/L (ref 135–145)

## 2024-07-14 LAB — CBC WITH DIFFERENTIAL/PLATELET
Abs Immature Granulocytes: 0.01 K/uL (ref 0.00–0.07)
Basophils Absolute: 0.1 K/uL (ref 0.0–0.1)
Basophils Relative: 1 %
Eosinophils Absolute: 0.1 K/uL (ref 0.0–0.5)
Eosinophils Relative: 2 %
HCT: 33.4 % — ABNORMAL LOW (ref 39.0–52.0)
Hemoglobin: 11.7 g/dL — ABNORMAL LOW (ref 13.0–17.0)
Immature Granulocytes: 0 %
Lymphocytes Relative: 30 %
Lymphs Abs: 1.2 K/uL (ref 0.7–4.0)
MCH: 36 pg — ABNORMAL HIGH (ref 26.0–34.0)
MCHC: 35 g/dL (ref 30.0–36.0)
MCV: 102.8 fL — ABNORMAL HIGH (ref 80.0–100.0)
Monocytes Absolute: 0.3 K/uL (ref 0.1–1.0)
Monocytes Relative: 8 %
Neutro Abs: 2.4 K/uL (ref 1.7–7.7)
Neutrophils Relative %: 59 %
Platelets: 32 K/uL — ABNORMAL LOW (ref 150–400)
RBC: 3.25 MIL/uL — ABNORMAL LOW (ref 4.22–5.81)
RDW: 14.8 % (ref 11.5–15.5)
WBC: 4 K/uL (ref 4.0–10.5)
nRBC: 0 % (ref 0.0–0.2)

## 2024-07-14 LAB — HEPATIC FUNCTION PANEL
ALT: 74 U/L — ABNORMAL HIGH (ref 0–44)
AST: 244 U/L — ABNORMAL HIGH (ref 15–41)
Albumin: 2.8 g/dL — ABNORMAL LOW (ref 3.5–5.0)
Alkaline Phosphatase: 130 U/L — ABNORMAL HIGH (ref 38–126)
Bilirubin, Direct: 2 mg/dL — ABNORMAL HIGH (ref 0.0–0.2)
Indirect Bilirubin: 2.3 mg/dL — ABNORMAL HIGH (ref 0.3–0.9)
Total Bilirubin: 4.3 mg/dL — ABNORMAL HIGH (ref 0.0–1.2)
Total Protein: 6.5 g/dL (ref 6.5–8.1)

## 2024-07-14 LAB — PROTIME-INR
INR: 1.7 — ABNORMAL HIGH (ref 0.8–1.2)
Prothrombin Time: 20.9 s — ABNORMAL HIGH (ref 11.4–15.2)

## 2024-07-14 LAB — SYNOVIAL CELL COUNT + DIFF, W/ CRYSTALS
Crystals, Fluid: NONE SEEN
Eosinophils-Synovial: 1 % (ref 0–1)
Lymphocytes-Synovial Fld: 36 % — ABNORMAL HIGH (ref 0–20)
Monocyte-Macrophage-Synovial Fluid: 6 % — ABNORMAL LOW (ref 50–90)
Neutrophil, Synovial: 57 % — ABNORMAL HIGH (ref 0–25)
WBC, Synovial: 397 /mm3 — ABNORMAL HIGH (ref 0–200)

## 2024-07-14 LAB — SEDIMENTATION RATE: Sed Rate: 4 mm/h (ref 0–15)

## 2024-07-14 LAB — D-DIMER, QUANTITATIVE: D-Dimer, Quant: 2.82 ug{FEU}/mL — ABNORMAL HIGH (ref 0.00–0.50)

## 2024-07-14 LAB — MAGNESIUM: Magnesium: 1.7 mg/dL (ref 1.7–2.4)

## 2024-07-14 LAB — FIBRINOGEN: Fibrinogen: 108 mg/dL — ABNORMAL LOW (ref 210–475)

## 2024-07-14 MED ORDER — FENTANYL CITRATE (PF) 100 MCG/2ML IJ SOLN
50.0000 ug | Freq: Once | INTRAMUSCULAR | Status: AC
  Administered 2024-07-14: 50 ug via INTRAVENOUS
  Filled 2024-07-14: qty 2

## 2024-07-14 MED ORDER — ACETAMINOPHEN 650 MG RE SUPP: 650.0000 mg | Freq: Four times a day (QID) | RECTAL | Status: DC | PRN

## 2024-07-14 MED ORDER — ONDANSETRON HCL 4 MG/2ML IJ SOLN: 4.0000 mg | Freq: Four times a day (QID) | INTRAMUSCULAR | Status: DC | PRN

## 2024-07-14 MED ORDER — ONDANSETRON HCL 4 MG PO TABS: 4.0000 mg | ORAL_TABLET | Freq: Four times a day (QID) | ORAL | Status: DC | PRN

## 2024-07-14 MED ORDER — THIAMINE HCL 100 MG/ML IJ SOLN
100.0000 mg | Freq: Every day | INTRAMUSCULAR | Status: DC
  Filled 2024-07-14: qty 2

## 2024-07-14 MED ORDER — FLEET ENEMA RE ENEM: 1.0000 | ENEMA | Freq: Once | RECTAL | Status: DC | PRN

## 2024-07-14 MED ORDER — LORAZEPAM 1 MG PO TABS
1.0000 mg | ORAL_TABLET | ORAL | Status: DC | PRN
  Administered 2024-07-15 – 2024-07-16 (×3): 1 mg via ORAL
  Filled 2024-07-14 (×3): qty 1

## 2024-07-14 MED ORDER — HYDRALAZINE HCL 20 MG/ML IJ SOLN: 10.0000 mg | INTRAMUSCULAR | Status: DC | PRN

## 2024-07-14 MED ORDER — PHYTONADIONE 5 MG PO TABS
5.0000 mg | ORAL_TABLET | Freq: Once | ORAL | Status: AC
  Administered 2024-07-14: 5 mg via ORAL
  Filled 2024-07-14: qty 1

## 2024-07-14 MED ORDER — ZOLPIDEM TARTRATE 5 MG PO TABS: 5.0000 mg | ORAL_TABLET | Freq: Every evening | ORAL | Status: DC | PRN

## 2024-07-14 MED ORDER — SODIUM CHLORIDE 0.9% IV SOLUTION: Freq: Once | INTRAVENOUS | Status: DC

## 2024-07-14 MED ORDER — FOLIC ACID 1 MG PO TABS
1.0000 mg | ORAL_TABLET | Freq: Every day | ORAL | Status: DC
  Administered 2024-07-14 – 2024-07-16 (×3): 1 mg via ORAL
  Filled 2024-07-14 (×3): qty 1

## 2024-07-14 MED ORDER — SENNOSIDES-DOCUSATE SODIUM 8.6-50 MG PO TABS: 1.0000 | ORAL_TABLET | Freq: Every evening | ORAL | Status: DC | PRN

## 2024-07-14 MED ORDER — LEVALBUTEROL HCL 0.63 MG/3ML IN NEBU: 0.6300 mg | INHALATION_SOLUTION | Freq: Four times a day (QID) | RESPIRATORY_TRACT | Status: DC | PRN

## 2024-07-14 MED ORDER — HEPARIN SODIUM (PORCINE) 5000 UNIT/ML IJ SOLN: 5000.0000 [IU] | Freq: Three times a day (TID) | INTRAMUSCULAR | Status: DC

## 2024-07-14 MED ORDER — THIAMINE MONONITRATE 100 MG PO TABS
100.0000 mg | ORAL_TABLET | Freq: Every day | ORAL | Status: DC
  Administered 2024-07-14 – 2024-07-16 (×3): 100 mg via ORAL
  Filled 2024-07-14 (×3): qty 1

## 2024-07-14 MED ORDER — SODIUM CHLORIDE 0.9 % IV SOLN: INTRAVENOUS | Status: AC

## 2024-07-14 MED ORDER — BISACODYL 5 MG PO TBEC
5.0000 mg | DELAYED_RELEASE_TABLET | Freq: Every day | ORAL | Status: DC | PRN
Start: 2024-07-14 — End: 2024-07-16

## 2024-07-14 MED ORDER — SODIUM CHLORIDE 0.9% FLUSH
3.0000 mL | Freq: Two times a day (BID) | INTRAVENOUS | Status: DC
  Administered 2024-07-14 – 2024-07-16 (×5): 3 mL via INTRAVENOUS

## 2024-07-14 MED ORDER — SODIUM CHLORIDE 0.9% FLUSH
3.0000 mL | Freq: Two times a day (BID) | INTRAVENOUS | Status: DC
  Administered 2024-07-14 – 2024-07-16 (×4): 3 mL via INTRAVENOUS

## 2024-07-14 MED ORDER — ACETAMINOPHEN 325 MG PO TABS: 650.0000 mg | ORAL_TABLET | Freq: Four times a day (QID) | ORAL | Status: DC | PRN

## 2024-07-14 MED ORDER — LIDOCAINE-EPINEPHRINE (PF) 2 %-1:200000 IJ SOLN
10.0000 mL | Freq: Once | INTRAMUSCULAR | Status: AC
  Administered 2024-07-14: 10 mL
  Filled 2024-07-14: qty 20

## 2024-07-14 MED ORDER — HYDROMORPHONE HCL 1 MG/ML IJ SOLN
0.5000 mg | INTRAMUSCULAR | Status: DC | PRN
  Administered 2024-07-15: 1 mg via INTRAVENOUS
  Filled 2024-07-14: qty 1

## 2024-07-14 MED ORDER — IPRATROPIUM BROMIDE 0.02 % IN SOLN: 0.5000 mg | Freq: Four times a day (QID) | RESPIRATORY_TRACT | Status: DC | PRN

## 2024-07-14 MED ORDER — DIAZEPAM 5 MG/ML IJ SOLN
2.5000 mg | INTRAMUSCULAR | Status: DC | PRN
Start: 2024-07-14 — End: 2024-07-17

## 2024-07-14 MED ORDER — OXYCODONE HCL 5 MG PO TABS
5.0000 mg | ORAL_TABLET | ORAL | Status: DC | PRN
  Administered 2024-07-14 – 2024-07-15 (×2): 5 mg via ORAL
  Filled 2024-07-14 (×2): qty 1

## 2024-07-14 MED ORDER — ADULT MULTIVITAMIN W/MINERALS CH
1.0000 | ORAL_TABLET | Freq: Every day | ORAL | Status: DC
  Administered 2024-07-14 – 2024-07-16 (×3): 1 via ORAL
  Filled 2024-07-14 (×3): qty 1

## 2024-07-14 NOTE — Assessment & Plan Note (Signed)
-   Patient reports that he continues to drink but has cut down quite a bit in the past few weeks -Plan to discharge after platelet transfusion -Patient was advised to slowly cut down and quit soon as possible

## 2024-07-14 NOTE — Assessment & Plan Note (Signed)
-   Acute on chronic pancytopenia, thrombocytopenia secondary to advanced liver cirrhosis secondary to chronic alcohol  use -Case was discussed with Dr. Rogers -Recommended to treat with vitamin K, 1 unit platelets, and 1U FFP -Recheck of blood -If stable will discharge home

## 2024-07-14 NOTE — Assessment & Plan Note (Signed)
-   Chronic follow-up as outpatient with GI for treatment

## 2024-07-14 NOTE — ED Notes (Signed)
 Per Shahmehdi, MD, Will keep the patient overnight to monitor LFTs platelets

## 2024-07-14 NOTE — ED Notes (Addendum)
 Per Willette, MD, pt to complete both plasma and platelets and then CBC to be redrawn an hour after completion of those blood products; However, if it takes several hours pt may be admitted. States pt may be able to be discharged with a knee brace

## 2024-07-14 NOTE — Assessment & Plan Note (Addendum)
-   Monitor closely, no excessive fluid buildup -Patient to follow-up with GI as an outpatient With elevated LFTs - Need to monitor, avoid hepatotoxins     Latest Ref Rng & Units 07/14/2024    2:55 AM 03/25/2024    2:43 PM 03/20/2024    5:30 PM  Hepatic Function  Total Protein 6.5 - 8.1 g/dL 6.5  6.8  6.6   Albumin  3.5 - 5.0 g/dL 2.8  2.5  2.4   AST 15 - 41 U/L 244  91  97   ALT 0 - 44 U/L 74  32  37   Alk Phosphatase 38 - 126 U/L 130  74  65   Total Bilirubin 0.0 - 1.2 mg/dL 4.3  2.6  3.6   Bilirubin, Direct 0.0 - 0.2 mg/dL 2.0

## 2024-07-14 NOTE — Assessment & Plan Note (Addendum)
-   Atraumatic left knee pain with swelling, healing blister laterally  Left knee x-ray reviewed-positive for effusion, hardware in place, s/p   arthrocentesis.  - Revealing few WBCs, no indicative of infection, suspected spontaneous hemarthrosis  EDP Dr. Roselyn discussed with Ortho Dr. Onesimo, who recommended no surgical intervention.  - Will continue with cold compresses, minimal mobilization -Follow-up with orthopedic team as outpatient

## 2024-07-14 NOTE — Assessment & Plan Note (Signed)
 Due to chronic alcohol  abuse, liver cirrhosis -No active site of bleeding -Monitoring CBC closely, For thrombocytopenia hematologist recommended transfusion of 1 unit FFP, 1 unit platelets

## 2024-07-14 NOTE — Discharge Instructions (Signed)
 Providers Accepting New Patients in [place], [place]    [facility] [address]  [place], [place] [address]J [phone] Accepts most insurances  [facility] [address] [place], [place] [address] [phone] Accepts most insurances  [facility] [address] [place], [place] [address]  [phone] Must meet requirements  [facility] [address] [place], [place] [address] [phone] Accepts most insurances  [facility] [address]  [place], [place] [address] [phone] Accepts most insurances  [facility] [address]   [place], [place]   [phone] Accepts most insurances  [facility])  [phone] [address]  [place], [place] [address] [phone] Accepts most insurances     [facility] [address]  [place], [place] [address] [phone] Accepts most insurances  [facility] [address] [place], [place] [address] [phone] option 1 Accepts Medicaid and [facility] [address]  [place], [place] [address] [phone] Accepts most insurances  Benita Outhouse, MD [address] [place], [place] [address] [phone] Accepts most insurances  [facility] [address]  [place], [place] [address] [phone] Accepts most insurances  [facility] [phone] [address] [place], [place] [address] [phone] Accepts most insurances  [place], [place] [address], [address] [place], [place] [address] [phone]  Accepts most insurances        IMPORTANT INFORMATION: PAY CLOSE ATTENTION   PHYSICIAN DISCHARGE INSTRUCTIONS  Follow with Primary care provider  Patient, [name]  and other consultants as instructed by your Hospitalist Physician  SEEK MEDICAL CARE OR RETURN TO EMERGENCY ROOM  IF SYMPTOMS COME BACK, WORSEN OR NEW PROBLEM DEVELOPS   Please note: You were cared for by a hospitalist during your hospital stay. Every effort will be made to forward records to your primary care provider.  You can request that your primary care provider send for your hospital records if they have not received them.  Once you are discharged, your primary care physician will handle any further medical issues. Please note that NO REFILLS for any discharge medications will be authorized once you are discharged, as it is imperative that you return to your primary care physician (or establish a relationship with a primary care physician if you do not have one) for your post hospital discharge needs so that they can reassess your need for medications and monitor your lab values.  Please get a complete blood count and chemistry panel checked by your Primary MD at your next visit, and again as instructed by your Primary MD.  Get Medicines reviewed and adjusted: Please take all your medications with you for your next visit with your Primary MD  Laboratory/radiological data: Please request your Primary MD to go over all hospital tests and procedure/radiological results at the follow up, please ask your primary care provider to get all Hospital records sent to his/her office.  In some cases, they will be blood work, cultures and biopsy results pending at the time of your discharge. Please request that your primary care provider follow up on these results.  If you are diabetic, please bring your blood sugar readings with you to your follow up appointment with primary care.    Please call and make your follow up appointments as soon as possible.    Also Note the following: If you  experience worsening of your admission symptoms, develop shortness of breath, life threatening emergency, suicidal or homicidal thoughts you must seek medical attention immediately by calling 911 or calling your MD immediately  if  symptoms less severe.  You must read complete instructions/literature along with all the possible adverse reactions/side effects for all the Medicines you take and that have been prescribed to you. Take any new Medicines after you have completely understood and accpet all the possible adverse reactions/side effects.   Do not drive when taking Pain medications or sleeping medications (Benzodiazepines)  Do not take more than prescribed Pain, Sleep and Anxiety Medications. It is not advisable to combine anxiety,sleep and pain medications without talking with your primary care practitioner  Special Instructions: If you have smoked or chewed Tobacco  in the last 2 yrs please stop smoking, stop any regular Alcohol  and or any Recreational drug use.  Wear Seat belts while driving.  Do not drive if taking any narcotic, mind altering or controlled substances or recreational drugs or alcohol.

## 2024-07-14 NOTE — ED Provider Notes (Signed)
 [place] EMERGENCY DEPARTMENT AT [facility]  Provider Note  CSN: [id number] Arrival date & time: 07/14/24 0200  History Chief Complaint  Patient presents with   Knee Pain    Gregory Murphy is a 38 y.o. male with history of alcohol  use disorder prior leg trauma reports 1 day of worsening L knee pain and swelling. No fever. He had a small boil on the lateral knee a few days ago that had drained spontaneously at home. No new falls or injuries.    Home Medications Prior to Admission medications   Medication Sig Start Date End Date Taking? Authorizing Provider  folic acid  (FOLVITE ) 1 MG tablet Take 1 tablet (1 mg total) by mouth daily. 03/16/24   Johnson, Clanford L, MD  furosemide  (LASIX ) 40 MG tablet Take 1 tablet (40 mg total) by mouth daily. 03/16/24   Johnson, Clanford L, MD  hydrOXYzine  (ATARAX ) 25 MG tablet Take 1-2 tablets (25-50 mg total) by mouth 3 (three) times daily as needed for itching. 03/15/24   Johnson, Clanford L, MD  lactulose  (CHRONULAC ) 10 GM/15ML solution Take 15 mLs (10 g total) by mouth 2 (two) times daily. WITH GOAL TO HAVE 3 BOWEL MOVEMENTS DAILY. 03/15/24   Vicci Afton CROME, MD  Multiple Vitamin (MULTIVITAMIN WITH MINERALS) TABS tablet Take 1 tablet by mouth daily. 03/16/24   Johnson, Clanford L, MD  pantoprazole  (PROTONIX ) 40 MG tablet Take 1 tablet (40 mg total) by mouth daily. 03/16/24   Johnson, Clanford L, MD  spironolactone  (ALDACTONE ) 100 MG tablet Take 1 tablet (100 mg total) by mouth daily. 03/16/24   Johnson, Clanford L, MD  thiamine  (VITAMIN B-1) 100 MG tablet Take 1 tablet (100 mg total) by mouth daily. 03/16/24   Johnson, Clanford L, MD  vitamin B-12 (CYANOCOBALAMIN ) 500 MCG tablet Take 500 mcg by mouth daily.    [provider]     Allergies    Latex   Review of Systems   Review of Systems Please see HPI for pertinent positives and negatives  Physical Exam BP 121/65   Pulse 66   Temp 98.2 F (36.8 C)   Resp 17    SpO2 96%   Physical Exam Vitals and nursing note reviewed.  HENT:     Head: Normocephalic.     Nose: Nose normal.  Eyes:     Extraocular Movements: Extraocular movements intact.  Pulmonary:     Effort: Pulmonary effort is normal.  Musculoskeletal:        General: Swelling and tenderness present.     Cervical back: Neck supple.     Comments: L knee pain/swelling; worse with movement, small scab on lateral knee without overlying infection.   Skin:    Findings: No rash (on exposed skin).  Neurological:     Mental Status: He is alert and oriented to person, place, and time.  Psychiatric:        Mood and Affect: Mood normal.     ED Results / Procedures / Treatments   EKG None  Procedures .Joint Aspiration/Arthrocentesis  Date/Time: 07/14/2024 3:34 AM  Performed by: Roselyn Carlin NOVAK, MD Authorized by: Roselyn Carlin NOVAK, MD   Consent:    Consent obtained:  Verbal   Consent given by:  Patient Location:    Location:  Knee   Knee:  L knee Anesthesia:    Anesthesia method:  Local infiltration   Local anesthetic:  Lidocaine  2% WITH epi Procedure details:    Preparation: Patient was prepped and  draped in usual sterile fashion     Needle gauge:  18 G   Approach:  Medial   Aspirate amount:  40   Aspirate characteristics:  Blood-tinged   Specimen collected: yes   Post-procedure details:    Dressing:  Adhesive bandage   Procedure completion:  Tolerated well, no immediate complications   Medications Ordered in the ED Medications  phytonadione  (VITAMIN K) tablet 5 mg (has no administration in time range)  0.9 %  sodium chloride  infusion (Manually program via Guardrails IV Fluids) (has no administration in time range)  lidocaine -EPINEPHrine  (XYLOCAINE  W/EPI) 2 %-1:200000 (PF) injection 10 mL (10 mLs Infiltration Given by Other 07/14/24 0334)  fentaNYL  (SUBLIMAZE ) injection 50 mcg (50 mcg Intravenous Given 07/14/24 0253)    Initial Impression and Plan  Patient here for  atraumatic L knee pain and swelling, concern for infection given overlying wound although no erythema or warmth to knee. Will check labs, Xray and plan arthrocentesis.   ED Course   Clinical Course as of 07/14/24 0714  Tue Jul 14, 2024  0252 I personally viewed the images from radiology studies and agree with radiologist interpretation: Xray shows effusion, hardware in place. No acute bony findings.  [CS]  0322 BMP is normal.  [CS]  0337 CBC with mild anemia and low platelets, has had thrombocytopenia before but this is lower than recent baseline.  [CS]  0341 Sed rate is normal.  [CS]  0403 LFT's elevated, consistent with EtOH use.  [CS]  0418 INR is moderately elevated [CS]  0440 Patient reports he has cut back on EtOH but still drinks multiple days per week.  [CS]  0557 Synovial cell count with only a few WBC, not indicative of infection. Suspect a spontaneous hemarthrosis due to his thrombocytopenia and liver disease. Will discuss with Ortho.  [CS]  [id number] Spoke with Dr. Onesimo, Ortho, who does not recommend any other acute surgical interventions. Will discuss need for PLT transfusion and/or FFP with Hematology [CS]  0636 Spoke with Dr. Rogers, Hematology, who recommends oral Vit K, as well as transfusion of 1Unit PLT and 1Unit FFP. Will plan admission to hospitalist.  [CS]    Clinical Course User Index [CS] Roselyn Carlin NOVAK, MD     MDM Rules/Calculators/A&P Medical Decision Making Problems Addressed: Alcoholic liver disease [facility]): chronic illness or injury Coagulopathy [facility]): chronic illness or injury Hemarthrosis of left knee: acute illness or injury Thrombocytopenia [facility]): chronic illness or injury  Amount and/or Complexity of Data Reviewed Labs: ordered. Decision-making details documented in ED Course. Radiology: ordered and independent interpretation performed. Decision-making details documented in ED Course.  Risk Prescription drug management. Parenteral controlled  substances. Decision regarding hospitalization.     Final Clinical Impression(s) / ED Diagnoses Final diagnoses:  Hemarthrosis of left knee  Thrombocytopenia (HCC)  Alcoholic liver disease (HCC)  Coagulopathy (HCC)    Rx / DC Orders ED Discharge Orders     None        Roselyn Carlin NOVAK, MD 07/14/24 [phone]

## 2024-07-14 NOTE — TOC Initial Note (Addendum)
 Transition of Care [facility]) - Initial/Assessment Note    Patient Details  Name: Gregory Murphy MRN: [id number] Date of Birth: Oct 16, 1986  Transition of Care [facility]) CM/SW Contact:    Sharlyne Stabs, RN Phone Number: 07/14/2024, 1:58 PM  Clinical Narrative:       Patient admitted with sever thrombocytopenia.  Lives at home with parents, has no PCP and need substance abuse resources.  Resources added to AVS, as well as [employer], local walk in clinic.  Accepting PCP list added.         Expected Discharge Plan: Home/Self Care Barriers to Discharge: Continued Medical Work up   Patient Goals and CMS Choice Patient states their goals for this hospitalization and ongoing recovery are:: return home     Expected Discharge Plan and Services    Home     Activities of Daily Living   ADL Screening (condition at time of admission) Independently performs ADLs?: Yes (appropriate for developmental age) Is the patient deaf or have difficulty hearing?: No Does the patient have difficulty seeing, even when wearing glasses/contacts?: No Does the patient have difficulty concentrating, remembering, or making decisions?: No  Permission Sought/Granted     Affect (typically observed): Accepting Orientation: : Oriented to Self, Oriented to Place, Oriented to  Time, Oriented to Situation Alcohol  / Substance Use: Alcohol  Use    Admission diagnosis:  Thrombocytopenia (HCC) [D69.6] Patient Active Problem List   Diagnosis Date Noted   Left knee pain 07/14/2024   Alcohol  abuse 07/14/2024   Pancytopenia (HCC) 07/14/2024   Decompensated cirrhosis (HCC) 03/10/2024   Alcoholic gastritis 03/03/2024   Severe thrombocytopenia (HCC) 03/03/2024   Electrolyte abnormality 03/03/2024   Macrocytic anemia 03/03/2024   Tobacco abuse 03/03/2024   HCV antibody positive 03/03/2024   Elevated AFP 03/03/2024   Alcoholic hepatitis with ascites 03/02/2024   Alcoholic cirrhosis of liver with ascites (HCC)  03/02/2024   Alcohol  withdrawal (HCC) 03/01/2024   Open displaced fracture of distal phalanx of left thumb 02/04/2024   Splenic rupture 09/07/2023   Right tibial fracture 12/22/2021   Malnutrition of moderate degree 12/21/2021   Femur fracture (HCC) 12/17/2021   PCP:  Patient, [name] Pharmacy:   [id number] - , [place] - [address] [place] [address] Phone: [phone] Fax: [phone]     Social Drivers of Health (SDOH) Social History: SDOH Screenings   Food Insecurity: No Food Insecurity (07/14/2024)  Housing: Low Risk  (07/14/2024)  Transportation Needs: No Transportation Needs (07/14/2024)  Utilities: Not At Risk (07/14/2024)  Tobacco Use: High Risk (07/14/2024)   SDOH Interventions:     Readmission Risk Interventions    03/10/2024    5:12 PM  Readmission Risk Prevention Plan  Transportation Screening Complete  PCP or Specialist Appt within 5-7 Days --  Home Care Screening Complete  Medication Review (RN CM) Complete

## 2024-07-14 NOTE — ED Notes (Signed)
 Titrated plasma from 120mL/hr to 230mL/hr--pt tolerating transfusion well, denies any discomfort

## 2024-07-14 NOTE — Hospital Course (Signed)
 Gregory Murphy is a 38 y.o. male with history of chronic alcohol  use/abuse with liver cirrhosis, pancytopenia, thrombocytopenia prior trauma to the leg reporting of worsening left knee pain and swelling.  Small boil on the lateral side of her knee which drained spontaneously no erythema edema. Presented to the ED for evaluation.  Noted for pancytopenia, thrombocytopenia Reports still drinks alcohol  but cut down quite a bit   ED evaluation: Blood pressure 121/65, pulse 66, temperature 98.2 F (36.8 C), resp. rate 17, SpO2 96%.  Labs: BMP normal, CBC  Left knee x-ray reviewed-positive for effusion, hardware in place, s/p   arthrocentesis.  - Revealing few WBCs, no indicative of infection, suspected spontaneous hemarthrosis   EDP Dr. Roselyn discussed with Ortho Dr. Onesimo, who recommended no surgical intervention.  Also discussed with hematology Dr. Rogers who recommended vitamin K, 1 unit platelet and 1U FFP transfusion.  Requested to be admitted for close observation rechecking CBC

## 2024-07-14 NOTE — ED Notes (Signed)
 Blood product(s) completion ended at 1307 order placed for cbc to be drawn at 1407

## 2024-07-14 NOTE — H&P (Addendum)
 History and Physical   Patient: Gregory Murphy                            PCP: Patient, [name]                    DOB: [birth year]            DOA: 07/14/2024 FMW:[id number]             DOS: 07/14/2024, 12:40 PM  Patient, [name]  Patient coming from:   HOME  I have personally reviewed patient's medical records, in electronic medical records, including:  [facility], and care everywhere.    Chief Complaint:   Chief Complaint  Patient presents with   Knee Pain    History of present illness:    Gregory Murphy is a 38 y.o. male with history of chronic alcohol  use/abuse with liver cirrhosis, pancytopenia, thrombocytopenia prior trauma to the leg reporting of worsening left knee pain and swelling.  Small boil on the lateral side of her knee which drained spontaneously no erythema edema. Presented to the ED for evaluation.  Noted for pancytopenia, thrombocytopenia Reports still drinks alcohol  but cut down quite a bit   ED evaluation: Blood pressure 121/65, pulse 66, temperature 98.2 F (36.8 C), resp. rate 17, SpO2 96%.  Labs: BMP normal, CBC  Left knee x-ray reviewed-positive for effusion, hardware in place, s/p   arthrocentesis.  - Revealing few WBCs, no indicative of infection, suspected spontaneous hemarthrosis   EDP Dr. Roselyn discussed with Ortho Dr. Onesimo, who recommended no surgical intervention.  Also discussed with hematology Dr. Rogers who recommended vitamin K, 1 unit platelet and 1U FFP transfusion.  Requested to be admitted for close observation rechecking CBC    Patient Denies having: Fever, Chills, Cough, SOB, Chest Pain, Abd pain, N/V/D, headache, dizziness, lightheadedness,  Dysuria, Joint pain, rash, open wounds    Review of Systems: As per HPI, otherwise 10 point review of systems were negative.   ----------------------------------------------------------------------------------------------------------------------  Allergies   Allergen Reactions   Latex Itching    Home MEDs:  Prior to Admission medications   Medication Sig Start Date End Date Taking? Authorizing Provider  folic acid  (FOLVITE ) 1 MG tablet Take 1 tablet (1 mg total) by mouth daily. 03/16/24   Johnson, Clanford L, MD  furosemide  (LASIX ) 40 MG tablet Take 1 tablet (40 mg total) by mouth daily. 03/16/24   Johnson, Clanford L, MD  hydrOXYzine  (ATARAX ) 25 MG tablet Take 1-2 tablets (25-50 mg total) by mouth 3 (three) times daily as needed for itching. 03/15/24   Johnson, Clanford L, MD  lactulose  (CHRONULAC ) 10 GM/15ML solution Take 15 mLs (10 g total) by mouth 2 (two) times daily. WITH GOAL TO HAVE 3 BOWEL MOVEMENTS DAILY. 03/15/24   Vicci Afton CROME, MD  Multiple Vitamin (MULTIVITAMIN WITH MINERALS) TABS tablet Take 1 tablet by mouth daily. 03/16/24   Johnson, Clanford L, MD  pantoprazole  (PROTONIX ) 40 MG tablet Take 1 tablet (40 mg total) by mouth daily. 03/16/24   Johnson, Clanford L, MD  spironolactone  (ALDACTONE ) 100 MG tablet Take 1 tablet (100 mg total) by mouth daily. 03/16/24   Johnson, Clanford L, MD  thiamine  (VITAMIN B-1) 100 MG tablet Take 1 tablet (100 mg total) by mouth daily. 03/16/24   Johnson, Clanford L, MD  vitamin B-12 (CYANOCOBALAMIN ) 500 MCG tablet Take 500 mcg by mouth daily.  [provider]    PRN MEDs: acetaminophen  **OR** acetaminophen , bisacodyl , LORazepam  **OR** diazepam , hydrALAZINE , HYDROmorphone  (DILAUDID ) injection, ipratropium, levalbuterol , ondansetron  **OR** ondansetron  (ZOFRAN ) IV, oxyCODONE , senna-docusate, sodium phosphate , zolpidem   Past Medical History:  Diagnosis Date   Alcohol  withdrawal (HCC) 03/01/2024   Alcoholic cirrhosis of liver with ascites (HCC) 03/02/2024   Alcoholic gastritis 03/03/2024   Alcoholic hepatitis with ascites 03/02/2024   Brain injury (HCC)    Cirrhosis with alcoholism (HCC)    Critical polytrauma 12/17/2021   Decompensated cirrhosis (HCC) 03/10/2024   Depression    Elevated  AFP 03/03/2024   HCV antibody positive 03/03/2024   Insomnia    Macrocytic anemia 03/03/2024   Malnutrition of moderate degree 12/21/2021   Panic attack    Severe thrombocytopenia (HCC) 03/03/2024   Splenic rupture 09/07/2023    Past Surgical History:  Procedure Laterality Date   ABDOMINAL SURGERY     CLOSED REDUCTION FINGER WITH PERCUTANEOUS PINNING Left 02/04/2024   Procedure: LEFT THUMB CLOSED REDUCTION FINGER WITH PERCUTANEOUS PINNing;  Surgeon: Arlinda Buster, MD;  Location: [facility];  Service: Orthopedics;  Laterality: Left;   DISTAL FEMUR BONE BRIDGE EXCISION Left    FEMUR CLOSED REDUCTION Left 12/17/2021   Procedure: CLOSED REDUCTION FEMORAL SHAFT;  Surgeon: Barton Drape, MD;  Location: [facility];  Service: Orthopedics;  Laterality: Left;   FOREARM SURGERY Left    x4-metal rods placed   I & D EXTREMITY Left 02/04/2024   Procedure: IRRIGATION AND DEBRIDEMENT OF LEFT THUMB;  Surgeon: Arlinda Buster, MD;  Location: [facility];  Service: Orthopedics;  Laterality: Left;   IR PARACENTESIS  03/02/2024   LEG SURGERY Right 2023   NAILBED REPAIR Left 02/04/2024   Procedure: LEFT THUMB NAILBED REPAIR;  Surgeon: Arlinda Buster, MD;  Location: [facility];  Service: Orthopedics;  Laterality: Left;   ORIF FEMUR FRACTURE Left 12/20/2021   Procedure: OPEN REDUCTION INTERNAL FIXATION (ORIF) DISTAL FEMUR FRACTURE;  Surgeon: Kendal Franky SQUIBB, MD;  Location: [facility];  Service: Orthopedics;  Laterality: Left;   TIBIA IM NAIL INSERTION Bilateral 12/17/2021   Procedure: RIGHT INTRAMEDULLARY (IM) NAIL TIBIAL; RIGHT CLOSED TREATMENT OF FIBULA FRACTURE; LEFT CLOSED REDUCTION SPLINTING OF PERIPROSTHETIC  SUPRACONDYLAR FEMUR FRACTURE;  Surgeon: Barton Drape, MD;  Location: [facility];  Service: Orthopedics;  Laterality: Bilateral;   WRIST ARTHROSCOPY WITH CARPOMETACARPEL [facility]) ARTHROPLASTY Left 03/21/2022   Procedure: left wrist arthroscopy with debridement as  needed;  Surgeon: Sissy Cough, MD;  Location:  SURGERY CENTER;  Service: Orthopedics;  Laterality: Left;  just needs 60 mins for surgery   WRIST SURGERY Right    pins     reports that he has been smoking cigarettes. He has a 7 pack-year smoking history. He has never used smokeless tobacco. He reports current alcohol  use of about 21.0 standard drinks of alcohol  per week. He reports that he does not currently use drugs.   History reviewed. No pertinent family history.  Physical Exam:   Vitals:   07/14/24 1053 07/14/24 1115 07/14/24 1144 07/14/24 1145  BP: 115/84 126/84 (!) 132/97 128/77  Pulse: 60 60 60 60  Resp: 18 11  19   Temp: 98.4 F (36.9 C)     TempSrc: Oral     SpO2: 98% 96%  96%   Constitutional: NAD, calm, comfortable Eyes: PERRL, lids and conjunctivae normal ENMT: Mucous membranes are moist. Posterior pharynx clear of any exudate or lesions.Normal dentition.  Neck: normal, supple, no masses, no thyromegaly Respiratory: clear to  auscultation bilaterally, no wheezing, no crackles. Normal respiratory effort. No accessory muscle use.  Cardiovascular: Regular rate and rhythm, no murmurs / rubs / gallops. No extremity edema. 2+ pedal pulses. No carotid bruits.  Abdomen: no tenderness, no masses palpated. No hepatosplenomegaly. Bowel sounds positive.  Musculoskeletal: Left knee pain, with edema, tenderness, small lateral wound with minimal erythema, scab in place no clubbing / cyanosis. No jother joint deformity upper and lower extremities. Good ROM, no contractures. Normal muscle tone.  Neurologic: CN II-XII grossly intact. Sensation intact, DTR normal. Strength 5/5 in all 4.  Psychiatric: Normal judgment and insight. Alert and oriented x 3. Normal mood.  Skin: no rashes, lesions, ulcers. No induration .            Labs on admission:    I have personally reviewed following labs and imaging studies  CBC: Recent Labs  Lab 07/14/24 0255  WBC 4.0   NEUTROABS 2.4  HGB 11.7*  HCT 33.4*  MCV 102.8*  PLT 32*   Basic Metabolic Panel: Recent Labs  Lab 07/14/24 0255  NA 137  K 3.8  CL 100  CO2 26  GLUCOSE 108*  BUN 6  CREATININE 0.58*  CALCIUM  8.5*  MG 1.7  PHOS 3.6   GFR: CrCl cannot be calculated (Unknown ideal weight.). Liver Function Tests: Recent Labs  Lab 07/14/24 0255  AST 244*  ALT 74*  ALKPHOS 130*  BILITOT 4.3*  PROT 6.5  ALBUMIN  2.8*   No results for input(s): LIPASE, AMYLASE in the last 168 hours. No results for input(s): AMMONIA in the last 168 hours. Coagulation Profile: Recent Labs  Lab 07/14/24 0255  INR 1.7*       Component Value Date/Time   COLORURINE AMBER (A) 03/10/2024 0426   APPEARANCEUR CLEAR 03/10/2024 0426   LABSPEC 1.030 03/10/2024 0426   PHURINE 5.0 03/10/2024 0426   GLUCOSEU NEGATIVE 03/10/2024 0426   HGBUR NEGATIVE 03/10/2024 0426   BILIRUBINUR SMALL (A) 03/10/2024 0426   KETONESUR NEGATIVE 03/10/2024 0426   PROTEINUR 30 (A) 03/10/2024 0426   NITRITE NEGATIVE 03/10/2024 0426   LEUKOCYTESUR NEGATIVE 03/10/2024 0426    Last A1C:  No results found for: HGBA1C   Radiologic Exams on Admission:   DG Knee Complete 4 Views Left Result Date: 07/14/2024 CLINICAL DATA:  Left knee pain and swelling EXAM: LEFT KNEE - COMPLETE 4+ VIEW COMPARISON:  None Available. FINDINGS: Status post distal left femoral ORIF normal alignment. No acute fracture or dislocation. Moderate left knee effusion. Soft tissues are otherwise unremarkable. IMPRESSION: 1. Moderate left knee effusion. No acute fracture or dislocation. Electronically Signed   By: Dorethia Molt M.D.   On: 07/14/2024 02:49    EKG:   Independently reviewed.  Orders placed or performed during the hospital encounter of 07/14/24   EKG 12-Lead   ---------------------------------------------------------------------------------------------------------------------------------------    Assessment / Plan:   Principal  Problem:   Severe thrombocytopenia (HCC) Active Problems:   HCV antibody positive   Left knee pain   Alcoholic cirrhosis of liver with ascites (HCC)   Alcoholic hepatitis with ascites   Open displaced fracture of distal phalanx of left thumb   Alcohol  abuse   Pancytopenia (HCC)   Assessment and Plan: * Severe thrombocytopenia (HCC) - Acute on chronic pancytopenia, thrombocytopenia secondary to advanced liver cirrhosis secondary to chronic alcohol  use -Case was discussed with Dr. Rogers -Recommended to treat with vitamin K, 1 unit platelets, and 1U FFP -Recheck of blood -If stable will discharge home  Left knee pain -  Atraumatic left knee pain with swelling, healing blister laterally  Left knee x-ray reviewed-positive for effusion, hardware in place, s/p   arthrocentesis.  - Revealing few WBCs, no indicative of infection, suspected spontaneous hemarthrosis  EDP Dr. Roselyn discussed with Ortho Dr. Onesimo, who recommended no surgical intervention.  - Will continue with cold compresses, minimal mobilization -Follow-up with orthopedic team as outpatient  HCV antibody positive - Chronic follow-up as outpatient with GI for treatment  Alcoholic hepatitis with ascites - Monitor closely, no excessive fluid buildup -Patient to follow-up with GI as an outpatient With elevated LFTs - Need to monitor, avoid hepatotoxins     Latest Ref Rng & Units 07/14/2024    2:55 AM 03/25/2024    2:43 PM 03/20/2024    5:30 PM  Hepatic Function  Total Protein 6.5 - 8.1 g/dL 6.5  6.8  6.6   Albumin  3.5 - 5.0 g/dL 2.8  2.5  2.4   AST 15 - 41 U/L 244  91  97   ALT 0 - 44 U/L 74  32  37   Alk Phosphatase 38 - 126 U/L 130  74  65   Total Bilirubin 0.0 - 1.2 mg/dL 4.3  2.6  3.6   Bilirubin, Direct 0.0 - 0.2 mg/dL 2.0        Pancytopenia (HCC) Due to chronic alcohol  abuse, liver cirrhosis -No active site of bleeding -Monitoring CBC closely, For thrombocytopenia hematologist recommended  transfusion of 1 unit FFP, 1 unit platelets  Alcohol  abuse - Patient reports that he continues to drink but has cut down quite a bit in the past few weeks -Plan to discharge after platelet transfusion -Patient was advised to slowly cut down and quit soon as possible              Consults called: Orthopedic/hematologist -------------------------------------------------------------------------------------------------------------------------------------------- DVT prophylaxis:  SCDs Start: 07/14/24 0819   Code Status:   Code Status: Full Code   Admission status: Patient will be admitted as Observation, with a greater than 2 midnight length of stay. Level of care: Med-Surg   Family Communication:  none at bedside  (The above findings and plan of care has been discussed with patient in detail, the patient expressed understanding and agreement of above plan)  --------------------------------------------------------------------------------------------------------------------------------------------------  Disposition Plan:  Anticipated 1-2 days Status is: Observation The patient remains OBS appropriate and will d/c before 2 midnights.     ----------------------------------------------------------------------------------------------------------------------------------------------------  Time spent:  19  Min.  Was spent seeing and evaluating the patient, reviewing all medical records, drawn plan of care.  SIGNED: Adriana DELENA Grams, MD, FHM. FAAFP. [facility], Pager  (Please use [email or web address] to page/ or secure chat through epic) If 7PM-7AM, please contact night-coverage [email or web address],  07/14/2024, 12:40 PM

## 2024-07-14 NOTE — ED Notes (Signed)
Pt signed consent for blood products.

## 2024-07-14 NOTE — ED Triage Notes (Signed)
 Pt bib [facility] c/o left knee pain, swelling noted. Denies any recent injury

## 2024-07-14 NOTE — ED Notes (Signed)
 IV plasma titrated from 120mL/hr to 200mL/hr, pt tolerating transfusion well, denies any discomfort

## 2024-07-15 DIAGNOSIS — D696 Thrombocytopenia, unspecified: Secondary | ICD-10-CM | POA: Diagnosis present

## 2024-07-15 DIAGNOSIS — D689 Coagulation defect, unspecified: Secondary | ICD-10-CM | POA: Diagnosis present

## 2024-07-15 DIAGNOSIS — D6959 Other secondary thrombocytopenia: Secondary | ICD-10-CM | POA: Diagnosis present

## 2024-07-15 DIAGNOSIS — M25062 Hemarthrosis, left knee: Secondary | ICD-10-CM | POA: Diagnosis present

## 2024-07-15 DIAGNOSIS — F1010 Alcohol abuse, uncomplicated: Secondary | ICD-10-CM | POA: Diagnosis present

## 2024-07-15 DIAGNOSIS — K7031 Alcoholic cirrhosis of liver with ascites: Secondary | ICD-10-CM

## 2024-07-15 DIAGNOSIS — K7011 Alcoholic hepatitis with ascites: Secondary | ICD-10-CM | POA: Diagnosis present

## 2024-07-15 DIAGNOSIS — R768 Other specified abnormal immunological findings in serum: Secondary | ICD-10-CM

## 2024-07-15 DIAGNOSIS — D61818 Other pancytopenia: Secondary | ICD-10-CM | POA: Diagnosis present

## 2024-07-15 DIAGNOSIS — Z79899 Other long term (current) drug therapy: Secondary | ICD-10-CM | POA: Diagnosis not present

## 2024-07-15 DIAGNOSIS — M25562 Pain in left knee: Secondary | ICD-10-CM

## 2024-07-15 DIAGNOSIS — L0292 Furuncle, unspecified: Secondary | ICD-10-CM | POA: Diagnosis present

## 2024-07-15 DIAGNOSIS — S62522A Displaced fracture of distal phalanx of left thumb, initial encounter for closed fracture: Secondary | ICD-10-CM | POA: Diagnosis present

## 2024-07-15 DIAGNOSIS — Z91040 Latex allergy status: Secondary | ICD-10-CM | POA: Diagnosis not present

## 2024-07-15 DIAGNOSIS — Z87891 Personal history of nicotine dependence: Secondary | ICD-10-CM | POA: Diagnosis not present

## 2024-07-15 LAB — PREPARE PLATELET PHERESIS: Unit division: 0

## 2024-07-15 LAB — COMPREHENSIVE METABOLIC PANEL WITH GFR
ALT: 53 U/L — ABNORMAL HIGH (ref 0–44)
AST: 158 U/L — ABNORMAL HIGH (ref 15–41)
Albumin: 2.5 g/dL — ABNORMAL LOW (ref 3.5–5.0)
Alkaline Phosphatase: 105 U/L (ref 38–126)
Anion gap: 4 — ABNORMAL LOW (ref 5–15)
BUN: 7 mg/dL (ref 6–20)
CO2: 26 mmol/L (ref 22–32)
Calcium: 8.2 mg/dL — ABNORMAL LOW (ref 8.9–10.3)
Chloride: 103 mmol/L (ref 98–111)
Creatinine, Ser: 0.6 mg/dL — ABNORMAL LOW (ref 0.61–1.24)
GFR, Estimated: >60 mL/min (ref >60)
Glucose, Bld: 82 mg/dL (ref 70–99)
Potassium: 4 mmol/L (ref 3.5–5.1)
Sodium: 133 mmol/L — ABNORMAL LOW (ref 135–145)
Total Bilirubin: 4.2 mg/dL — ABNORMAL HIGH (ref 0.0–1.2)
Total Protein: 5.8 g/dL — ABNORMAL LOW (ref 6.5–8.1)

## 2024-07-15 LAB — BPAM PLATELET PHERESIS
Blood Product Expiration Date: 202508012359
ISSUE DATE / TIME: 202507291024
Unit Type and Rh: 5100

## 2024-07-15 LAB — BPAM FFP
Blood Product Expiration Date: 202508032359
ISSUE DATE / TIME: 202507290816
Unit Type and Rh: 6200

## 2024-07-15 LAB — PREPARE FRESH FROZEN PLASMA

## 2024-07-15 LAB — CBC
HCT: 30.6 % — ABNORMAL LOW (ref 39.0–52.0)
Hemoglobin: 10.7 g/dL — ABNORMAL LOW (ref 13.0–17.0)
MCH: 36.4 pg — ABNORMAL HIGH (ref 26.0–34.0)
MCHC: 35 g/dL (ref 30.0–36.0)
MCV: 104.1 fL — ABNORMAL HIGH (ref 80.0–100.0)
Platelets: 46 K/uL — ABNORMAL LOW (ref 150–400)
RBC: 2.94 MIL/uL — ABNORMAL LOW (ref 4.22–5.81)
RDW: 14.6 % (ref 11.5–15.5)
WBC: 4.1 K/uL (ref 4.0–10.5)
nRBC: 0 % (ref 0.0–0.2)

## 2024-07-15 MED ORDER — HYDROMORPHONE HCL 1 MG/ML IJ SOLN
0.5000 mg | INTRAMUSCULAR | Status: DC | PRN
  Administered 2024-07-15 – 2024-07-16 (×3): 0.75 mg via INTRAVENOUS
  Filled 2024-07-15 (×5): qty 1

## 2024-07-15 NOTE — Evaluation (Signed)
 Physical Therapy Evaluation Patient Details Name: Gregory Murphy MRN: [id number] DOB: 07-Aug-1986 Today's Date: 07/15/2024  History of Present Illness  Gregory Murphy is a 38 y.o. male with history of chronic alcohol  use/abuse with liver cirrhosis, pancytopenia, thrombocytopenia prior trauma to the leg reporting of worsening left knee pain and swelling.  Small boil on the lateral side of her knee which drained spontaneously no erythema edema.  Presented to the ED for evaluation.  Noted for pancytopenia, thrombocytopenia  Reports still drinks alcohol  but cut down quite a bit   Clinical Impression  Patient able to complete bed mobility and transfer tasks with increased time and effort, being mostly limited by Lt knee pain and edema. Patient ambulated 62ft using trial axillary crutches for LLE offloading; demonstrated good tolerance and safe use. Crutches issued and properly adjusted to patient for increased functional independence. Patient returned to bed at the end of the evaluation due to c/o Lt knee pain. Patient will benefit from continued skilled physical therapy in hospital and recommended venue below to increase strength, balance, endurance for safe ADLs and gait.         If plan is discharge home, recommend the following: Help with stairs or ramp for entrance;Assist for transportation;A little help with walking and/or transfers;A little help with bathing/dressing/bathroom   Can travel by private vehicle        Equipment Recommendations Crutches  Recommendations for Other Services       Functional Status Assessment Patient has had a recent decline in their functional status and demonstrates the ability to make significant improvements in function in a reasonable and predictable amount of time.     Precautions / Restrictions Precautions Precautions: Fall Recall of Precautions/Restrictions: Intact Restrictions Weight Bearing Restrictions Per Provider Order: No       Mobility  Bed Mobility Overal bed mobility: Independent             General bed mobility comments: labored movement, increased time due to pain    Transfers Overall transfer level: Modified independent Equipment used: Crutches               General transfer comment: Able to complete self transfer with AD, increased time, c/o increased knee pain    Ambulation/Gait Ambulation/Gait assistance: Modified independent (Device/Increase time) Gait Distance (Feet): 75 Feet Assistive device: Crutches Gait Pattern/deviations: Antalgic, Step-to pattern, Decreased step length - right, Decreased step length - left, Decreased stance time - left Gait velocity: decreased     General Gait Details: ambulated with crutches, WBAT on LLE, c/o increased knee pain  Stairs            Wheelchair Mobility     Tilt Bed    Modified Rankin (Stroke Patients Only)       Balance Overall balance assessment: Needs assistance Sitting-balance support: Feet supported, No upper extremity supported Sitting balance-Leahy Scale: Good Sitting balance - Comments: seated EOB     Standing balance-Leahy Scale: Poor Standing balance comment: using axillary crutches                             Pertinent Vitals/Pain Pain Assessment Pain Assessment: Faces Faces Pain Scale: Hurts whole lot Pain Location: Lt knee Pain Descriptors / Indicators: Discomfort, Throbbing, Stabbing Pain Intervention(s): Limited activity within patient's tolerance, Monitored during session    Home Living Family/patient expects to be discharged to:: Private residence Living Arrangements: Parent Available Help at Discharge: Family;Friend(s);Available 24 hours/day Type  of Home: House Home Access: Stairs to enter Entrance Stairs-Rails: None Entrance Stairs-Number of Steps: 5   Home Layout: One level Home Equipment: Agricultural consultant (2 wheels)      Prior Function Prior Level of Function :  Independent/Modified Independent;Driving             Mobility Comments: Independent with all mobility needs ADLs Comments: Independent with all ADLs     Extremity/Trunk Assessment        Lower Extremity Assessment Lower Extremity Assessment: Generalized weakness;LLE deficits/detail LLE Deficits / Details: Severe Lt knee pain, observable edema LLE: Unable to fully assess due to pain    Cervical / Trunk Assessment Cervical / Trunk Assessment: Normal  Communication   Communication Communication: No apparent difficulties    Cognition Arousal: Alert Behavior During Therapy: WFL for tasks assessed/performed   PT - Cognitive impairments: No apparent impairments                         Following commands: Intact       Cueing Cueing Techniques: Verbal cues, Tactile cues     General Comments General comments (skin integrity, edema, etc.): notable swelling to Lt knee    Exercises     Assessment/Plan    PT Assessment Patient needs continued PT services  PT Problem List Decreased strength;Decreased balance;Decreased mobility;Decreased range of motion;Decreased activity tolerance       PT Treatment Interventions DME instruction;Therapeutic activities;Gait training;Therapeutic exercise;Stair training;Balance training;Functional mobility training    PT Goals (Current goals can be found in the Care Plan section)  Acute Rehab PT Goals Patient Stated Goal: return home PT Goal Formulation: With patient Time For Goal Achievement: 07/29/24 Potential to Achieve Goals: Good    Frequency Min 3X/week     Co-evaluation               AM-PAC PT 6 Clicks Mobility  Outcome Measure Help needed turning from your back to your side while in a flat bed without using bedrails?: None Help needed moving from lying on your back to sitting on the side of a flat bed without using bedrails?: None Help needed moving to and from a bed to a chair (including a  wheelchair)?: None Help needed standing up from a chair using your arms (e.g., wheelchair or bedside chair)?: A Little Help needed to walk in hospital room?: A Little Help needed climbing 3-5 steps with a railing? : A Lot 6 Click Score: 20    End of Session Equipment Utilized During Treatment: Gait belt Activity Tolerance: Patient limited by pain Patient left: in bed;Other (comment);with call bell/phone within reach (ice reapplied to knee) Nurse Communication: Mobility status PT Visit Diagnosis: Muscle weakness (generalized) (M62.81);Other abnormalities of gait and mobility (R26.89);Pain;Unsteadiness on feet (R26.81) Pain - Right/Left: Left Pain - part of body: Knee    Time: 9050-8984 PT Time Calculation (min) (ACUTE ONLY): 26 min   Charges:   PT Evaluation $PT Eval Moderate Complexity: 1 Mod PT Treatments $Therapeutic Activity: 23-37 mins PT General Charges $$ ACUTE PT VISIT: 1 Visit        1:57 PM, 07/15/24,  Onnie Como, SPT

## 2024-07-15 NOTE — Plan of Care (Signed)
  Problem: Acute Rehab PT Goals(only PT should resolve) Goal: Pt Will Go Supine/Side To Sit Outcome: Progressing Flowsheets (Taken 07/15/2024 1401) Pt will go Supine/Side to Sit: Independently Goal: Patient Will Transfer Sit To/From Stand Outcome: Progressing Flowsheets (Taken 07/15/2024 1401) Patient will transfer sit to/from stand: Independently Goal: Pt Will Transfer Bed To Chair/Chair To Bed Outcome: Progressing Flowsheets (Taken 07/15/2024 1401) Pt will Transfer Bed to Chair/Chair to Bed: Independently Goal: Pt Will Ambulate Outcome: Progressing Flowsheets (Taken 07/15/2024 1401) Pt will Ambulate:  with least restrictive assistive device  > 125 feet  Independently  with modified independence

## 2024-07-15 NOTE — Progress Notes (Signed)
 PROGRESS NOTE   Gregory Murphy  FMW:[id number] DOB: September 15, 1986 DOA: 07/14/2024 PCP: Patient, [name]   Chief Complaint  Patient presents with   Knee Pain   Level of care: Med-Surg  Brief Admission History:  Gregory Murphy is a 38 y.o. male with history of chronic alcohol  use/abuse with liver cirrhosis, pancytopenia, thrombocytopenia prior trauma to the leg reporting of worsening left knee pain and swelling.  Small boil on the lateral side of her knee which drained spontaneously no erythema edema. Presented to the ED for evaluation.  Noted for pancytopenia, thrombocytopenia Reports still drinks alcohol  but cut down quite a bit   ED evaluation: Blood pressure 121/65, pulse 66, temperature 98.2 F (36.8 C), resp. rate 17, SpO2 96%.  Labs: BMP normal, CBC  Left knee x-ray reviewed-positive for effusion, hardware in place, s/p   arthrocentesis.  - Revealing few WBCs, no indicative of infection, suspected spontaneous hemarthrosis   EDP Dr. Roselyn discussed with Ortho Dr. Onesimo, who recommended no surgical intervention.  Also discussed with hematology Dr. Rogers who recommended vitamin K, 1 unit platelet and 1U FFP transfusion.  Requested to be admitted for close observation rechecking CBC   Assessment and Plan:  Severe thrombocytopenia - Acute on chronic pancytopenia, thrombocytopenia secondary to advanced liver cirrhosis secondary to chronic alcohol  use -Case was discussed with Dr. Rogers -Recommended to treat with vitamin K, 1 unit platelets, and 1U FFP -recheck in AM   Pancytopenia Due to chronic alcohol  abuse, liver cirrhosis -No active site of bleeding -Monitoring CBC closely, For thrombocytopenia hematologist recommended transfusion of 1 unit FFP, 1 unit platelets on 07/14/24  Alcohol  abuse - Patient reports that he continues to drink but has cut down quite a bit in the past few weeks - Patient was advised to slowly cut down and quit soon as  possible - CIWA protocol   Left knee pain - Atraumatic left knee pain with swelling, healing blister laterally - he is s/p arthrocentesis in ED on 7/29 - today he is having increased pain and swelling, suspect is hemorrhagic/traumatic effusion - discussed with Dr Onesimo, orthopedics, he recommended supportive management only, no need for surgery or more imaging. . . Pt still will need close outpatient ortho follow up as he recommended yesterday  Left knee x-ray reviewed-positive for effusion, hardware in place, s/p   arthrocentesis.  - Revealing few WBCs, no indicative of infection, suspected spontaneous hemarthrosis  EDP Dr. Roselyn discussed with Ortho Dr. Onesimo in ED on 7/29, who recommended no surgical intervention.  - Will continue with cold compresses, minimal mobilization -Follow-up with orthopedic team as outpatient  HCV antibody positive - Chronic follow-up as outpatient with GI for treatment  Alcoholic hepatitis with ascites - Monitor closely, no excessive fluid buildup -Patient to follow-up with GI as an outpatient With elevated LFTs - Need to monitor, avoid hepatotoxins     Latest Ref Rng & Units 07/14/2024    2:55 AM 03/25/2024    2:43 PM 03/20/2024    5:30 PM  Hepatic Function  Total Protein 6.5 - 8.1 g/dL 6.5  6.8  6.6   Albumin  3.5 - 5.0 g/dL 2.8  2.5  2.4   AST 15 - 41 U/L 244  91  97   ALT 0 - 44 U/L 74  32  37   Alk Phosphatase 38 - 126 U/L 130  74  65   Total Bilirubin 0.0 - 1.2 mg/dL 4.3  2.6  3.6   Bilirubin, Direct 0.0 -  0.2 mg/dL 2.0       DVT prophylaxis: SCDs Code Status: Full  Family Communication:  Disposition: home tomorrow   Consultants:  Orthopedics  Procedures:  Left knee arthrocentesis 7/29 Antimicrobials:    Subjective: Pt complaining of severe swelling and pain of left knee since arthrocentesis yesterday.   Objective: Vitals:   07/14/24 2007 07/15/24 0450 07/15/24 0926 07/15/24 1313  BP: 120/73 135/74 135/74 (!) 132/96  Pulse:  72 (!) 58 (!) 58 91  Resp: 20 20  18   Temp: 98.6 F (37 C) 98.1 F (36.7 C)  98.1 F (36.7 C)  TempSrc: Oral Oral    SpO2: 98% 94%  94%  Weight:  51.4 kg    Height:        Intake/Output Summary (Last 24 hours) at 07/15/2024 1420 Last data filed at 07/15/2024 1315 Gross per 24 hour  Intake 1128.33 ml  Output 2650 ml  Net -1521.67 ml   Filed Weights   07/14/24 1636 07/15/24 0450  Weight: 51.8 kg 51.4 kg   Examination:  General exam: Appears calm and Uncomfortable  Respiratory system: Clear to auscultation. Respiratory effort normal. Cardiovascular system: normal S1 & S2 heard. No JVD, murmurs, rubs, gallops or clicks. No pedal edema. Gastrointestinal system: Abdomen is nondistended, soft and nontender. No organomegaly or masses felt. Normal bowel sounds heard. Central nervous system: Alert and oriented. No focal neurological deficits. Extremities: swollen left knee painful to touch. Skin: No rashes, lesions or ulcers. Psychiatry: Judgement and insight appear normal. Mood & affect appropriate.   Data Reviewed: I have personally reviewed following labs and imaging studies  CBC: Recent Labs  Lab 07/14/24 0255 07/14/24 1421 07/14/24 1605 07/15/24 0446  WBC 4.0 3.8* 3.9* 4.1  NEUTROABS 2.4  --   --   --   HGB 11.7* 10.0* 10.3* 10.7*  HCT 33.4* 29.5* 29.5* 30.6*  MCV 102.8* 103.9* 104.2* 104.1*  PLT 32* 51* 50* 46*    Basic Metabolic Panel: Recent Labs  Lab 07/14/24 0255 07/15/24 0446  NA 137 133*  K 3.8 4.0  CL 100 103  CO2 26 26  GLUCOSE 108* 82  BUN 6 7  CREATININE 0.58* 0.60*  CALCIUM  8.5* 8.2*  MG 1.7  --   PHOS 3.6  --     CBG: No results for input(s): GLUCAP in the last 168 hours.  Recent Results (from the past 240 hours)  Body fluid culture w Gram Stain     Status: None (Preliminary result)   Collection Time: 07/14/24  3:34 AM   Specimen: Synovium; Body Fluid  Result Value Ref Range Status   Specimen Description   Final     SYNOVIAL Performed at [facility], [address], [place], [place] [address]    Special Requests   Final    LEFT KNEE Performed at [facility], [address] [place], [place] [address]    Gram Stain   Final    FEW WBC PRESENT, PREDOMINANTLY MONONUCLEAR NO ORGANISMS SEEN Performed at [facility] [address], [place], [place] [address]    Culture   Final    NO GROWTH 1 DAY Performed at [facility], [address] [place], [place] [address]    Report Status PENDING  Incomplete     Radiology Studies: DG Knee Complete 4 Views Left Result Date: 07/14/2024 CLINICAL DATA:  Left knee pain and swelling EXAM: LEFT KNEE - COMPLETE 4+ VIEW COMPARISON:  None Available. FINDINGS: Status post distal left femoral ORIF  normal alignment. No acute fracture or dislocation. Moderate left knee effusion. Soft tissues are otherwise unremarkable. IMPRESSION: 1. Moderate left knee effusion. No acute fracture or dislocation. Electronically Signed   By: Dorethia Molt M.D.   On: 07/14/2024 02:49    Scheduled Meds:  folic acid   1 mg Oral Daily   multivitamin with minerals  1 tablet Oral Daily   sodium chloride  flush  3 mL Intravenous Q12H   sodium chloride  flush  3 mL Intravenous Q12H   thiamine   100 mg Oral Daily   Or   thiamine   100 mg Intravenous Daily   Continuous Infusions:   LOS: 0 days   Time spent: 55 mins  Berlin Mokry Vicci, MD How to contact the [facility] Attending or Consulting provider 7A - 7P or covering provider during after hours 7P -7A, for this patient?  Check the care team in [facility] and look for a) attending/consulting TRH provider listed and b) the TRH team listed Log into [email or web address] to find provider on call.  Locate the TRH provider you are looking for under [facility] and page to a number that you can be directly reached. If you still have difficulty reaching the provider, please page the [facility] (Director on Call) for the Hospitalists listed on amion for  assistance.  07/15/2024, 2:20 PM

## 2024-07-16 ENCOUNTER — Encounter: Payer: Self-pay | Admitting: Gastroenterology

## 2024-07-16 DIAGNOSIS — K7031 Alcoholic cirrhosis of liver with ascites: Secondary | ICD-10-CM | POA: Diagnosis not present

## 2024-07-16 DIAGNOSIS — G8929 Other chronic pain: Secondary | ICD-10-CM

## 2024-07-16 DIAGNOSIS — D696 Thrombocytopenia, unspecified: Secondary | ICD-10-CM | POA: Diagnosis not present

## 2024-07-16 DIAGNOSIS — F1010 Alcohol abuse, uncomplicated: Secondary | ICD-10-CM | POA: Diagnosis not present

## 2024-07-16 DIAGNOSIS — D61818 Other pancytopenia: Secondary | ICD-10-CM | POA: Diagnosis not present

## 2024-07-16 LAB — COMPREHENSIVE METABOLIC PANEL WITH GFR
ALT: 49 U/L — ABNORMAL HIGH (ref 0–44)
AST: 126 U/L — ABNORMAL HIGH (ref 15–41)
Albumin: 2.7 g/dL — ABNORMAL LOW (ref 3.5–5.0)
Alkaline Phosphatase: 126 U/L (ref 38–126)
Anion gap: 6 (ref 5–15)
BUN: 9 mg/dL (ref 6–20)
CO2: 26 mmol/L (ref 22–32)
Calcium: 8.4 mg/dL — ABNORMAL LOW (ref 8.9–10.3)
Chloride: 101 mmol/L (ref 98–111)
Creatinine, Ser: 0.7 mg/dL (ref 0.61–1.24)
GFR, Estimated: >60 mL/min (ref >60)
Glucose, Bld: 98 mg/dL (ref 70–99)
Potassium: 4.3 mmol/L (ref 3.5–5.1)
Sodium: 133 mmol/L — ABNORMAL LOW (ref 135–145)
Total Bilirubin: 3 mg/dL — ABNORMAL HIGH (ref 0.0–1.2)
Total Protein: 6.1 g/dL — ABNORMAL LOW (ref 6.5–8.1)

## 2024-07-16 LAB — CBC
HCT: 28.9 % — ABNORMAL LOW (ref 39.0–52.0)
Hemoglobin: 9.9 g/dL — ABNORMAL LOW (ref 13.0–17.0)
MCH: 36.4 pg — ABNORMAL HIGH (ref 26.0–34.0)
MCHC: 34.3 g/dL (ref 30.0–36.0)
MCV: 106.3 fL — ABNORMAL HIGH (ref 80.0–100.0)
Platelets: 41 K/uL — ABNORMAL LOW (ref 150–400)
RBC: 2.72 MIL/uL — ABNORMAL LOW (ref 4.22–5.81)
RDW: 14.8 % (ref 11.5–15.5)
WBC: 4.7 K/uL (ref 4.0–10.5)
nRBC: 0 % (ref 0.0–0.2)

## 2024-07-16 MED ORDER — VITAMIN B-1 100 MG PO TABS: 100.0000 mg | ORAL_TABLET | Freq: Every day | ORAL | 1 refills | Status: DC

## 2024-07-16 MED ORDER — FOLIC ACID 1 MG PO TABS: 1.0000 mg | ORAL_TABLET | Freq: Every day | ORAL | 1 refills | Status: DC

## 2024-07-16 MED ORDER — LACTULOSE 10 GM/15ML PO SOLN
10.0000 g | Freq: Two times a day (BID) | ORAL | 0 refills | Status: DC
Start: 2024-07-16 — End: 2024-10-08

## 2024-07-16 NOTE — Progress Notes (Signed)
 Mobility Specialist Progress Note:    07/16/24 1150  Mobility  Activity Ambulated with assistance  Level of Assistance Modified independent, requires aide device or extra time  Assistive Device Crutches  Distance Ambulated (ft) 50 ft  Range of Motion/Exercises Active;All extremities  Activity Response Tolerated well  Mobility visit 1 Mobility  Mobility Specialist Start Time (ACUTE ONLY) 1136  Mobility Specialist Stop Time (ACUTE ONLY) 1150  Mobility Specialist Time Calculation (min) (ACUTE ONLY) 14 min   Pt received ambulating, agreeable to further mobility and assistance. ModI to stand and ambulate with crutches. Tolerated well, asx throughout. Returned to room, all needs met.   Sherrilee Ditty Mobility Specialist Please contact via Special educational needs teacher or  Rehab office at [phone]

## 2024-07-16 NOTE — TOC Transition Note (Addendum)
 Transition of Care [facility]) - Discharge Note   Patient Details  Name: Gregory Murphy MRN: [id number] Date of Birth: [birth year]  [facility] CM/SW Contact:  Sharlyne Stabs, RN Phone Number: 07/16/2024, 10:31 AM   Clinical Narrative:   Patient discharging home, RN will give crutches. CM at the bedside to review and give handout to [facility]. Patient will make an appointment. He is not interested in out Patient PT services. Ready to DC, waiting on his ride.   Addendum : patient is telling RN he wants Outpatient PT.  Orders place, Patient will follow up for first appointment.    Final next level of care: Home/Self Care Barriers to Discharge: Barriers Resolved   Patient Goals and CMS Choice Patient states their goals for this hospitalization and ongoing recovery are:: return home    Discharge Placement    Patient and family notified of of transfer: 07/16/24  Discharge Plan and Services Additional resources added to the After Visit Summary for         Social Drivers of Health (SDOH) Interventions SDOH Screenings   Food Insecurity: No Food Insecurity (07/14/2024)  Housing: Low Risk  (07/14/2024)  Transportation Needs: No Transportation Needs (07/14/2024)  Utilities: Not At Risk (07/14/2024)  Tobacco Use: High Risk (07/14/2024)     Readmission Risk Interventions    03/10/2024    5:12 PM  Readmission Risk Prevention Plan  Transportation Screening Complete  PCP or Specialist Appt within 5-7 Days --  Home Care Screening Complete  Medication Review (RN CM) Complete

## 2024-07-16 NOTE — Discharge Summary (Signed)
 Physician Discharge Summary  Gregory Murphy FMW:[id number] DOB: 06/27/1986 DOA: 07/14/2024  PCP: Patient, [name]  Admit date: 07/14/2024 Discharge date: 07/16/2024  Admitted From:  Home  Disposition: Home   Recommendations for Outpatient Follow-up:  Establish care with PCP in 2 weeks Establish care with GI office in 3 weeks Please go to outpatient alcohol  treatment program Please stop all alcohol  and recreational drug use Please check CBC/CMP in 1-2 weeks Outpatient PT was ordered   Discharge Condition: STABLE   CODE STATUS: FULL DIET: regular    Brief Hospitalization Summary: Please see all hospital notes, images, labs for full details of the hospitalization. Admission provider HPI:  Gregory Murphy is a 38 y.o. male with history of chronic alcohol  use/abuse with liver cirrhosis, pancytopenia, thrombocytopenia prior trauma to the leg reporting of worsening left knee pain and swelling.  Small boil on the lateral side of her knee which drained spontaneously no erythema edema. Presented to the ED for evaluation.  Noted for pancytopenia, thrombocytopenia Reports still drinks alcohol  but cut down quite a bit   ED evaluation: Blood pressure 121/65, pulse 66, temperature 98.2 F (36.8 C), resp. rate 17, SpO2 96%.  Labs: BMP normal, CBC  Left knee x-ray reviewed-positive for effusion, hardware in place, s/p   arthrocentesis.  - Revealing few WBCs, no indicative of infection, suspected spontaneous hemarthrosis   EDP Dr. Roselyn discussed with Ortho Dr. Onesimo, who recommended no surgical intervention.  Also discussed with hematology Dr. Rogers who recommended vitamin K, 1 unit platelet and 1U FFP transfusion.  Requested to be admitted for close observation rechecking CBC  Hospital Course by listed problems addressed  Severe thrombocytopenia - Acute on chronic pancytopenia, thrombocytopenia secondary to advanced liver cirrhosis secondary to chronic alcohol  use -  Case was discussed with Dr. Rogers - hematologist recommended to treat with vitamin K, 1 unit platelets, and 1U FFP - platelets have been stable for last few days, no bleeding complications - plan DC home today with outpatient follow up   Pancytopenia Due to chronic alcohol  abuse, liver cirrhosis -No active site of bleeding -Monitoring CBC closely, For thrombocytopenia hematologist recommended transfusion of 1 unit FFP, 1 unit platelets on 07/14/24 - labs have been stable - DC home today with outpatient follow up    Alcohol  abuse - Patient reports that he continues to drink but has cut down quite a bit in the past few weeks - Patient was advised to slowly cut down and quit soon as possible - CIWA protocol utilized, no acute alcohol  withdrawal  - pt was given outpatient treatment resources by [facility]     Left knee pain - Atraumatic left knee pain with swelling, healing blister laterally - he is s/p arthrocentesis in ED on 7/29 - today he is having increased pain and swelling, suspect is hemorrhagic/traumatic effusion - discussed with Dr Onesimo, orthopedics, he recommended supportive management only, no need for surgery or more imaging. . . Pt still will need close outpatient ortho follow up as he recommended yesterday - ambulatory referral made to [facility] office per patient's request    Left knee x-ray reviewed-positive for effusion, hardware in place, s/p   arthrocentesis.  - Revealing few WBCs, no indicative of infection, suspected spontaneous hemarthrosis   EDP Dr. Roselyn discussed with Ortho Dr. Onesimo in ED on 7/29, who recommended no surgical intervention.   - Will continue with cold compresses, minimal mobilization -Follow-up with orthopedic team as outpatient   HCV antibody positive - Chronic  follow-up as outpatient with GI for treatment - ambulatory referral made to [facility]    Alcoholic hepatitis with ascites - Monitor closely, no excessive fluid  buildup -Patient to follow-up with GI as an outpatient - ambulatory referral made to [facility]  With elevated LFTs - Need to monitor, avoid hepatotoxins       Latest Ref Rng & Units 07/14/2024    2:55 AM 03/25/2024    2:43 PM 03/20/2024    5:30 PM  Hepatic Function  Total Protein 6.5 - 8.1 g/dL 6.5  6.8  6.6   Albumin  3.5 - 5.0 g/dL 2.8  2.5  2.4   AST 15 - 41 U/L 244  91  97   ALT 0 - 44 U/L 74  32  37   Alk Phosphatase 38 - 126 U/L 130  74  65   Total Bilirubin 0.0 - 1.2 mg/dL 4.3  2.6  3.6   Bilirubin, Direct 0.0 - 0.2 mg/dL 2.0         Discharge Diagnoses:  Principal Problem:   Severe thrombocytopenia (HCC) Active Problems:   Open displaced fracture of distal phalanx of left thumb   Alcoholic hepatitis with ascites   Alcoholic cirrhosis of liver with ascites (HCC)   HCV antibody positive   Left knee pain   Alcohol  abuse   Pancytopenia (HCC)   Thrombocytopenia (HCC)   Discharge Instructions: Discharge Instructions     AMB referral to orthopedics   Complete by: As directed    Hospital follow up right knee hemorrhagic effusion   Ambulatory referral to Gastroenterology   Complete by: As directed    Hospital follow up alcoholic cirrhosis   What is the reason for referral?: Other   Ambulatory referral to Physical Therapy   Complete by: As directed       Allergies as of 07/16/2024       Reactions   Latex Itching        Medication List     STOP taking these medications    furosemide  40 MG tablet Commonly known as: LASIX    GoodSense Pain Relief Extra St 500 MG tablet Generic drug: acetaminophen    pantoprazole  40 MG tablet Commonly known as: PROTONIX    spironolactone  100 MG tablet Commonly known as: ALDACTONE        TAKE these medications    folic acid  1 MG tablet Commonly known as: FOLVITE  Take 1 tablet (1 mg total) by mouth daily. Start taking on: July 17, 2024   lactulose  10 GM/15ML solution Commonly known as: CHRONULAC  Take 15 mLs (10 g  total) by mouth 2 (two) times daily. WITH GOAL TO HAVE 3 BOWEL MOVEMENTS DAILY.   thiamine  100 MG tablet Commonly known as: Vitamin B-1 Take 1 tablet (100 mg total) by mouth daily. Start taking on: July 17, 2024   vitamin B-12 500 MCG tablet Commonly known as: CYANOCOBALAMIN  Take 500 mcg by mouth daily.               Durable Medical Equipment  (From admission, onward)           Start     Ordered   07/14/24 1237  For home use only DME Crutches  Once        07/14/24 1236            Follow-up Information     [facility] (Outpatient Treatment plan) Follow up.   Why: Walk in - (Mon- Fri) 5:30am-11AM Schedule appointment [phone] Contact information: [address]  [address] [place], [place]        [facility]. Schedule an appointment as soon as possible for a visit in 3 week(s).   Why: Hospital Follow Up Contact information: [address] [place]  [phone]        primary care provider. Schedule an appointment as soon as possible for a visit in 2 week(s).   Why: Hospital Follow Up               Allergies  Allergen Reactions   Latex Itching   Allergies as of 07/16/2024       Reactions   Latex Itching        Medication List     STOP taking these medications    furosemide  40 MG tablet Commonly known as: LASIX    GoodSense Pain Relief Extra St 500 MG tablet Generic drug: acetaminophen    pantoprazole  40 MG tablet Commonly known as: PROTONIX    spironolactone  100 MG tablet Commonly known as: ALDACTONE        TAKE these medications    folic acid  1 MG tablet Commonly known as: FOLVITE  Take 1 tablet (1 mg total) by mouth daily. Start taking on: July 17, 2024   lactulose  10 GM/15ML solution Commonly known as: CHRONULAC  Take 15 mLs (10 g total) by mouth 2 (two) times daily. WITH GOAL TO HAVE 3 BOWEL MOVEMENTS DAILY.   thiamine  100 MG tablet Commonly known as: Vitamin  B-1 Take 1 tablet (100 mg total) by mouth daily. Start taking on: July 17, 2024   vitamin B-12 500 MCG tablet Commonly known as: CYANOCOBALAMIN  Take 500 mcg by mouth daily.               Durable Medical Equipment  (From admission, onward)           Start     Ordered   07/14/24 1237  For home use only DME Crutches  Once        07/14/24 1236            Procedures/Studies: DG Knee Complete 4 Views Left Result Date: 07/14/2024 CLINICAL DATA:  Left knee pain and swelling EXAM: LEFT KNEE - COMPLETE 4+ VIEW COMPARISON:  None Available. FINDINGS: Status post distal left femoral ORIF normal alignment. No acute fracture or dislocation. Moderate left knee effusion. Soft tissues are otherwise unremarkable. IMPRESSION: 1. Moderate left knee effusion. No acute fracture or dislocation. Electronically Signed   By: Dorethia Molt M.D.   On: 07/14/2024 02:49     Subjective: Pt reports that his knee hurts but much better than yesterday and he is agreeable to follow up with [facility] office and agreeable to follow up with [facility] and finding a PCP.  He received alcohol  treatment information from [facility].   Discharge Exam: Vitals:   07/16/24 0526 07/16/24 0831  BP: 136/85 136/85  Pulse: 72 72  Resp: 18   Temp: 98.4 F (36.9 C)   SpO2: 97%    Vitals:   07/15/24 1313 07/15/24 2044 07/16/24 0526 07/16/24 0831  BP: (!) 132/96 121/76 136/85 136/85  Pulse: 91 85 72 72  Resp: 18 18 18    Temp: 98.1 F (36.7 C) 98.6 F (37 C) 98.4 F (36.9 C)   TempSrc:  Oral Oral   SpO2: 94% 97% 97%   Weight:      Height:       General: Pt is alert, awake, not in acute distress Cardiovascular: normal S1/S2 +, no rubs, no gallops Respiratory: CTA  bilaterally, no wheezing, no rhonchi Abdominal: Soft, NT, ND, bowel sounds + Extremities: left knee swollen, pain limited range of motion, no signs of infection.   The results of significant diagnostics from this hospitalization (including  imaging, microbiology, ancillary and laboratory) are listed below for reference.     Microbiology: Recent Results (from the past 240 hours)  Body fluid culture w Gram Stain     Status: None (Preliminary result)   Collection Time: 07/14/24  3:34 AM   Specimen: Synovium; Body Fluid  Result Value Ref Range Status   Specimen Description   Final    SYNOVIAL Performed at [facility], [address], [place], [place] [address]    Special Requests   Final    LEFT KNEE Performed at [facility], [address] [place], [place] [address]    Gram Stain   Final    FEW WBC PRESENT, PREDOMINANTLY MONONUCLEAR NO ORGANISMS SEEN Performed at [facility], [address] [place], [place] [address]    Culture   Final    NO GROWTH 1 DAY Performed at [facility], [address] [place], [place] [address]    Report Status PENDING  Incomplete     Labs: BNP (last 3 results) Recent Labs    03/10/24 0413  BNP 30.0   Basic Metabolic Panel: Recent Labs  Lab 07/14/24 0255 07/15/24 0446 07/16/24 0407  NA 137 133* 133*  K 3.8 4.0 4.3  CL 100 103 101  CO2 26 26 26   GLUCOSE 108* 82 98  BUN 6 7 9   CREATININE 0.58* 0.60* 0.70  CALCIUM  8.5* 8.2* 8.4*  MG 1.7  --   --   PHOS 3.6  --   --    Liver Function Tests: Recent Labs  Lab 07/14/24 0255 07/15/24 0446 07/16/24 0407  AST 244* 158* 126*  ALT 74* 53* 49*  ALKPHOS 130* 105 126  BILITOT 4.3* 4.2* 3.0*  PROT 6.5 5.8* 6.1*  ALBUMIN  2.8* 2.5* 2.7*   No results for input(s): LIPASE, AMYLASE in the last 168 hours. No results for input(s): AMMONIA in the last 168 hours. CBC: Recent Labs  Lab 07/14/24 0255 07/14/24 1421 07/14/24 1605 07/15/24 0446 07/16/24 0407  WBC 4.0 3.8* 3.9* 4.1 4.7  NEUTROABS 2.4  --   --   --   --   HGB 11.7* 10.0* 10.3* 10.7* 9.9*  HCT 33.4* 29.5* 29.5* 30.6* 28.9*  MCV 102.8* 103.9* 104.2* 104.1* 106.3*  PLT 32* 51* 50* 46* 41*   Cardiac Enzymes: No results for input(s): CKTOTAL,  CKMB, CKMBINDEX, TROPONINI in the last 168 hours. BNP: Invalid input(s): POCBNP CBG: No results for input(s): GLUCAP in the last 168 hours. D-Dimer Recent Labs    07/14/24 0651  DDIMER 2.82*   Hgb A1c No results for input(s): HGBA1C in the last 72 hours. Lipid Profile No results for input(s): CHOL, HDL, LDLCALC, TRIG, CHOLHDL, LDLDIRECT in the last 72 hours. Thyroid function studies No results for input(s): TSH, T4TOTAL, T3FREE, THYROIDAB in the last 72 hours.  Invalid input(s): FREET3 Anemia work up No results for input(s): VITAMINB12, FOLATE, FERRITIN, TIBC, IRON, RETICCTPCT in the last 72 hours. Urinalysis    Component Value Date/Time   COLORURINE AMBER (A) 03/10/2024 0426   APPEARANCEUR CLEAR 03/10/2024 0426   LABSPEC 1.030 03/10/2024 0426   PHURINE 5.0 03/10/2024 0426   GLUCOSEU NEGATIVE 03/10/2024 0426   HGBUR NEGATIVE 03/10/2024 0426   BILIRUBINUR SMALL (A) 03/10/2024 0426   KETONESUR NEGATIVE 03/10/2024 0426  PROTEINUR 30 (A) 03/10/2024 0426   NITRITE NEGATIVE 03/10/2024 0426   LEUKOCYTESUR NEGATIVE 03/10/2024 0426   Sepsis Labs Recent Labs  Lab 07/14/24 1421 07/14/24 1605 07/15/24 0446 07/16/24 0407  WBC 3.8* 3.9* 4.1 4.7   Microbiology Recent Results (from the past 240 hours)  Body fluid culture w Gram Stain     Status: None (Preliminary result)   Collection Time: 07/14/24  3:34 AM   Specimen: Synovium; Body Fluid  Result Value Ref Range Status   Specimen Description   Final    SYNOVIAL Performed at [facility], [address] [place], [place] [address]    Special Requests   Final    LEFT KNEE Performed at [facility], [address] [place], [place] [address]    Gram Stain   Final    FEW WBC PRESENT, PREDOMINANTLY MONONUCLEAR NO ORGANISMS SEEN Performed at [facility], [address], [place], [place] [address]    Culture   Final    NO GROWTH 1 DAY Performed at [facility], [address], [place], [place] [address]    Report Status PENDING  Incomplete   Time coordinating discharge: 33 mins   SIGNED:  Afton Louder, MD  [facility] 07/16/2024, 12:09 PM How to contact the [facility] Attending or Consulting provider 7A - 7P or covering provider during after hours 7P -7A, for this patient?  Check the care team in [facility] and look for a) attending/consulting [facility] provider listed and b) the [facility] team listed Log into [email or web address] and use [facility]'s universal password to access. If you do not have the password, please contact the hospital operator. Locate the [facility] provider you are looking for under [facility] and page to a number that you can be directly reached. If you still have difficulty reaching the provider, please page the [facility] (Director on Call) for the Hospitalists listed on amion for assistance.

## 2024-07-17 LAB — BODY FLUID CULTURE W GRAM STAIN: Culture: NO GROWTH

## 2024-08-12 ENCOUNTER — Ambulatory Visit: Payer: MEDICAID | Admitting: Gastroenterology

## 2024-10-08 ENCOUNTER — Other Ambulatory Visit: Payer: Self-pay

## 2024-10-08 ENCOUNTER — Emergency Department (HOSPITAL_COMMUNITY): Payer: MEDICAID

## 2024-10-08 ENCOUNTER — Encounter (HOSPITAL_COMMUNITY): Payer: Self-pay

## 2024-10-08 ENCOUNTER — Emergency Department (HOSPITAL_COMMUNITY)
Admission: EM | Admit: 2024-10-08 | Discharge: 2024-10-09 | Disposition: A | Discharge status: Home / Self Care | Payer: MEDICAID | Attending: Emergency Medicine | Admitting: Emergency Medicine

## 2024-10-08 DIAGNOSIS — Y908 Blood alcohol level of 240 mg/100 ml or more: Secondary | ICD-10-CM | POA: Diagnosis not present

## 2024-10-08 DIAGNOSIS — D696 Thrombocytopenia, unspecified: Secondary | ICD-10-CM | POA: Diagnosis present

## 2024-10-08 DIAGNOSIS — Z91040 Latex allergy status: Secondary | ICD-10-CM | POA: Diagnosis not present

## 2024-10-08 DIAGNOSIS — F1010 Alcohol abuse, uncomplicated: Secondary | ICD-10-CM

## 2024-10-08 DIAGNOSIS — R822 Biliuria: Secondary | ICD-10-CM | POA: Diagnosis not present

## 2024-10-08 DIAGNOSIS — F10120 Alcohol abuse with intoxication, uncomplicated: Secondary | ICD-10-CM | POA: Insufficient documentation

## 2024-10-08 DIAGNOSIS — R1084 Generalized abdominal pain: Secondary | ICD-10-CM | POA: Diagnosis not present

## 2024-10-08 LAB — COMPREHENSIVE METABOLIC PANEL WITH GFR
ALT: 65 U/L — ABNORMAL HIGH (ref 0–44)
AST: 206 U/L — ABNORMAL HIGH (ref 15–41)
Albumin: 3.5 g/dL (ref 3.5–5.0)
Alkaline Phosphatase: 151 U/L — ABNORMAL HIGH (ref 38–126)
Anion gap: 10 (ref 5–15)
BUN: <5 mg/dL — ABNORMAL LOW (ref 6–20)
CO2: 26 mmol/L (ref 22–32)
Calcium: 8.1 mg/dL — ABNORMAL LOW (ref 8.9–10.3)
Chloride: 105 mmol/L (ref 98–111)
Creatinine, Ser: 0.78 mg/dL (ref 0.61–1.24)
GFR, Estimated: >60 mL/min (ref >60)
Glucose, Bld: 141 mg/dL — ABNORMAL HIGH (ref 70–99)
Potassium: 3.7 mmol/L (ref 3.5–5.1)
Sodium: 141 mmol/L (ref 135–145)
Total Bilirubin: 3 mg/dL — ABNORMAL HIGH (ref 0.0–1.2)
Total Protein: 7.3 g/dL (ref 6.5–8.1)

## 2024-10-08 LAB — URINALYSIS, ROUTINE W REFLEX MICROSCOPIC
Bacteria, UA: NONE SEEN
Glucose, UA: >=500 mg/dL — AB
Hgb urine dipstick: NEGATIVE
Ketones, ur: NEGATIVE mg/dL
Leukocytes,Ua: NEGATIVE
Nitrite: NEGATIVE
Protein, ur: NEGATIVE mg/dL
Specific Gravity, Urine: 1.015 (ref 1.005–1.030)
pH: 7 (ref 5.0–8.0)

## 2024-10-08 LAB — URINE DRUG SCREEN
Amphetamines: NEGATIVE
Barbiturates: NEGATIVE
Benzodiazepines: NEGATIVE
Cocaine: NEGATIVE
Fentanyl: NEGATIVE
Methadone Scn, Ur: NEGATIVE
Opiates: NEGATIVE
Tetrahydrocannabinol: NEGATIVE

## 2024-10-08 LAB — CK: Total CK: 366 U/L (ref 49–397)

## 2024-10-08 LAB — ACETAMINOPHEN LEVEL: Acetaminophen (Tylenol), Serum: <10 ug/mL — ABNORMAL LOW (ref 10–30)

## 2024-10-08 LAB — SALICYLATE LEVEL: Salicylate Lvl: <7 mg/dL — ABNORMAL LOW (ref 7.0–30.0)

## 2024-10-08 LAB — ETHANOL: Alcohol, Ethyl (B): 390 mg/dL (ref <15)

## 2024-10-08 MED ORDER — THIAMINE MONONITRATE 100 MG PO TABS
100.0000 mg | ORAL_TABLET | Freq: Every day | ORAL | Status: DC
  Filled 2024-10-08: qty 1

## 2024-10-08 MED ORDER — CHLORDIAZEPOXIDE HCL 5 MG PO CAPS
10.0000 mg | ORAL_CAPSULE | Freq: Four times a day (QID) | ORAL | Status: DC
  Administered 2024-10-08: 10 mg via ORAL
  Filled 2024-10-08: qty 2

## 2024-10-08 MED ORDER — THIAMINE HCL 100 MG/ML IJ SOLN
100.0000 mg | Freq: Every day | INTRAMUSCULAR | Status: DC
  Administered 2024-10-08: 100 mg via INTRAVENOUS
  Filled 2024-10-08: qty 2

## 2024-10-08 MED ORDER — LORAZEPAM 1 MG PO TABS
1.0000 mg | ORAL_TABLET | Freq: Once | ORAL | Status: AC
  Administered 2024-10-08: 1 mg via ORAL
  Filled 2024-10-08: qty 1

## 2024-10-08 MED ORDER — LORAZEPAM 1 MG PO TABS: 1.0000 mg | ORAL_TABLET | ORAL | Status: DC | PRN

## 2024-10-08 MED ORDER — VITAMIN K1 10 MG/ML IJ SOLN
10.0000 mg | Freq: Once | INTRAVENOUS | Status: AC
  Administered 2024-10-08: 10 mg via INTRAVENOUS
  Filled 2024-10-08: qty 1

## 2024-10-08 MED ORDER — ADULT MULTIVITAMIN W/MINERALS CH
1.0000 | ORAL_TABLET | Freq: Every day | ORAL | Status: DC
  Administered 2024-10-08: 1 via ORAL
  Filled 2024-10-08: qty 1

## 2024-10-08 MED ORDER — LORAZEPAM 2 MG/ML IJ SOLN: 1.0000 mg | INTRAMUSCULAR | Status: DC | PRN

## 2024-10-08 MED ORDER — FOLIC ACID 1 MG PO TABS
1.0000 mg | ORAL_TABLET | Freq: Every day | ORAL | Status: DC
  Administered 2024-10-08: 1 mg via ORAL
  Filled 2024-10-08: qty 1

## 2024-10-08 MED ORDER — POTASSIUM CHLORIDE CRYS ER 20 MEQ PO TBCR
40.0000 meq | EXTENDED_RELEASE_TABLET | Freq: Once | ORAL | Status: AC
  Administered 2024-10-08: 40 meq via ORAL
  Filled 2024-10-08: qty 2

## 2024-10-08 MED ORDER — IOHEXOL 300 MG/ML  SOLN
100.0000 mL | Freq: Once | INTRAMUSCULAR | Status: AC | PRN
  Administered 2024-10-08: 100 mL via INTRAVENOUS

## 2024-10-08 NOTE — ED Notes (Signed)
 Pt was resting but easily woken.  No changes since starting medications.

## 2024-10-08 NOTE — ED Notes (Signed)
 Assumed care of pt, found him alert in bed eating the rest of my dinner.  Pt was alert and oriented.

## 2024-10-08 NOTE — Consult Note (Signed)
 Patient Demographics  Gregory Murphy, is a 38 y.o. male   MRN: [id number]   DOB - [birth year]  Admit Date - 10/08/2024    Outpatient Primary MD for the patient is Patient, [name]  Consult requested in the Hospital by Towana Ozell BROCKS, MD, On 10/08/2024    Reason for consult : ED requesting admission while awaiting repeat CT head   With History of -  Past Medical History:  Diagnosis Date   Alcohol  withdrawal (HCC) 03/01/2024   Alcoholic cirrhosis of liver with ascites (HCC) 03/02/2024   Alcoholic gastritis 03/03/2024   Alcoholic hepatitis with ascites (HCC) 03/02/2024   Brain injury (HCC)    Cirrhosis with alcoholism (HCC)    Critical polytrauma 12/17/2021   Decompensated cirrhosis (HCC) 03/10/2024   Depression    Elevated AFP 03/03/2024   HCV antibody positive 03/03/2024   Insomnia    Macrocytic anemia 03/03/2024   Malnutrition of moderate degree 12/21/2021   Panic attack    Severe thrombocytopenia 03/03/2024   Splenic rupture 09/07/2023      Past Surgical History:  Procedure Laterality Date   ABDOMINAL SURGERY     CLOSED REDUCTION FINGER WITH PERCUTANEOUS PINNING Left 02/04/2024   Procedure: LEFT THUMB CLOSED REDUCTION FINGER WITH PERCUTANEOUS PINNing;  Surgeon: Arlinda Buster, MD;  Location: [facility];  Service: Orthopedics;  Laterality: Left;   DISTAL FEMUR BONE BRIDGE EXCISION Left    FEMUR CLOSED REDUCTION Left 12/17/2021   Procedure: CLOSED REDUCTION FEMORAL SHAFT;  Surgeon: Barton Drape, MD;  Location: [facility];  Service: Orthopedics;  Laterality: Left;   FOREARM SURGERY Left    x4-metal rods placed   I & D EXTREMITY Left 02/04/2024   Procedure: IRRIGATION AND DEBRIDEMENT OF LEFT THUMB;  Surgeon: Arlinda Buster, MD;  Location: [facility];  Service: Orthopedics;  Laterality: Left;   IR PARACENTESIS  03/02/2024   LEG SURGERY Right  2023   NAILBED REPAIR Left 02/04/2024   Procedure: LEFT THUMB NAILBED REPAIR;  Surgeon: Arlinda Buster, MD;  Location: [facility];  Service: Orthopedics;  Laterality: Left;   ORIF FEMUR FRACTURE Left 12/20/2021   Procedure: OPEN REDUCTION INTERNAL FIXATION (ORIF) DISTAL FEMUR FRACTURE;  Surgeon: Kendal Franky SQUIBB, MD;  Location: [facility];  Service: Orthopedics;  Laterality: Left;   TIBIA IM NAIL INSERTION Bilateral 12/17/2021   Procedure: RIGHT INTRAMEDULLARY (IM) NAIL TIBIAL; RIGHT CLOSED TREATMENT OF FIBULA FRACTURE; LEFT CLOSED REDUCTION SPLINTING OF PERIPROSTHETIC  SUPRACONDYLAR FEMUR FRACTURE;  Surgeon: Barton Drape, MD;  Location: [facility];  Service: Orthopedics;  Laterality: Bilateral;   WRIST ARTHROSCOPY WITH CARPOMETACARPEL [facility]) ARTHROPLASTY Left 03/21/2022   Procedure: left wrist arthroscopy with debridement as needed;  Surgeon: Sissy Cough, MD;  Location:  SURGERY CENTER;  Service: Orthopedics;  Laterality: Left;  just needs 60 mins for surgery   WRIST SURGERY Right    pins    in for   Chief Complaint  Patient presents with   Medical Clearance  HPI  Gregory Murphy  is a 38 y.o. male, with history of chronic alcohol  use/abuse with liver cirrhosis, pancytopenia, thrombocytopenia, with recent hospitalization last July for thrombocytopenia, alcohol  abuse and alcoholic hepatitis with ascites, patient presents to ED for evaluation of dark-colored urine, reported has started few days ago, but he was trying to get into [facility] for alcohol  recovery, and they turned him away asking for medical clearance, patient himself denies any complaints currently, reports last drink was this morning, he denies any urinary problem, no fever, no chills, no flank pain, no altered mental status, no nausea, no vomiting, with history of multiple events of alcohol  intoxication, he drinks 5-6 beers per day, nightly patient with history of falls while intoxicated, so CT head was  obtained, but given he received IV contrast prior to CT head, it was a slightly hyperdense thickened appearance in the region of dural venous sinuses likely related to intravenous contrast, so repeat CT head was recommended in 12 hours, and [facility] consulted to admit while awaiting repeat CT head.    Review of Systems      A full 10 point Review of Systems was done, except as stated above, all other Review of Systems were negative.   Social History Social History   Tobacco Use   Smoking status: Every Day    Current packs/day: 0.50    Average packs/day: 0.5 packs/day for 14.0 years (7.0 ttl pk-yrs)    Types: Cigarettes    Passive exposure: Current   Smokeless tobacco: Never  Substance Use Topics   Alcohol  use: Yes    Alcohol /week: 21.0 standard drinks of alcohol     Types: 21 Cans of beer per week    Comment: previously 4-5 beers daily but now pregame one daily (equivalent of four shots daily) as of 03/23/24     Family History History reviewed. No pertinent family history.   Prior to Admission medications   Medication Sig Start Date End Date Taking? Authorizing Provider  thiamine  (VITAMIN B-1) 100 MG tablet Take 1 tablet (100 mg total) by mouth daily. 07/17/24  Yes Johnson, Clanford L, MD  vitamin B-12 (CYANOCOBALAMIN ) 500 MCG tablet Take 500 mcg by mouth daily.   Yes [provider]    Anti-infectives (From admission, onward)    None       Scheduled Meds:  chlordiazePOXIDE   10 mg Oral QID   folic acid   1 mg Oral Daily   multivitamin with minerals  1 tablet Oral Daily   thiamine   100 mg Oral Daily   Or   thiamine   100 mg Intravenous Daily   Continuous Infusions:  phytonadione  (VITAMIN K) 10 mg in dextrose  5 % 50 mL IVPB 10 mg (10/08/24 2048)   PRN Meds:.LORazepam  **OR** LORazepam   Allergies  Allergen Reactions   Latex Itching    Gloves     Physical Exam  Vitals  Blood pressure 128/77, pulse 91, temperature 97.8 F (36.6 C),  temperature source Oral, resp. rate 16, height 5' 7 (1.702 m), weight 58.1 kg, SpO2 94%.   1. General Thin appearing male, laying in bed, no apparent distress  2. Normal affect and insight, Not Suicidal or Homicidal, Awake Alert, Oriented X 3.  3. No F.N deficits, ALL C.Nerves Intact, Strength 5/5 all 4 extremities, Sensation intact all 4 extremities, Plantars down going.  4. Ears and Eyes appear Normal, Conjunctivae clear, PERRLA. Moist Oral Mucosa.  5. Supple Neck, No JVD, No cervical lymphadenopathy appriciated, No Carotid Bruits.  6. Symmetrical Chest  wall movement, Good air movement bilaterally, CTAB.  7. RRR, No Gallops, Rubs or Murmurs, No Parasternal Heave.  8. Positive Bowel Sounds, Abdomen Soft, No tenderness, No organomegaly appriciated,No rebound -guarding or rigidity.  9.  No Cyanosis, Normal Skin Turgor, No Skin Rash or Bruise.  10.   joints appear normal , no effusions, Normal ROM.    Data Review  CBC Recent Labs  Lab 10/08/24 1213  WBC 3.2*  HGB 11.5*  HCT 33.6*  PLT 21*  MCV 106.7*  MCH 36.5*  MCHC 34.2  RDW 14.4  LYMPHSABS 1.4  MONOABS 0.3  EOSABS 0.1  BASOSABS 0.1   ------------------------------------------------------------------------------------------------------------------  Chemistries  Recent Labs  Lab 10/08/24 1213  NA 141  K 3.7  CL 105  CO2 26  GLUCOSE 141*  BUN <5*  CREATININE 0.78  CALCIUM  8.1*  AST 206*  ALT 65*  ALKPHOS 151*  BILITOT 3.0*   ------------------------------------------------------------------------------------------------------------------ estimated creatinine clearance is 102.9 mL/min (by C-G formula based on SCr of 0.78 mg/dL). ------------------------------------------------------------------------------------------------------------------ No results for input(s): TSH, T4TOTAL, T3FREE, THYROIDAB in the last 72 hours.  Invalid input(s): FREET3   Coagulation profile No results for  input(s): INR, PROTIME in the last 168 hours. ------------------------------------------------------------------------------------------------------------------- No results for input(s): DDIMER in the last 72 hours. -------------------------------------------------------------------------------------------------------------------  Cardiac Enzymes No results for input(s): CKMB, TROPONINI, MYOGLOBIN in the last 168 hours.  Invalid input(s): CK ------------------------------------------------------------------------------------------------------------------ Invalid input(s): POCBNP   ---------------------------------------------------------------------------------------------------------------  Urinalysis    Component Value Date/Time   COLORURINE AMBER (A) 10/08/2024 1223   APPEARANCEUR CLEAR 10/08/2024 1223   LABSPEC 1.015 10/08/2024 1223   PHURINE 7.0 10/08/2024 1223   GLUCOSEU >=500 (A) 10/08/2024 1223   HGBUR NEGATIVE 10/08/2024 1223   BILIRUBINUR SMALL (A) 10/08/2024 1223   KETONESUR NEGATIVE 10/08/2024 1223   PROTEINUR NEGATIVE 10/08/2024 1223   NITRITE NEGATIVE 10/08/2024 1223   LEUKOCYTESUR NEGATIVE 10/08/2024 1223     Imaging results:   CT Head Wo Contrast Addendum Date: 10/08/2024 ADDENDUM REPORT: 10/08/2024 19:05 ADDENDUM: Additional clinical information obtained. Patient has history of fall with thrombocytopenia. Slightly hyperdense thickened appearance in the region of the dural venous sinuses is felt likely related to previously administered intravenous contrast but as noted, head CT is limited for assessment of dense vessels and hemorrhage in the presence of intravenous contrast. If clinical suspicion remains high, repeat head CT in 12-24 hours could be obtained. Electronically Signed   By: Luke Bun M.D.   On: 10/08/2024 19:05   Result Date: 10/08/2024 CLINICAL DATA:  Substance abuse abnormal urine assessment EXAM: CT HEAD WITHOUT CONTRAST CT  CERVICAL SPINE WITHOUT CONTRAST TECHNIQUE: Multidetector CT imaging of the head and cervical spine was performed following the standard protocol without intravenous contrast. Multiplanar CT image reconstructions of the cervical spine were also generated. RADIATION DOSE REDUCTION: This exam was performed according to the departmental dose-optimization program which includes automated exposure control, adjustment of the mA and/or kV according to patient size and/or use of iterative reconstruction technique. COMPARISON:  CT brain and cervical spine 09/07/2023 FINDINGS: CT HEAD FINDINGS Brain: No acute territorial infarction, hemorrhage or intracranial mass. The ventricles are nonenlarged. Vascular: Limited evaluation due to presence of previously administered contrast. No unexpected calcification Skull: Normal. Negative for fracture or focal lesion. Sinuses/Orbits: No acute finding. Other: None CT CERVICAL SPINE FINDINGS Alignment: No subluxation.  Facet alignment is normal Skull base and vertebrae: No acute fracture. No primary bone lesion or focal pathologic process. Soft tissues and spinal canal: No prevertebral fluid or swelling.  No visible canal hematoma. Disc levels: Relatively patent disc spaces. Mild multilevel foraminal narrowing. Upper chest: Emphysema Other: None IMPRESSION: 1. Head CT examination limited by previously administered contrast medium. Allowing for this, no CT evidence for acute intracranial abnormality. 2. No acute osseous abnormality of the cervical spine. 3. Emphysema. Emphysema (ICD10-J43.9). Electronically Signed: By: Luke Bun M.D. On: 10/08/2024 18:40   CT Cervical Spine Wo Contrast Addendum Date: 10/08/2024 ADDENDUM REPORT: 10/08/2024 19:05 ADDENDUM: Additional clinical information obtained. Patient has history of fall with thrombocytopenia. Slightly hyperdense thickened appearance in the region of the dural venous sinuses is felt likely related to previously administered  intravenous contrast but as noted, head CT is limited for assessment of dense vessels and hemorrhage in the presence of intravenous contrast. If clinical suspicion remains high, repeat head CT in 12-24 hours could be obtained. Electronically Signed   By: Luke Bun M.D.   On: 10/08/2024 19:05   Result Date: 10/08/2024 CLINICAL DATA:  Substance abuse abnormal urine assessment EXAM: CT HEAD WITHOUT CONTRAST CT CERVICAL SPINE WITHOUT CONTRAST TECHNIQUE: Multidetector CT imaging of the head and cervical spine was performed following the standard protocol without intravenous contrast. Multiplanar CT image reconstructions of the cervical spine were also generated. RADIATION DOSE REDUCTION: This exam was performed according to the departmental dose-optimization program which includes automated exposure control, adjustment of the mA and/or kV according to patient size and/or use of iterative reconstruction technique. COMPARISON:  CT brain and cervical spine 09/07/2023 FINDINGS: CT HEAD FINDINGS Brain: No acute territorial infarction, hemorrhage or intracranial mass. The ventricles are nonenlarged. Vascular: Limited evaluation due to presence of previously administered contrast. No unexpected calcification Skull: Normal. Negative for fracture or focal lesion. Sinuses/Orbits: No acute finding. Other: None CT CERVICAL SPINE FINDINGS Alignment: No subluxation.  Facet alignment is normal Skull base and vertebrae: No acute fracture. No primary bone lesion or focal pathologic process. Soft tissues and spinal canal: No prevertebral fluid or swelling. No visible canal hematoma. Disc levels: Relatively patent disc spaces. Mild multilevel foraminal narrowing. Upper chest: Emphysema Other: None IMPRESSION: 1. Head CT examination limited by previously administered contrast medium. Allowing for this, no CT evidence for acute intracranial abnormality. 2. No acute osseous abnormality of the cervical spine. 3. Emphysema. Emphysema  (ICD10-J43.9). Electronically Signed: By: Luke Bun M.D. On: 10/08/2024 18:40   US  Abdomen Limited RUQ (LIVER/GB) Result Date: 10/08/2024 CLINICAL DATA:  Abdominal pain EXAM: ULTRASOUND ABDOMEN LIMITED RIGHT UPPER QUADRANT COMPARISON:  10/08/2024, 03/11/2024 FINDINGS: Gallbladder: Gallbladder is moderately distended, with multiple small shadowing gallstones layering dependently, as seen on preceding CT exam. No gallbladder wall thickening or pericholecystic fluid. Negative sonographic Murphy sign. Common bile duct: Diameter: 5 mm Liver: Coarsened increased liver echotexture and nodularity of the hepatic capsule consistent with cirrhosis. No focal parenchymal abnormality. Portal vein is patent on color Doppler imaging with normal direction of blood flow towards the liver. Other: None. IMPRESSION: 1. Cholelithiasis, with no sonographic evidence of acute cholecystitis. If acute cholecystitis remains a clinical concern, nuclear medicine hepatobiliary scan could be considered. 2. Cirrhosis. Electronically Signed   By: Ozell Daring M.D.   On: 10/08/2024 16:42   CT ABDOMEN PELVIS W CONTRAST Result Date: 10/08/2024 CLINICAL DATA:  Abdominal pain. EXAM: CT ABDOMEN AND PELVIS WITH CONTRAST TECHNIQUE: Multidetector CT imaging of the abdomen and pelvis was performed using the standard protocol following bolus administration of intravenous contrast. RADIATION DOSE REDUCTION: This exam was performed according to the departmental dose-optimization program which includes automated exposure control, adjustment  of the mA and/or kV according to patient size and/or use of iterative reconstruction technique. CONTRAST:  OMNIPAQUE  IOHEXOL  300 MG/ML  SOLN COMPARISON:  CT abdomen pelvis dated 03/10/2024. FINDINGS: Lower chest: The visualized lung bases are clear. There is coronary vascular calcification. No intra-abdominal free air.  No free fluid. Hepatobiliary: Cirrhosis. No biliary dilatation. Multiple gallstones.  Mild thickened appearance of the gallbladder wall may be related to underlying liver disease. Ultrasound may provide better evaluation if there is clinical concern for acute cholecystitis. Pancreas: Unremarkable. No pancreatic ductal dilatation or surrounding inflammatory changes. Spleen: Normal in size without focal abnormality. Adrenals/Urinary Tract: The adrenal glands are unremarkable. The kidneys, visualized ureters, and urinary bladder appear unremarkable. Stomach/Bowel: There is no bowel obstruction or active inflammation. The appendix is normal. Vascular/Lymphatic: Mild aortoiliac atherosclerotic disease. The IVC is unremarkable. No portal venous gas. There is no adenopathy. Dilated left splenorenal shunt. Paraesophageal varices. Reproductive: The prostate and seminal vesicles are grossly remarkable. Other: None Musculoskeletal: No acute osseous pathology. Left femoral intramedullary fixation hardware. IMPRESSION: 1. No acute intra-abdominal or pelvic pathology. 2. Cirrhosis with evidence of portal hypertension. 3. Cholelithiasis. 4. No bowel obstruction. Normal appendix. 5.  Aortic Atherosclerosis (ICD10-I70.0). Electronically Signed   By: Vanetta Chou M.D.   On: 10/08/2024 15:01     Assessment & Plan  Active Problems:   Bilirubinuria   Bilirubinuria -Patient main presentation is due to dark urine, concerned about hematuria, but actually this is related to bilirubinuria from his hyperbilirubinemia in the setting of his liver disease from alcohol  abuse .  Fall - Given his alcoholic use and thrombocytopenia, CT head was obtained to rule out any head trauma, but was inconclusive given recent administration of IV contrast, radiology recommend repeat imaging in 12 hours to allow time for IV contrast washout . - Plan to repeat imaging 6 AM per radiology recommendation. - At this point given no evidence of withdrawals, and no acute intervention needed, patient can await ED to repeat his CT head  at 6 a.m. to rule out ICH.  Thrombocytopenia Alcoholic liver cirrhosis with ascites Hyperbilirubinemia Pancytopenia Transaminitis - Patient with significant lab abnormalities, but actually labs appear to be at baseline, his alcoholic liver disease appears to be compensated, no evidence of ascites, and no acute intervention is needed, is all related to his liver disease from severe alcohol  use consumption, at this point there is no active intervention other than total alcohol  abstinence , I have discussed this at length with the patient. - platelet count of 21K, even though lower than his baseline, but given lack of any evidence of bleed there is no indication to transfuse.  Alcohol  abuse/alcohol  intoxication -With history of alcohol  abuse, most recently this morning -Will need to await 12 hours prior to repeat CT head without any brain bleed, though he was kept on CIWA protocol for now, will add low-dose Librium  to prevent withdrawals.       Thank you for the consult, we will follow the patient with you in the Hospital.   Brayton Lye M.D on 10/08/2024 at 9:19 PM   Thank you for the consult, we will follow the patient with you in the Hospital.   [facility]   Office  [phone]

## 2024-10-08 NOTE — ED Triage Notes (Signed)
 Pt reports his last drink of alcohol  was this morning. Pt reports he drinks a lot, about 12% Beers, 5-6 day.
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
## 2024-10-08 NOTE — ED Provider Notes (Signed)
 [place] EMERGENCY DEPARTMENT AT [facility] Provider Note   CSN: [id number] Arrival date & time: 10/08/24  1135     Patient presents with: Medical Clearance   Gregory Murphy is a 38 y.o. male with a history of chronic alcohol  abuse, decompensated cirrhosis, presets to the ED with dark urine that began few days ago.  The patient states that he is trying to get into [facility] for alcohol  recovery, and the facility has turned him away due to his dark urine until he is able to get medical clearance.  When asked what the facility needed for medical clearance he stated it does not really matter because you guys are just going to admit me anyway.  States that he currently does not have any symptoms such as problems urinating, burning, fevers, or flank pain.  Patient states that he always has abdominal pain that comes and goes, however right now he does not have any abdominal pain.  He denies nausea, vomiting, diarrhea, blood in emesis or blood in stool.  Patient states that he has a history of syncopal episodes when he drinks however denies any recent LOC or trauma such as hitting his head.  Patient states that he does not currently have any neurological symptoms such as seizures, numbness, tingling, visual changes.  States that he currently drinks 5-6 12% beers a day and today he has had approximately 2 beers 12%. He states that he has had no recent travel. No sick contacts. [name] social history is notable for chronic alcohol  abuse.  A few hours into patient's visit, patient's family showed up and relayed that patient has had repeat falls over the last week and has hit his head upon at least one of the falls.    HPI     Prior to Admission medications   Medication Sig Start Date End Date Taking? Authorizing Provider  thiamine  (VITAMIN B-1) 100 MG tablet Take 1 tablet (100 mg total) by mouth daily. 07/17/24  Yes Johnson, Clanford L, MD  vitamin B-12 (CYANOCOBALAMIN ) 500 MCG  tablet Take 500 mcg by mouth daily.   Yes [provider]    Allergies: Latex    Review of Systems  Genitourinary:        Urine dark in color        10/08/2024    8:48 PM 10/08/2024    8:45 PM 10/08/2024    8:30 PM  Vitals with BMI  Systolic  128 130  Diastolic  77 84  Pulse 91 99 72     Physical Exam Vitals and nursing note reviewed.  Constitutional:      General: He is not in acute distress.    Appearance: Normal appearance.  HENT:     Head: Normocephalic and atraumatic.     Comments: No trauma noted to the head.  No masses, abrasion, contusion, ecchymosis. Eyes:     General: Gaze aligned appropriately. Scleral icterus present. No visual field deficit.    Extraocular Movements: Extraocular movements intact.     Conjunctiva/sclera: Conjunctivae normal.     Pupils: Pupils are equal, round, and reactive to light.     Visual Fields: Right eye visual fields normal and left eye visual fields normal.     Comments: Patient has mild scleral icterus on exam.  Cardiovascular:     Rate and Rhythm: Normal rate and regular rhythm.     Pulses: Normal pulses.          Radial pulses are 2+ on  the right side.  Pulmonary:     Effort: Pulmonary effort is normal. No respiratory distress.     Breath sounds: Normal breath sounds.     Comments: Patient has no difficulty speaking in complete sentences. Abdominal:     General: Abdomen is flat.     Palpations: Abdomen is soft.     Tenderness: There is generalized abdominal tenderness. Negative signs include Murphy's sign and McBurney's sign.     Comments: Generalized tenderness to entire abdomen on exam.  No CVA tenderness, no guarding, no rebound tenderness.  Patient has no distention.  No ecchymosis noted to abdomen.  Musculoskeletal:        General: Normal range of motion.     Cervical back: Normal range of motion.     Right lower leg: No edema.     Left lower leg: No edema.  Skin:    General: Skin is warm and dry.      Capillary Refill: Capillary refill takes less than 2 seconds.     Comments: Patient does not appear jaundiced on exam.  Neurological:     General: No focal deficit present.     Mental Status: He is alert. Mental status is at baseline.     GCS: GCS eye subscore is 4. GCS verbal subscore is 5. GCS motor subscore is 6.     Cranial Nerves: Cranial nerves 2-12 are intact.     Sensory: Sensation is intact.     Motor: Motor function is intact.     Coordination: Coordination is intact.     Deep Tendon Reflexes:     Reflex Scores:      Bicep reflexes are 2+ on the right side and 2+ on the left side.      Patellar reflexes are 2+ on the right side and 2+ on the left side.    Comments: CN III through XII intact No deficits No weakness, no tremors, no seizure activity Finger-to-nose normal  Psychiatric:        Mood and Affect: Mood normal.        Speech: Speech normal.        Behavior: Behavior is cooperative.        Cognition and Memory: Memory is not impaired.     Comments: Patient had some impaired memory on exam, unable to answer some questions regarding medical history, possibly due to current EtOH     (all labs ordered are listed, but only abnormal results are displayed) Labs Reviewed  COMPREHENSIVE METABOLIC PANEL WITH GFR - Abnormal; Notable for the following components:      Result Value   Glucose, Bld 141 (*)    BUN <5 (*)    Calcium  8.1 (*)    AST 206 (*)    ALT 65 (*)    Alkaline Phosphatase 151 (*)    Total Bilirubin 3.0 (*)    All other components within normal limits  ETHANOL - Abnormal; Notable for the following components:   Alcohol , Ethyl (B) 390 (*)    All other components within normal limits  CBC WITH DIFFERENTIAL/PLATELET - Abnormal; Notable for the following components:   WBC 3.2 (*)    RBC 3.15 (*)    Hemoglobin 11.5 (*)    HCT 33.6 (*)    MCV 106.7 (*)    MCH 36.5 (*)    Platelets 21 (*)    Neutro Abs 1.4 (*)    All other components within normal limits   URINALYSIS, ROUTINE W REFLEX  MICROSCOPIC - Abnormal; Notable for the following components:   Color, Urine AMBER (*)    Glucose, UA >=500 (*)    Bilirubin Urine SMALL (*)    All other components within normal limits  ACETAMINOPHEN  LEVEL - Abnormal; Notable for the following components:   Acetaminophen  (Tylenol ), Serum <10 (*)    All other components within normal limits  SALICYLATE LEVEL - Abnormal; Notable for the following components:   Salicylate Lvl <7.0 (*)    All other components within normal limits  URINE DRUG SCREEN  CK    EKG: None  Radiology: CT Head Wo Contrast Addendum Date: 10/08/2024 ADDENDUM REPORT: 10/08/2024 19:05 ADDENDUM: Additional clinical information obtained. Patient has history of fall with thrombocytopenia. Slightly hyperdense thickened appearance in the region of the dural venous sinuses is felt likely related to previously administered intravenous contrast but as noted, head CT is limited for assessment of dense vessels and hemorrhage in the presence of intravenous contrast. If clinical suspicion remains high, repeat head CT in 12-24 hours could be obtained. Electronically Signed   By: Luke Bun M.D.   On: 10/08/2024 19:05   Result Date: 10/08/2024 CLINICAL DATA:  Substance abuse abnormal urine assessment EXAM: CT HEAD WITHOUT CONTRAST CT CERVICAL SPINE WITHOUT CONTRAST TECHNIQUE: Multidetector CT imaging of the head and cervical spine was performed following the standard protocol without intravenous contrast. Multiplanar CT image reconstructions of the cervical spine were also generated. RADIATION DOSE REDUCTION: This exam was performed according to the departmental dose-optimization program which includes automated exposure control, adjustment of the mA and/or kV according to patient size and/or use of iterative reconstruction technique. COMPARISON:  CT brain and cervical spine 09/07/2023 FINDINGS: CT HEAD FINDINGS Brain: No acute territorial infarction,  hemorrhage or intracranial mass. The ventricles are nonenlarged. Vascular: Limited evaluation due to presence of previously administered contrast. No unexpected calcification Skull: Normal. Negative for fracture or focal lesion. Sinuses/Orbits: No acute finding. Other: None CT CERVICAL SPINE FINDINGS Alignment: No subluxation.  Facet alignment is normal Skull base and vertebrae: No acute fracture. No primary bone lesion or focal pathologic process. Soft tissues and spinal canal: No prevertebral fluid or swelling. No visible canal hematoma. Disc levels: Relatively patent disc spaces. Mild multilevel foraminal narrowing. Upper chest: Emphysema Other: None IMPRESSION: 1. Head CT examination limited by previously administered contrast medium. Allowing for this, no CT evidence for acute intracranial abnormality. 2. No acute osseous abnormality of the cervical spine. 3. Emphysema. Emphysema (ICD10-J43.9). Electronically Signed: By: Luke Bun M.D. On: 10/08/2024 18:40   CT Cervical Spine Wo Contrast Addendum Date: 10/08/2024 ADDENDUM REPORT: 10/08/2024 19:05 ADDENDUM: Additional clinical information obtained. Patient has history of fall with thrombocytopenia. Slightly hyperdense thickened appearance in the region of the dural venous sinuses is felt likely related to previously administered intravenous contrast but as noted, head CT is limited for assessment of dense vessels and hemorrhage in the presence of intravenous contrast. If clinical suspicion remains high, repeat head CT in 12-24 hours could be obtained. Electronically Signed   By: Luke Bun M.D.   On: 10/08/2024 19:05   Result Date: 10/08/2024 CLINICAL DATA:  Substance abuse abnormal urine assessment EXAM: CT HEAD WITHOUT CONTRAST CT CERVICAL SPINE WITHOUT CONTRAST TECHNIQUE: Multidetector CT imaging of the head and cervical spine was performed following the standard protocol without intravenous contrast. Multiplanar CT image reconstructions of the  cervical spine were also generated. RADIATION DOSE REDUCTION: This exam was performed according to the departmental dose-optimization program which includes automated exposure control,  adjustment of the mA and/or kV according to patient size and/or use of iterative reconstruction technique. COMPARISON:  CT brain and cervical spine 09/07/2023 FINDINGS: CT HEAD FINDINGS Brain: No acute territorial infarction, hemorrhage or intracranial mass. The ventricles are nonenlarged. Vascular: Limited evaluation due to presence of previously administered contrast. No unexpected calcification Skull: Normal. Negative for fracture or focal lesion. Sinuses/Orbits: No acute finding. Other: None CT CERVICAL SPINE FINDINGS Alignment: No subluxation.  Facet alignment is normal Skull base and vertebrae: No acute fracture. No primary bone lesion or focal pathologic process. Soft tissues and spinal canal: No prevertebral fluid or swelling. No visible canal hematoma. Disc levels: Relatively patent disc spaces. Mild multilevel foraminal narrowing. Upper chest: Emphysema Other: None IMPRESSION: 1. Head CT examination limited by previously administered contrast medium. Allowing for this, no CT evidence for acute intracranial abnormality. 2. No acute osseous abnormality of the cervical spine. 3. Emphysema. Emphysema (ICD10-J43.9). Electronically Signed: By: Luke Bun M.D. On: 10/08/2024 18:40   US  Abdomen Limited RUQ (LIVER/GB) Result Date: 10/08/2024 CLINICAL DATA:  Abdominal pain EXAM: ULTRASOUND ABDOMEN LIMITED RIGHT UPPER QUADRANT COMPARISON:  10/08/2024, 03/11/2024 FINDINGS: Gallbladder: Gallbladder is moderately distended, with multiple small shadowing gallstones layering dependently, as seen on preceding CT exam. No gallbladder wall thickening or pericholecystic fluid. Negative sonographic Murphy sign. Common bile duct: Diameter: 5 mm Liver: Coarsened increased liver echotexture and nodularity of the hepatic capsule consistent  with cirrhosis. No focal parenchymal abnormality. Portal vein is patent on color Doppler imaging with normal direction of blood flow towards the liver. Other: None. IMPRESSION: 1. Cholelithiasis, with no sonographic evidence of acute cholecystitis. If acute cholecystitis remains a clinical concern, nuclear medicine hepatobiliary scan could be considered. 2. Cirrhosis. Electronically Signed   By: Ozell Daring M.D.   On: 10/08/2024 16:42   CT ABDOMEN PELVIS W CONTRAST Result Date: 10/08/2024 CLINICAL DATA:  Abdominal pain. EXAM: CT ABDOMEN AND PELVIS WITH CONTRAST TECHNIQUE: Multidetector CT imaging of the abdomen and pelvis was performed using the standard protocol following bolus administration of intravenous contrast. RADIATION DOSE REDUCTION: This exam was performed according to the departmental dose-optimization program which includes automated exposure control, adjustment of the mA and/or kV according to patient size and/or use of iterative reconstruction technique. CONTRAST:  OMNIPAQUE  IOHEXOL  300 MG/ML  SOLN COMPARISON:  CT abdomen pelvis dated 03/10/2024. FINDINGS: Lower chest: The visualized lung bases are clear. There is coronary vascular calcification. No intra-abdominal free air.  No free fluid. Hepatobiliary: Cirrhosis. No biliary dilatation. Multiple gallstones. Mild thickened appearance of the gallbladder wall may be related to underlying liver disease. Ultrasound may provide better evaluation if there is clinical concern for acute cholecystitis. Pancreas: Unremarkable. No pancreatic ductal dilatation or surrounding inflammatory changes. Spleen: Normal in size without focal abnormality. Adrenals/Urinary Tract: The adrenal glands are unremarkable. The kidneys, visualized ureters, and urinary bladder appear unremarkable. Stomach/Bowel: There is no bowel obstruction or active inflammation. The appendix is normal. Vascular/Lymphatic: Mild aortoiliac atherosclerotic disease. The IVC is  unremarkable. No portal venous gas. There is no adenopathy. Dilated left splenorenal shunt. Paraesophageal varices. Reproductive: The prostate and seminal vesicles are grossly remarkable. Other: None Musculoskeletal: No acute osseous pathology. Left femoral intramedullary fixation hardware. IMPRESSION: 1. No acute intra-abdominal or pelvic pathology. 2. Cirrhosis with evidence of portal hypertension. 3. Cholelithiasis. 4. No bowel obstruction. Normal appendix. 5.  Aortic Atherosclerosis (ICD10-I70.0). Electronically Signed   By: Vanetta Chou M.D.   On: 10/08/2024 15:01     Procedures   Medications Ordered in  the ED  thiamine  (VITAMIN B1) tablet 100 mg ( Oral See Alternative 10/08/24 1411)    Or  thiamine  (VITAMIN B1) injection 100 mg (100 mg Intravenous Given 10/08/24 1411)  folic acid  (FOLVITE ) tablet 1 mg (1 mg Oral Given 10/08/24 1413)  multivitamin with minerals tablet 1 tablet (1 tablet Oral Given 10/08/24 1413)  LORazepam  (ATIVAN ) tablet 1-4 mg (has no administration in time range)    Or  LORazepam  (ATIVAN ) injection 1-4 mg (has no administration in time range)  phytonadione  (VITAMIN K) 10 mg in dextrose  5 % 50 mL IVPB (10 mg Intravenous New Bag/Given 10/08/24 2048)  iohexol  (OMNIPAQUE ) 300 MG/ML solution 100 mL (100 mLs Intravenous Contrast Given 10/08/24 1431)  LORazepam  (ATIVAN ) tablet 1 mg (1 mg Oral Given 10/08/24 1742)  potassium chloride  SA (KLOR-CON  M) CR tablet 40 mEq (40 mEq Oral Given 10/08/24 2051)    Clinical Course as of 10/08/24 2058  Thu Oct 08, 2024  1911 Smear Review: See Note [ML]    Clinical Course User Index [ML] Willma Duwaine CROME, PA                                 Medical Decision Making Amount and/or Complexity of Data Reviewed Labs: ordered.   Patient presents to the ED for concern of dark urine, this involves an extensive number of treatment options, and is a complaint that carries with it a high risk of complications and morbidity.    The  differential diagnosis includes: Renal etiology - infection Chronic ETOH  Metabolic/dehydration/rhabdomyolysis Ongoing chronic cirrhosis   Co morbidities that complicate the patient evaluation: Chronic alcohol  abuse Decompensated cirrhosis Severe thrombocytopenia Brain injury  Additional history obtained: Additional history obtained from Family, Outside Medical Records, and Past Admission  External records from outside source obtained and reviewed including history, surgical history, allergies, medications. The patient was not a reliable historian, he had difficulty providing a clear, detailed, and consistent account of the presenting symptoms and relevant medical history. The information was obtained directly from the patient and statements were documented in the patient's own words when possible.  There were few discrepancies noted between the history provided and available collateral sources.  Chart review was relied on heavily for adequate patient history.  Lab Tests: I ordered, and personally interpreted labs.   The pertinent results include:  CMP - bilirubin 3.0, AST 206, BUN <5 CBC - platelets 21, WBC 3.2, RBC 3.15 CK - WNL UA - small amount of bilirubin present, no hematuria UDS - negative Ethanol - 390 Salicylate WNL Acetaminophen  WNL  Imaging Studies ordered: I ordered imaging studies including: CT abdomen pelvis with contrast US  abdomen limited right upper quadrant CT C-spine without contrast CT head without contrast I independently visualized and interpreted imaging which showed: Abdominal CT showed no acute intra-abdominal or pelvic pathology, cirrhosis with evidence of portal hypertension, cholelithiasis. Abdominal ultrasound showed cholelithiasis, with no sonographic evidence of acute cholecystitis. CT head/CT cervical spine showed, head CT examination limited by previously administered contrast medium. Allowing for this, no CT evidence for acute intracranial  abnormality, no acute osseous abnormality of the cervical spine, no acute osseous abnormality of the cervical spine. After consult with Dr. Georgean: Additional clinical information obtained. Patient has history of fall with thrombocytopenia. Slightly hyperdense thickened appearance in the region of the dural venous sinuses is felt likely related to previously administered intravenous contrast but as noted, head CT is limited for assessment  of dense vessels and hemorrhage in the presence of intravenous contrast. If clinical suspicion remains high, repeat head CT in 12-24 hours could be obtained. I agree with the radiologist interpretation  Cardiac Monitoring: The patient was maintained on a cardiac monitor.  I personally viewed and interpreted the cardiac monitored which showed an underlying rhythm of: Sinus rhythm  Medicines ordered and prescription drug management: I ordered medications: Folic acid , multivitamin, thiamine , lorazepam  for EtOH withdrawal prevention. Reevaluation of the patient after these medicines showed that the patient stayed the same.  Patient continued to be asymptomatic throughout visit. I have reviewed the patients home medicines and have made adjustments as needed  Test Considered: Diagnostic testing was considered based on the patient's presenting symptoms, risk factors, and initial clinical assessment.  MRI was considered given limited assessment ability with CT, however MRI was no longer available after CT final read. Plan to repeat CT in 12 hours. The approach to diagnostic testing prioritized exclusion of life-threatening conditions  Consultations Obtained:   Consultation was obtained from radiology service regarding patient's head CT due to the impression reading that the CT exam was limited by the previously administered contrast.  Dr. Scott MD was contacted and explained patient's history of EtOH use, poor historian, family relaying information  regarding him falling and hitting his head within the last few days, and platelet count of of 21 - given this, there was question how limited was the radiologist reading and needing to rule out ICH etiology - she stated that given the contrast media she is unable to completely rule out ICH etiology as radiology AI software relayed possible subdural process, however due to contrast media, assessment was limited and was recommended that a repeat CT be completed in 12 hours.  Given patient's medical history, physical exam findings, diagnostic imaging, and consultation with radiology requesting repeat head CT in 12 hours after IV contrast was eliminated, consultation was obtained from Hospitalist Elgergawy to admit patient for thrombocytopenia and needing to rule out a subdural process with repeat head CT.  It was relayed that a concern is patient going into withdrawals in the next 12 hours waiting on head CT, needing to stay on EtOH withdrawal protocol, and he has a high risk for not returning for the head CT given ETOH use. Hospitalist Elgergawy denied admitting patient, however stated that he would follow the patient as a Research scientist (medical) in the emergency department overnight until the patient could get a head CT repeat at 6 AM.  Problem List / ED Course: Problem List: Bilirubinemia Thrombocytopenia EtOH withdrawal protocol Emergency Department Course: The patient presented with dark urine. Initial assessment included history, physical exam, and review of prior medical records.  Given patient's history of chronic alcohol  abuse with associated decompensated cirrhosis, minimal physical exam findings, laboratory findings of increased LFTs, bilirubinemia, thrombocytopenia, and ethanol level 390, inconclusive imaging, and consults to both radiology and the hospitalist plan to proceed with keeping patient in the emergency department until 6 AM and a repeat CT can rule out ICH etiology due to thrombocytopenia.  It is  likely that these diagnostic findings are related to his chronic cirrhosis and continued alcohol  abuse, however, given thrombocytopenia, ICH must be ruled out before discharge.  Vitals have remained stable, and patient has remained asymptomatic in the emergency department.  Patient is to be continued on EtOH withdrawal protocol. Reevaluation: After the interventions noted above, I reevaluated the patient and found that they have :stayed the same  Dispostion: After consideration of the  diagnostic results and the patients response to treatment, I feel that the patent would benefit from boarding in the emergency department until 6 AM, followed by the hospitalist, and a repeat CT scan can rule out ICH.    Communication:   Patient care was turned over to [facility] while in the ED for hospitalist consult.        Final diagnoses:  None    ED Discharge Orders          Ordered    US  Abdomen Limited RUQ/Gall Gladder  Status:  Canceled        10/08/24 1604               Willma Duwaine CROME, [place] 10/08/24 2100    Towana Ozell BROCKS, MD 10/09/24 [phone]

## 2024-10-08 NOTE — ED Provider Notes (Signed)
  Physical Exam  BP 117/75   Pulse 65   Temp 97.8 F (36.6 C) (Oral)   Resp 14   Ht 5' 7 (1.702 m)   Wt 58.1 kg   SpO2 92%   BMI 20.05 kg/m   Physical Exam  Procedures  Procedures  ED Course / MDM   Clinical Course as of 10/08/24 1950  Thu Oct 08, 2024  1911 Smear Review: See Note [ML]    Clinical Course User Index [ML] Gregory Duwaine CROME, PA   Medical Decision Making Amount and/or Complexity of Data Reviewed Labs: ordered. Decision-making details documented in ED Course. Radiology: ordered.  Risk OTC drugs. Prescription drug management.   This patient's care was assumed by me at shift change. The tests pending are repeat head CT. Plan is for discharge after results. Patient has cirrhosis with thrombocytopenia and is intoxicated, initially was seen, had CT abdomen pelvis with contrast which was normal, subsequently had head CT which cannot fully rule out small area of hemorrhage versus contrast artifact.  Radiology recommended repeat CT in 12 to 24 hours.  Signout was taken from [facility].  She did consult with hospitalist, Dr. Sherlon who is going to consult on the patient but given concern for possible head bleed in the setting of thrombocytopenia he feels patient needs to stay physically in the emergency department rather than going upstairs.  Dr. Sherlon has consulted.  Vitals remained stable.     Gregory Sherran LABOR, PA-C 10/09/24 0006    Gregory Ozell BROCKS, MD 10/09/24 [phone]

## 2024-10-08 NOTE — Discharge Instructions (Addendum)
 It was a pleasure caring for you today in the emergency department.  Please attempt to follow-up with [facility] for help with alcohol  cessation.  I prescribed you medication called Librium  which can help reduce the effects of alcohol  withdrawal  Please return to the emergency department for any worsening or worrisome symptoms.    RESOURCE GUIDE  Chronic Pain Problems: Contact [facility]  [phone] Patients need to be referred by their primary care doctor.  Insufficient Money for Medicine: Contact [employer] Way:  call [phone]  No Primary Care Doctor: Call [facility]  [phone] - can help you locate a primary care doctor that  accepts your insurance, provides certain services, etc. Physician Referral Service- [phone]  Agencies that provide inexpensive medical care: [facility]  [phone] [facility]  [phone] [facility]  [phone] [facility]  [phone] [facility]  [phone] [facility]  [phone]  Medicaid-accepting [facility] Providers: [facility]- [address], [address]  [phone], Mon-Fri 9am-7pm, Sat 9am-1pm [facility]- [address], [address] [place]  [phone] [facility]- [address] [place]  [phone] [facility]- [address]  [phone] [facility]- [address] [place], [address], [phone]  Only accepts [place] [facility] [place] patients after they have their name  applied to their card  Self Pay (no insurance) in [facility]: Sickle Cell Patients - [facility]  [address] [place], [phone] [facility]- [address] [place]  [phone]       [place] [facility] [place]- [address]       -     [facility]- see information above (Speak to [employer] if you do not have insurance)       -  [facility]-  [address],  [phone]       -  [facility]- [address] [phone]       -  Dr Catalina-  [address], [address], [place], [phone]       -  [facility] - [address], [phone]       -  [facility]- [address] [phone], also [address] [phone]       -     [facility]- [address] [place], [phone], 1st & 3rd Saturday         every month, 10am-1pm  -     [facility]   [address] [place], [place].   Phone:  [phone], Fax:  [phone]. Hours of Operation:  9 am - 6 pm, M-F.  -     [facility]   [address], [place]   Phone: [phone], Fax: [phone]. Hours of Operation:  8:30 am - 5:30 pm, M-F.    Dental Assistance If unable to pay or uninsured, contact:  [facility]. to become qualified for the adult dental clinic.  Patients with Medicaid: [facility] [phone] [address], [phone] [address] [phone]  If unable to pay, or uninsured, contact [facility] [phone] in [place], [phone] in [facility]) to become qualified for the adult dental clinic  [facility] [phone]  [address] [place], [place] [address] [phone] [email or web address]  Other Proofreader Services: [facility]- [address] [place], [place], [place], [address], [phone], Ext. [phone], 2nd and 4th Thursday of the month at 6:30am.  10 clients each day by appointment, can sometimes see walk-in patients if someone does not show for an appointment. [facility]- [address] [place], [place], [address], [phone] [facility] [address] [place], [place], [address], [phone] [facility] Health Department- [phone] [facility] Health Department- [phone] [facility] - Inpatient [phone]

## 2024-10-08 NOTE — ED Notes (Signed)
 Date and time results received: 10/08/24 1329   Test: ETOH Critical Value: 390  Name of Provider Notified: Patsey, MD

## 2024-10-08 NOTE — ED Triage Notes (Addendum)
 Pt arrived via [facility] from home for medical evaluation. Pt reports he is unable to be seen by [facility] or [facility] due to something they saw in his Urine. Pt reports his Engineer, drilling wanted him to be seen in the ER for evaluation. Pt reports he needs help getting to Rehab for Alcohol  abuse.  Pt reports he has 30 days to get himself into Rehab or he has to go do more time.

## 2024-10-09 ENCOUNTER — Emergency Department (HOSPITAL_COMMUNITY): Payer: MEDICAID

## 2024-10-09 DIAGNOSIS — F1010 Alcohol abuse, uncomplicated: Secondary | ICD-10-CM | POA: Diagnosis not present

## 2024-10-09 DIAGNOSIS — D696 Thrombocytopenia, unspecified: Secondary | ICD-10-CM | POA: Diagnosis not present

## 2024-10-09 DIAGNOSIS — R822 Biliuria: Secondary | ICD-10-CM | POA: Diagnosis not present

## 2024-10-09 LAB — CBC WITH DIFFERENTIAL/PLATELET
Abs Immature Granulocytes: 0.01 K/uL (ref 0.00–0.07)
Basophils Absolute: 0.1 K/uL (ref 0.0–0.1)
Basophils Relative: 2 %
Eosinophils Absolute: 0.1 K/uL (ref 0.0–0.5)
Eosinophils Relative: 3 %
HCT: 33.6 % — ABNORMAL LOW (ref 39.0–52.0)
Hemoglobin: 11.5 g/dL — ABNORMAL LOW (ref 13.0–17.0)
Immature Granulocytes: 0 %
Lymphocytes Relative: 44 %
Lymphs Abs: 1.4 K/uL (ref 0.7–4.0)
MCH: 36.5 pg — ABNORMAL HIGH (ref 26.0–34.0)
MCHC: 34.2 g/dL (ref 30.0–36.0)
MCV: 106.7 fL — ABNORMAL HIGH (ref 80.0–100.0)
Monocytes Absolute: 0.3 K/uL (ref 0.1–1.0)
Monocytes Relative: 8 %
Neutro Abs: 1.4 K/uL — ABNORMAL LOW (ref 1.7–7.7)
Neutrophils Relative %: 43 %
Platelets: 21 K/uL — CL (ref 150–400)
RBC: 3.15 MIL/uL — ABNORMAL LOW (ref 4.22–5.81)
RDW: 14.4 % (ref 11.5–15.5)
WBC: 3.2 K/uL — ABNORMAL LOW (ref 4.0–10.5)
nRBC: 0 % (ref 0.0–0.2)

## 2024-10-09 MED ORDER — ONDANSETRON HCL 4 MG PO TABS
4.0000 mg | ORAL_TABLET | Freq: Three times a day (TID) | ORAL | 0 refills | Status: DC | PRN
Start: 2024-10-09 — End: 2024-10-30

## 2024-10-09 MED ORDER — CHLORDIAZEPOXIDE HCL 25 MG PO CAPS: ORAL_CAPSULE | ORAL | 0 refills | Status: DC

## 2024-10-09 MED ORDER — MULTI-VITAMIN/MINERALS PO TABS: 1.0000 | ORAL_TABLET | Freq: Every day | ORAL | 1 refills | Status: AC

## 2024-10-09 NOTE — ED Notes (Signed)
 Pt was resting comfortably, but easily woken to answer CIWA questions.  CIWA is a 0 at this time.

## 2024-10-09 NOTE — ED Provider Notes (Signed)
  Provider Note MRN:  [id number]  Arrival date & time: 10/09/24    ED Course and Medical Decision Making  Assumed care from Dr Roselyn at shift change.  See note from prior team for complete details, in brief:  Clinical Course as of 10/09/24 0752  Thu Oct 08, 2024  1911 Smear Review: See Note [ML]  Fri Oct 09, 2024  0709 Handoff CS 37 yo/f Etoh/substance abuse +etoh ?bleed on CTH but contrast noted Rpt CTH pending  ?librium  o/p vs admit [SG]  0738 RPT CTH is WNL [SG]    Clinical Course User Index [ML] Willma Duwaine CROME, PA [SG] Elnor Savant A, DO     Repeat head CT is within normal limits Assessed patient at bedside, reports he is feeling much better, he is eager for discharge.  Does not appear to be experiencing acute alcohol  withdrawal.  Will continue Librium  taper, multivitamin, encourage patient follow-up with outpatient detox/[facility]  7:52 AM:  I have discussed the diagnosis/risks/treatment options with the patient.  Evaluation and diagnostic testing in the emergency department does not suggest an emergent condition requiring admission or immediate intervention beyond what has been performed at this time.  They will follow up with PCP, outpatient detox. We also discussed returning to the ED immediately if new or worsening sx occur. We discussed the sx which are most concerning (e.g., sudden worsening pain, fever, inability to tolerate by mouth, alcohol  withdrawal) that necessitate immediate return.    The patient appears reasonably screened and/or stabilized for discharge and I doubt any other medical condition or other [facility] requiring further screening, evaluation, or treatment in the ED at this time prior to discharge.    Procedures  Final Clinical Impressions(s) / ED Diagnoses     ICD-10-CM   1. Thrombocytopenia  D69.6     2. Alcoholic intoxication without complication  F10.920       ED Discharge Orders          Ordered    US  Abdomen Limited RUQ/Gall Gladder  Status:   Canceled        10/08/24 1604            Discharge Instructions   None        Elnor Savant LABOR, DO 10/09/24 [phone]

## 2024-10-09 NOTE — Progress Notes (Signed)
  Progress Note   Patient: Bashir Marchetti FMW:[id number] DOB: [birth year] DOA: 10/08/2024     0 DOS: the patient was seen and examined on 10/09/2024   Brief hospital course: As per H&P written by Dr. Sherlon on 10/08/24. Nakeem Murnane  is a 38 y.o. male, with history of chronic alcohol  use/abuse with liver cirrhosis, pancytopenia, thrombocytopenia, with recent hospitalization last July for thrombocytopenia, alcohol  abuse and alcoholic hepatitis with ascites, patient presents to ED for evaluation of dark-colored urine, reported has started few days ago, but he was trying to get into [facility] for alcohol  recovery, and they turned him away asking for medical clearance, patient himself denies any complaints currently, reports last drink was this morning, he denies any urinary problem, no fever, no chills, no flank pain, no altered mental status, no nausea, no vomiting, with history of multiple events of alcohol  intoxication, he drinks 5-6 beers per day, nightly patient with history of falls while intoxicated, so CT head was obtained, but given he received IV contrast prior to CT head, it was a slightly hyperdense thickened appearance in the region of dural venous sinuses likely related to intravenous contrast, so repeat CT head was recommended in 12 hours, and [facility] hospitalist consulted to admit while awaiting repeat CT head.   Assessment and Plan: 1-Fall -repeat CT head has rule out the presence of SDH/ICH. -hemodynamically stable and not signs of neuro deficits appreciated.  2-Bilirubinuria  -secondary to chronic liver disease, alcohol  abuse and hyperbilirubinemia -continue outpatient follow up with GI service -maintain complete alcohol  abstinence and maintain adequate hydration -hematuria rule out -renal function WNL    3-Thrombocytopenia/alcohol  abuse/Alcoholic liver cirrhosis with ascites/Hyperbilirubinemia/Pancytopenia/ Transaminitis -overall stable and with labs at baseline for  him -no acute withdrawal symptoms currently present -continue outpatient follow up. -cessation counseling provided -no acute withdrawal appreciated -outpatient detox program recommended.   Subjective:  No overnight events, in no acute distress. Patient is hemodynamically stable and demonstrating no neurologic deficit.   Physical Exam: Vitals:   10/09/24 0600 10/09/24 0615 10/09/24 0753 10/09/24 0754  BP: 116/81 120/79  125/88  Pulse: 74 73  89  Resp:  16  16  Temp:    98.7 F (37.1 C)  TempSrc:    Oral  SpO2: 91% 90% 98% 95%  Weight:      Height:       General exam: Alert, awake, oriented x 3 Respiratory system: Clear to auscultation. Respiratory effort normal. Cardiovascular system:RRR. No murmurs, rubs, gallops. Gastrointestinal system: Abdomen is nondistended, soft and nontender. No organomegaly or masses felt. Normal bowel sounds heard. Central nervous system: Alert and oriented. No focal neurological deficits. Extremities: No C/C/E, +pedal pulses Skin: No rashes, lesions or ulcers Psychiatry: Judgement and insight appear normal. Mood & affect appropriate.    Family Communication: no family at bedside.  Disposition: Safe for discharge. IM will sign off .  Time spent: 35 minutes  Author: Eric Nunnery, MD 10/09/2024 8:02 AM  For on call review [email or web address].

## 2024-10-19 ENCOUNTER — Encounter: Payer: Self-pay | Admitting: Radiology

## 2024-10-22 ENCOUNTER — Emergency Department (HOSPITAL_COMMUNITY): Payer: MEDICAID

## 2024-10-22 ENCOUNTER — Inpatient Hospital Stay (HOSPITAL_COMMUNITY)
Admission: EM | Admit: 2024-10-22 | Discharge: 2024-10-30 | DRG: 553 | Disposition: A | Discharge status: Home / Self Care | Payer: MEDICAID | Attending: Internal Medicine | Admitting: Internal Medicine

## 2024-10-22 DIAGNOSIS — R000 Tachycardia, unspecified: Secondary | ICD-10-CM | POA: Diagnosis not present

## 2024-10-22 DIAGNOSIS — D6959 Other secondary thrombocytopenia: Secondary | ICD-10-CM | POA: Diagnosis present

## 2024-10-22 DIAGNOSIS — F10239 Alcohol dependence with withdrawal, unspecified: Secondary | ICD-10-CM | POA: Diagnosis present

## 2024-10-22 DIAGNOSIS — F17210 Nicotine dependence, cigarettes, uncomplicated: Secondary | ICD-10-CM | POA: Diagnosis present

## 2024-10-22 DIAGNOSIS — E43 Unspecified severe protein-calorie malnutrition: Secondary | ICD-10-CM | POA: Diagnosis present

## 2024-10-22 DIAGNOSIS — K7290 Hepatic failure, unspecified without coma: Secondary | ICD-10-CM | POA: Diagnosis present

## 2024-10-22 DIAGNOSIS — K7030 Alcoholic cirrhosis of liver without ascites: Secondary | ICD-10-CM | POA: Diagnosis present

## 2024-10-22 DIAGNOSIS — K7460 Unspecified cirrhosis of liver: Secondary | ICD-10-CM | POA: Diagnosis present

## 2024-10-22 DIAGNOSIS — D539 Nutritional anemia, unspecified: Secondary | ICD-10-CM | POA: Diagnosis present

## 2024-10-22 DIAGNOSIS — D61818 Other pancytopenia: Secondary | ICD-10-CM | POA: Diagnosis present

## 2024-10-22 DIAGNOSIS — M25462 Effusion, left knee: Secondary | ICD-10-CM | POA: Diagnosis present

## 2024-10-22 DIAGNOSIS — F32A Depression, unspecified: Secondary | ICD-10-CM | POA: Diagnosis present

## 2024-10-22 DIAGNOSIS — K625 Hemorrhage of anus and rectum: Secondary | ICD-10-CM | POA: Diagnosis not present

## 2024-10-22 DIAGNOSIS — M25062 Hemarthrosis, left knee: Principal | ICD-10-CM | POA: Diagnosis present

## 2024-10-22 DIAGNOSIS — R441 Visual hallucinations: Secondary | ICD-10-CM | POA: Diagnosis present

## 2024-10-22 DIAGNOSIS — D638 Anemia in other chronic diseases classified elsewhere: Secondary | ICD-10-CM | POA: Diagnosis present

## 2024-10-22 DIAGNOSIS — E440 Moderate protein-calorie malnutrition: Secondary | ICD-10-CM | POA: Diagnosis present

## 2024-10-22 DIAGNOSIS — G9341 Metabolic encephalopathy: Secondary | ICD-10-CM | POA: Diagnosis present

## 2024-10-22 DIAGNOSIS — K7011 Alcoholic hepatitis with ascites: Secondary | ICD-10-CM | POA: Diagnosis present

## 2024-10-22 DIAGNOSIS — Z720 Tobacco use: Secondary | ICD-10-CM | POA: Diagnosis present

## 2024-10-22 DIAGNOSIS — F410 Panic disorder [episodic paroxysmal anxiety] without agoraphobia: Secondary | ICD-10-CM | POA: Diagnosis present

## 2024-10-22 DIAGNOSIS — E559 Vitamin D deficiency, unspecified: Secondary | ICD-10-CM | POA: Diagnosis present

## 2024-10-22 DIAGNOSIS — R7689 Other specified abnormal immunological findings in serum: Secondary | ICD-10-CM | POA: Diagnosis present

## 2024-10-22 DIAGNOSIS — F1120 Opioid dependence, uncomplicated: Secondary | ICD-10-CM | POA: Diagnosis present

## 2024-10-22 DIAGNOSIS — D696 Thrombocytopenia, unspecified: Secondary | ICD-10-CM | POA: Diagnosis present

## 2024-10-22 DIAGNOSIS — Z6820 Body mass index (BMI) 20.0-20.9, adult: Secondary | ICD-10-CM

## 2024-10-22 DIAGNOSIS — M25562 Pain in left knee: Secondary | ICD-10-CM | POA: Diagnosis present

## 2024-10-22 DIAGNOSIS — F1010 Alcohol abuse, uncomplicated: Secondary | ICD-10-CM | POA: Diagnosis present

## 2024-10-22 DIAGNOSIS — Z79899 Other long term (current) drug therapy: Secondary | ICD-10-CM

## 2024-10-22 DIAGNOSIS — R739 Hyperglycemia, unspecified: Secondary | ICD-10-CM | POA: Diagnosis present

## 2024-10-22 DIAGNOSIS — Z91040 Latex allergy status: Secondary | ICD-10-CM

## 2024-10-22 MED ORDER — OXYCODONE-ACETAMINOPHEN 5-325 MG PO TABS
2.0000 | ORAL_TABLET | Freq: Once | ORAL | Status: AC
  Administered 2024-10-22: 2 via ORAL
  Filled 2024-10-22: qty 2

## 2024-10-22 MED ORDER — LIDOCAINE HCL (PF) 1 % IJ SOLN
5.0000 mL | Freq: Once | INTRAMUSCULAR | Status: DC
  Filled 2024-10-22: qty 5
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
## 2024-10-22 NOTE — ED Notes (Signed)
 ED Provider at bedside.

## 2024-10-22 NOTE — ED Provider Notes (Signed)
 [place] EMERGENCY DEPARTMENT AT [facility] Provider Note   CSN: [id number] Arrival date & time: 10/22/24  1947     Patient presents with: Knee Pain   Gregory Murphy is a 38 y.o. male.  {Add pertinent medical, surgical, social history, OB history to HPI:32947} Patient is a 38 year old male with past medical history of of thrombocytopenia, chronic alcohol  abuse, cirrhosis, traumatic injury to left femur requiring surgery.  Patient presenting today with complaints of severe left knee pain and swelling.  He has had a hemarthrosis in the past and believes this has recurred.  He denies to me that he has experienced any new injury or trauma.  He states he is unable to ambulate and cannot bear weight due to this discomfort.       Prior to Admission medications   Medication Sig Start Date End Date Taking? Authorizing Provider  chlordiazePOXIDE  (LIBRIUM ) 25 MG capsule 50mg  PO TID x 1D, then 25-50mg  PO BID X 1D, then 25-50mg  PO QD X 1D 10/09/24   Elnor Jayson LABOR, DO  Multiple Vitamins-Minerals (MULTIVITAMIN WITH MINERALS) tablet Take 1 tablet by mouth daily. 10/09/24 12/08/24  Elnor Jayson LABOR, DO  ondansetron  (ZOFRAN ) 4 MG tablet Take 1 tablet (4 mg total) by mouth every 8 (eight) hours as needed for nausea or vomiting. 10/09/24   Elnor Jayson LABOR, DO  thiamine  (VITAMIN B-1) 100 MG tablet Take 1 tablet (100 mg total) by mouth daily. 07/17/24   Johnson, Clanford L, MD  vitamin B-12 (CYANOCOBALAMIN ) 500 MCG tablet Take 500 mcg by mouth daily.    [provider]    Allergies: Latex    Review of Systems  All other systems reviewed and are negative.   Updated Vital Signs BP (!) 166/115   Pulse (!) 101   Temp 98 F (36.7 C) (Temporal)   Resp 18   Ht 5' 7 (1.702 m)   Wt 58 kg   SpO2 100%   BMI 20.03 kg/m   Physical Exam Vitals and nursing note reviewed.  Constitutional:      Appearance: Normal appearance.  Pulmonary:     Effort: Pulmonary effort is normal.   Musculoskeletal:     Comments: There is swelling noted to the left knee.  There is a significant effusion noted.  He has pain with any range of motion.  Skin:    General: Skin is warm and dry.  Neurological:     General: No focal deficit present.     Mental Status: He is alert and oriented to person, place, and time.     (all labs ordered are listed, but only abnormal results are displayed) Labs Reviewed - No data to display  EKG: None  Radiology: DG Knee Complete 4 Views Left Result Date: 10/22/2024 CLINICAL DATA:  Knee pain. EXAM: LEFT KNEE - COMPLETE 4+ VIEW COMPARISON:  None Available. FINDINGS: There is no evidence of acute fracture. A radiopaque intramedullary rod, large fixation plate and multiple fixation screws are seen within the distal left femur. No evidence of arthropathy or other focal bone abnormality. There is a large joint effusion. Moderate severity suprapatellar soft tissue swelling is also seen. IMPRESSION: 1. Large joint effusion and moderate severity suprapatellar soft tissue swelling. 2. Prior ORIF of the distal left femur. Electronically Signed   By: Suzen Dials M.D.   On: 10/22/2024 20:24    {Document cardiac monitor, telemetry assessment procedure when appropriate:32947} Procedures   Medications Ordered in the ED  lidocaine  (PF) (XYLOCAINE ) 1 %  injection 5 mL (has no administration in time range)  oxyCODONE -acetaminophen  (PERCOCET/ROXICET) 5-325 MG per tablet 2 tablet (2 tablets Oral Given 10/22/24 2337)      {Click here for ABCD2, HEART and other calculators REFRESH Note before signing:1}                              Medical Decision Making Amount and/or Complexity of Data Reviewed Radiology: ordered.  Risk Prescription drug management.   ***  {Document critical care time when appropriate  Document review of labs and clinical decision tools ie CHADS2VASC2, etc  Document your independent review of radiology images and any outside records   Document your discussion with family members, caretakers and with consultants  Document social determinants of health affecting pt's care  Document your decision making why or why not admission, treatments were needed:32947:::1}   Final diagnoses:  None    ED Discharge Orders     None

## 2024-10-23 ENCOUNTER — Encounter (HOSPITAL_COMMUNITY): Payer: Self-pay | Admitting: Internal Medicine

## 2024-10-23 ENCOUNTER — Observation Stay (HOSPITAL_COMMUNITY): Payer: MEDICAID

## 2024-10-23 ENCOUNTER — Other Ambulatory Visit: Payer: Self-pay

## 2024-10-23 DIAGNOSIS — M25062 Hemarthrosis, left knee: Principal | ICD-10-CM | POA: Diagnosis present

## 2024-10-23 DIAGNOSIS — D696 Thrombocytopenia, unspecified: Secondary | ICD-10-CM

## 2024-10-23 DIAGNOSIS — M25462 Effusion, left knee: Secondary | ICD-10-CM | POA: Diagnosis present

## 2024-10-23 LAB — CBC WITH DIFFERENTIAL/PLATELET
Abs Immature Granulocytes: 0.01 K/uL (ref 0.00–0.07)
Abs Immature Granulocytes: 0.01 K/uL (ref 0.00–0.07)
Basophils Absolute: 0.1 K/uL (ref 0.0–0.1)
Basophils Absolute: 0.1 K/uL (ref 0.0–0.1)
Basophils Relative: 1 %
Basophils Relative: 2 %
Eosinophils Absolute: 0 K/uL (ref 0.0–0.5)
Eosinophils Absolute: 0 K/uL (ref 0.0–0.5)
Eosinophils Relative: 0 %
Eosinophils Relative: 1 %
HCT: 32.3 % — ABNORMAL LOW (ref 39.0–52.0)
HCT: 34 % — ABNORMAL LOW (ref 39.0–52.0)
Hemoglobin: 11.2 g/dL — ABNORMAL LOW (ref 13.0–17.0)
Hemoglobin: 11.9 g/dL — ABNORMAL LOW (ref 13.0–17.0)
Immature Granulocytes: 0 %
Immature Granulocytes: 0 %
Lymphocytes Relative: 27 %
Lymphocytes Relative: 27 %
Lymphs Abs: 1 K/uL (ref 0.7–4.0)
Lymphs Abs: 1.2 K/uL (ref 0.7–4.0)
MCH: 36.4 pg — ABNORMAL HIGH (ref 26.0–34.0)
MCH: 36.6 pg — ABNORMAL HIGH (ref 26.0–34.0)
MCHC: 34.7 g/dL (ref 30.0–36.0)
MCHC: 35 g/dL (ref 30.0–36.0)
MCV: 104.6 fL — ABNORMAL HIGH (ref 80.0–100.0)
MCV: 104.9 fL — ABNORMAL HIGH (ref 80.0–100.0)
Monocytes Absolute: 0.3 K/uL (ref 0.1–1.0)
Monocytes Absolute: 0.3 K/uL (ref 0.1–1.0)
Monocytes Relative: 7 %
Monocytes Relative: 8 %
Neutro Abs: 2.3 K/uL (ref 1.7–7.7)
Neutro Abs: 2.9 K/uL (ref 1.7–7.7)
Neutrophils Relative %: 62 %
Neutrophils Relative %: 65 %
Platelets: 25 K/uL — CL (ref 150–400)
Platelets: 26 K/uL — CL (ref 150–400)
RBC: 3.08 MIL/uL — ABNORMAL LOW (ref 4.22–5.81)
RBC: 3.25 MIL/uL — ABNORMAL LOW (ref 4.22–5.81)
RDW: 12.7 % (ref 11.5–15.5)
RDW: 12.9 % (ref 11.5–15.5)
WBC: 3.7 K/uL — ABNORMAL LOW (ref 4.0–10.5)
WBC: 4.6 K/uL (ref 4.0–10.5)
nRBC: 0 % (ref 0.0–0.2)
nRBC: 0 % (ref 0.0–0.2)

## 2024-10-23 LAB — BASIC METABOLIC PANEL WITH GFR
Anion gap: 10 (ref 5–15)
BUN: <5 mg/dL — ABNORMAL LOW (ref 6–20)
CO2: 26 mmol/L (ref 22–32)
Calcium: 7.9 mg/dL — ABNORMAL LOW (ref 8.9–10.3)
Chloride: 97 mmol/L — ABNORMAL LOW (ref 98–111)
Creatinine, Ser: 0.75 mg/dL (ref 0.61–1.24)
GFR, Estimated: >60 mL/min (ref >60)
Glucose, Bld: 234 mg/dL — ABNORMAL HIGH (ref 70–99)
Potassium: 3.4 mmol/L — ABNORMAL LOW (ref 3.5–5.1)
Sodium: 133 mmol/L — ABNORMAL LOW (ref 135–145)

## 2024-10-23 LAB — COMPREHENSIVE METABOLIC PANEL WITH GFR
ALT: 47 U/L — ABNORMAL HIGH (ref 0–44)
AST: 117 U/L — ABNORMAL HIGH (ref 15–41)
Albumin: 3.2 g/dL — ABNORMAL LOW (ref 3.5–5.0)
Alkaline Phosphatase: 139 U/L — ABNORMAL HIGH (ref 38–126)
Anion gap: 9 (ref 5–15)
BUN: <5 mg/dL — ABNORMAL LOW (ref 6–20)
CO2: 27 mmol/L (ref 22–32)
Calcium: 7.7 mg/dL — ABNORMAL LOW (ref 8.9–10.3)
Chloride: 99 mmol/L (ref 98–111)
Creatinine, Ser: 0.89 mg/dL (ref 0.61–1.24)
GFR, Estimated: >60 mL/min (ref >60)
Glucose, Bld: 162 mg/dL — ABNORMAL HIGH (ref 70–99)
Potassium: 3.7 mmol/L (ref 3.5–5.1)
Sodium: 135 mmol/L (ref 135–145)
Total Bilirubin: 2.6 mg/dL — ABNORMAL HIGH (ref 0.0–1.2)
Total Protein: 6.7 g/dL (ref 6.5–8.1)

## 2024-10-23 LAB — GLUCOSE, CAPILLARY
Glucose-Capillary: 147 mg/dL — ABNORMAL HIGH (ref 70–99)
Glucose-Capillary: 127 mg/dL — ABNORMAL HIGH (ref 70–99)
Glucose-Capillary: 100 mg/dL — ABNORMAL HIGH (ref 70–99)
Glucose-Capillary: 112 mg/dL — ABNORMAL HIGH (ref 70–99)
Glucose-Capillary: 86 mg/dL (ref 70–99)

## 2024-10-23 LAB — HEMOGLOBIN A1C
Hgb A1c MFr Bld: 4.2 % — ABNORMAL LOW (ref 4.8–5.6)
Mean Plasma Glucose: 73.84 mg/dL

## 2024-10-23 LAB — PHOSPHORUS: Phosphorus: 3.7 mg/dL (ref 2.5–4.6)

## 2024-10-23 LAB — PROTIME-INR
INR: 1.6 — ABNORMAL HIGH (ref 0.8–1.2)
Prothrombin Time: 20.2 s — ABNORMAL HIGH (ref 11.4–15.2)

## 2024-10-23 LAB — HEPATIC FUNCTION PANEL
ALT: 52 U/L — ABNORMAL HIGH (ref 0–44)
AST: 134 U/L — ABNORMAL HIGH (ref 15–41)
Albumin: 3.4 g/dL — ABNORMAL LOW (ref 3.5–5.0)
Alkaline Phosphatase: 144 U/L — ABNORMAL HIGH (ref 38–126)
Bilirubin, Direct: 1.8 mg/dL — ABNORMAL HIGH (ref 0.0–0.2)
Indirect Bilirubin: 0.8 mg/dL (ref 0.3–0.9)
Total Bilirubin: 2.6 mg/dL — ABNORMAL HIGH (ref 0.0–1.2)
Total Protein: 7.1 g/dL (ref 6.5–8.1)

## 2024-10-23 LAB — MAGNESIUM: Magnesium: 1.9 mg/dL (ref 1.7–2.4)

## 2024-10-23 LAB — HIV ANTIBODY (ROUTINE TESTING W REFLEX): HIV Screen 4th Generation wRfx: NONREACTIVE

## 2024-10-23 MED ORDER — ADULT MULTIVITAMIN W/MINERALS CH
1.0000 | ORAL_TABLET | Freq: Every day | ORAL | Status: DC
  Administered 2024-10-23 – 2024-10-30 (×7): 1 via ORAL
  Filled 2024-10-23 (×7): qty 1

## 2024-10-23 MED ORDER — CHLORDIAZEPOXIDE HCL 5 MG PO CAPS
5.0000 mg | ORAL_CAPSULE | Freq: Three times a day (TID) | ORAL | Status: DC
  Administered 2024-10-24: 5 mg via ORAL
  Filled 2024-10-23: qty 1

## 2024-10-23 MED ORDER — POLYETHYLENE GLYCOL 3350 17 G PO PACK: 17.0000 g | PACK | Freq: Every day | ORAL | Status: DC | PRN

## 2024-10-23 MED ORDER — LORAZEPAM 1 MG PO TABS
1.0000 mg | ORAL_TABLET | ORAL | Status: DC | PRN
  Administered 2024-10-25: 2 mg via ORAL
  Filled 2024-10-23: qty 2

## 2024-10-23 MED ORDER — THIAMINE MONONITRATE 100 MG PO TABS
100.0000 mg | ORAL_TABLET | Freq: Every day | ORAL | Status: DC
  Administered 2024-10-23 – 2024-10-27 (×4): 100 mg via ORAL
  Filled 2024-10-23 (×4): qty 1

## 2024-10-23 MED ORDER — ORAL CARE MOUTH RINSE: 15.0000 mL | OROMUCOSAL | Status: DC | PRN

## 2024-10-23 MED ORDER — OXYCODONE HCL 5 MG PO TABS
5.0000 mg | ORAL_TABLET | ORAL | Status: AC | PRN
  Administered 2024-10-23 – 2024-10-24 (×5): 10 mg via ORAL
  Filled 2024-10-23 (×5): qty 2

## 2024-10-23 MED ORDER — PROCHLORPERAZINE EDISYLATE 10 MG/2ML IJ SOLN
5.0000 mg | Freq: Four times a day (QID) | INTRAMUSCULAR | Status: DC | PRN
  Administered 2024-10-23: 5 mg via INTRAVENOUS
  Filled 2024-10-23: qty 2

## 2024-10-23 MED ORDER — CHLORDIAZEPOXIDE HCL 5 MG PO CAPS
10.0000 mg | ORAL_CAPSULE | Freq: Three times a day (TID) | ORAL | Status: AC
  Administered 2024-10-23 (×3): 10 mg via ORAL
  Filled 2024-10-23 (×3): qty 2

## 2024-10-23 MED ORDER — LACTATED RINGERS IV SOLN: INTRAVENOUS | Status: DC

## 2024-10-23 MED ORDER — THIAMINE HCL 100 MG/ML IJ SOLN
100.0000 mg | Freq: Every day | INTRAMUSCULAR | Status: DC
  Administered 2024-10-26: 100 mg via INTRAVENOUS
  Filled 2024-10-23: qty 2

## 2024-10-23 MED ORDER — INSULIN ASPART 100 UNIT/ML IJ SOLN: 0.0000 [IU] | Freq: Every day | INTRAMUSCULAR | Status: DC

## 2024-10-23 MED ORDER — HYDROMORPHONE HCL 1 MG/ML IJ SOLN
0.5000 mg | INTRAMUSCULAR | Status: AC | PRN
  Administered 2024-10-23 – 2024-10-24 (×5): 0.5 mg via INTRAVENOUS
  Filled 2024-10-23 (×5): qty 0.5

## 2024-10-23 MED ORDER — INSULIN ASPART 100 UNIT/ML IJ SOLN
0.0000 [IU] | Freq: Three times a day (TID) | INTRAMUSCULAR | Status: DC
  Administered 2024-10-23 (×2): 1 [IU] via SUBCUTANEOUS
  Filled 2024-10-23 (×2): qty 1

## 2024-10-23 MED ORDER — FOLIC ACID 1 MG PO TABS
1.0000 mg | ORAL_TABLET | Freq: Every day | ORAL | Status: DC
  Administered 2024-10-23 – 2024-10-30 (×7): 1 mg via ORAL
  Filled 2024-10-23 (×7): qty 1

## 2024-10-23 MED ORDER — LORAZEPAM 2 MG/ML IJ SOLN
1.0000 mg | INTRAMUSCULAR | Status: DC | PRN
  Administered 2024-10-25 (×2): 2 mg via INTRAVENOUS
  Administered 2024-10-25: 4 mg via INTRAVENOUS
  Administered 2024-10-25: 2 mg via INTRAVENOUS
  Administered 2024-10-26: 4 mg via INTRAVENOUS
  Filled 2024-10-23: qty 1
  Filled 2024-10-23: qty 2
  Filled 2024-10-23 (×2): qty 1
  Filled 2024-10-23: qty 2

## 2024-10-23 NOTE — Plan of Care (Signed)

## 2024-10-23 NOTE — ED Notes (Signed)
 Pt told writer that he is an alcohol  and normally drinks 10-12 beers (12 ounces each) per day. Last drink around 7pm last night. Pt reports history of Dts with withdrawal.

## 2024-10-23 NOTE — Discharge Instructions (Addendum)

## 2024-10-23 NOTE — Progress Notes (Signed)
   10/23/24 1954 10/23/24 2006  CIWA-Ar  BP (!) 157/99  --   Pulse Rate (!) 128  --   Nausea and Vomiting  --  1  Tactile Disturbances  --  0  Tremor  --  3  Auditory Disturbances  --  0  Paroxysmal Sweats  --  2  Visual Disturbances  --  0  Anxiety  --  0  Headache, Fullness in Head  --  0  Agitation  --  0  Orientation and Clouding of Sensorium  --  0  CIWA-Ar Total  --  6   Scheduled librium  given. Reassessment per order.

## 2024-10-23 NOTE — Consult Note (Addendum)
 [facility] Consultation Hematology/Oncology  CONSULTING PHYSICIAN:  Niels Hurst, DO  REASON FOR CONSULT: Thrombocytopenia    HISTORY OF PRESENT ILLNESS:   Gregory Murphy is a 38 y.o. male with past medical history of  Gregory Murphy is a 38 y.o. male with medical history significant for alcohol  abuse, alcoholic liver cirrhosis, chronic severe thrombocytopenia, chronic pancytopenia, history of traumatic injury to left femur requiring surgery (Has rods from left hip to his left knee), prior left knee effusion status post arthrocentesis on 07/14/2024, who presents to the ER with left knee pain and swelling x 3 days.  States since his left knee surgery he has had recurrent left knee effusion.  Unable to ambulate or bear weight on the left lower extremity due to severe left knee pain.  No fevers or chills.  Denies any recent trauma to his left knee.  Last alcoholic drink was last night.  Drinks 10-12 beers per day.  EMS was activated, and the patient was brought into the ER for further evaluation.   Patient reports history of chronic alcohol  abuse.  He is unsure if he has any liver damage or he has never been told he has liver damage.  Reports he had some rectal bleeding last night but other than that denies melena hematochezia.  He has never had a colonoscopy.    MEDICATIONS: I have reviewed the patient's current medications.     PERFORMANCE STATUS: [name] performance status is 2 - Symptomatic, <50% confined to bed  PHYSICAL EXAM: Most Recent Vital Signs: Blood pressure (!) 134/103, pulse 92, temperature 97.8 F (36.6 C), temperature source Oral, resp. rate 12, height 6' (1.829 m), weight 270 lb (122.5 kg), SpO2 91%.  Physical Exam Constitutional:      Appearance: Normal appearance.  Cardiovascular:     Rate and Rhythm: Normal rate and regular rhythm.  Pulmonary:     Effort: Pulmonary effort is normal.     Breath sounds: Normal breath sounds.  Abdominal:      General: Bowel sounds are normal.     Palpations: Abdomen is soft.  Musculoskeletal:        General: No swelling. Normal range of motion.     Comments: Large left knee effusion.  Neurological:     Mental Status: He is alert and oriented to person, place, and time. Mental status is at baseline.        LABORATORY DATA:   Last CBC Lab Results  Component Value Date   WBC 4.6 10/23/2024   HGB 11.2 (L) 10/23/2024   HCT 32.3 (L) 10/23/2024   MCV 104.9 (H) 10/23/2024   MCH 36.4 (H) 10/23/2024   RDW 12.9 10/23/2024   PLT 26 (LL) 10/23/2024     Last metabolic panel Lab Results  Component Value Date   GLUCOSE 162 (H) 10/23/2024   NA 135 10/23/2024   K 3.7 10/23/2024   CL 99 10/23/2024   CO2 27 10/23/2024   BUN <5 (L) 10/23/2024   CREATININE 0.89 10/23/2024   GFRNONAA >60 10/23/2024   CALCIUM  7.7 (L) 10/23/2024   PHOS 3.7 10/23/2024   PROT 6.7 10/23/2024   ALBUMIN  3.2 (L) 10/23/2024   BILITOT 2.6 (H) 10/23/2024   ALKPHOS 139 (H) 10/23/2024   AST 117 (H) 10/23/2024   ALT 47 (H) 10/23/2024   ANIONGAP 9 10/23/2024      RADIOGRAPHY: DG Knee Complete 4 Views Left CLINICAL DATA:  Knee pain.  EXAM: LEFT KNEE - COMPLETE 4+ VIEW  COMPARISON:  None Available.  FINDINGS: There is no evidence of acute fracture. A radiopaque intramedullary rod, large fixation plate and multiple fixation screws are seen within the distal left femur. No evidence of arthropathy or other focal bone abnormality. There is a large joint effusion. Moderate severity suprapatellar soft tissue swelling is also seen.  IMPRESSION: 1. Large joint effusion and moderate severity suprapatellar soft tissue swelling. 2. Prior ORIF of the distal left femur.  Electronically Signed   By: Suzen Dials M.D.   On: 10/22/2024 20:24        ASSESSMENT: Patient is a 37 year old male with past medical history of thrombocytopenia likely due to cirrhosis.  PLAN:  Thrombocytopenia Etiology likely  underlying liver disease/cirrhosis d/t alcohol  use Keep platelets above 20,000.  Hemoglobin more or less stable around 11, MCV 94.9, WBC 4.6 with differential. Transfuse platelets as needed or during surgery/arthrocentesis. Will get labs to rule out nutritional deficiencies.  Orders placed. Recommend follow-up with hematology outpatient.  Patient was agreeable.  Addendum: Patient had recent ultrasound which does showed cirrhosis.  Nutritional labs so far appear unremarkable.  Peripheral smear shows decreased platelet counts but normal morphology.  He has mild macrocytic anemia with severe thrombocytopenia.  B12 and ferritin likely elevated in the setting of chronic inflammation but iron saturations confirm he is not iron deficient.  Thank you for involving us  in this patient's care.  Please to reach out with any questions or concerns.  Delon Hope, AGNP Hematology/Oncology [facility]

## 2024-10-23 NOTE — TOC CM/SW Note (Signed)
 Transition of Care [facility]) - Inpatient Brief Assessment   Patient Details  Name: Gregory Murphy MRN: [id number] Date of Birth: January 20, 1986  [facility]) CM/SW Contact:    Lucie Lunger, LCSWA Phone Number: 10/23/2024, 10:27 AM   Clinical Narrative: Transition of Care Department [facility]) has reviewed patient and no TOC needs have been identified at this time. We will continue to monitor patient advancement through interdiciplinary progression rounds. If new patient transition needs arise, please place a TOC consult.  Transition of Care Asessment: Insurance and Status: Insurance coverage has been reviewed Patient has primary care physician: Yes Home environment has been reviewed: From home Prior level of function:: Independent Prior/Current Home Services: No current home services Social Drivers of Health Review: SDOH reviewed no interventions necessary Readmission risk has been reviewed: Yes Transition of care needs: no transition of care needs at this time

## 2024-10-23 NOTE — H&P (Addendum)
 History and Physical  Gregory Murphy FMW:[id number] DOB: 17-Jan-1986 DOA: 10/22/2024  Referring physician: Dr. Geroldine, EDP  PCP: Patient, [name]  Outpatient Specialists: GI, orthopedic surgery, general surgery. Patient coming from: Home.  Chief Complaint: Left knee pain and swelling.  HPI: Gregory Murphy is a 38 y.o. male with medical history significant for alcohol  abuse, alcoholic liver cirrhosis, chronic severe thrombocytopenia, chronic pancytopenia, history of traumatic injury to left femur requiring surgery (Has rods from left hip to his left knee), prior left knee effusion status post arthrocentesis on 07/14/2024, who presents to the ER with left knee pain and swelling x 3 days.  States since his left knee surgery he has had recurrent left knee effusion.  Unable to ambulate or bear weight on the left lower extremity due to severe left knee pain.  No fevers or chills.  Denies any recent trauma to his left knee.  Last alcoholic drink was last night.  Drinks 10-12 beers per day.  EMS was activated, and the patient was brought into the ER for further evaluation.  In the ER, vital signs are stable.  Lab studies are notable for pancytopenia with platelet count of 25,000, WBC 3.7 K, hemoglobin 11.9 K with MCV of 104.6.   X-ray of left knee revealed large joint effusion and moderate severity suprapatellar soft tissue swelling.  Prior ORIF of the distal left femur.  EDP discussed the case with hematology.  Recommended to repeat CBC in the morning and to transfuse 1 unit platelets if platelet count drops below 20,000.  Hematology will see in consultation this morning.  Admitted by [facility], hospitalist service.  The patient gives consent for platelets transfusion and arthrocentesis.  ED Course: Temperature 98.1.  BP 125/98, pulse 78, respiration rate 20, O2 saturation 92% on room air.  Review of Systems: Review of systems as noted in the HPI. All other systems reviewed and are  negative.   Past Medical History:  Diagnosis Date   Alcohol  withdrawal (HCC) 03/01/2024   Alcoholic cirrhosis of liver with ascites (HCC) 03/02/2024   Alcoholic gastritis 03/03/2024   Alcoholic hepatitis with ascites (HCC) 03/02/2024   Brain injury (HCC)    Cirrhosis with alcoholism (HCC)    Critical polytrauma 12/17/2021   Decompensated cirrhosis (HCC) 03/10/2024   Depression    Elevated AFP 03/03/2024   HCV antibody positive 03/03/2024   Insomnia    Macrocytic anemia 03/03/2024   Malnutrition of moderate degree 12/21/2021   Panic attack    Severe thrombocytopenia 03/03/2024   Splenic rupture 09/07/2023   Past Surgical History:  Procedure Laterality Date   ABDOMINAL SURGERY     CLOSED REDUCTION FINGER WITH PERCUTANEOUS PINNING Left 02/04/2024   Procedure: LEFT THUMB CLOSED REDUCTION FINGER WITH PERCUTANEOUS PINNing;  Surgeon: Arlinda Buster, MD;  Location: [facility];  Service: Orthopedics;  Laterality: Left;   DISTAL FEMUR BONE BRIDGE EXCISION Left    FEMUR CLOSED REDUCTION Left 12/17/2021   Procedure: CLOSED REDUCTION FEMORAL SHAFT;  Surgeon: Barton Drape, MD;  Location: [facility];  Service: Orthopedics;  Laterality: Left;   FOREARM SURGERY Left    x4-metal rods placed   I & D EXTREMITY Left 02/04/2024   Procedure: IRRIGATION AND DEBRIDEMENT OF LEFT THUMB;  Surgeon: Arlinda Buster, MD;  Location: [facility];  Service: Orthopedics;  Laterality: Left;   IR PARACENTESIS  03/02/2024   LEG SURGERY Right 2023   NAILBED REPAIR Left 02/04/2024   Procedure: LEFT THUMB NAILBED REPAIR;  Surgeon: Arlinda,  Anshul, MD;  Location: [facility];  Service: Orthopedics;  Laterality: Left;   ORIF FEMUR FRACTURE Left 12/20/2021   Procedure: OPEN REDUCTION INTERNAL FIXATION (ORIF) DISTAL FEMUR FRACTURE;  Surgeon: Kendal Franky SQUIBB, MD;  Location: [facility];  Service: Orthopedics;  Laterality: Left;   TIBIA IM NAIL INSERTION Bilateral 12/17/2021    Procedure: RIGHT INTRAMEDULLARY (IM) NAIL TIBIAL; RIGHT CLOSED TREATMENT OF FIBULA FRACTURE; LEFT CLOSED REDUCTION SPLINTING OF PERIPROSTHETIC  SUPRACONDYLAR FEMUR FRACTURE;  Surgeon: Barton Drape, MD;  Location: [facility];  Service: Orthopedics;  Laterality: Bilateral;   WRIST ARTHROSCOPY WITH CARPOMETACARPEL [facility]) ARTHROPLASTY Left 03/21/2022   Procedure: left wrist arthroscopy with debridement as needed;  Surgeon: Sissy Cough, MD;  Location: [facility];  Service: Orthopedics;  Laterality: Left;  just needs 60 mins for surgery   WRIST SURGERY Right    pins    Social History:  reports that he has been smoking cigarettes. He has a 7 pack-year smoking history. He has been exposed to tobacco smoke. He has never used smokeless tobacco. He reports current alcohol  use of about 21.0 standard drinks of alcohol  per week. He reports that he does not currently use drugs.   Allergies  Allergen Reactions   Latex Itching    Gloves     Family history: None reported.  Prior to Admission medications   Medication Sig Start Date End Date Taking? Authorizing Provider  chlordiazePOXIDE  (LIBRIUM ) 25 MG capsule 50mg  PO TID x 1D, then 25-50mg  PO BID X 1D, then 25-50mg  PO QD X 1D 10/09/24   Elnor Jayson LABOR, DO  Multiple Vitamins-Minerals (MULTIVITAMIN WITH MINERALS) tablet Take 1 tablet by mouth daily. 10/09/24 12/08/24  Elnor Jayson A, DO  ondansetron  (ZOFRAN ) 4 MG tablet Take 1 tablet (4 mg total) by mouth every 8 (eight) hours as needed for nausea or vomiting. 10/09/24   Elnor Jayson LABOR, DO  thiamine  (VITAMIN B-1) 100 MG tablet Take 1 tablet (100 mg total) by mouth daily. 07/17/24   Johnson, Clanford L, MD  vitamin B-12 (CYANOCOBALAMIN ) 500 MCG tablet Take 500 mcg by mouth daily.    [provider]    Physical Exam: BP (!) 125/98 (BP Location: Left Arm)   Pulse 92   Temp 98.1 F (36.7 C) (Oral)   Resp 20   Ht 5' 7 (1.702 m)   Wt 58 kg   SpO2 92%   BMI 20.03 kg/m    General: 38 y.o. year-old male well developed well nourished in no acute distress.  Alert and oriented x3. Cardiovascular: Regular rate and rhythm with no rubs or gallops.  No thyromegaly or JVD noted.  No lower extremity edema. 2/4 pulses in all 4 extremities. Respiratory: Clear to auscultation with no wheezes or rales. Good inspiratory effort. Abdomen: Soft nontender nondistended with normal bowel sounds x4 quadrants. Muskuloskeletal: No cyanosis or clubbing noted bilaterally.  Large left knee effusion. Neuro: CN II-XII intact, strength, sensation, reflexes Skin: No ulcerative lesions noted or rashes Psychiatry: Judgement and insight appear normal. Mood is appropriate for condition and setting          Labs on Admission:  Basic Metabolic Panel: Recent Labs  Lab 10/22/24 2354  NA 133*  K 3.4*  CL 97*  CO2 26  GLUCOSE 234*  BUN <5*  CREATININE 0.75  CALCIUM  7.9*   Liver Function Tests: Recent Labs  Lab 10/22/24 2354  AST 134*  ALT 52*  ALKPHOS 144*  BILITOT 2.6*  PROT 7.1  ALBUMIN  3.4*  No results for input(s): LIPASE, AMYLASE in the last 168 hours. No results for input(s): AMMONIA in the last 168 hours. CBC: Recent Labs  Lab 10/22/24 2354  WBC 3.7*  NEUTROABS 2.3  HGB 11.9*  HCT 34.0*  MCV 104.6*  PLT 25*   Cardiac Enzymes: No results for input(s): CKTOTAL, CKMB, CKMBINDEX, TROPONINI in the last 168 hours.  BNP (last 3 results) Recent Labs    03/10/24 0413  BNP 30.0    ProBNP (last 3 results) No results for input(s): PROBNP in the last 8760 hours.  CBG: No results for input(s): GLUCAP in the last 168 hours.  Radiological Exams on Admission: DG Knee Complete 4 Views Left Result Date: 10/22/2024 CLINICAL DATA:  Knee pain. EXAM: LEFT KNEE - COMPLETE 4+ VIEW COMPARISON:  None Available. FINDINGS: There is no evidence of acute fracture. A radiopaque intramedullary rod, large fixation plate and multiple fixation screws are seen  within the distal left femur. No evidence of arthropathy or other focal bone abnormality. There is a large joint effusion. Moderate severity suprapatellar soft tissue swelling is also seen. IMPRESSION: 1. Large joint effusion and moderate severity suprapatellar soft tissue swelling. 2. Prior ORIF of the distal left femur. Electronically Signed   By: Suzen Dials M.D.   On: 10/22/2024 20:24    EKG: I independently viewed the EKG done and my findings are as followed: None available at the time of this visit.  Assessment/Plan Present on Admission:  Effusion of left knee  Principal Problem:   Effusion of left knee  Large left knee effusion, POA History of recurrent hemarthrosis of the same knee History of traumatic injury to left femur requiring surgery. X-ray of left knee revealed large joint effusion and moderate severity suprapatella soft tissue swelling.  Prior ORIF of the distal left femur. IR consulted for possible arthocentesis  Alcohol  abuse with concern for withdrawal Last alcohol  intake was yesterday, 10-12 beers per day CIWA protocol Multivitamins, thiamine , and folic acid  supplement  Hyperglycemia Presented with serum glucose greater than 200 Obtain hemoglobin A1c Start short acting insulin for now  Elevated liver chemistries secondary to alcohol  abuse AST to ALT 2:1 ratio likely from alcohol  abuse Elevated alkaline phosphatase 139, T. Bili 2.6 Continue to monitor LFTs.  Alcoholic liver cirrhosis Follows with GI outpatient Recommend complete alcohol  cessation Avoid hepatotoxic agents.  Anemia of chronic disease Hemoglobin 11.9 K, appears at baseline Continue to monitor H&H  Chronic pancytopenia Likely contributed by alcohol  abuse Continue to monitor Hematology will see in consultation.   Time: 75 minutes.   DVT prophylaxis: SCDs.  Code Status: Full code.  Family Communication: None at bedside.  Disposition Plan: Admitted to telemetry  unit.  Consults called: IR, Hematology consulted by EDP.  Admission status: Observation status.   Status is: Observation    Terry LOISE Hurst MD [facility] Pager [phone]  If 7PM-7AM, please contact night-coverage [email or web address] Password [email or web address]  10/23/2024, 5:48 AM

## 2024-10-23 NOTE — Progress Notes (Signed)
 ASSUMPTION OF CARE NOTE   10/23/2024 5:26 PM  Gregory Murphy was seen and examined.  The H&P by the admitting provider, orders, imaging was reviewed.  Please see new orders.  Will continue to follow.  I have reached out to [facility] and to Dr. Onesimo with orthopedics.  [facility] says they cannot do procedure with platelet count less than 40K and to reach out to ortho.  I reached out to Dr. Onesimo.  He feels like the platelets are too low at this time to safely do arthrocentesis and feels like the low platelets likely the cause of his recurrent hemarthrosis condition.  Hematology consulted to assist with management of low platelets.  Recheck labs in AM.  Topical/supportive management for now.   Vitals:   10/23/24 1550 10/23/24 1600  BP: (!) 134/91   Pulse: (!) 104 (!) 105  Resp:    Temp: 98.9 F (37.2 C)   SpO2: 93%     Results for orders placed or performed during the hospital encounter of 10/22/24  Basic metabolic panel   Collection Time: 10/22/24 11:54 PM  Result Value Ref Range   Sodium 133 (L) 135 - 145 mmol/L   Potassium 3.4 (L) 3.5 - 5.1 mmol/L   Chloride 97 (L) 98 - 111 mmol/L   CO2 26 22 - 32 mmol/L   Glucose, Bld 234 (H) 70 - 99 mg/dL   BUN <5 (L) 6 - 20 mg/dL   Creatinine, Ser 9.24 0.61 - 1.24 mg/dL   Calcium  7.9 (L) 8.9 - 10.3 mg/dL   GFR, Estimated >39 >39 mL/min   Anion gap 10 5 - 15  CBC with Differential   Collection Time: 10/22/24 11:54 PM  Result Value Ref Range   WBC 3.7 (L) 4.0 - 10.5 K/uL   RBC 3.25 (L) 4.22 - 5.81 MIL/uL   Hemoglobin 11.9 (L) 13.0 - 17.0 g/dL   HCT 65.9 (L) 60.9 - 47.9 %   MCV 104.6 (H) 80.0 - 100.0 fL   MCH 36.6 (H) 26.0 - 34.0 pg   MCHC 35.0 30.0 - 36.0 g/dL   RDW 87.2 88.4 - 84.4 %   Platelets 25 (LL) 150 - 400 K/uL   nRBC 0.0 0.0 - 0.2 %   Neutrophils Relative % 62 %   Neutro Abs 2.3 1.7 - 7.7 K/uL   Lymphocytes Relative 27 %   Lymphs Abs 1.0 0.7 - 4.0 K/uL   Monocytes Relative 8 %   Monocytes Absolute 0.3 0.1 - 1.0 K/uL    Eosinophils Relative 1 %   Eosinophils Absolute 0.0 0.0 - 0.5 K/uL   Basophils Relative 2 %   Basophils Absolute 0.1 0.0 - 0.1 K/uL   WBC Morphology MORPHOLOGY UNREMARKABLE    RBC Morphology MORPHOLOGY UNREMARKABLE    Immature Granulocytes 0 %   Abs Immature Granulocytes 0.01 0.00 - 0.07 K/uL  Protime-INR   Collection Time: 10/22/24 11:54 PM  Result Value Ref Range   Prothrombin Time 20.2 (H) 11.4 - 15.2 seconds   INR 1.6 (H) 0.8 - 1.2  Hepatic function panel   Collection Time: 10/22/24 11:54 PM  Result Value Ref Range   Total Protein 7.1 6.5 - 8.1 g/dL   Albumin  3.4 (L) 3.5 - 5.0 g/dL   AST 865 (H) 15 - 41 U/L   ALT 52 (H) 0 - 44 U/L   Alkaline Phosphatase 144 (H) 38 - 126 U/L   Total Bilirubin 2.6 (H) 0.0 - 1.2 mg/dL   Bilirubin, Direct 1.8 (H)  0.0 - 0.2 mg/dL   Indirect Bilirubin 0.8 0.3 - 0.9 mg/dL  HIV Antibody (routine testing w rflx)   Collection Time: 10/22/24 11:54 PM  Result Value Ref Range   HIV Screen 4th Generation wRfx Non Reactive Non Reactive  Hemoglobin A1c   Collection Time: 10/22/24 11:54 PM  Result Value Ref Range   Hgb A1c MFr Bld 4.2 (L) 4.8 - 5.6 %   Mean Plasma Glucose 73.84 mg/dL  CBC with Differential/Platelet   Collection Time: 10/23/24  3:44 AM  Result Value Ref Range   WBC 4.6 4.0 - 10.5 K/uL   RBC 3.08 (L) 4.22 - 5.81 MIL/uL   Hemoglobin 11.2 (L) 13.0 - 17.0 g/dL   HCT 67.6 (L) 60.9 - 47.9 %   MCV 104.9 (H) 80.0 - 100.0 fL   MCH 36.4 (H) 26.0 - 34.0 pg   MCHC 34.7 30.0 - 36.0 g/dL   RDW 87.0 88.4 - 84.4 %   Platelets 26 (LL) 150 - 400 K/uL   nRBC 0.0 0.0 - 0.2 %   Neutrophils Relative % 65 %   Neutro Abs 2.9 1.7 - 7.7 K/uL   Lymphocytes Relative 27 %   Lymphs Abs 1.2 0.7 - 4.0 K/uL   Monocytes Relative 7 %   Monocytes Absolute 0.3 0.1 - 1.0 K/uL   Eosinophils Relative 0 %   Eosinophils Absolute 0.0 0.0 - 0.5 K/uL   Basophils Relative 1 %   Basophils Absolute 0.1 0.0 - 0.1 K/uL   WBC Morphology MORPHOLOGY UNREMARKABLE    RBC  Morphology MORPHOLOGY UNREMARKABLE    Immature Granulocytes 0 %   Abs Immature Granulocytes 0.01 0.00 - 0.07 K/uL  Comprehensive metabolic panel   Collection Time: 10/23/24  3:44 AM  Result Value Ref Range   Sodium 135 135 - 145 mmol/L   Potassium 3.7 3.5 - 5.1 mmol/L   Chloride 99 98 - 111 mmol/L   CO2 27 22 - 32 mmol/L   Glucose, Bld 162 (H) 70 - 99 mg/dL   BUN <5 (L) 6 - 20 mg/dL   Creatinine, Ser 9.10 0.61 - 1.24 mg/dL   Calcium  7.7 (L) 8.9 - 10.3 mg/dL   Total Protein 6.7 6.5 - 8.1 g/dL   Albumin  3.2 (L) 3.5 - 5.0 g/dL   AST 882 (H) 15 - 41 U/L   ALT 47 (H) 0 - 44 U/L   Alkaline Phosphatase 139 (H) 38 - 126 U/L   Total Bilirubin 2.6 (H) 0.0 - 1.2 mg/dL   GFR, Estimated >39 >39 mL/min   Anion gap 9 5 - 15  Magnesium    Collection Time: 10/23/24  3:44 AM  Result Value Ref Range   Magnesium  1.9 1.7 - 2.4 mg/dL  Phosphorus   Collection Time: 10/23/24  3:44 AM  Result Value Ref Range   Phosphorus 3.7 2.5 - 4.6 mg/dL  Glucose, capillary   Collection Time: 10/23/24  7:13 AM  Result Value Ref Range   Glucose-Capillary 147 (H) 70 - 99 mg/dL  Glucose, capillary   Collection Time: 10/23/24 11:18 AM  Result Value Ref Range   Glucose-Capillary 127 (H) 70 - 99 mg/dL  Glucose, capillary   Collection Time: 10/23/24  3:49 PM  Result Value Ref Range   Glucose-Capillary 100 (H) 70 - 99 mg/dL  Glucose, capillary   Collection Time: 10/23/24  4:54 PM  Result Value Ref Range   Glucose-Capillary 112 (H) 70 - 99 mg/dL   Comment 1 Notify RN    Comment 2  Document in Chart    Prolonged service time: 38 mins  KYM Louder, MD [facility]   10/22/2024  8:03 PM How to contact the [facility] Attending or Consulting provider 7A - 7P or covering provider during after hours 7P -7A, for this patient?  Check the care team in [facility] and look for a) attending/consulting [facility] provider listed and b) the [facility] team listed Log into [email or web address] and use [facility] universal password to access. If you do  not have the password, please contact the hospital operator. Locate the [facility] provider you are looking for under [facility] and page to a number that you can be directly reached. If you still have difficulty reaching the provider, please page the [facility] (Director on Call) for the Hospitalists listed on amion for assistance.

## 2024-10-24 DIAGNOSIS — F32A Depression, unspecified: Secondary | ICD-10-CM | POA: Diagnosis present

## 2024-10-24 DIAGNOSIS — K7011 Alcoholic hepatitis with ascites: Secondary | ICD-10-CM | POA: Diagnosis present

## 2024-10-24 DIAGNOSIS — F1010 Alcohol abuse, uncomplicated: Secondary | ICD-10-CM

## 2024-10-24 DIAGNOSIS — R7689 Other specified abnormal immunological findings in serum: Secondary | ICD-10-CM

## 2024-10-24 DIAGNOSIS — E440 Moderate protein-calorie malnutrition: Secondary | ICD-10-CM | POA: Diagnosis present

## 2024-10-24 DIAGNOSIS — F17210 Nicotine dependence, cigarettes, uncomplicated: Secondary | ICD-10-CM | POA: Diagnosis present

## 2024-10-24 DIAGNOSIS — D696 Thrombocytopenia, unspecified: Secondary | ICD-10-CM | POA: Diagnosis not present

## 2024-10-24 DIAGNOSIS — K7030 Alcoholic cirrhosis of liver without ascites: Secondary | ICD-10-CM | POA: Diagnosis present

## 2024-10-24 DIAGNOSIS — M25562 Pain in left knee: Secondary | ICD-10-CM | POA: Diagnosis present

## 2024-10-24 DIAGNOSIS — Z79899 Other long term (current) drug therapy: Secondary | ICD-10-CM | POA: Diagnosis not present

## 2024-10-24 DIAGNOSIS — D638 Anemia in other chronic diseases classified elsewhere: Secondary | ICD-10-CM | POA: Diagnosis present

## 2024-10-24 DIAGNOSIS — M25462 Effusion, left knee: Secondary | ICD-10-CM | POA: Diagnosis not present

## 2024-10-24 DIAGNOSIS — R441 Visual hallucinations: Secondary | ICD-10-CM | POA: Diagnosis present

## 2024-10-24 DIAGNOSIS — Z91040 Latex allergy status: Secondary | ICD-10-CM | POA: Diagnosis not present

## 2024-10-24 DIAGNOSIS — K7290 Hepatic failure, unspecified without coma: Secondary | ICD-10-CM

## 2024-10-24 DIAGNOSIS — D61818 Other pancytopenia: Secondary | ICD-10-CM

## 2024-10-24 DIAGNOSIS — E559 Vitamin D deficiency, unspecified: Secondary | ICD-10-CM | POA: Diagnosis present

## 2024-10-24 DIAGNOSIS — F410 Panic disorder [episodic paroxysmal anxiety] without agoraphobia: Secondary | ICD-10-CM | POA: Diagnosis present

## 2024-10-24 DIAGNOSIS — G9341 Metabolic encephalopathy: Secondary | ICD-10-CM | POA: Diagnosis present

## 2024-10-24 DIAGNOSIS — Z720 Tobacco use: Secondary | ICD-10-CM

## 2024-10-24 DIAGNOSIS — R000 Tachycardia, unspecified: Secondary | ICD-10-CM | POA: Diagnosis not present

## 2024-10-24 DIAGNOSIS — K625 Hemorrhage of anus and rectum: Secondary | ICD-10-CM | POA: Diagnosis not present

## 2024-10-24 DIAGNOSIS — F10239 Alcohol dependence with withdrawal, unspecified: Secondary | ICD-10-CM | POA: Diagnosis present

## 2024-10-24 DIAGNOSIS — R739 Hyperglycemia, unspecified: Secondary | ICD-10-CM | POA: Diagnosis present

## 2024-10-24 DIAGNOSIS — K7460 Unspecified cirrhosis of liver: Secondary | ICD-10-CM

## 2024-10-24 DIAGNOSIS — D539 Nutritional anemia, unspecified: Secondary | ICD-10-CM | POA: Diagnosis present

## 2024-10-24 DIAGNOSIS — Z6820 Body mass index (BMI) 20.0-20.9, adult: Secondary | ICD-10-CM | POA: Diagnosis not present

## 2024-10-24 DIAGNOSIS — M25062 Hemarthrosis, left knee: Secondary | ICD-10-CM | POA: Diagnosis present

## 2024-10-24 DIAGNOSIS — F1120 Opioid dependence, uncomplicated: Secondary | ICD-10-CM | POA: Diagnosis present

## 2024-10-24 DIAGNOSIS — D6959 Other secondary thrombocytopenia: Secondary | ICD-10-CM | POA: Diagnosis present

## 2024-10-24 LAB — CBC
HCT: 32.9 % — ABNORMAL LOW (ref 39.0–52.0)
Hemoglobin: 11.2 g/dL — ABNORMAL LOW (ref 13.0–17.0)
MCH: 36.7 pg — ABNORMAL HIGH (ref 26.0–34.0)
MCHC: 34 g/dL (ref 30.0–36.0)
MCV: 107.9 fL — ABNORMAL HIGH (ref 80.0–100.0)
Platelets: 22 K/uL — CL (ref 150–400)
RBC: 3.05 MIL/uL — ABNORMAL LOW (ref 4.22–5.81)
RDW: 13.2 % (ref 11.5–15.5)
WBC: 4 K/uL (ref 4.0–10.5)
nRBC: 0 % (ref 0.0–0.2)

## 2024-10-24 LAB — VITAMIN D 25 HYDROXY (VIT D DEFICIENCY, FRACTURES): Vit D, 25-Hydroxy: 11.13 ng/mL — ABNORMAL LOW (ref 30–100)

## 2024-10-24 LAB — FERRITIN: Ferritin: 1285 ng/mL — ABNORMAL HIGH (ref 24–336)

## 2024-10-24 LAB — IRON AND TIBC
Iron: 88 ug/dL (ref 45–182)
Saturation Ratios: 43 % — ABNORMAL HIGH (ref 17.9–39.5)
TIBC: 207 ug/dL — ABNORMAL LOW (ref 250–450)
UIBC: 119 ug/dL

## 2024-10-24 LAB — VITAMIN B12: Vitamin B-12: 1749 pg/mL — ABNORMAL HIGH (ref 180–914)

## 2024-10-24 LAB — TECHNOLOGIST SMEAR REVIEW: Plt Morphology: DECREASED

## 2024-10-24 LAB — GLUCOSE, CAPILLARY
Glucose-Capillary: 90 mg/dL (ref 70–99)
Glucose-Capillary: 112 mg/dL — ABNORMAL HIGH (ref 70–99)
Glucose-Capillary: 167 mg/dL — ABNORMAL HIGH (ref 70–99)

## 2024-10-24 LAB — FOLATE: Folate: 6 ng/mL (ref >5.9)

## 2024-10-24 MED ORDER — CHLORDIAZEPOXIDE HCL 5 MG PO CAPS
10.0000 mg | ORAL_CAPSULE | Freq: Three times a day (TID) | ORAL | Status: AC
  Administered 2024-10-24 – 2024-10-25 (×3): 10 mg via ORAL
  Filled 2024-10-24 (×3): qty 2

## 2024-10-24 MED ORDER — KETOROLAC TROMETHAMINE 30 MG/ML IJ SOLN
30.0000 mg | Freq: Once | INTRAMUSCULAR | Status: AC
  Administered 2024-10-24: 30 mg via INTRAVENOUS
  Filled 2024-10-24: qty 1

## 2024-10-24 MED ORDER — VITAMIN D (ERGOCALCIFEROL) 1.25 MG (50000 UNIT) PO CAPS
50000.0000 [IU] | ORAL_CAPSULE | ORAL | Status: DC
  Administered 2024-10-25: 50000 [IU] via ORAL
  Filled 2024-10-24: qty 1

## 2024-10-24 MED ORDER — CHLORDIAZEPOXIDE HCL 5 MG PO CAPS
5.0000 mg | ORAL_CAPSULE | Freq: Three times a day (TID) | ORAL | Status: AC
  Administered 2024-10-25 – 2024-10-27 (×6): 5 mg via ORAL
  Filled 2024-10-24 (×7): qty 1

## 2024-10-24 NOTE — Plan of Care (Signed)

## 2024-10-24 NOTE — Hospital Course (Signed)
 38 y.o. male with medical history significant for alcohol  abuse, alcoholic liver cirrhosis, chronic severe thrombocytopenia, chronic pancytopenia, history of traumatic injury to left femur requiring surgery (Has rods from left hip to his left knee), prior left knee effusion status post arthrocentesis on 07/14/2024, who presents to the ER with left knee pain and swelling x 3 days.  States since his left knee surgery he has had recurrent left knee effusion.  Unable to ambulate or bear weight on the left lower extremity due to severe left knee pain.  No fevers or chills.  Denies any recent trauma to his left knee.  Last alcoholic drink was last night.  Drinks 10-12 beers per day.  EMS was activated, and the patient was brought into the ER for further evaluation.   In the ER, vital signs are stable.  Lab studies are notable for pancytopenia with platelet count of 25,000, WBC 3.7 K, hemoglobin 11.9 K with MCV of 104.6.   X-ray of left knee revealed large joint effusion and moderate severity suprapatellar soft tissue swelling.  Prior ORIF of the distal left femur.   EDP discussed the case with hematology.  Recommended to repeat CBC in the morning and to transfuse 1 unit platelets if platelet count drops below 20,000.  Hematology will see in consultation this morning.  Admitted by [facility], hospitalist service.  The patient gives consent for platelets transfusion and arthrocentesis.

## 2024-10-24 NOTE — Progress Notes (Addendum)
 PROGRESS NOTE   Gregory Murphy  FMW:[id number] DOB: [birth year] DOA: 10/22/2024 PCP: Patient, [name]   Chief Complaint  Patient presents with   Knee Pain   Level of care: Telemetry  Brief Admission History:  38 y.o. male with medical history significant for alcohol  abuse, alcoholic liver cirrhosis, chronic severe thrombocytopenia, chronic pancytopenia, history of traumatic injury to left femur requiring surgery (Has rods from left hip to his left knee), prior left knee effusion status post arthrocentesis on 07/14/2024, who presents to the ER with left knee pain and swelling x 3 days.  States since his left knee surgery he has had recurrent left knee effusion.  Unable to ambulate or bear weight on the left lower extremity due to severe left knee pain.  No fevers or chills.  Denies any recent trauma to his left knee.  Last alcoholic drink was last night.  Drinks 10-12 beers per day.  EMS was activated, and the patient was brought into the ER for further evaluation.   In the ER, vital signs are stable.  Lab studies are notable for pancytopenia with platelet count of 25,000, WBC 3.7 K, hemoglobin 11.9 K with MCV of 104.6.   X-ray of left knee revealed large joint effusion and moderate severity suprapatellar soft tissue swelling.  Prior ORIF of the distal left femur.   EDP discussed the case with hematology.  Recommended to repeat CBC in the morning and to transfuse 1 unit platelets if platelet count drops below 20,000.  Hematology will see in consultation this morning.  Admitted by [facility], hospitalist service.  The patient gives consent for platelets transfusion and arthrocentesis.   Assessment and Plan:  Large left knee effusion History of recurrent hemarthrosis of the same knee History of traumatic injury to left femur requiring surgery. X-ray of left knee revealed large joint effusion and moderate severity suprapatella soft tissue swelling.  Prior ORIF of the distal left femur. Per  admitting MD IR was consulted for possible arthocentesis, they suggested I call orthopedics, I discussed with Dr. Onesimo, at this time platelets remain too low, could consider procedure if platelets improve.  Hematology consulted and said that pt could be transfused platelets prior to procedure.  Dr. Onesimo made aware of hematologist recommendation.  For now supportive care with topical ice packs. PT eval.    Alcohol  abuse with concern for withdrawal Last alcohol  intake was yesterday, 10-12 beers per day CIWA protocol Multivitamins, thiamine , and folic acid  supplement Started oral librium  taper to try to avoid acute severe alcohol  withdrawal    Hyperglycemia-- resolved Presented with serum glucose greater than 200 Obtain hemoglobin A1c -- 4.2%  DC insulin orders   Elevated liver chemistries secondary to alcohol  abuse AST to ALT 2:1 ratio likely from alcohol  abuse Elevated alkaline phosphatase 139, T. Bili 2.6 Continue to monitor LFTs.  Severe Vitamin D  deficiency Started Drisdol  50k I.U. weekly   Alcoholic liver cirrhosis Follows with GI outpatient Recommend complete alcohol  cessation Avoid hepatotoxic agents.   Anemia of chronic disease Hemoglobin 11.9 K, appears at baseline Continue to monitor H&H   Chronic pancytopenia Likely contributed by alcohol  abuse, advanced liver disease Continue to monitor Hematology consultation  DVT prophylaxis: SCDs Code Status: Full  Family Communication:  Disposition: TBD    Consultants:  Orthopedics Hematology TOC  Procedures:   Antimicrobials:    Subjective: Pt reports his shakes are some better today.  He is still having significant left knee pain and difficulty with ambulation and he understands that  his platelets remain dangerously low and not yet able to have procedure.   Objective: Vitals:   10/24/24 0543 10/24/24 1128 10/24/24 1247 10/24/24 1603  BP: 139/89 132/88 131/87 (!) 141/86  Pulse: (!) 111 (!) 105 94 (!) 120   Resp: 14     Temp: 99.4 F (37.4 C) 98.9 F (37.2 C) 99 F (37.2 C) 99 F (37.2 C)  TempSrc: Oral Oral Oral Oral  SpO2: 92% 95% 94% 95%  Weight:      Height:        Intake/Output Summary (Last 24 hours) at 10/24/2024 1616 Last data filed at 10/24/2024 1100 Gross per 24 hour  Intake 600 ml  Output --  Net 600 ml   Filed Weights   10/22/24 2000  Weight: 58 kg   Examination:  General exam: Appears chronically ill, calm and uncomfortable  Respiratory system: Clear to auscultation. Respiratory effort normal. Cardiovascular system: normal S1 & S2 heard. No JVD, murmurs, rubs, gallops or clicks. No pedal edema. Gastrointestinal system: Abdomen is mildly distended, soft and nontender. No organomegaly or masses felt. Normal bowel sounds heard. Central nervous system: Alert and oriented. No focal neurological deficits. Extremities: large left knee effusion with tenderness to light palpation and limited ROM due to pain.  Skin: No rashes, lesions or ulcers. Psychiatry: Judgement and insight appear normal. Mood & affect appropriate.   Data Reviewed: I have personally reviewed following labs and imaging studies  CBC: Recent Labs  Lab 10/22/24 2354 10/23/24 0344 10/24/24 0426  WBC 3.7* 4.6 4.0  NEUTROABS 2.3 2.9  --   HGB 11.9* 11.2* 11.2*  HCT 34.0* 32.3* 32.9*  MCV 104.6* 104.9* 107.9*  PLT 25* 26* 22*    Basic Metabolic Panel: Recent Labs  Lab 10/22/24 2354 10/23/24 0344  NA 133* 135  K 3.4* 3.7  CL 97* 99  CO2 26 27  GLUCOSE 234* 162*  BUN <5* <5*  CREATININE 0.75 0.89  CALCIUM  7.9* 7.7*  MG  --  1.9  PHOS  --  3.7    CBG: Recent Labs  Lab 10/23/24 1549 10/23/24 1654 10/23/24 1952 10/24/24 0724 10/24/24 1106  GLUCAP 100* 112* 86 90 112*    No results found for this or any previous visit (from the past 240 hours).   Radiology Studies: US  Abdomen Limited RUQ (LIVER/GB) Result Date: 10/23/2024 CLINICAL DATA:  Thrombocytopenia EXAM: ULTRASOUND  ABDOMEN LIMITED RIGHT UPPER QUADRANT COMPARISON:  Ultrasound 10/08/2024, CT 10/08/2024 FINDINGS: Gallbladder: Distended gallbladder with small stones. Normal wall thickness. No Murphy indicated. Common bile duct: Diameter: 5 mm Liver: Echogenic. Nodular surface consistent with history of cirrhosis. Portal vein is patent on color Doppler imaging with normal direction of blood flow towards the liver. Other: None. IMPRESSION: 1. Distended gallbladder with small stones. No sonographic evidence for acute cholecystitis. 2. Echogenic liver with nodular surface consistent with history of cirrhosis. Electronically Signed   By: Luke Bun M.D.   On: 10/23/2024 22:32   DG Knee Complete 4 Views Left Result Date: 10/22/2024 CLINICAL DATA:  Knee pain. EXAM: LEFT KNEE - COMPLETE 4+ VIEW COMPARISON:  None Available. FINDINGS: There is no evidence of acute fracture. A radiopaque intramedullary rod, large fixation plate and multiple fixation screws are seen within the distal left femur. No evidence of arthropathy or other focal bone abnormality. There is a large joint effusion. Moderate severity suprapatellar soft tissue swelling is also seen. IMPRESSION: 1. Large joint effusion and moderate severity suprapatellar soft tissue swelling. 2. Prior ORIF of the  distal left femur. Electronically Signed   By: Suzen Dials M.D.   On: 10/22/2024 20:24    Scheduled Meds:  chlordiazePOXIDE   10 mg Oral TID   Followed by   NOREEN ON 10/25/2024] chlordiazePOXIDE   5 mg Oral TID   folic acid   1 mg Oral Daily   insulin aspart  0-5 Units Subcutaneous QHS   insulin aspart  0-9 Units Subcutaneous TID WC   multivitamin with minerals  1 tablet Oral Daily   thiamine   100 mg Oral Daily   Or   thiamine   100 mg Intravenous Daily   [START ON 10/25/2024] Vitamin D  (Ergocalciferol )  50,000 Units Oral Q7 days   Continuous Infusions:   LOS: 0 days   Time spent: 55 mins  Callum Wolf Vicci, MD How to contact the [facility] Attending or  Consulting provider 7A - 7P or covering provider during after hours 7P -7A, for this patient?  Check the care team in [facility] and look for a) attending/consulting TRH provider listed and b) the TRH team listed Log into [email or web address] to find provider on call.  Locate the TRH provider you are looking for under [facility] and page to a number that you can be directly reached. If you still have difficulty reaching the provider, please page the [facility] (Director on Call) for the Hospitalists listed on amion for assistance.  10/24/2024, 4:16 PM

## 2024-10-25 DIAGNOSIS — M25462 Effusion, left knee: Secondary | ICD-10-CM | POA: Diagnosis not present

## 2024-10-25 MED ORDER — LORAZEPAM 2 MG/ML IJ SOLN: 2.0000 mg | INTRAMUSCULAR | Status: DC

## 2024-10-25 MED ORDER — MORPHINE SULFATE (PF) 2 MG/ML IV SOLN
2.0000 mg | Freq: Once | INTRAVENOUS | Status: AC
  Administered 2024-10-25: 2 mg via INTRAVENOUS
  Filled 2024-10-25: qty 1

## 2024-10-25 MED ORDER — CHLORHEXIDINE GLUCONATE CLOTH 2 % EX PADS
6.0000 | MEDICATED_PAD | Freq: Every day | CUTANEOUS | Status: DC
  Administered 2024-10-26 – 2024-10-28 (×3): 6 via TOPICAL

## 2024-10-25 MED ORDER — SODIUM CHLORIDE 0.9 % IV BOLUS
250.0000 mL | INTRAVENOUS | Status: AC
  Administered 2024-10-25: 250 mL via INTRAVENOUS

## 2024-10-25 MED ORDER — LACTATED RINGERS IV SOLN: INTRAVENOUS | Status: AC

## 2024-10-25 MED ORDER — DEXMEDETOMIDINE HCL IN NACL 400 MCG/100ML IV SOLN
0.0000 ug/kg/h | INTRAVENOUS | Status: DC
  Administered 2024-10-26 (×2): 0.8 ug/kg/h via INTRAVENOUS
  Administered 2024-10-27: 1 ug/kg/h via INTRAVENOUS
  Administered 2024-10-27: 0.6 ug/kg/h via INTRAVENOUS
  Filled 2024-10-25 (×3): qty 100

## 2024-10-25 MED ORDER — LORAZEPAM 2 MG/ML IJ SOLN
1.0000 mg | INTRAMUSCULAR | Status: DC | PRN
  Administered 2024-10-27: 2 mg via INTRAVENOUS
  Administered 2024-10-28: 1 mg via INTRAVENOUS
  Administered 2024-10-29 (×2): 2 mg via INTRAVENOUS
  Administered 2024-10-29 (×2): 1 mg via INTRAVENOUS
  Administered 2024-10-30 (×2): 2 mg via INTRAVENOUS
  Filled 2024-10-25 (×10): qty 1

## 2024-10-25 MED ORDER — DEXMEDETOMIDINE HCL IN NACL 400 MCG/100ML IV SOLN
INTRAVENOUS | Status: AC
Start: 2024-10-25 — End: 2024-10-25
  Administered 2024-10-25: 0.1 ug/kg/h via INTRAVENOUS
  Filled 2024-10-25: qty 100

## 2024-10-25 NOTE — Progress Notes (Signed)
 PROGRESS NOTE   Gregory Murphy  FMW:[id number] DOB: 1986/03/01 DOA: 10/22/2024 PCP: Patient, [name]   Chief Complaint  Patient presents with   Knee Pain   Level of care: Telemetry  Subjective: The patient was seen and examined this morning, hemodynamically stable still complaining of the left knee pain and swelling.  Platelets 2 low at 22,000 today-no signs of bleeding   Brief Admission History:  38 y.o. male with medical history significant for alcohol  abuse, alcoholic liver cirrhosis, chronic severe thrombocytopenia, chronic pancytopenia, history of traumatic injury to left femur requiring surgery (Has rods from left hip to his left knee), prior left knee effusion status post arthrocentesis on 07/14/2024, who presents to the ER with left knee pain and swelling x 3 days.  States since his left knee surgery he has had recurrent left knee effusion.  Unable to ambulate or bear weight on the left lower extremity due to severe left knee pain.  No fevers or chills.  Denies any recent trauma to his left knee.  Last alcoholic drink was last night.  Drinks 10-12 beers per day.  EMS was activated, and the patient was brought into the ER for further evaluation.   In the ER, vital signs are stable.  Lab studies are notable for pancytopenia with platelet count of 25,000, WBC 3.7 K, hemoglobin 11.9 K with MCV of 104.6.   X-ray of left knee revealed large joint effusion and moderate severity suprapatellar soft tissue swelling.  Prior ORIF of the distal left femur.   EDP discussed the case with hematology.  Recommended to repeat CBC in the morning and to transfuse 1 unit platelets if platelet count drops below 20,000.  Hematology will see in consultation this morning.  Admitted by [facility], hospitalist service.  The patient gives consent for platelets transfusion and arthrocentesis.   Assessment and Plan:  Large left knee effusion History of recurrent hemarthrosis of the same knee  -Still  complaining of left knee pain edema left erythema or warmth  History of traumatic injury to left femur requiring surgery. X-ray of left knee revealed large joint effusion and moderate severity suprapatella soft tissue swelling.   Prior ORIF of the distal left femur. Per admitting MD IR was consulted for possible arthocentesis, they suggested I call orthopedics, it was discussed with Dr. Onesimo, at this time platelets remain too low, could consider procedure if platelets improve.   Hematology consulted and said that pt could be transfused platelets prior to procedure.  Dr. Onesimo made aware of hematologist recommendation.  For now supportive care with topical ice packs. PT eval.    Alcohol  abuse with concern for withdrawal -Remained hemodynamically stable Last alcohol  intake was yesterday, 10-12 beers per day CIWA protocol thiamine , and folic acid  supplement Started oral librium  taper to try to avoid acute severe alcohol  withdrawal    Hyperglycemia-- resolved Presented with serum glucose greater than 200 Obtain hemoglobin A1c -- 4.2%  DC insulin orders   Elevated liver chemistries secondary to alcohol  abuse AST to ALT 2:1 ratio likely from alcohol  abuse Elevated alkaline phosphatase 139, T. Bili 2.6 Continue to monitor LFTs.  Severe Vitamin D  deficiency Started Drisdol  50k I.U. weekly   Alcoholic liver cirrhosis Follows with GI outpatient Recommend complete alcohol  cessation Avoid hepatotoxic agents.   Anemia of chronic disease Hemoglobin 11.9 >> 11.2 K, appears at baseline Continue to monitor H&H   Chronic pancytopenia Likely contributed by alcohol  abuse, advanced liver disease Continue to monitor Hematology consultation  Latest Ref Rng & Units 10/24/2024    4:26 AM 10/23/2024    3:44 AM 10/22/2024   11:54 PM  CBC  WBC 4.0 - 10.5 K/uL 4.0  4.6  3.7   Hemoglobin 13.0 - 17.0 g/dL 88.7  88.7  88.0   Hematocrit 39.0 - 52.0 % 32.9  32.3  34.0   Platelets 150 - 400 K/uL  22  26  25        DVT prophylaxis: SCDs Code Status: Full  Family Communication:  Disposition: TBD    Consultants:  Orthopedics Hematology TOC  Procedures:   Antimicrobials:     Objective: Vitals:   10/24/24 1603 10/24/24 1701 10/24/24 1934 10/25/24 0318  BP: (!) 141/86 135/74 122/76 128/78  Pulse: (!) 120 96 94 82  Resp:   16 17  Temp: 99 F (37.2 C) 98.9 F (37.2 C) 98.6 F (37 C) 98.4 F (36.9 C)  TempSrc: Oral Oral Oral Oral  SpO2: 95% 95% 96% 95%  Weight:      Height:        Intake/Output Summary (Last 24 hours) at 10/25/2024 0951 Last data filed at 10/25/2024 0516 Gross per 24 hour  Intake 840 ml  Output --  Net 840 ml   Filed Weights   10/22/24 2000  Weight: 58 kg   Examination:       General:  AAO x 3,  cooperative, no distress;   HEENT:  Normocephalic, PERRL, otherwise with in Normal limits   Neuro:  CNII-XII intact. , normal motor and sensation, reflexes intact   Lungs:   Clear to auscultation BL, Respirations unlabored,  No wheezes / crackles  Cardio:    S1/S2, RRR, No murmure, No Rubs or Gallops   Abdomen:  Soft, non-tender, bowel sounds active all four quadrants, no guarding or peritoneal signs.  Muscular  skeletal:  Left knee pain with edema, therefore limited range of motion, tenderness negative any erythema-warmth Limited exam -global generalized weaknesses - in bed, able to move all 4 extremities,   2+ pulses,  symmetric, No pitting edema  Skin:  Dry, warm to touch, negative for any Rashes,  Wounds: Please see nursing documentation           Data Reviewed: I have personally reviewed following labs and imaging studies  CBC: Recent Labs  Lab 10/22/24 2354 10/23/24 0344 10/24/24 0426  WBC 3.7* 4.6 4.0  NEUTROABS 2.3 2.9  --   HGB 11.9* 11.2* 11.2*  HCT 34.0* 32.3* 32.9*  MCV 104.6* 104.9* 107.9*  PLT 25* 26* 22*    Basic Metabolic Panel: Recent Labs  Lab 10/22/24 2354 10/23/24 0344  NA 133* 135  K 3.4* 3.7  CL  97* 99  CO2 26 27  GLUCOSE 234* 162*  BUN <5* <5*  CREATININE 0.75 0.89  CALCIUM  7.9* 7.7*  MG  --  1.9  PHOS  --  3.7    CBG: Recent Labs  Lab 10/23/24 1654 10/23/24 1952 10/24/24 0724 10/24/24 1106 10/24/24 1611  GLUCAP 112* 86 90 112* 167*    No results found for this or any previous visit (from the past 240 hours).   Radiology Studies: US  Abdomen Limited RUQ (LIVER/GB) Result Date: 10/23/2024 CLINICAL DATA:  Thrombocytopenia EXAM: ULTRASOUND ABDOMEN LIMITED RIGHT UPPER QUADRANT COMPARISON:  Ultrasound 10/08/2024, CT 10/08/2024 FINDINGS: Gallbladder: Distended gallbladder with small stones. Normal wall thickness. No Murphy indicated. Common bile duct: Diameter: 5 mm Liver: Echogenic. Nodular surface consistent with history of cirrhosis. Portal vein is patent on  color Doppler imaging with normal direction of blood flow towards the liver. Other: None. IMPRESSION: 1. Distended gallbladder with small stones. No sonographic evidence for acute cholecystitis. 2. Echogenic liver with nodular surface consistent with history of cirrhosis. Electronically Signed   By: Luke Bun M.D.   On: 10/23/2024 22:32    Scheduled Meds:  chlordiazePOXIDE   5 mg Oral TID   folic acid   1 mg Oral Daily   multivitamin with minerals  1 tablet Oral Daily   thiamine   100 mg Oral Daily   Or   thiamine   100 mg Intravenous Daily   Vitamin D  (Ergocalciferol )  50,000 Units Oral Q7 days   Continuous Infusions:   LOS: 1 day   Time spent: 55 mins  Adriana DELENA Grams, MD How to contact the [facility] Attending or Consulting provider 7A - 7P or covering provider during after hours 7P -7A, for this patient?  Check the care team in [facility] and look for a) attending/consulting TRH provider listed and b) the TRH team listed Log into [email or web address] to find provider on call.  Locate the TRH provider you are looking for under [facility] and page to a number that you can be directly reached. If you still have  difficulty reaching the provider, please page the [facility] (Director on Call) for the Hospitalists listed on amion for assistance.  10/25/2024, 9:51 AM

## 2024-10-25 NOTE — Progress Notes (Signed)
 Patient has rested well. No new complaints.  CIWA has been under control and has not needed any prns for agitation this shift.

## 2024-10-25 NOTE — Consult Note (Signed)
 ORTHOPAEDIC CONSULTATION  REQUESTING PHYSICIAN: Willette Adriana LABOR, MD  ASSESSMENT AND PLAN: 38 y.o. male with the following: Left knee effusion; spontaneous hemarthrosis  Patient has panctyopenia with a spontaneous bleed in his left knee.  He has a very tense and swollen left knee.  It is affecting his ability to ambulate and bend his knee.  Arthrocentesis is warranted but has been deferred due to his platelets being very low.  This was discussed again with the patient.  He is in a lot of discomfort.   Concern is aspiration could lead to prolonged bleeding a re-accumulation of blood in his joint.  He states understanding.  This has happened to him in the past and his pain improved with aspiration.  After discussing the risks and benefits, he would like to proceed.   - Weight Bearing Status/Activity: WBAT LLE  - Additional recommended labs/tests: Monitor platelets.  Recommend transfusion if safe to proceed.   -VTE Prophylaxis: As needed  - Pain control: As needed  - Follow-up plan: As needed  -Procedures: Left knee aspiration  Procedure note - aspiration Left knee joint   Verbal consent was obtained to aspirate the left knee joint  Timeout was completed to confirm the site of aspiration. The skin was prepped with alcohol  and ethyl chloride was sprayed at the aspiration site.  An 18-gauge needle was inserted and 110 cc of bloody joint fluid was aspirated using a superolateral approach.  There were no complications. A sterile pressure dressing and Ace wrap was applied.   Chief Complaint: left knee pain  HPI: Gregory Murphy is a 38 y.o. male with PMH as listed below who presented to the ED with a complaint of left knee pain.  He has had issues with left knee effusions in the past.  Secondary to alcoholism, he has recurrent thrombocytopenia and spontaneous bleeding in the left knee.  He also has a history of trauma to the right leg requiring an IMN and more recently an ORIF.   In the past, his knee swelling has responded to aspiration.  The ED provider elected not to aspirate the knee because of his platelets, as did IR.  He has little motion in his knee due to swelling.  He has difficulty walking.  No recent trauma.  No fevers or chills.   Past Medical History:  Diagnosis Date   Alcohol  withdrawal (HCC) 03/01/2024   Alcoholic cirrhosis of liver with ascites (HCC) 03/02/2024   Alcoholic gastritis 03/03/2024   Alcoholic hepatitis with ascites (HCC) 03/02/2024   Brain injury (HCC)    Cirrhosis with alcoholism (HCC)    Critical polytrauma 12/17/2021   Decompensated cirrhosis (HCC) 03/10/2024   Depression    Elevated AFP 03/03/2024   HCV antibody positive 03/03/2024   Insomnia    Macrocytic anemia 03/03/2024   Malnutrition of moderate degree 12/21/2021   Panic attack    Severe thrombocytopenia 03/03/2024   Splenic rupture 09/07/2023   Past Surgical History:  Procedure Laterality Date   ABDOMINAL SURGERY     CLOSED REDUCTION FINGER WITH PERCUTANEOUS PINNING Left 02/04/2024   Procedure: LEFT THUMB CLOSED REDUCTION FINGER WITH PERCUTANEOUS PINNing;  Surgeon: Arlinda Buster, MD;  Location: [facility];  Service: Orthopedics;  Laterality: Left;   DISTAL FEMUR BONE BRIDGE EXCISION Left    FEMUR CLOSED REDUCTION Left 12/17/2021   Procedure: CLOSED REDUCTION FEMORAL SHAFT;  Surgeon: Barton Drape, MD;  Location: [facility];  Service: Orthopedics;  Laterality: Left;   FOREARM SURGERY Left  x4-metal rods placed   I & D EXTREMITY Left 02/04/2024   Procedure: IRRIGATION AND DEBRIDEMENT OF LEFT THUMB;  Surgeon: Arlinda Buster, MD;  Location: [facility];  Service: Orthopedics;  Laterality: Left;   IR PARACENTESIS  03/02/2024   LEG SURGERY Right 2023   NAILBED REPAIR Left 02/04/2024   Procedure: LEFT THUMB NAILBED REPAIR;  Surgeon: Arlinda Buster, MD;  Location: [facility];  Service: Orthopedics;  Laterality: Left;   ORIF  FEMUR FRACTURE Left 12/20/2021   Procedure: OPEN REDUCTION INTERNAL FIXATION (ORIF) DISTAL FEMUR FRACTURE;  Surgeon: Kendal Franky SQUIBB, MD;  Location: [facility];  Service: Orthopedics;  Laterality: Left;   TIBIA IM NAIL INSERTION Bilateral 12/17/2021   Procedure: RIGHT INTRAMEDULLARY (IM) NAIL TIBIAL; RIGHT CLOSED TREATMENT OF FIBULA FRACTURE; LEFT CLOSED REDUCTION SPLINTING OF PERIPROSTHETIC  SUPRACONDYLAR FEMUR FRACTURE;  Surgeon: Barton Drape, MD;  Location: [facility];  Service: Orthopedics;  Laterality: Bilateral;   WRIST ARTHROSCOPY WITH CARPOMETACARPEL [facility]) ARTHROPLASTY Left 03/21/2022   Procedure: left wrist arthroscopy with debridement as needed;  Surgeon: Sissy Cough, MD;  Location:  SURGERY CENTER;  Service: Orthopedics;  Laterality: Left;  just needs 60 mins for surgery   WRIST SURGERY Right    pins   Social History   Socioeconomic History   Marital status: Single    Spouse name: Not on file   Number of children: Not on file   Years of education: Not on file   Highest education level: Not on file  Occupational History   Not on file  Tobacco Use   Smoking status: Every Day    Current packs/day: 0.50    Average packs/day: 0.5 packs/day for 14.0 years (7.0 ttl pk-yrs)    Types: Cigarettes    Passive exposure: Current   Smokeless tobacco: Never  Vaping Use   Vaping status: Former  Substance and Sexual Activity   Alcohol  use: Yes    Alcohol /week: 21.0 standard drinks of alcohol     Types: 21 Cans of beer per week    Comment: previously 4-5 beers daily but now pregame one daily (equivalent of four shots daily) as of 03/23/24   Drug use: Not Currently    Comment: clean for 5 years   Sexual activity: Yes    Birth control/protection: Condom  Other Topics Concern   Not on file  Social History Narrative   Not on file   Social Drivers of Health   Financial Resource Strain: Not on file  Food Insecurity: No Food Insecurity (10/23/2024)   Hunger Vital Sign     Worried About Running Out of Food in the Last Year: Never true    Ran Out of Food in the Last Year: Never true  Transportation Needs: No Transportation Needs (10/23/2024)   PRAPARE - Administrator, Civil Service (Medical): No    Lack of Transportation (Non-Medical): No  Physical Activity: Not on file  Stress: Not on file  Social Connections: Unknown (10/23/2024)   Social Connection and Isolation Panel    Frequency of Communication with Friends and Family: Three times a week    Frequency of Social Gatherings with Friends and Family: Three times a week    Attends Religious Services: Patient unable to answer    Active Member of Clubs or Organizations: Patient unable to answer    Attends Club or Organization Meetings: Patient unable to answer    Marital Status: Never married   History reviewed. No pertinent family history. Allergies  Allergen  Reactions   Latex Itching    Gloves    Prior to Admission medications   Medication Sig Start Date End Date Taking? Authorizing Provider  chlordiazePOXIDE  (LIBRIUM ) 25 MG capsule 50mg  PO TID x 1D, then 25-50mg  PO BID X 1D, then 25-50mg  PO QD X 1D 10/09/24   Elnor Jayson LABOR, DO  Multiple Vitamins-Minerals (MULTIVITAMIN WITH MINERALS) tablet Take 1 tablet by mouth daily. 10/09/24 12/08/24  Elnor Jayson LABOR, DO  ondansetron  (ZOFRAN ) 4 MG tablet Take 1 tablet (4 mg total) by mouth every 8 (eight) hours as needed for nausea or vomiting. 10/09/24   Elnor Jayson A, DO   No results found. Family History Reviewed and non-contributory, no pertinent history of problems with bleeding or anesthesia    Review of Systems No fevers or chills No numbness or tingling No chest pain No shortness of breath No bowel or bladder dysfunction No GI distress No headaches    OBJECTIVE  Vitals:Patient Vitals for the past 8 hrs:  BP Temp Temp src Pulse SpO2 Height Weight  10/25/24 2137 (!) 147/92 98.6 F (37 C) Oral -- -- 5' 1 (1.549 m) 54.4 kg  10/25/24  1952 (!) 149/109 98.8 F (37.1 C) Oral (!) 107 97 % -- --   General: Alert, no acute distress Cardiovascular: Warm extremities noted Respiratory: No cyanosis, no use of accessory musculature GI: No organomegaly, abdomen is soft and non-tender Skin: No lesions in the area of chief complaint other than those listed below in MSK exam.  Neurologic: Sensation intact distally save for the below mentioned MSK exam Psychiatric: Patient is competent for consent with normal mood and affect Lymphatic: No swelling obvious and reported other than the area involved in the exam below Extremities   LLE:  Left knee in flexed position.  Large knee effusion.  Very tense.  Limited motion tolerated.  No redness.  No erythema or warmth.  No bruising.     Test Results Imaging   Left knee XR without acute injury.  No hardware failure.  Large knee effusion.   Labs cbc Recent Labs    10/23/24 0344 10/24/24 0426  WBC 4.6 4.0  HGB 11.2* 11.2*  HCT 32.3* 32.9*  PLT 26* 22*     Labs coag Recent Labs    10/22/24 2354  INR 1.6*    Recent Labs    10/22/24 2354 10/23/24 0344  NA 133* 135  K 3.4* 3.7  CL 97* 99  CO2 26 27  GLUCOSE 234* 162*  BUN <5* <5*  CREATININE 0.75 0.89  CALCIUM  7.9* 7.7*

## 2024-10-25 NOTE — Progress Notes (Signed)
 Patient has been running tachy throughout the day, ranging from 90-120 baseline, when he is up and ambulating its even higher, his highest peak today was 180, not sustained. Charge made aware as well as MD.

## 2024-10-25 NOTE — Plan of Care (Signed)
   Problem: Education: Goal: Knowledge of General Education information will improve Description: Including pain rating scale, medication(s)/side effects and non-pharmacologic comfort measures Outcome: Progressing   Problem: Clinical Measurements: Goal: Ability to maintain clinical measurements within normal limits will improve Outcome: Progressing Goal: Will remain free from infection Outcome: Progressing

## 2024-10-26 DIAGNOSIS — M25462 Effusion, left knee: Secondary | ICD-10-CM | POA: Diagnosis not present

## 2024-10-26 DIAGNOSIS — M25062 Hemarthrosis, left knee: Secondary | ICD-10-CM | POA: Diagnosis not present

## 2024-10-26 LAB — COMPREHENSIVE METABOLIC PANEL WITH GFR
ALT: 27 U/L (ref 0–44)
AST: 67 U/L — ABNORMAL HIGH (ref 15–41)
Albumin: 3 g/dL — ABNORMAL LOW (ref 3.5–5.0)
Alkaline Phosphatase: 161 U/L — ABNORMAL HIGH (ref 38–126)
Anion gap: 8 (ref 5–15)
BUN: 7 mg/dL (ref 6–20)
CO2: 24 mmol/L (ref 22–32)
Calcium: 7.9 mg/dL — ABNORMAL LOW (ref 8.9–10.3)
Chloride: 103 mmol/L (ref 98–111)
Creatinine, Ser: 0.84 mg/dL (ref 0.61–1.24)
GFR, Estimated: >60 mL/min (ref >60)
Glucose, Bld: 108 mg/dL — ABNORMAL HIGH (ref 70–99)
Potassium: 4.7 mmol/L (ref 3.5–5.1)
Sodium: 135 mmol/L (ref 135–145)
Total Bilirubin: 2.9 mg/dL — ABNORMAL HIGH (ref 0.0–1.2)
Total Protein: 6 g/dL — ABNORMAL LOW (ref 6.5–8.1)

## 2024-10-26 LAB — CBC
HCT: 28.5 % — ABNORMAL LOW (ref 39.0–52.0)
Hemoglobin: 9.7 g/dL — ABNORMAL LOW (ref 13.0–17.0)
MCH: 36.9 pg — ABNORMAL HIGH (ref 26.0–34.0)
MCHC: 34 g/dL (ref 30.0–36.0)
MCV: 108.4 fL — ABNORMAL HIGH (ref 80.0–100.0)
Platelets: 24 K/uL — CL (ref 150–400)
RBC: 2.63 MIL/uL — ABNORMAL LOW (ref 4.22–5.81)
RDW: 12.7 % (ref 11.5–15.5)
WBC: 3.2 K/uL — ABNORMAL LOW (ref 4.0–10.5)
nRBC: 0 % (ref 0.0–0.2)

## 2024-10-26 LAB — GLUCOSE, CAPILLARY
Glucose-Capillary: 108 mg/dL — ABNORMAL HIGH (ref 70–99)
Glucose-Capillary: 97 mg/dL (ref 70–99)

## 2024-10-26 LAB — MRSA NEXT GEN BY PCR, NASAL: MRSA by PCR Next Gen: DETECTED — AB

## 2024-10-26 MED ORDER — MUPIROCIN 2 % EX OINT
TOPICAL_OINTMENT | Freq: Two times a day (BID) | CUTANEOUS | Status: DC
  Filled 2024-10-26 (×2): qty 22

## 2024-10-26 MED ORDER — LORAZEPAM 2 MG/ML IJ SOLN
1.0000 mg | INTRAMUSCULAR | Status: DC | PRN
  Administered 2024-10-26: 1 mg via INTRAVENOUS
  Administered 2024-10-26: 3 mg via INTRAVENOUS
  Administered 2024-10-28: 1 mg via INTRAVENOUS
  Filled 2024-10-26: qty 2

## 2024-10-26 MED ORDER — PHENOBARBITAL SODIUM 130 MG/ML IJ SOLN
100.0000 mg | Freq: Once | INTRAMUSCULAR | Status: AC
  Administered 2024-10-26: 100 mg via INTRAVENOUS
  Filled 2024-10-26: qty 1

## 2024-10-26 MED ORDER — LORAZEPAM 1 MG PO TABS
1.0000 mg | ORAL_TABLET | ORAL | Status: DC | PRN
  Administered 2024-10-28 (×2): 1 mg via ORAL
  Filled 2024-10-26 (×3): qty 1

## 2024-10-26 MED ORDER — METOPROLOL TARTRATE 5 MG/5ML IV SOLN: 5.0000 mg | Freq: Four times a day (QID) | INTRAVENOUS | Status: DC

## 2024-10-26 MED ORDER — SODIUM CHLORIDE 0.9 % IV BOLUS
500.0000 mL | INTRAVENOUS | Status: AC
  Administered 2024-10-26: 500 mL via INTRAVENOUS

## 2024-10-26 NOTE — Progress Notes (Addendum)
 Overnight cross coverage, TRH, hospitalist service.  The patient became very agitated with visual hallucinations likely from alcohol  withdrawal with CIWA score of greater than 11 despite IV Ativan  and Librium .  He received multiple doses of IV Ativan  with no significant improvement.  Started on Precedex , will continue IV Ativan  as needed for alcohol  withdrawal.  At the time of this ED, the patient is alert and very confused, agitated with visual hallucinations.  Tachycardic.  Lungs are clear to auscultation.  Moves all 4 extremities and tries to get out of soft restraints.  Precedex  is being uptitrated.  Critical care medicine consulted to assist with the management.  Addendum: Added 100 mg IV phenobarbital, ran over 10 minutes with improvement.  Appreciate CCM, Dr. Jamal assistance.   Critical care time: 30 minutes.

## 2024-10-26 NOTE — Progress Notes (Signed)
 Patient came down from floor, initially CIWA=11. Given PRN Ativan . Pt very agitated and getting out of bed, removing contraptions. HR sustaining 160-180s, blood pressure 170s systolically. CIWA score=29 MD informed. No improvement from Ativan  PRN. Still persistently trying to get out of the bed.  2228- 2 pt wrist restraints applied as ordered 2245h -Bolus of 250ml NSS given. Maintenance fluid ordered for HR maintenance. HR still 150-180s. Pt still climbing out of the bed, this RN, charge nurse and nurse tech inside the room to make patient safe.  2257h-Another Ativan  4mg  given as per CIWA score. Still very agitated.Paged MD again for HR still 170s and pt combative.  2334- MD at bedside and Precedex  started and titrated 0026h- Given another dose of 4mg  Ativan . MD made aware and told this RN that CCM consulted. Precedex  max and pt still severely agitated with HR sustaining 140-150s 0132h- Phenobarb given as ordered. Patient resting comfortably now with 0.39mcg of precedex , HR=92 BP=137/78

## 2024-10-26 NOTE — Plan of Care (Signed)
  Problem: Education: Goal: Knowledge of General Education information will improve Description: Including pain rating scale, medication(s)/side effects and non-pharmacologic comfort measures Outcome: Not Progressing   Problem: Health Behavior/Discharge Planning: Goal: Ability to manage health-related needs will improve Outcome: Not Progressing   Problem: Clinical Measurements: Goal: Ability to maintain clinical measurements within normal limits will improve Outcome: Not Progressing Goal: Will remain free from infection Outcome: Not Progressing Goal: Diagnostic test results will improve Outcome: Not Progressing Goal: Respiratory complications will improve Outcome: Not Progressing Goal: Cardiovascular complication will be avoided Outcome: Not Progressing   Problem: Activity: Goal: Risk for activity intolerance will decrease Outcome: Not Progressing   Problem: Nutrition: Goal: Adequate nutrition will be maintained Outcome: Not Progressing   Problem: Coping: Goal: Level of anxiety will decrease Outcome: Not Progressing   Problem: Elimination: Goal: Will not experience complications related to bowel motility Outcome: Not Progressing Goal: Will not experience complications related to urinary retention Outcome: Not Progressing   Problem: Pain Managment: Goal: General experience of comfort will improve and/or be controlled Outcome: Not Progressing   Problem: Safety: Goal: Ability to remain free from injury will improve Outcome: Not Progressing   Problem: Skin Integrity: Goal: Risk for impaired skin integrity will decrease Outcome: Not Progressing   Problem: Education: Goal: Ability to describe self-care measures that may prevent or decrease complications (Diabetes Survival Skills Education) will improve Outcome: Not Progressing Goal: Individualized Educational Video(s) Outcome: Not Progressing   Problem: Coping: Goal: Ability to adjust to condition or change in  health will improve Outcome: Not Progressing   Problem: Fluid Volume: Goal: Ability to maintain a balanced intake and output will improve Outcome: Not Progressing   Problem: Health Behavior/Discharge Planning: Goal: Ability to identify and utilize available resources and services will improve Outcome: Not Progressing Goal: Ability to manage health-related needs will improve Outcome: Not Progressing   Problem: Metabolic: Goal: Ability to maintain appropriate glucose levels will improve Outcome: Not Progressing   Problem: Nutritional: Goal: Maintenance of adequate nutrition will improve Outcome: Not Progressing Goal: Progress toward achieving an optimal weight will improve Outcome: Not Progressing   Problem: Skin Integrity: Goal: Risk for impaired skin integrity will decrease Outcome: Not Progressing   Problem: Tissue Perfusion: Goal: Adequacy of tissue perfusion will improve Outcome: Not Progressing   Problem: Safety: Goal: Non-violent Restraint(s) Outcome: Not Progressing

## 2024-10-26 NOTE — Plan of Care (Signed)
  Problem: Clinical Measurements: Goal: Respiratory complications will improve Outcome: Progressing Goal: Cardiovascular complication will be avoided Outcome: Progressing   Problem: Elimination: Goal: Will not experience complications related to urinary retention Outcome: Not Progressing   Problem: Safety: Goal: Non-violent Restraint(s) Outcome: Not Progressing

## 2024-10-26 NOTE — Progress Notes (Addendum)
 eLink Physician-Brief Progress Note Patient Name: Gregory Murphy DOB: 1986/10/17 MRN: [id number]   Date of Service  10/26/2024  HPI/Events of Note  37/M with alcoholism, liver cirrhosis, chronic pancytopenia, admitted on 10/23/24 with spontaneous hemarthrosis s/p L knee joint aspiration of about 110cc of bloody joint fluid, with worsening confusion and agitation.  Pt was also being treated for alcohol  withdrawal, with high CIWA scores despite librium  and ativan .  PT started on precedex  gtt and transferred to the ICU.     RN is concerned with tachycardia with rate 110, BP 142/101, RR 17, O2 sats 99%.   Hgb  stable at 11.2, platelets 22, wbc 4.0 Crea 0.89  eICU Interventions  Add lopressor  5mg  IV q6hrs.  Continue thiamine , folate, MV.  Continue precedex  gtt with ativan  as per CIWA.  SCDs for DVT prophylaxis.      Intervention Category Intermediate Interventions: Arrhythmia - evaluation and management Evaluation Type: New Patient Evaluation  Shanda Busman 10/26/2024, 1:22 AM  4:51 AM Discussed with Dr. Maree who spoke with the hospitalist.  The patient was given phenobarbital as per Dr. Jamal recommendations.   BP 137/78, HR 87, RR 28, O2 sats 95%  Plan> Continue current plan.

## 2024-10-26 NOTE — TOC Initial Note (Signed)
 Transition of Care [facility]) - Initial/Assessment Note    Patient Details  Name: Gregory Murphy MRN: [id number] Date of Birth: 1986/01/23  [facility]) CM/SW Contact:    Hoy DELENA Bigness, LCSW Phone Number: 10/26/2024, 11:44 AM  Clinical Narrative:                 Pt assessed due to high risk for readmission. Pt from home with parents. Pt has no PCP. PCP list with providers accepting new patients added to AVS. Substance use resources for outpatient and residential options also placed on discharge instructions.   Expected Discharge Plan: Home/Self Care Barriers to Discharge: Continued Medical Work up   Patient Goals and CMS Choice Patient states their goals for this hospitalization and ongoing recovery are:: Not assessed          Expected Discharge Plan and Services In-house Referral: Clinical Social Work Discharge Planning Services: NA Post Acute Care Choice: NA Living arrangements for the past 2 months: Single Family Home                 DME Arranged: N/A DME Agency: NA                  Prior Living Arrangements/Services Living arrangements for the past 2 months: Single Family Home Lives with:: Parents Patient language and need for interpreter reviewed:: Yes Do you feel safe going back to the place where you live?: Yes      Need for Family Participation in Patient Care: No (Comment) Care giver support system in place?: No (comment)   Criminal Activity/Legal Involvement Pertinent to Current Situation/Hospitalization: No - Comment as needed  Activities of Daily Living   ADL Screening (condition at time of admission) Independently performs ADLs?: Yes (appropriate for developmental age) Is the patient deaf or have difficulty hearing?: No Does the patient have difficulty seeing, even when wearing glasses/contacts?: No Does the patient have difficulty concentrating, remembering, or making decisions?: No  Permission Sought/Granted   Permission  granted to share information with : No              Emotional Assessment   Attitude/Demeanor/Rapport: Unable to Assess Affect (typically observed): Unable to Assess Orientation: : Oriented to Self, Oriented to Place, Oriented to  Time, Oriented to Situation Alcohol  / Substance Use: Not Applicable Psych Involvement: No (comment)  Admission diagnosis:  Effusion of left knee [M25.462] Patient Active Problem List   Diagnosis Date Noted   Vitamin D  deficiency 10/24/2024   Effusion of left knee 10/23/2024   Hemarthrosis of left knee 10/23/2024   Bilirubinuria 10/08/2024   Left knee pain 07/14/2024   Alcohol  abuse 07/14/2024   Pancytopenia (HCC) 07/14/2024   Decompensated cirrhosis (HCC) 03/10/2024   Alcoholic gastritis 03/03/2024   Severe thrombocytopenia 03/03/2024   Electrolyte abnormality 03/03/2024   Macrocytic anemia 03/03/2024   Tobacco abuse 03/03/2024   HCV antibody positive 03/03/2024   Elevated AFP 03/03/2024   Alcoholic hepatitis with ascites (HCC) 03/02/2024   Alcoholic cirrhosis of liver with ascites (HCC) 03/02/2024   Alcohol  withdrawal (HCC) 03/01/2024   Open displaced fracture of distal phalanx of left thumb 02/04/2024   Splenic rupture 09/07/2023   Right tibial fracture 12/22/2021   Malnutrition of moderate degree 12/21/2021   Femur fracture (HCC) 12/17/2021   PCP:  Patient, [name] Pharmacy:   [id number] - [place], [place] - [id number] [place] [address] Phone: [phone] Fax: [phone]  Social Drivers of Health (SDOH) Social History: SDOH Screenings   Food Insecurity: No Food Insecurity (10/23/2024)  Housing: Low Risk  (10/23/2024)  Transportation Needs: No Transportation Needs (10/23/2024)  Utilities: Not At Risk (10/23/2024)  Social Connections: Unknown (10/23/2024)  Tobacco Use: High Risk (10/23/2024)   SDOH Interventions:     Readmission Risk Interventions    10/26/2024   11:33 AM  03/10/2024    5:12 PM  Readmission Risk Prevention Plan  Transportation Screening Complete Complete  PCP or Specialist Appt within 5-7 Days  --  PCP or Specialist Appt within 3-5 Days Complete   Home Care Screening  Complete  Medication Review (RN CM)  Complete  HRI or Home Care Consult Complete   Social Work Consult for Recovery Care Planning/Counseling Complete   Palliative Care Screening Not Applicable   Medication Review Oceanographer) Complete

## 2024-10-26 NOTE — Progress Notes (Signed)
 PROGRESS NOTE   Gregory Murphy  FMW:[id number] DOB: [birth year] DOA: 10/22/2024 PCP: Patient, [name]   Chief Complaint  Patient presents with   Knee Pain   Level of care: ICU  Subjective:  Patient was seen and examined this morning, somnolent on the medication, confused Overnight became tachycardic with CIWA score around 20 Was transferred to stepdown/ICU and started on Precedex  drip   Brief Admission History:  38 y.o. male with medical history significant for alcohol  abuse, alcoholic liver cirrhosis, chronic severe thrombocytopenia, chronic pancytopenia, history of traumatic injury to left femur requiring surgery (Has rods from left hip to his left knee), prior left knee effusion status post arthrocentesis on 07/14/2024, who presents to the ER with left knee pain and swelling x 3 days.  States since his left knee surgery he has had recurrent left knee effusion.  Unable to ambulate or bear weight on the left lower extremity due to severe left knee pain.  No fevers or chills.  Denies any recent trauma to his left knee.  Last alcoholic drink was last night.  Drinks 10-12 beers per day.  EMS was activated, and the patient was brought into the ER for further evaluation.   In the ER, vital signs are stable.  Lab studies are notable for pancytopenia with platelet count of 25,000, WBC 3.7 K, hemoglobin 11.9 K with MCV of 104.6.   X-ray of left knee revealed large joint effusion and moderate severity suprapatellar soft tissue swelling.  Prior ORIF of the distal left femur.   EDP discussed the case with hematology.  Recommended to repeat CBC in the morning and to transfuse 1 unit platelets if platelet count drops below 20,000.  Hematology will see in consultation this morning.  Admitted by [facility], hospitalist service.  The patient gives consent for platelets transfusion and arthrocentesis.   Assessment and Plan:  Metabolic encephalopathy -secondary to alcohol  withdrawal,  medications -Continuing Precedex  drip Ativan  per CIWA protocol  - IV fluid - Close monitoring -Monitoring mentation, soft restraints  large left knee effusion History of recurrent hemarthrosis of the same knee  -Still complaining of left knee pain edema left erythema or warmth - 10/25/2024, status post aspiration left knee joint by Dr. Onesimo    History of traumatic injury to left femur requiring surgery. X-ray of left knee revealed large joint effusion and moderate severity suprapatella soft tissue swelling.   Prior ORIF of the distal left femur. S/p Arthocentesis, Hematology consulted and said that pt could be transfused platelets prior to procedure. Dr. Onesimo made aware of hematologist recommendation.  For now supportive care with topical ice packs. PT eval.    Alcohol  abuse with concern for withdrawal Encephalopathic, going through withdrawal Continue Precedex  drip, Ativan  per CIWA protocol - Also on Librium     Hyperglycemia-- resolved Presented with serum glucose greater than 200 Obtain hemoglobin A1c -- 4.2%  DC insulin orders   Elevated liver chemistries secondary to alcohol  abuse AST to ALT 2:1 ratio likely from alcohol  abuse Elevated alkaline phosphatase 139, T. Bili 2.6 Continue to monitor LFTs.  Severe Vitamin D  deficiency Started Drisdol  50k I.U. weekly   Alcoholic liver cirrhosis Follows with GI outpatient Recommend complete alcohol  cessation Avoid hepatotoxic agents.   Anemia of chronic disease Hemoglobin 11.9 >> 11.2 K, appears at baseline Continue to monitor H&H   Chronic pancytopenia Likely contributed by alcohol  abuse, advanced liver disease Continue to monitor Hematology consultation      Latest Ref Rng & Units 10/26/2024  4:43 AM 10/24/2024    4:26 AM 10/23/2024    3:44 AM  CBC  WBC 4.0 - 10.5 K/uL 3.2  4.0  4.6   Hemoglobin 13.0 - 17.0 g/dL 9.7  88.7  88.7   Hematocrit 39.0 - 52.0 % 28.5  32.9  32.3   Platelets 150 - 400 K/uL 24  22   26        DVT prophylaxis: SCDs Code Status: Full  Family Communication:  Disposition: TBD    Consultants:  Orthopedics Hematology TOC  Procedures:   Antimicrobials:     Objective: Vitals:   10/26/24 0930 10/26/24 0933 10/26/24 1000 10/26/24 1030  BP: (!) 166/87  (!) 156/76 (!) 143/84  Pulse: 78 80 81 76  Resp: (!) 39  (!) 26 (!) 38  Temp:      TempSrc:      SpO2: 95%  94% 99%  Weight:      Height:        Intake/Output Summary (Last 24 hours) at 10/26/2024 1048 Last data filed at 10/26/2024 1000 Gross per 24 hour  Intake 2390.53 ml  Output 1000 ml  Net 1390.53 ml   Filed Weights   10/22/24 2000 10/25/24 2137  Weight: 58 kg 54.4 kg   Examination:       General:  AAO x 3,  cooperative, no distress;   HEENT:  Normocephalic, PERRL, otherwise with in Normal limits   Neuro:  CNII-XII intact. , normal motor and sensation, reflexes intact   Lungs:   Clear to auscultation BL, Respirations unlabored,  No wheezes / crackles  Cardio:    S1/S2, RRR, No murmure, No Rubs or Gallops   Abdomen:  Soft, non-tender, bowel sounds active all four quadrants, no guarding or peritoneal signs.  Muscular  skeletal:  Left knee pain with edema, therefore limited range of motion, tenderness negative any erythema-warmth Limited exam -global generalized weaknesses - in bed, able to move all 4 extremities,   2+ pulses,  symmetric, No pitting edema  Skin:  Dry, warm to touch, negative for any Rashes,  Wounds: Please see nursing documentation           Data Reviewed: I have personally reviewed following labs and imaging studies  CBC: Recent Labs  Lab 10/22/24 2354 10/23/24 0344 10/24/24 0426 10/26/24 0443  WBC 3.7* 4.6 4.0 3.2*  NEUTROABS 2.3 2.9  --   --   HGB 11.9* 11.2* 11.2* 9.7*  HCT 34.0* 32.3* 32.9* 28.5*  MCV 104.6* 104.9* 107.9* 108.4*  PLT 25* 26* 22* 24*    Basic Metabolic Panel: Recent Labs  Lab 10/22/24 2354 10/23/24 0344 10/26/24 0443  NA 133*  135 135  K 3.4* 3.7 4.7  CL 97* 99 103  CO2 26 27 24   GLUCOSE 234* 162* 108*  BUN <5* <5* 7  CREATININE 0.75 0.89 0.84  CALCIUM  7.9* 7.7* 7.9*  MG  --  1.9  --   PHOS  --  3.7  --     CBG: Recent Labs  Lab 10/23/24 1952 10/24/24 0724 10/24/24 1106 10/24/24 1611 10/26/24 0819  GLUCAP 86 90 112* 167* 108*    No results found for this or any previous visit (from the past 240 hours).   Radiology Studies: No results found.   Scheduled Meds:  chlordiazePOXIDE   5 mg Oral TID   Chlorhexidine  Gluconate Cloth  6 each Topical Daily   folic acid   1 mg Oral Daily   multivitamin with minerals  1 tablet Oral Daily  thiamine   100 mg Oral Daily   Or   thiamine   100 mg Intravenous Daily   Vitamin D  (Ergocalciferol )  50,000 Units Oral Q7 days   Continuous Infusions:  dexmedetomidine  (PRECEDEX ) IV infusion 1 mcg/kg/hr (10/26/24 1000)   lactated ringers  125 mL/hr at 10/26/24 1000     LOS: 2 days   Critical care time 55 minutes   Adriana DELENA Grams, MD How to contact the [facility] Attending or Consulting provider 7A - 7P or covering provider during after hours 7P -7A, for this patient?  Check the care team in [facility] and look for a) attending/consulting TRH provider listed and b) the TRH team listed Log into [email or web address] to find provider on call.  Locate the TRH provider you are looking for under [facility] and page to a number that you can be directly reached. If you still have difficulty reaching the provider, please page the [facility] (Director on Call) for the Hospitalists listed on amion for assistance.  10/26/2024, 10:48 AM

## 2024-10-26 NOTE — Progress Notes (Signed)
   ORTHOPAEDIC PROGRESS NOTE  s/p arthrocentesis left knee (spontaneous bleed)  Date of procedure: 10/25/2024  SUBJECTIVE: The patient was admitted to the ICU secondary to CIWA protocol overnight.  This morning, he is asleep, not arousable.  No issues since the aspiration.  OBJECTIVE: PE:  Obtunded.  Left knee with some bruising lateral.  He continues to have an effusion, with Possible reaccumulation.  Ace wrap was too tight with his knee bent.  Vitals:   10/26/24 1126 10/26/24 1130  BP:  (!) 142/89  Pulse:  75  Resp:  (!) 29  Temp: (!) 97.2 F (36.2 C)   SpO2:  96%       Latest Ref Rng & Units 10/26/2024    4:43 AM 10/24/2024    4:26 AM 10/23/2024    3:44 AM  CBC  WBC 4.0 - 10.5 K/uL 3.2  4.0  4.6   Hemoglobin 13.0 - 17.0 g/dL 9.7  88.7  88.7   Hematocrit 39.0 - 52.0 % 28.5  32.9  32.3   Platelets 150 - 400 K/uL 24  22  26      ASSESSMENT: Gregory Murphy is a 38 y.o. male admitted to the ICU following arthrocentesis of the left knee; secondary to alcohol  withdrawal   PLAN: Weightbearing: WBAT LLE Incisional and dressing care: Reinforce dressings as needed Orthopedic device(s): None VTE prophylaxis: Mobilization Pain control: As needed Follow - up plan: As needed  Evaluation left knee demonstrates possible reaccumulation of blood within the left knee.  This is always been a concern, until his platelets are improved or corrected.  Continue to monitor closely.  Hope to avoid the need for another aspiration.   Contact information:     Chrystle Murillo A. Onesimo, MD MS [facility] [address] [place],  [place]  [address] Phone: [phone] Fax: [phone]

## 2024-10-27 DIAGNOSIS — M25462 Effusion, left knee: Secondary | ICD-10-CM | POA: Diagnosis not present

## 2024-10-27 LAB — COMPREHENSIVE METABOLIC PANEL WITH GFR
ALT: 25 U/L (ref 0–44)
AST: 58 U/L — ABNORMAL HIGH (ref 15–41)
Albumin: 2.9 g/dL — ABNORMAL LOW (ref 3.5–5.0)
Alkaline Phosphatase: 119 U/L (ref 38–126)
Anion gap: 9 (ref 5–15)
BUN: 9 mg/dL (ref 6–20)
CO2: 27 mmol/L (ref 22–32)
Calcium: 8.3 mg/dL — ABNORMAL LOW (ref 8.9–10.3)
Chloride: 104 mmol/L (ref 98–111)
Creatinine, Ser: 0.67 mg/dL (ref 0.61–1.24)
GFR, Estimated: >60 mL/min (ref >60)
Glucose, Bld: 118 mg/dL — ABNORMAL HIGH (ref 70–99)
Potassium: 4.2 mmol/L (ref 3.5–5.1)
Sodium: 139 mmol/L (ref 135–145)
Total Bilirubin: 2.9 mg/dL — ABNORMAL HIGH (ref 0.0–1.2)
Total Protein: 6.1 g/dL — ABNORMAL LOW (ref 6.5–8.1)

## 2024-10-27 LAB — CBC
HCT: 31.4 % — ABNORMAL LOW (ref 39.0–52.0)
Hemoglobin: 10.6 g/dL — ABNORMAL LOW (ref 13.0–17.0)
MCH: 36.6 pg — ABNORMAL HIGH (ref 26.0–34.0)
MCHC: 33.8 g/dL (ref 30.0–36.0)
MCV: 108.3 fL — ABNORMAL HIGH (ref 80.0–100.0)
Platelets: 23 K/uL — CL (ref 150–400)
RBC: 2.9 MIL/uL — ABNORMAL LOW (ref 4.22–5.81)
RDW: 12.3 % (ref 11.5–15.5)
WBC: 3.7 K/uL — ABNORMAL LOW (ref 4.0–10.5)
nRBC: 0 % (ref 0.0–0.2)

## 2024-10-27 LAB — MAGNESIUM: Magnesium: 2 mg/dL (ref 1.7–2.4)

## 2024-10-27 LAB — COPPER, SERUM: Copper: 69 ug/dL (ref 69–132)

## 2024-10-27 MED ORDER — TRAMADOL HCL 50 MG PO TABS
50.0000 mg | ORAL_TABLET | Freq: Four times a day (QID) | ORAL | Status: DC | PRN
  Administered 2024-10-27 – 2024-10-30 (×6): 50 mg via ORAL
  Filled 2024-10-27 (×7): qty 1

## 2024-10-27 MED ORDER — LACTATED RINGERS IV SOLN: INTRAVENOUS | Status: AC

## 2024-10-27 MED ORDER — THIAMINE MONONITRATE 100 MG PO TABS
500.0000 mg | ORAL_TABLET | Freq: Every day | ORAL | Status: DC
  Administered 2024-10-27 – 2024-10-30 (×4): 500 mg via ORAL
  Filled 2024-10-27 (×5): qty 5

## 2024-10-27 MED ORDER — OXYCODONE HCL 5 MG PO TABS
5.0000 mg | ORAL_TABLET | ORAL | Status: AC
  Administered 2024-10-27: 5 mg via ORAL
  Filled 2024-10-27: qty 1

## 2024-10-27 NOTE — TOC Progression Note (Signed)
 Transition of Care [facility]) - Progression Note    Patient Details  Name: Gregory Murphy MRN: [id number] Date of Birth: [birth year]  [facility] of [facility]) CM/SW Contact  Lucie Lunger, [place] Phone Number: 10/27/2024, 10:08 AM  Clinical Narrative:    TOC consulted due to family requesting residential substance use treatment facility for pt. CSW went to room, no family currently there. CSW spoke with RN who confirms she has also explained to pts family that they will have to work on this with the resources provided. CSW let hard copies of substance use resources at bedside for pts family to review. TOC to follow.   Expected Discharge Plan: Home/Self Care Barriers to Discharge: Continued Medical Work up               Expected Discharge Plan and Services In-house Referral: Clinical Social Work Discharge Planning Services: NA Post Acute Care Choice: NA Living arrangements for the past 2 months: Single Family Home                 DME Arranged: N/A DME Agency: NA                   Social Drivers of Health (SDOH) Interventions SDOH Screenings   Food Insecurity: No Food Insecurity (10/23/2024)  Housing: Low Risk  (10/23/2024)  Transportation Needs: No Transportation Needs (10/23/2024)  Utilities: Not At Risk (10/23/2024)  Social Connections: Unknown (10/23/2024)  Tobacco Use: High Risk (10/23/2024)    Readmission Risk Interventions    10/26/2024   11:33 AM 03/10/2024    5:12 PM  Readmission Risk Prevention Plan  Transportation Screening Complete Complete  PCP or Specialist Appt within 5-7 Days  --  PCP or Specialist Appt within 3-5 Days Complete   Home Care Screening  Complete  Medication Review (RN CM)  Complete  HRI or Home Care Consult Complete   Social Work Consult for Recovery Care Planning/Counseling Complete   Palliative Care Screening Not Applicable   Medication Review Ocean[employer]) Complete

## 2024-10-27 NOTE — Plan of Care (Signed)
  Problem: Clinical Measurements: Goal: Respiratory complications will improve Outcome: Progressing Goal: Cardiovascular complication will be avoided Outcome: Progressing   Problem: Activity: Goal: Risk for activity intolerance will decrease Outcome: Not Progressing   Problem: Coping: Goal: Level of anxiety will decrease Outcome: Not Progressing   Problem: Elimination: Goal: Will not experience complications related to urinary retention Outcome: Progressing

## 2024-10-27 NOTE — Progress Notes (Signed)
 PROGRESS NOTE   Finnean Cerami  FMW:[id number] DOB: Aug 07, 1986 DOA: 10/22/2024 PCP: Patient, [name]   Chief Complaint  Patient presents with   Knee Pain   Level of care: ICU  Subjective:  The patient was seen and examined this morning, remains somnolent Complaining of some numbness, and recurrent of this knee swelling Hemodynamically stable Less agitated still on Precedex , Librium , as needed Ativan     Brief Admission History:  38 y.o. male with medical history significant for alcohol  abuse, alcoholic liver cirrhosis, chronic severe thrombocytopenia, chronic pancytopenia, history of traumatic injury to left femur requiring surgery (Has rods from left hip to his left knee), prior left knee effusion status post arthrocentesis on 07/14/2024, who presents to the ER with left knee pain and swelling x 3 days.  States since his left knee surgery he has had recurrent left knee effusion.  Unable to ambulate or bear weight on the left lower extremity due to severe left knee pain.  No fevers or chills.  Denies any recent trauma to his left knee.  Last alcoholic drink was last night.  Drinks 10-12 beers per day.  EMS was activated, and the patient was brought into the ER for further evaluation.   In the ER, vital signs are stable.  Lab studies are notable for pancytopenia with platelet count of 25,000, WBC 3.7 K, hemoglobin 11.9 K with MCV of 104.6.   X-ray of left knee revealed large joint effusion and moderate severity suprapatellar soft tissue swelling.  Prior ORIF of the distal left femur.   EDP discussed the case with hematology.  Recommended to repeat CBC in the morning and to transfuse 1 unit platelets if platelet count drops below 20,000.  Hematology will see in consultation this morning.  Admitted by [facility], hospitalist service.  The patient gives consent for platelets transfusion and arthrocentesis.   Assessment and Plan:  Metabolic encephalopathy -secondary to alcohol  withdrawal,  medications - Continue to taper off Precedex , continue Librium  taper, Ativan  per CIWA protocol  - Continuing IV fluids - Close monitoring -Monitoring mentation, soft restraints  large left knee effusion History of recurrent hemarthrosis of the same knee -It appears the knee is swollen again-monitoring closely   -Still complaining of left knee pain edema left erythema or warmth - 10/25/2024, status post aspiration left knee joint by Dr. Onesimo    History of traumatic injury to left femur requiring surgery. X-ray of left knee revealed large joint effusion and moderate severity suprapatella soft tissue swelling.   Prior ORIF of the distal left femur. S/p Arthocentesis, Hematology consulted and said that pt could be transfused platelets prior to procedure. Dr. Onesimo made aware of hematologist recommendation.  For now supportive care with topical ice packs. PT eval.    Alcohol  abuse with concern for withdrawal Encephalopathic, going through withdrawal Continue Precedex  drip, Ativan  per CIWA protocol - Also on Librium     Hyperglycemia-- resolved Presented with serum glucose greater than 200 Obtain hemoglobin A1c -- 4.2%  DC insulin orders   Elevated liver chemistries secondary to alcohol  abuse/cirrhosis -Slowly improving Continue to monitor LFTs,  Severe Vitamin D  deficiency Started Drisdol  50k I.U. weekly   Alcoholic liver cirrhosis Follows with GI outpatient Recommend complete alcohol  cessation Avoid hepatotoxic agents.   Anemia of chronic disease/with chronic pancytopenia Hemoglobin 11.9 >> 11.2 K, appears at baseline Iron studies are stored seems to be her normal limits normal folic acid  and B12 Continue to monitor H&H   Likely contributed by alcohol  abuse, advanced  liver disease Continue to monitor Hematology consultation      Latest Ref Rng & Units 10/27/2024    4:30 AM 10/26/2024    4:43 AM 10/24/2024    4:26 AM  CBC  WBC 4.0 - 10.5 K/uL 3.7  3.2  4.0    Hemoglobin 13.0 - 17.0 g/dL 89.3  9.7  88.7   Hematocrit 39.0 - 52.0 % 31.4  28.5  32.9   Platelets 150 - 400 K/uL 23  24  22     Thrombocytopenia Will transfuse with platelets if less than 20 or any signs of bleeding   DVT prophylaxis: SCDs Code Status: Full  Family Communication:  Disposition: TBD    Consultants:  Orthopedics Hematology TOC  Procedures:   Antimicrobials:     Objective: Vitals:   10/27/24 0433 10/27/24 0800 10/27/24 0801 10/27/24 0900  BP:  126/76  139/77  Pulse:    62  Resp:    (!) 21  Temp: 97.8 F (36.6 C)  97.7 F (36.5 C)   TempSrc: Oral  Oral   SpO2:    98%  Weight:      Height:        Intake/Output Summary (Last 24 hours) at 10/27/2024 1025 Last data filed at 10/27/2024 9386 Gross per 24 hour  Intake 1428.95 ml  Output 2850 ml  Net -1421.05 ml   Filed Weights   10/22/24 2000 10/25/24 2137  Weight: 58 kg 54.4 kg   Examination:       General:  AAO x 3,  cooperative, no distress;   HEENT:  Normocephalic, PERRL, otherwise with in Normal limits   Neuro:  CNII-XII intact. , normal motor and sensation, reflexes intact   Lungs:   Clear to auscultation BL, Respirations unlabored,  No wheezes / crackles  Cardio:    S1/S2, RRR, No murmure, No Rubs or Gallops   Abdomen:  Soft, non-tender, bowel sounds active all four quadrants, no guarding or peritoneal signs.  Muscular  skeletal:  Left knee pain with edema, therefore limited range of motion, tenderness negative any erythema-warmth Limited exam -global generalized weaknesses - in bed, able to move all 4 extremities,   2+ pulses,  symmetric, No pitting edema  Skin:  Dry, warm to touch, negative for any Rashes,  Wounds: Please see nursing documentation           Data Reviewed: I have personally reviewed following labs and imaging studies  CBC: Recent Labs  Lab 10/22/24 2354 10/23/24 0344 10/24/24 0426 10/26/24 0443 10/27/24 0430  WBC 3.7* 4.6 4.0 3.2* 3.7*  NEUTROABS  2.3 2.9  --   --   --   HGB 11.9* 11.2* 11.2* 9.7* 10.6*  HCT 34.0* 32.3* 32.9* 28.5* 31.4*  MCV 104.6* 104.9* 107.9* 108.4* 108.3*  PLT 25* 26* 22* 24* 23*    Basic Metabolic Panel: Recent Labs  Lab 10/22/24 2354 10/23/24 0344 10/26/24 0443 10/27/24 0430  NA 133* 135 135 139  K 3.4* 3.7 4.7 4.2  CL 97* 99 103 104  CO2 26 27 24 27   GLUCOSE 234* 162* 108* 118*  BUN <5* <5* 7 9  CREATININE 0.75 0.89 0.84 0.67  CALCIUM  7.9* 7.7* 7.9* 8.3*  MG  --  1.9  --   --   PHOS  --  3.7  --   --     CBG: Recent Labs  Lab 10/24/24 0724 10/24/24 1106 10/24/24 1611 10/26/24 0819 10/26/24 1628  GLUCAP 90 112* 167* 108* 97  Recent Results (from the past 240 hours)  MRSA Next Gen by PCR, Nasal     Status: Abnormal   Collection Time: 10/25/24  9:29 AM   Specimen: Nasal Mucosa; Nasal Swab  Result Value Ref Range Status   MRSA by PCR Next Gen DETECTED (A) NOT DETECTED Final    Comment: RESULT CALLED TO, READ BACK BY AND VERIFIED WITH: O PUCKETT AT 1155 ON [id number] BY S DALTON (NOTE) The GeneXpert MRSA Assay (FDA approved for NASAL specimens only), is one component of a comprehensive MRSA colonization surveillance program. It is not intended to diagnose MRSA infection nor to guide or monitor treatment for MRSA infections. Test performance is not FDA approved in patients less than 31 years old. Performed at [facility], [address] [place], [place] [address]      Radiology Studies: No results found.   Scheduled Meds:  chlordiazePOXIDE   5 mg Oral TID   Chlorhexidine  Gluconate Cloth  6 each Topical Daily   folic acid   1 mg Oral Daily   multivitamin with minerals  1 tablet Oral Daily   mupirocin ointment   Nasal BID   thiamine   100 mg Oral Daily   Or   thiamine   100 mg Intravenous Daily   Vitamin D  (Ergocalciferol )  50,000 Units Oral Q7 days   Continuous Infusions:  dexmedetomidine  (PRECEDEX ) IV infusion 1 mcg/kg/hr (10/27/24 0926)   lactated ringers        LOS: 3  days   Critical care time 55 minutes   Adriana DELENA Grams, MD How to contact the [facility] Attending or Consulting provider 7A - 7P or covering provider during after hours 7P -7A, for this patient?  Check the care team in [facility] and look for a) attending/consulting TRH provider listed and b) the TRH team listed Log into [email or web address] to find provider on call.  Locate the TR[facility] provider you are looking for under [facility] and page to a number that you can be directly reached. If you still have difficulty reaching the provider, please page the [facility] (Director on Call) for the Hospitalists listed on amion for assistance.  10/27/2024, 10:25 AM

## 2024-10-27 NOTE — Plan of Care (Signed)
  Problem: Nutritional: Goal: Progress toward achieving an optimal weight will improve Outcome: Progressing   Problem: Skin Integrity: Goal: Risk for impaired skin integrity will decrease Outcome: Progressing   Problem: Tissue Perfusion: Goal: Adequacy of tissue perfusion will improve Outcome: Progressing   Problem: Safety: Goal: Non-violent Restraint(s) Outcome: Completed/Met

## 2024-10-28 DIAGNOSIS — M25462 Effusion, left knee: Secondary | ICD-10-CM | POA: Diagnosis not present

## 2024-10-28 LAB — CBC
HCT: 31.4 % — ABNORMAL LOW (ref 39.0–52.0)
Hemoglobin: 10.5 g/dL — ABNORMAL LOW (ref 13.0–17.0)
MCH: 36.8 pg — ABNORMAL HIGH (ref 26.0–34.0)
MCHC: 33.4 g/dL (ref 30.0–36.0)
MCV: 110.2 fL — ABNORMAL HIGH (ref 80.0–100.0)
Platelets: 24 K/uL — CL (ref 150–400)
RBC: 2.85 MIL/uL — ABNORMAL LOW (ref 4.22–5.81)
RDW: 12.9 % (ref 11.5–15.5)
WBC: 4.3 K/uL (ref 4.0–10.5)
nRBC: 0 % (ref 0.0–0.2)

## 2024-10-28 LAB — COMPREHENSIVE METABOLIC PANEL WITH GFR
ALT: 23 U/L (ref 0–44)
AST: 59 U/L — ABNORMAL HIGH (ref 15–41)
Albumin: 2.7 g/dL — ABNORMAL LOW (ref 3.5–5.0)
Alkaline Phosphatase: 113 U/L (ref 38–126)
Anion gap: 9 (ref 5–15)
BUN: 10 mg/dL (ref 6–20)
CO2: 25 mmol/L (ref 22–32)
Calcium: 8 mg/dL — ABNORMAL LOW (ref 8.9–10.3)
Chloride: 99 mmol/L (ref 98–111)
Creatinine, Ser: 0.89 mg/dL (ref 0.61–1.24)
GFR, Estimated: >60 mL/min (ref >60)
Glucose, Bld: 80 mg/dL (ref 70–99)
Potassium: 4.1 mmol/L (ref 3.5–5.1)
Sodium: 134 mmol/L — ABNORMAL LOW (ref 135–145)
Total Bilirubin: 2.8 mg/dL — ABNORMAL HIGH (ref 0.0–1.2)
Total Protein: 5.8 g/dL — ABNORMAL LOW (ref 6.5–8.1)

## 2024-10-28 MED ORDER — LACTATED RINGERS IV SOLN: INTRAVENOUS | Status: AC

## 2024-10-28 MED ORDER — METOPROLOL SUCCINATE ER 50 MG PO TB24
50.0000 mg | ORAL_TABLET | Freq: Every day | ORAL | Status: DC
  Administered 2024-10-29 – 2024-10-30 (×2): 50 mg via ORAL
  Filled 2024-10-28 (×2): qty 1

## 2024-10-28 MED ORDER — ALUM & MAG HYDROXIDE-SIMETH 200-200-20 MG/5ML PO SUSP
30.0000 mL | Freq: Four times a day (QID) | ORAL | Status: DC | PRN
  Administered 2024-10-28: 30 mL via ORAL
  Filled 2024-10-28: qty 30

## 2024-10-28 MED ORDER — MAGNESIUM SULFATE 2 GM/50ML IV SOLN
2.0000 g | Freq: Once | INTRAVENOUS | Status: AC
  Administered 2024-10-28: 2 g via INTRAVENOUS
  Filled 2024-10-28: qty 50

## 2024-10-28 MED ORDER — HYDROMORPHONE HCL 1 MG/ML IJ SOLN
1.0000 mg | INTRAMUSCULAR | Status: DC | PRN
  Administered 2024-10-28 – 2024-10-30 (×7): 1 mg via INTRAVENOUS
  Filled 2024-10-28 (×7): qty 1

## 2024-10-28 MED ORDER — NALTREXONE HCL 50 MG PO TABS
50.0000 mg | ORAL_TABLET | Freq: Every day | ORAL | Status: DC
  Administered 2024-10-28 – 2024-10-30 (×3): 50 mg via ORAL
  Filled 2024-10-28 (×4): qty 1

## 2024-10-28 MED ORDER — METOPROLOL TARTRATE 5 MG/5ML IV SOLN
5.0000 mg | INTRAVENOUS | Status: DC | PRN
  Administered 2024-10-28: 5 mg via INTRAVENOUS
  Filled 2024-10-28: qty 5

## 2024-10-28 NOTE — Progress Notes (Addendum)
 Pt withdrawal score a 5, complained of tremors and mild agitation. One mg of Ativan  given. Pt tolerated well.   BP taken after an hour of giving one mg of Ativan . 133/85 (98).   Noticed the right anterior forearm was swollen where pts IV was in placed. Removed IV, pt tolerated well. Placed ice pack at site. No complaints voiced. Elevated pts arm. Plan of care ongoing, will continue to monitor.

## 2024-10-28 NOTE — Plan of Care (Signed)

## 2024-10-28 NOTE — Plan of Care (Signed)

## 2024-10-28 NOTE — Progress Notes (Signed)
 PROGRESS NOTE   Gregory Murphy  FMW:[id number] DOB: November 29, 1986 DOA: 10/22/2024 PCP: Patient, [name]   Chief Complaint  Patient presents with   Knee Pain   Level of care: Med-Surg  Subjective: The patient was seen and examined this morning, stable complaining of left knee pain, and swelling Much more awake, cooperative, Has been successfully tapered off Precedex , and Librium      Brief Admission History:  38 y.o. male with medical history significant for alcohol  abuse, alcoholic liver cirrhosis, chronic severe thrombocytopenia, chronic pancytopenia, history of traumatic injury to left femur requiring surgery (Has rods from left hip to his left knee), prior left knee effusion status post arthrocentesis on 07/14/2024, who presents to the ER with left knee pain and swelling x 3 days.  States since his left knee surgery he has had recurrent left knee effusion.  Unable to ambulate or bear weight on the left lower extremity due to severe left knee pain.  No fevers or chills.  Denies any recent trauma to his left knee.  Last alcoholic drink was last night.  Drinks 10-12 beers per day.  EMS was activated, and the patient was brought into the ER for further evaluation.   In the ER, vital signs are stable.  Lab studies are notable for pancytopenia with platelet count of 25,000, WBC 3.7 K, hemoglobin 11.9 K with MCV of 104.6.   X-ray of left knee revealed large joint effusion and moderate severity suprapatellar soft tissue swelling.  Prior ORIF of the distal left femur.   EDP discussed the case with hematology.  Recommended to repeat CBC in the morning and to transfuse 1 unit platelets if platelet count drops below 20,000.  Hematology will see in consultation this morning.  Admitted by [facility], hospitalist service.  The patient gives consent for platelets transfusion and arthrocentesis.   Assessment and Plan:  Metabolic encephalopathy -secondary to alcohol  withdrawal, medications -Much  improved mentation, s/p off Precedex  drip and Librium  taper - Continue Ativan  per CIWA protocol -Will discontinue IV fluids, encourage oral hydration - Close monitoring   large left knee effusion History of recurrent hemarthrosis of the same knee -It appears the knee is swollen again -will continue to monitor, may be need to be drained again As needed analgesics   -Still complaining of left knee pain edema left erythema or warmth - 10/25/2024, status post aspiration left knee joint by Dr. Onesimo    History of traumatic injury to left femur requiring surgery. X-ray of left knee revealed large joint effusion and moderate severity suprapatella soft tissue swelling.    Prior ORIF of the distal left femur.  S/p Arthocentesis, Hematology consulted and said that pt could be transfused platelets prior to procedure. Dr. Onesimo made aware of hematologist recommendation.  For now supportive care with topical ice packs. PT eval.    Alcohol  abuse with Dts Status post ICU observation, status post Precedex  and Librium  taper Continue as needed Ativan  per CIWA protocol, thiamine  plus folate Adding naltrexone     Hyperglycemia-- resolved Presented with serum glucose greater than 200 Obtain hemoglobin A1c -- 4.2%  DC insulin orders   Elevated liver chemistries secondary to alcohol  abuse/cirrhosis -Slowly improving Continue to monitor LFTs,  Severe Vitamin D  deficiency Started Drisdol  50k I.U. weekly   Alcoholic liver cirrhosis Follows with GI outpatient Recommend complete alcohol  cessation Avoid hepatotoxic agents.   Anemia of chronic disease/with chronic pancytopenia Hemoglobin 11.9 >> 11.2 K, appears at baseline Iron studies are stored seems to be  her normal limits normal folic acid  and B12 Continue to monitor H&H   Likely contributed by alcohol  abuse, advanced liver disease Continue to monitor Hematology consultation      Latest Ref Rng & Units 10/28/2024    3:20 AM  10/27/2024    4:30 AM 10/26/2024    4:43 AM  CBC  WBC 4.0 - 10.5 K/uL 4.3  3.7  3.2   Hemoglobin 13.0 - 17.0 g/dL 89.4  89.3  9.7   Hematocrit 39.0 - 52.0 % 31.4  31.4  28.5   Platelets 150 - 400 K/uL 24  23  24     Thrombocytopenia Will transfuse with platelets if less than 20 or any signs of bleeding   DVT prophylaxis: SCDs Code Status: Full  Family Communication:  Disposition: TBD    Consultants:  Orthopedics Hematology TOC  Procedures:   Antimicrobials:     Objective: Vitals:   10/28/24 0930 10/28/24 1000 10/28/24 1030 10/28/24 1142  BP: 135/64 (!) 135/106 126/77   Pulse: 77 (!) 128 (!) 103   Resp: 15 13 12    Temp:    98.9 F (37.2 C)  TempSrc:    Oral  SpO2: 97% 100% 99%   Weight:      Height:        Intake/Output Summary (Last 24 hours) at 10/28/2024 1218 Last data filed at 10/28/2024 9341 Gross per 24 hour  Intake 2187.98 ml  Output 500 ml  Net 1687.98 ml   Filed Weights   10/22/24 2000 10/25/24 2137  Weight: 58 kg 54.4 kg   Examination:  General:  More awake alert, following commands  HEENT:  Normocephalic, PERRL, otherwise with in Normal limits   Neuro:  CNII-XII intact. , normal motor and sensation, reflexes intact   Lungs:   Clear to auscultation BL, Respirations unlabored,  No wheezes / crackles  Cardio:    S1/S2, RRR, No murmure, No Rubs or Gallops   Abdomen:  Soft, non-tender, bowel sounds active all four quadrants, no guarding or peritoneal signs.  Muscular  skeletal:  Limited exam -global generalized weaknesses - in bed, able to move all 4 extremities,   2+ pulses,  symmetric, No pitting edema  Skin:  Dry, warm to touch, negative for any Rashes,  Wounds: Please see nursing documentation     Data Reviewed: I have personally reviewed following labs and imaging studies  CBC: Recent Labs  Lab 10/22/24 2354 10/23/24 0344 10/24/24 0426 10/26/24 0443 10/27/24 0430 10/28/24 0320  WBC 3.7* 4.6 4.0 3.2* 3.7* 4.3  NEUTROABS 2.3 2.9   --   --   --   --   HGB 11.9* 11.2* 11.2* 9.7* 10.6* 10.5*  HCT 34.0* 32.3* 32.9* 28.5* 31.4* 31.4*  MCV 104.6* 104.9* 107.9* 108.4* 108.3* 110.2*  PLT 25* 26* 22* 24* 23* 24*    Basic Metabolic Panel: Recent Labs  Lab 10/22/24 2354 10/23/24 0344 10/26/24 0443 10/27/24 0430 10/28/24 0320  NA 133* 135 135 139 134*  K 3.4* 3.7 4.7 4.2 4.1  CL 97* 99 103 104 99  CO2 26 27 24 27 25   GLUCOSE 234* 162* 108* 118* 80  BUN <5* <5* 7 9 10   CREATININE 0.75 0.89 0.84 0.67 0.89  CALCIUM  7.9* 7.7* 7.9* 8.3* 8.0*  MG  --  1.9  --  2.0  --   PHOS  --  3.7  --   --   --     CBG: Recent Labs  Lab 10/24/24 0724 10/24/24 1106 10/24/24  1611 10/26/24 0819 10/26/24 1628  GLUCAP 90 112* 167* 108* 97    Recent Results (from the past 240 hours)  MRSA Next Gen by PCR, Nasal     Status: Abnormal   Collection Time: 10/25/24  9:29 AM   Specimen: Nasal Mucosa; Nasal Swab  Result Value Ref Range Status   MRSA by PCR Next Gen DETECTED (A) NOT DETECTED Final    Comment: RESULT CALLED TO, READ BACK BY AND VERIFIED WITH: O PUCKETT AT 1155 ON [id number] BY S DALTON (NOTE) The GeneXpert MRSA Assay (FDA approved for NASAL specimens only), is one component of a comprehensive MRSA colonization surveillance program. It is not intended to diagnose MRSA infection nor to guide or monitor treatment for MRSA infections. Test performance is not FDA approved in patients less than 82 years old. Performed at [facility], [address] [place], [place] [address]      Radiology Studies: No results found.   Scheduled Meds:  Chlorhexidine  Gluconate Cloth  6 each Topical Daily   folic acid   1 mg Oral Daily   multivitamin with minerals  1 tablet Oral Daily   mupirocin ointment   Nasal BID   thiamine   500 mg Oral Daily   Vitamin D  (Ergocalciferol )  50,000 Units Oral Q7 days   Continuous Infusions:  dexmedetomidine  (PRECEDEX ) IV infusion Stopped (10/28/24 0538)     LOS: 4 days   Critical care time 55  minutes   Adriana DELENA Grams, MD How to contact the [facility] Attending or Consulting provider 7A - 7P or covering provider during after hours 7P -7A, for this patient?  Check the care team in [facility] and look for a) attending/consulting TR[facility] provider listed and b) the [facility] team listed Log into [email or web address] to find provider on call.  Locate the [facility] provider you are looking for under [facility] and page to a number that you can be directly reached. If you still have difficulty reaching the provider, please page the [facility] (Director on Call) for the Hospitalists listed on amion for assistance.  10/28/2024, 12:18 PM

## 2024-10-29 DIAGNOSIS — M25462 Effusion, left knee: Secondary | ICD-10-CM | POA: Diagnosis not present

## 2024-10-29 LAB — CBC
HCT: 28.1 % — ABNORMAL LOW (ref 39.0–52.0)
Hemoglobin: 9.8 g/dL — ABNORMAL LOW (ref 13.0–17.0)
MCH: 37.7 pg — ABNORMAL HIGH (ref 26.0–34.0)
MCHC: 34.9 g/dL (ref 30.0–36.0)
MCV: 108.1 fL — ABNORMAL HIGH (ref 80.0–100.0)
Platelets: 34 K/uL — ABNORMAL LOW (ref 150–400)
RBC: 2.6 MIL/uL — ABNORMAL LOW (ref 4.22–5.81)
RDW: 13 % (ref 11.5–15.5)
WBC: 6 K/uL (ref 4.0–10.5)
nRBC: 0 % (ref 0.0–0.2)

## 2024-10-29 NOTE — Progress Notes (Signed)
 Withdrawal scores 5, twice during the night. Pt requested Ativan  due to feeling he needed something to keep him in the bed. Tremors were noted as well as small agitation. He also stated he felt cats crawling on him.  One mg of Ativan  given, on both occasions, pt tolerated well. Currently sleeping, Rise and chest was visualized, Plan of care ongoing. Will continue to monitor for signs of discomfort and decline.
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
## 2024-10-29 NOTE — Progress Notes (Signed)
 Patient ID: Gregory Murphy, male   DOB: 04-May-1986, 38 y.o.   MRN: [id number]  Hemarthrosis  Low platelet count  Covering for Dr. Onesimo: - 38 year old male with hemarthrosis low platelet count had an aspiration of bloody fluid less than a week ago.  It seems that the fluid has reaccumulated.  According to Dr. Prentiss note it was blood. Unless the platelet count goes above 60,000 there is no need to reaspirate the knee as it will come back.  So the answer to this issue is to get the platelet count above 60,000 and then aspirate the knee

## 2024-10-29 NOTE — Plan of Care (Signed)
  Problem: Clinical Measurements: Goal: Will remain free from infection Outcome: Progressing   Problem: Activity: Goal: Risk for activity intolerance will decrease Outcome: Progressing   Problem: Coping: Goal: Level of anxiety will decrease Outcome: Progressing   Problem: Pain Managment: Goal: General experience of comfort will improve and/or be controlled Outcome: Progressing   Problem: Skin Integrity: Goal: Risk for impaired skin integrity will decrease Outcome: Progressing

## 2024-10-29 NOTE — Plan of Care (Signed)
  Problem: Education: Goal: Knowledge of General Education information will improve Description: Including pain rating scale, medication(s)/side effects and non-pharmacologic comfort measures Outcome: Progressing   Problem: Health Behavior/Discharge Planning: Goal: Ability to manage health-related needs will improve Outcome: Progressing   Problem: Clinical Measurements: Goal: Ability to maintain clinical measurements within normal limits will improve Outcome: Progressing Goal: Will remain free from infection Outcome: Progressing Goal: Diagnostic test results will improve Outcome: Progressing Goal: Respiratory complications will improve Outcome: Progressing Goal: Cardiovascular complication will be avoided Outcome: Progressing   Problem: Activity: Goal: Risk for activity intolerance will decrease Outcome: Progressing   Problem: Nutrition: Goal: Adequate nutrition will be maintained Outcome: Progressing   Problem: Coping: Goal: Level of anxiety will decrease Outcome: Progressing   Problem: Elimination: Goal: Will not experience complications related to bowel motility Outcome: Progressing Goal: Will not experience complications related to urinary retention Outcome: Progressing   Problem: Pain Managment: Goal: General experience of comfort will improve and/or be controlled Outcome: Progressing   Problem: Education: Goal: Ability to describe self-care measures that may prevent or decrease complications (Diabetes Survival Skills Education) will improve Outcome: Progressing Goal: Individualized Educational Video(s) Outcome: Progressing   Problem: Coping: Goal: Ability to adjust to condition or change in health will improve Outcome: Progressing   Problem: Metabolic: Goal: Ability to maintain appropriate glucose levels will improve Outcome: Progressing   Problem: Nutritional: Goal: Maintenance of adequate nutrition will improve Outcome: Progressing Goal: Progress  toward achieving an optimal weight will improve Outcome: Progressing   Problem: Skin Integrity: Goal: Risk for impaired skin integrity will decrease Outcome: Progressing   Problem: Tissue Perfusion: Goal: Adequacy of tissue perfusion will improve Outcome: Progressing

## 2024-10-29 NOTE — Progress Notes (Signed)
 PROGRESS NOTE   Gregory Murphy  FMW:[id number] DOB: Feb 01, 1986 DOA: 10/22/2024 PCP: Patient, [name]   Chief Complaint  Patient presents with   Knee Pain   Level of care: Telemetry  Subjective: The patient was seen and examined this morning, still complaining of knee pain Otherwise awake alert oriented, following commands  -Status post fall detox with Precedex , Librium  - Received 1 dose of Ativan  overnight     Brief Admission History:  38 y.o. male with medical history significant for alcohol  abuse, alcoholic liver cirrhosis, chronic severe thrombocytopenia, chronic pancytopenia, history of traumatic injury to left femur requiring surgery (Has rods from left hip to his left knee), prior left knee effusion status post arthrocentesis on 07/14/2024, who presents to the ER with left knee pain and swelling x 3 days.  States since his left knee surgery he has had recurrent left knee effusion.  Unable to ambulate or bear weight on the left lower extremity due to severe left knee pain.  No fevers or chills.  Denies any recent trauma to his left knee.  Last alcoholic drink was last night.  Drinks 10-12 beers per day.  EMS was activated, and the patient was brought into the ER for further evaluation.   In the ER, vital signs are stable.  Lab studies are notable for pancytopenia with platelet count of 25,000, WBC 3.7 K, hemoglobin 11.9 K with MCV of 104.6.   X-ray of left knee revealed large joint effusion and moderate severity suprapatellar soft tissue swelling.  Prior ORIF of the distal left femur.   EDP discussed the case with hematology.  Recommended to repeat CBC in the morning and to transfuse 1 unit platelets if platelet count drops below 20,000.  Hematology will see in consultation this morning.  Admitted by [facility], hospitalist service.  The patient gives consent for platelets transfusion and arthrocentesis.   Assessment and Plan:  Metabolic encephalopathy -secondary to alcohol   withdrawal, medications -Mentation much improved, back to baseline  -Much improved mentation, s/p off Precedex  drip and Librium  taper - Continue Ativan  per CIWA protocol -Received 1 dose of Ativan  overnight - S/p IV fluid resuscitation,  large left knee effusion History of recurrent hemarthrosis of the same knee -It appears the knee is swollen again -continue as needed analgesics  -Still complaining of left knee pain edema left erythema or warmth - 10/25/2024, status post aspiration left knee joint by Dr. Onesimo  - Discussed with Dr. Bertina and Dr. Margrette -will not pursue another arthrocentesis till platelets > greater than 60.  - Offered to transfuse platelets if ready for another arthrocentesis   History of traumatic injury to left femur requiring surgery. X-ray of left knee revealed large joint effusion and moderate severity suprapatella soft tissue swelling.    Prior ORIF of the distal left femur.  S/p Arthocentesis, Hematology consulted and said that pt could be transfused platelets prior to procedure. Dr. Onesimo made aware of hematologist recommendation.     Alcohol  abuse with Dts Status post ICU observation, status post Precedex  and Librium  taper Continue as needed Ativan  per CIWA protocol, thiamine  plus folate Added naltrexone     Hyperglycemia-- resolved Obtain hemoglobin A1c -- 4.2%  DC insulin orders   Elevated liver chemistries secondary to alcohol  abuse/cirrhosis -Slowly improving Continue to monitor LFTs,  Severe Vitamin D  deficiency - Started Drisdol  50k I.U. weekly   Alcoholic liver cirrhosis Follows with GI outpatient Recommend complete alcohol  cessation Avoid hepatotoxic agents.   Pancytopenia Anemia of chronic disease  Hemoglobin 11.9 >> 11.2 K, appears at baseline Iron studies are stored seems to be her normal limits normal folic acid  and B12 Continue to monitor H&H   Likely contributed by alcohol  abuse, advanced liver disease Continue to  monitor Hematology consultation      Latest Ref Rng & Units 10/29/2024    4:08 AM 10/28/2024    3:20 AM 10/27/2024    4:30 AM  CBC  WBC 4.0 - 10.5 K/uL 6.0  4.3  3.7   Hemoglobin 13.0 - 17.0 g/dL 9.8  89.4  89.3   Hematocrit 39.0 - 52.0 % 28.1  31.4  31.4   Platelets 150 - 400 K/uL 34  24  23    Thrombocytopenia Will transfuse with platelets if less than 20 or any signs of bleeding Unless pursued with arthrocentesis   DVT prophylaxis: SCDs Code Status: Full  Family Communication:  Disposition: TBD    Consultants:  Orthopedics Hematology TOC  Procedures:   Antimicrobials:     Objective: Vitals:   10/29/24 0200 10/29/24 0238 10/29/24 0355 10/29/24 0837  BP: (!) 134/94 (!) 130/90 130/78 129/87  Pulse: (!) 103 90 95 (!) 102  Resp: 18  16 20   Temp: 98.1 F (36.7 C)  98.3 F (36.8 C) 98.2 F (36.8 C)  TempSrc: Oral  Oral Oral  SpO2: 96%  95% 96%  Weight:      Height:        Intake/Output Summary (Last 24 hours) at 10/29/2024 1237 Last data filed at 10/29/2024 9187 Gross per 24 hour  Intake 686.67 ml  Output 800 ml  Net -113.33 ml   Filed Weights   10/22/24 2000 10/25/24 2137  Weight: 58 kg 54.4 kg   Examination:      General:  AAO x 3,  cooperative, no distress;   HEENT:  Normocephalic, PERRL, otherwise with in Normal limits   Neuro:  CNII-XII intact. , normal motor and sensation, reflexes intact   Lungs:   Clear to auscultation BL, Respirations unlabored,  No wheezes / crackles  Cardio:    S1/S2, RRR, No murmure, No Rubs or Gallops   Abdomen:  Soft, non-tender, bowel sounds active all four quadrants, no guarding or peritoneal signs.  Muscular  skeletal:  Limited exam -global generalized weaknesses - in bed, able to move all 4 extremities,   2+ pulses,  symmetric, No pitting edema Left knee edema/tenderness   Skin:  Dry, warm to touch, negative for any Rashes,  Wounds: Please see nursing documentation          Data Reviewed: I have  personally reviewed following labs and imaging studies  CBC: Recent Labs  Lab 10/22/24 2354 10/23/24 0344 10/24/24 0426 10/26/24 0443 10/27/24 0430 10/28/24 0320 10/29/24 0408  WBC 3.7* 4.6 4.0 3.2* 3.7* 4.3 6.0  NEUTROABS 2.3 2.9  --   --   --   --   --   HGB 11.9* 11.2* 11.2* 9.7* 10.6* 10.5* 9.8*  HCT 34.0* 32.3* 32.9* 28.5* 31.4* 31.4* 28.1*  MCV 104.6* 104.9* 107.9* 108.4* 108.3* 110.2* 108.1*  PLT 25* 26* 22* 24* 23* 24* 34*    Basic Metabolic Panel: Recent Labs  Lab 10/22/24 2354 10/23/24 0344 10/26/24 0443 10/27/24 0430 10/28/24 0320  NA 133* 135 135 139 134*  K 3.4* 3.7 4.7 4.2 4.1  CL 97* 99 103 104 99  CO2 26 27 24 27 25   GLUCOSE 234* 162* 108* 118* 80  BUN <5* <5* 7 9 10   CREATININE 0.75  0.89 0.84 0.67 0.89  CALCIUM  7.9* 7.7* 7.9* 8.3* 8.0*  MG  --  1.9  --  2.0  --   PHOS  --  3.7  --   --   --     CBG: Recent Labs  Lab 10/24/24 0724 10/24/24 1106 10/24/24 1611 10/26/24 0819 10/26/24 1628  GLUCAP 90 112* 167* 108* 97    Recent Results (from the past 240 hours)  MRSA Next Gen by PCR, Nasal     Status: Abnormal   Collection Time: 10/25/24  9:29 AM   Specimen: Nasal Mucosa; Nasal Swab  Result Value Ref Range Status   MRSA by PCR Next Gen DETECTED (A) NOT DETECTED Final    Comment: RESULT CALLED TO, READ BACK BY AND VERIFIED WITH: O PUCKETT AT 1155 ON [id number] BY S DALTON (NOTE) The GeneXpert MRSA Assay (FDA approved for NASAL specimens only), is one component of a comprehensive MRSA colonization surveillance program. It is not intended to diagnose MRSA infection nor to guide or monitor treatment for MRSA infections. Test performance is not FDA approved in patients less than 17 years old. Performed at [facility], [address], [place], [place] [address]      Radiology Studies: No results found.   Scheduled Meds:  folic acid   1 mg Oral Daily   metoprolol  succinate  50 mg Oral Daily   multivitamin with minerals  1 tablet Oral Daily    mupirocin ointment   Nasal BID   naltrexone  50 mg Oral Daily   thiamine   500 mg Oral Daily   Vitamin D  (Ergocalciferol )  50,000 Units Oral Q7 days   Continuous Infusions:  lactated ringers  100 mL/hr at 10/29/24 0330     LOS: 5 days   Time 55 minutes   Adriana DELENA Grams, MD How to contact the [facility] Attending or Consulting provider 7A - 7P or covering provider during after hours 7P -7A, for this patient?  Check the care team in [facility] and look for a) attending/consulting [facility] provider listed and b) the [facility] team listed Log into [email or web address] to find provider on call.  Locate the [facility] provider you are looking for under [facility] and page to a number that you can be directly reached. If you still have difficulty reaching the provider, please page the [facility] (Director on Call) for the Hospitalists listed on amion for assistance.  10/29/2024, 12:37 PM

## 2024-10-30 DIAGNOSIS — D696 Thrombocytopenia, unspecified: Secondary | ICD-10-CM | POA: Diagnosis not present

## 2024-10-30 DIAGNOSIS — M25062 Hemarthrosis, left knee: Secondary | ICD-10-CM | POA: Diagnosis not present

## 2024-10-30 DIAGNOSIS — F1010 Alcohol abuse, uncomplicated: Secondary | ICD-10-CM | POA: Diagnosis not present

## 2024-10-30 DIAGNOSIS — M25462 Effusion, left knee: Secondary | ICD-10-CM | POA: Diagnosis not present

## 2024-10-30 LAB — CBC
HCT: 29.2 % — ABNORMAL LOW (ref 39.0–52.0)
Hemoglobin: 9.8 g/dL — ABNORMAL LOW (ref 13.0–17.0)
MCH: 36.4 pg — ABNORMAL HIGH (ref 26.0–34.0)
MCHC: 33.6 g/dL (ref 30.0–36.0)
MCV: 108.6 fL — ABNORMAL HIGH (ref 80.0–100.0)
Platelets: 39 K/uL — ABNORMAL LOW (ref 150–400)
RBC: 2.69 MIL/uL — ABNORMAL LOW (ref 4.22–5.81)
RDW: 12.9 % (ref 11.5–15.5)
WBC: 5.4 K/uL (ref 4.0–10.5)
nRBC: 0 % (ref 0.0–0.2)

## 2024-10-30 MED ORDER — CHLORDIAZEPOXIDE HCL 25 MG PO CAPS: ORAL_CAPSULE | ORAL | 0 refills | Status: DC

## 2024-10-30 MED ORDER — FOLIC ACID 1 MG PO TABS: 1.0000 mg | ORAL_TABLET | Freq: Every day | ORAL | 0 refills | Status: AC

## 2024-10-30 MED ORDER — NICOTINE 21 MG/24HR TD PT24
21.0000 mg | MEDICATED_PATCH | Freq: Every day | TRANSDERMAL | Status: DC
  Administered 2024-10-30: 21 mg via TRANSDERMAL
  Filled 2024-10-30: qty 1

## 2024-10-30 MED ORDER — VITAMIN D (ERGOCALCIFEROL) 1.25 MG (50000 UNIT) PO CAPS: 50000.0000 [IU] | ORAL_CAPSULE | ORAL | 0 refills | Status: AC

## 2024-10-30 MED ORDER — METOPROLOL SUCCINATE ER 50 MG PO TB24: 50.0000 mg | ORAL_TABLET | Freq: Every day | ORAL | 0 refills | Status: DC

## 2024-10-30 MED ORDER — NALTREXONE HCL 50 MG PO TABS: 50.0000 mg | ORAL_TABLET | Freq: Every day | ORAL | 0 refills | Status: AC

## 2024-10-30 MED ORDER — NICOTINE 21 MG/24HR TD PT24: 21.0000 mg | MEDICATED_PATCH | Freq: Every day | TRANSDERMAL | 0 refills | Status: AC

## 2024-10-30 MED ORDER — THIAMINE MONONITRATE 250 MG PO TABS: 500.0000 mg | ORAL_TABLET | Freq: Every day | ORAL | 0 refills | Status: AC

## 2024-10-30 MED ORDER — TRAMADOL HCL 50 MG PO TABS: 50.0000 mg | ORAL_TABLET | Freq: Four times a day (QID) | ORAL | 0 refills | Status: DC | PRN

## 2024-10-30 NOTE — Discharge Summary (Signed)
 Physician Discharge Summary   Patient: Gregory Murphy MRN: [id number] DOB: October 06, 1986  Admit date:     10/22/2024  Discharge date: 10/30/24  Discharge Physician: Carliss LELON Canales   PCP: Patient, [name]   Recommendations at discharge:    Pt to be discharged home.   If you experience worsening fever, chills, chest pain, shortness of breath, or other concerning symptoms, please call your PCP or go to the emergency department immediately.  Discharge Diagnoses: Principal Problem:   Effusion of left knee Active Problems:   Severe thrombocytopenia   HCV antibody positive   Left knee pain   Malnutrition of moderate degree   Tobacco abuse   Decompensated cirrhosis (HCC)   Alcohol  abuse   Pancytopenia (HCC)   Hemarthrosis of left knee   Vitamin D  deficiency  Resolved Problems:   * No resolved hospital problems. *   Hospital Course:  38 y.o. male with medical history significant for alcohol  abuse, alcoholic liver cirrhosis, chronic severe thrombocytopenia, chronic pancytopenia, history of traumatic injury to left femur requiring surgery (Has rods from left hip to his left knee), prior left knee effusion status post arthrocentesis on 07/14/2024, who presents to the ER with left knee pain and swelling x 3 days.  States since his left knee surgery he has had recurrent left knee effusion.  Unable to ambulate or bear weight on the left lower extremity due to severe left knee pain.  No fevers or chills.  Denies any recent trauma to his left knee.  Last alcoholic drink was last night.  Drinks 10-12 beers per day.  EMS was activated, and the patient was brought into the ER for further evaluation.   In the ER, vital signs are stable.  Lab studies are notable for pancytopenia with platelet count of 25,000, WBC 3.7 K, hemoglobin 11.9 K with MCV of 104.6.   X-ray of left knee revealed large joint effusion and moderate severity suprapatellar soft tissue swelling.  Prior ORIF of the distal left  femur.   EDP discussed the case with hematology.  Recommended to repeat CBC in the morning and to transfuse 1 unit platelets if platelet count drops below 20,000.  Hematology will see in consultation this morning.  Admitted by [facility], hospitalist service.  The patient gives consent for platelets transfusion and arthrocentesis.  Assessment and Plan:  Acute metabolic encephalopathy - Multifactorial etiology secondary to alcohol  withdrawal, medications.  Initially required Precedex  drip, subsequently weaned off.  Completed Librium  taper was on Ativan .  Appears to be doing well, back at baseline, alert, eager for discharge home.  Will represcribe Librium  taper to use as directed.  Recommend abstaining from alcohol  if possible.  Large left knee effusion - Evaluated by orthopedic surgery.  S/p left knee arthrocentesis with mostly bloody discharge.  Likely spontaneous given thrombocytopenia.  Appears to be stable at this time.  No repeat arthrocentesis warranted.  Would not pursue repeat arthrocentesis unless platelets greater than 60,000.  Would recommend patient follow with primary care physician and possibly orthopedic surgery in the outpatient setting.  Alcohol  use and dependence with withdrawal - As noted above, initially in ICU on Precedex  drip and Librium  taper.  Subsequently weaned down to as needed Ativan .  Thiamine  and folate on board.  Naltrexone added for opioid dependence.  Upon discharge will represcribe Librium  taper to use as directed.  Will also give naltrexone.  Avoid opioids and alcohol .  Recommend counseling, close follow-up, social support.  Severe vitamin D  deficiency - Initiated on  50,000 IU weekly.  Prescription provided.  Alcoholic cirrhosis - Recommend continued alcohol  cessation.  Follow with GI in the outpatient setting.  Pancytopenia/anemia of chronic disease/thrombocytopenia - Currently stable.  Platelets slightly improving.  Likely secondary to advanced liver disease and  alcohol  abuse.  Would recommend hematology consultation in outpatient setting should patient continue to abstain from alcohol .  Consultants: Orthopedic surgery Procedures performed: Arthrocentesis Disposition: Home Diet recommendation:  Discharge Diet Orders (From admission, onward)     Start     Ordered   10/30/24 0000  Diet - low sodium heart healthy        10/30/24 1329           Regular diet  DISCHARGE MEDICATION: Allergies as of 10/30/2024       Reactions   Latex Itching   Gloves         Medication List     STOP taking these medications    ondansetron  4 MG tablet Commonly known as: ZOFRAN        TAKE these medications    chlordiazePOXIDE  25 MG capsule Commonly known as: LIBRIUM  50mg  PO TID x 1D, then 25-50mg  PO BID X 1D, then 25-50mg  PO QD X 1D   folic acid  1 MG tablet Commonly known as: FOLVITE  Take 1 tablet (1 mg total) by mouth daily. Start taking on: October 31, 2024   metoprolol  succinate 50 MG 24 hr tablet Commonly known as: TOPROL -XL Take 1 tablet (50 mg total) by mouth daily. Take with or immediately following a meal. Start taking on: October 31, 2024   multivitamin with minerals tablet Take 1 tablet by mouth daily.   naltrexone 50 MG tablet Commonly known as: DEPADE Take 1 tablet (50 mg total) by mouth daily. Start taking on: October 31, 2024   nicotine 21 mg/24hr patch Commonly known as: NICODERM CQ - dosed in mg/24 hours Place 1 patch (21 mg total) onto the skin daily. Start taking on: October 31, 2024   Thiamine  Mononitrate 250 MG Tabs Take 500 mg by mouth daily. Start taking on: October 31, 2024   traMADol  50 MG tablet Commonly known as: ULTRAM  Take 1 tablet (50 mg total) by mouth every 6 (six) hours as needed for severe pain (pain score 7-10) or moderate pain (pain score 4-6).         Discharge Exam: Filed Weights   10/22/24 2000 10/25/24 2137  Weight: 58 kg 54.4 kg    GENERAL:  Alert, pleasant, no acute  distress, disheveled HEENT:  EOMI CARDIOVASCULAR:  RRR, no murmurs appreciated RESPIRATORY:  Clear to auscultation, no wheezing, rales, or rhonchi GASTROINTESTINAL:  Soft, nontender, nondistended EXTREMITIES: Left knee minimally edematous NEURO:  No new focal deficits appreciated SKIN: Bandage over left knee PSYCH:  Appropriate mood and affect     Condition at discharge: improving  The results of significant diagnostics from this hospitalization (including imaging, microbiology, ancillary and laboratory) are listed below for reference.   Imaging Studies: US  Abdomen Limited RUQ (LIVER/GB) Result Date: 10/23/2024 CLINICAL DATA:  Thrombocytopenia EXAM: ULTRASOUND ABDOMEN LIMITED RIGHT UPPER QUADRANT COMPARISON:  Ultrasound 10/08/2024, CT 10/08/2024 FINDINGS: Gallbladder: Distended gallbladder with small stones. Normal wall thickness. No Murphy indicated. Common bile duct: Diameter: 5 mm Liver: Echogenic. Nodular surface consistent with history of cirrhosis. Portal vein is patent on color Doppler imaging with normal direction of blood flow towards the liver. Other: None. IMPRESSION: 1. Distended gallbladder with small stones. No sonographic evidence for acute cholecystitis. 2. Echogenic liver with nodular surface consistent with  history of cirrhosis. Electronically Signed   By: Luke Bun M.D.   On: 10/23/2024 22:32   DG Knee Complete 4 Views Left Result Date: 10/22/2024 CLINICAL DATA:  Knee pain. EXAM: LEFT KNEE - COMPLETE 4+ VIEW COMPARISON:  None Available. FINDINGS: There is no evidence of acute fracture. A radiopaque intramedullary rod, large fixation plate and multiple fixation screws are seen within the distal left femur. No evidence of arthropathy or other focal bone abnormality. There is a large joint effusion. Moderate severity suprapatellar soft tissue swelling is also seen. IMPRESSION: 1. Large joint effusion and moderate severity suprapatellar soft tissue swelling. 2. Prior ORIF of  the distal left femur. Electronically Signed   By: Suzen Dials M.D.   On: 10/22/2024 20:24   CT Head Wo Contrast Result Date: 10/09/2024 EXAM: CT HEAD WITHOUT CONTRAST 10/09/2024 06:34:29 AM TECHNIQUE: CT of the head was performed without the administration of intravenous contrast. Automated exposure control, iterative reconstruction, and/or weight based adjustment of the mA/kV was utilized to reduce the radiation dose to as low as reasonably achievable. COMPARISON: Head CT yesterday. CLINICAL HISTORY: 38 year old male with head trauma and coagulopathy. Repeat to evaluate occult subdural hematoma. Patient is on blood thinners. FINDINGS: BRAIN AND VENTRICLES: No acute hemorrhage. No evidence of acute infarct. No hydrocephalus. No extra-axial collection. No mass effect or midline shift. Resolved intravascular contrast since the CT yesterday. No suspicious intracranial vascular hyperdensity. ORBITS: No acute abnormality. SINUSES: No acute abnormality. SOFT TISSUES AND SKULL: Left vertex scalp soft tissue injury or defect (missing soft tissue series 5 image 65). Underlying calvarium appears intact. No acute scalp soft tissue injury. IMPRESSION: 1. Normal non-contrast CT appearance of the brain. 2. Chronic appearing left vertex Scalp soft tissue injury or resection. Electronically signed by: Helayne Hurst MD 10/09/2024 07:29 AM EDT RP Workstation: [id number]   CT Head Wo Contrast Addendum Date: 10/08/2024 ADDENDUM REPORT: 10/08/2024 19:05 ADDENDUM: Additional clinical information obtained. Patient has history of fall with thrombocytopenia. Slightly hyperdense thickened appearance in the region of the dural venous sinuses is felt likely related to previously administered intravenous contrast but as noted, head CT is limited for assessment of dense vessels and hemorrhage in the presence of intravenous contrast. If clinical suspicion remains high, repeat head CT in 12-24 hours could be obtained. Electronically  Signed   By: Luke Bun M.D.   On: 10/08/2024 19:05   Result Date: 10/08/2024 CLINICAL DATA:  Substance abuse abnormal urine assessment EXAM: CT HEAD WITHOUT CONTRAST CT CERVICAL SPINE WITHOUT CONTRAST TECHNIQUE: Multidetector CT imaging of the head and cervical spine was performed following the standard protocol without intravenous contrast. Multiplanar CT image reconstructions of the cervical spine were also generated. RADIATION DOSE REDUCTION: This exam was performed according to the departmental dose-optimization program which includes automated exposure control, adjustment of the mA and/or kV according to patient size and/or use of iterative reconstruction technique. COMPARISON:  CT brain and cervical spine 09/07/2023 FINDINGS: CT HEAD FINDINGS Brain: No acute territorial infarction, hemorrhage or intracranial mass. The ventricles are nonenlarged. Vascular: Limited evaluation due to presence of previously administered contrast. No unexpected calcification Skull: Normal. Negative for fracture or focal lesion. Sinuses/Orbits: No acute finding. Other: None CT CERVICAL SPINE FINDINGS Alignment: No subluxation.  Facet alignment is normal Skull base and vertebrae: No acute fracture. No primary bone lesion or focal pathologic process. Soft tissues and spinal canal: No prevertebral fluid or swelling. No visible canal hematoma. Disc levels: Relatively patent disc spaces. Mild multilevel foraminal narrowing. Upper chest:  Emphysema Other: None IMPRESSION: 1. Head CT examination limited by previously administered contrast medium. Allowing for this, no CT evidence for acute intracranial abnormality. 2. No acute osseous abnormality of the cervical spine. 3. Emphysema. Emphysema (ICD10-J43.9). Electronically Signed: By: Luke Bun M.D. On: 10/08/2024 18:40   CT Cervical Spine Wo Contrast Addendum Date: 10/08/2024 ADDENDUM REPORT: 10/08/2024 19:05 ADDENDUM: Additional clinical information obtained. Patient has  history of fall with thrombocytopenia. Slightly hyperdense thickened appearance in the region of the dural venous sinuses is felt likely related to previously administered intravenous contrast but as noted, head CT is limited for assessment of dense vessels and hemorrhage in the presence of intravenous contrast. If clinical suspicion remains high, repeat head CT in 12-24 hours could be obtained. Electronically Signed   By: Luke Bun M.D.   On: 10/08/2024 19:05   Result Date: 10/08/2024 CLINICAL DATA:  Substance abuse abnormal urine assessment EXAM: CT HEAD WITHOUT CONTRAST CT CERVICAL SPINE WITHOUT CONTRAST TECHNIQUE: Multidetector CT imaging of the head and cervical spine was performed following the standard protocol without intravenous contrast. Multiplanar CT image reconstructions of the cervical spine were also generated. RADIATION DOSE REDUCTION: This exam was performed according to the departmental dose-optimization program which includes automated exposure control, adjustment of the mA and/or kV according to patient size and/or use of iterative reconstruction technique. COMPARISON:  CT brain and cervical spine 09/07/2023 FINDINGS: CT HEAD FINDINGS Brain: No acute territorial infarction, hemorrhage or intracranial mass. The ventricles are nonenlarged. Vascular: Limited evaluation due to presence of previously administered contrast. No unexpected calcification Skull: Normal. Negative for fracture or focal lesion. Sinuses/Orbits: No acute finding. Other: None CT CERVICAL SPINE FINDINGS Alignment: No subluxation.  Facet alignment is normal Skull base and vertebrae: No acute fracture. No primary bone lesion or focal pathologic process. Soft tissues and spinal canal: No prevertebral fluid or swelling. No visible canal hematoma. Disc levels: Relatively patent disc spaces. Mild multilevel foraminal narrowing. Upper chest: Emphysema Other: None IMPRESSION: 1. Head CT examination limited by previously  administered contrast medium. Allowing for this, no CT evidence for acute intracranial abnormality. 2. No acute osseous abnormality of the cervical spine. 3. Emphysema. Emphysema (ICD10-J43.9). Electronically Signed: By: Luke Bun M.D. On: 10/08/2024 18:40   US  Abdomen Limited RUQ (LIVER/GB) Result Date: 10/08/2024 CLINICAL DATA:  Abdominal pain EXAM: ULTRASOUND ABDOMEN LIMITED RIGHT UPPER QUADRANT COMPARISON:  10/08/2024, 03/11/2024 FINDINGS: Gallbladder: Gallbladder is moderately distended, with multiple small shadowing gallstones layering dependently, as seen on preceding CT exam. No gallbladder wall thickening or pericholecystic fluid. Negative sonographic Murphy sign. Common bile duct: Diameter: 5 mm Liver: Coarsened increased liver echotexture and nodularity of the hepatic capsule consistent with cirrhosis. No focal parenchymal abnormality. Portal vein is patent on color Doppler imaging with normal direction of blood flow towards the liver. Other: None. IMPRESSION: 1. Cholelithiasis, with no sonographic evidence of acute cholecystitis. If acute cholecystitis remains a clinical concern, nuclear medicine hepatobiliary scan could be considered. 2. Cirrhosis. Electronically Signed   By: Ozell Daring M.D.   On: 10/08/2024 16:42   CT ABDOMEN PELVIS W CONTRAST Result Date: 10/08/2024 CLINICAL DATA:  Abdominal pain. EXAM: CT ABDOMEN AND PELVIS WITH CONTRAST TECHNIQUE: Multidetector CT imaging of the abdomen and pelvis was performed using the standard protocol following bolus administration of intravenous contrast. RADIATION DOSE REDUCTION: This exam was performed according to the departmental dose-optimization program which includes automated exposure control, adjustment of the mA and/or kV according to patient size and/or use of iterative reconstruction technique. CONTRAST:  OMNIPAQUE  IOHEXOL  300 MG/ML  SOLN COMPARISON:  CT abdomen pelvis dated 03/10/2024. FINDINGS: Lower chest: The visualized  lung bases are clear. There is coronary vascular calcification. No intra-abdominal free air.  No free fluid. Hepatobiliary: Cirrhosis. No biliary dilatation. Multiple gallstones. Mild thickened appearance of the gallbladder wall may be related to underlying liver disease. Ultrasound may provide better evaluation if there is clinical concern for acute cholecystitis. Pancreas: Unremarkable. No pancreatic ductal dilatation or surrounding inflammatory changes. Spleen: Normal in size without focal abnormality. Adrenals/Urinary Tract: The adrenal glands are unremarkable. The kidneys, visualized ureters, and urinary bladder appear unremarkable. Stomach/Bowel: There is no bowel obstruction or active inflammation. The appendix is normal. Vascular/Lymphatic: Mild aortoiliac atherosclerotic disease. The IVC is unremarkable. No portal venous gas. There is no adenopathy. Dilated left splenorenal shunt. Paraesophageal varices. Reproductive: The prostate and seminal vesicles are grossly remarkable. Other: None Musculoskeletal: No acute osseous pathology. Left femoral intramedullary fixation hardware. IMPRESSION: 1. No acute intra-abdominal or pelvic pathology. 2. Cirrhosis with evidence of portal hypertension. 3. Cholelithiasis. 4. No bowel obstruction. Normal appendix. 5.  Aortic Atherosclerosis (ICD10-I70.0). Electronically Signed   By: Vanetta Chou M.D.   On: 10/08/2024 15:01    Microbiology: Results for orders placed or performed during the hospital encounter of 10/22/24  MRSA Next Gen by PCR, Nasal     Status: Abnormal   Collection Time: 10/25/24  9:29 AM   Specimen: Nasal Mucosa; Nasal Swab  Result Value Ref Range Status   MRSA by PCR Next Gen DETECTED (A) NOT DETECTED Final    Comment: RESULT CALLED TO, READ BACK BY AND VERIFIED WITH: O PUCKETT AT 1155 ON [id number] BY S DALTON (NOTE) The GeneXpert MRSA Assay (FDA approved for NASAL specimens only), is one component of a comprehensive MRSA colonization  surveillance program. It is not intended to diagnose MRSA infection nor to guide or monitor treatment for MRSA infections. Test performance is not FDA approved in patients less than 79 years old. Performed at [facility], [address] [place], [place] [address]     Labs: CBC: Recent Labs  Lab 10/26/24 0443 10/27/24 0430 10/28/24 0320 10/29/24 0408 10/30/24 0342  WBC 3.2* 3.7* 4.3 6.0 5.4  HGB 9.7* 10.6* 10.5* 9.8* 9.8*  HCT 28.5* 31.4* 31.4* 28.1* 29.2*  MCV 108.4* 108.3* 110.2* 108.1* 108.6*  PLT 24* 23* 24* 34* 39*   Basic Metabolic Panel: Recent Labs  Lab 10/26/24 0443 10/27/24 0430 10/28/24 0320  NA 135 139 134*  K 4.7 4.2 4.1  CL 103 104 99  CO2 24 27 25   GLUCOSE 108* 118* 80  BUN 7 9 10   CREATININE 0.84 0.67 0.89  CALCIUM  7.9* 8.3* 8.0*  MG  --  2.0  --    Liver Function Tests: Recent Labs  Lab 10/26/24 0443 10/27/24 0430 10/28/24 0320  AST 67* 58* 59*  ALT 27 25 23   ALKPHOS 161* 119 113  BILITOT 2.9* 2.9* 2.8*  PROT 6.0* 6.1* 5.8*  ALBUMIN  3.0* 2.9* 2.7*   CBG: Recent Labs  Lab 10/24/24 0724 10/24/24 1106 10/24/24 1611 10/26/24 0819 10/26/24 1628  GLUCAP 90 112* 167* 108* 97    Discharge time spent: 40 minutes.  Length of inpatient stay: 6 days  Signed: Carliss LELON Canales, DO [facility] 10/30/2024

## 2024-10-30 NOTE — Plan of Care (Signed)
  Problem: Coping: Goal: Level of anxiety will decrease Outcome: Progressing   Problem: Elimination: Goal: Will not experience complications related to bowel motility Outcome: Progressing Goal: Will not experience complications related to urinary retention Outcome: Progressing   Problem: Pain Managment: Goal: General experience of comfort will improve and/or be controlled Outcome: Progressing   Problem: Safety: Goal: Ability to remain free from injury will improve Outcome: Progressing   Problem: Skin Integrity: Goal: Risk for impaired skin integrity will decrease Outcome: Progressing   Problem: Skin Integrity: Goal: Risk for impaired skin integrity will decrease Outcome: Progressing

## 2024-10-30 NOTE — Plan of Care (Signed)
  Problem: Clinical Measurements: Goal: Ability to maintain clinical measurements within normal limits will improve Outcome: Progressing Goal: Will remain free from infection Outcome: Progressing Goal: Diagnostic test results will improve Outcome: Progressing Goal: Respiratory complications will improve Outcome: Progressing Goal: Cardiovascular complication will be avoided Outcome: Progressing   Problem: Activity: Goal: Risk for activity intolerance will decrease Outcome: Progressing   Problem: Nutrition: Goal: Adequate nutrition will be maintained Outcome: Progressing   Problem: Elimination: Goal: Will not experience complications related to bowel motility Outcome: Progressing Goal: Will not experience complications related to urinary retention Outcome: Progressing   Problem: Pain Managment: Goal: General experience of comfort will improve and/or be controlled Outcome: Progressing   Problem: Safety: Goal: Ability to remain free from injury will improve Outcome: Progressing   Problem: Skin Integrity: Goal: Risk for impaired skin integrity will decrease Outcome: Progressing   Problem: Education: Goal: Knowledge of General Education information will improve Description: Including pain rating scale, medication(s)/side effects and non-pharmacologic comfort measures Outcome: Not Progressing   Problem: Health Behavior/Discharge Planning: Goal: Ability to manage health-related needs will improve Outcome: Not Progressing   Problem: Coping: Goal: Level of anxiety will decrease Outcome: Not Progressing

## 2024-10-31 LAB — METHYLMALONIC ACID, SERUM: Methylmalonic Acid, Quantitative: 128 nmol/L (ref 0–378)

## 2024-11-12 ENCOUNTER — Inpatient Hospital Stay (HOSPITAL_COMMUNITY)
Admission: EM | Admit: 2024-11-12 | Discharge: 2024-11-27 | DRG: 870 | Disposition: A | Payer: MEDICAID | Source: Other Acute Inpatient Hospital | Attending: Family Medicine | Admitting: Family Medicine

## 2024-11-12 ENCOUNTER — Inpatient Hospital Stay (HOSPITAL_COMMUNITY): Payer: MEDICAID

## 2024-11-12 ENCOUNTER — Encounter (HOSPITAL_COMMUNITY): Payer: Self-pay

## 2024-11-12 DIAGNOSIS — A419 Sepsis, unspecified organism: Secondary | ICD-10-CM | POA: Diagnosis not present

## 2024-11-12 DIAGNOSIS — A4102 Sepsis due to Methicillin resistant Staphylococcus aureus: Principal | ICD-10-CM | POA: Diagnosis present

## 2024-11-12 DIAGNOSIS — J9601 Acute respiratory failure with hypoxia: Principal | ICD-10-CM | POA: Diagnosis present

## 2024-11-12 DIAGNOSIS — Z91040 Latex allergy status: Secondary | ICD-10-CM

## 2024-11-12 DIAGNOSIS — F17210 Nicotine dependence, cigarettes, uncomplicated: Secondary | ICD-10-CM | POA: Diagnosis present

## 2024-11-12 DIAGNOSIS — E871 Hypo-osmolality and hyponatremia: Secondary | ICD-10-CM | POA: Diagnosis present

## 2024-11-12 DIAGNOSIS — Z23 Encounter for immunization: Secondary | ICD-10-CM | POA: Diagnosis not present

## 2024-11-12 DIAGNOSIS — D696 Thrombocytopenia, unspecified: Secondary | ICD-10-CM | POA: Diagnosis present

## 2024-11-12 DIAGNOSIS — F10131 Alcohol abuse with withdrawal delirium: Secondary | ICD-10-CM | POA: Diagnosis present

## 2024-11-12 DIAGNOSIS — D61818 Other pancytopenia: Secondary | ICD-10-CM | POA: Diagnosis not present

## 2024-11-12 DIAGNOSIS — Z720 Tobacco use: Secondary | ICD-10-CM | POA: Diagnosis not present

## 2024-11-12 DIAGNOSIS — Z79899 Other long term (current) drug therapy: Secondary | ICD-10-CM

## 2024-11-12 DIAGNOSIS — Z6821 Body mass index (BMI) 21.0-21.9, adult: Secondary | ICD-10-CM

## 2024-11-12 DIAGNOSIS — E8729 Other acidosis: Secondary | ICD-10-CM | POA: Diagnosis present

## 2024-11-12 DIAGNOSIS — Y848 Other medical procedures as the cause of abnormal reaction of the patient, or of later complication, without mention of misadventure at the time of the procedure: Secondary | ICD-10-CM | POA: Diagnosis present

## 2024-11-12 DIAGNOSIS — E870 Hyperosmolality and hypernatremia: Secondary | ICD-10-CM | POA: Diagnosis present

## 2024-11-12 DIAGNOSIS — J80 Acute respiratory distress syndrome: Secondary | ICD-10-CM | POA: Diagnosis present

## 2024-11-12 DIAGNOSIS — F19131 Other psychoactive substance abuse with withdrawal delirium: Secondary | ICD-10-CM | POA: Diagnosis present

## 2024-11-12 DIAGNOSIS — G9341 Metabolic encephalopathy: Secondary | ICD-10-CM | POA: Diagnosis present

## 2024-11-12 DIAGNOSIS — H9190 Unspecified hearing loss, unspecified ear: Secondary | ICD-10-CM | POA: Diagnosis present

## 2024-11-12 DIAGNOSIS — D638 Anemia in other chronic diseases classified elsewhere: Secondary | ICD-10-CM | POA: Diagnosis present

## 2024-11-12 DIAGNOSIS — J690 Pneumonitis due to inhalation of food and vomit: Secondary | ICD-10-CM | POA: Diagnosis present

## 2024-11-12 DIAGNOSIS — R739 Hyperglycemia, unspecified: Secondary | ICD-10-CM | POA: Diagnosis not present

## 2024-11-12 DIAGNOSIS — F1121 Opioid dependence, in remission: Secondary | ICD-10-CM | POA: Diagnosis present

## 2024-11-12 DIAGNOSIS — H7093 Unspecified mastoiditis, bilateral: Secondary | ICD-10-CM | POA: Diagnosis present

## 2024-11-12 DIAGNOSIS — E440 Moderate protein-calorie malnutrition: Secondary | ICD-10-CM | POA: Insufficient documentation

## 2024-11-12 DIAGNOSIS — I4719 Other supraventricular tachycardia: Secondary | ICD-10-CM | POA: Diagnosis present

## 2024-11-12 DIAGNOSIS — R6520 Severe sepsis without septic shock: Secondary | ICD-10-CM | POA: Diagnosis present

## 2024-11-12 DIAGNOSIS — J15212 Pneumonia due to Methicillin resistant Staphylococcus aureus: Secondary | ICD-10-CM | POA: Diagnosis present

## 2024-11-12 DIAGNOSIS — E43 Unspecified severe protein-calorie malnutrition: Secondary | ICD-10-CM | POA: Diagnosis present

## 2024-11-12 DIAGNOSIS — I5181 Takotsubo syndrome: Secondary | ICD-10-CM | POA: Diagnosis present

## 2024-11-12 DIAGNOSIS — J342 Deviated nasal septum: Secondary | ICD-10-CM | POA: Diagnosis present

## 2024-11-12 DIAGNOSIS — T510X1A Toxic effect of ethanol, accidental (unintentional), initial encounter: Secondary | ICD-10-CM | POA: Diagnosis present

## 2024-11-12 DIAGNOSIS — R9431 Abnormal electrocardiogram [ECG] [EKG]: Secondary | ICD-10-CM | POA: Diagnosis not present

## 2024-11-12 DIAGNOSIS — E861 Hypovolemia: Secondary | ICD-10-CM | POA: Diagnosis present

## 2024-11-12 DIAGNOSIS — K2970 Gastritis, unspecified, without bleeding: Secondary | ICD-10-CM | POA: Diagnosis present

## 2024-11-12 DIAGNOSIS — E876 Hypokalemia: Secondary | ICD-10-CM | POA: Diagnosis present

## 2024-11-12 DIAGNOSIS — J95851 Ventilator associated pneumonia: Secondary | ICD-10-CM | POA: Diagnosis present

## 2024-11-12 DIAGNOSIS — K7031 Alcoholic cirrhosis of liver with ascites: Secondary | ICD-10-CM | POA: Diagnosis present

## 2024-11-12 DIAGNOSIS — T502X5A Adverse effect of carbonic-anhydrase inhibitors, benzothiadiazides and other diuretics, initial encounter: Secondary | ICD-10-CM | POA: Diagnosis present

## 2024-11-12 DIAGNOSIS — I472 Ventricular tachycardia, unspecified: Secondary | ICD-10-CM | POA: Diagnosis not present

## 2024-11-12 DIAGNOSIS — E781 Pure hyperglyceridemia: Secondary | ICD-10-CM | POA: Diagnosis present

## 2024-11-12 DIAGNOSIS — R404 Transient alteration of awareness: Secondary | ICD-10-CM | POA: Diagnosis not present

## 2024-11-12 DIAGNOSIS — F32A Depression, unspecified: Secondary | ICD-10-CM | POA: Diagnosis present

## 2024-11-12 DIAGNOSIS — Z781 Physical restraint status: Secondary | ICD-10-CM

## 2024-11-12 DIAGNOSIS — F1010 Alcohol abuse, uncomplicated: Secondary | ICD-10-CM | POA: Diagnosis not present

## 2024-11-12 DIAGNOSIS — R410 Disorientation, unspecified: Secondary | ICD-10-CM | POA: Diagnosis not present

## 2024-11-12 DIAGNOSIS — N179 Acute kidney failure, unspecified: Secondary | ICD-10-CM | POA: Diagnosis present

## 2024-11-12 DIAGNOSIS — I5021 Acute systolic (congestive) heart failure: Secondary | ICD-10-CM | POA: Diagnosis present

## 2024-11-12 DIAGNOSIS — R001 Bradycardia, unspecified: Secondary | ICD-10-CM | POA: Diagnosis present

## 2024-11-12 DIAGNOSIS — F1191 Opioid use, unspecified, in remission: Secondary | ICD-10-CM | POA: Diagnosis not present

## 2024-11-12 DIAGNOSIS — M779 Enthesopathy, unspecified: Secondary | ICD-10-CM | POA: Diagnosis present

## 2024-11-12 DIAGNOSIS — K7682 Hepatic encephalopathy: Secondary | ICD-10-CM | POA: Diagnosis present

## 2024-11-12 DIAGNOSIS — I471 Supraventricular tachycardia, unspecified: Secondary | ICD-10-CM | POA: Diagnosis not present

## 2024-11-12 DIAGNOSIS — I5020 Unspecified systolic (congestive) heart failure: Secondary | ICD-10-CM | POA: Diagnosis not present

## 2024-11-12 DIAGNOSIS — F419 Anxiety disorder, unspecified: Secondary | ICD-10-CM | POA: Diagnosis present

## 2024-11-12 DIAGNOSIS — R4182 Altered mental status, unspecified: Secondary | ICD-10-CM | POA: Diagnosis not present

## 2024-11-12 DIAGNOSIS — F410 Panic disorder [episodic paroxysmal anxiety] without agoraphobia: Secondary | ICD-10-CM | POA: Diagnosis present

## 2024-11-12 DIAGNOSIS — Z8659 Personal history of other mental and behavioral disorders: Secondary | ICD-10-CM | POA: Diagnosis not present

## 2024-11-12 DIAGNOSIS — Z743 Need for continuous supervision: Secondary | ICD-10-CM | POA: Diagnosis not present

## 2024-11-12 DIAGNOSIS — K7030 Alcoholic cirrhosis of liver without ascites: Secondary | ICD-10-CM | POA: Diagnosis not present

## 2024-11-12 DIAGNOSIS — R0603 Acute respiratory distress: Secondary | ICD-10-CM | POA: Diagnosis not present

## 2024-11-12 DIAGNOSIS — Z8249 Family history of ischemic heart disease and other diseases of the circulatory system: Secondary | ICD-10-CM

## 2024-11-12 DIAGNOSIS — T403X1A Poisoning by methadone, accidental (unintentional), initial encounter: Secondary | ICD-10-CM | POA: Diagnosis present

## 2024-11-12 DIAGNOSIS — F10930 Alcohol use, unspecified with withdrawal, uncomplicated: Secondary | ICD-10-CM | POA: Diagnosis not present

## 2024-11-12 DIAGNOSIS — Z8619 Personal history of other infectious and parasitic diseases: Secondary | ICD-10-CM

## 2024-11-12 LAB — POCT I-STAT 7, (LYTES, BLD GAS, ICA,H+H)
Acid-Base Excess: 6 mmol/L — ABNORMAL HIGH (ref 0.0–2.0)
Bicarbonate: 30.8 mmol/L — ABNORMAL HIGH (ref 20.0–28.0)
Calcium, Ion: 1.19 mmol/L (ref 1.15–1.40)
HCT: 30 % — ABNORMAL LOW (ref 39.0–52.0)
Hemoglobin: 10.2 g/dL — ABNORMAL LOW (ref 13.0–17.0)
O2 Saturation: 99 %
Patient temperature: 101
Potassium: 3.3 mmol/L — ABNORMAL LOW (ref 3.5–5.1)
Sodium: 142 mmol/L (ref 135–145)
TCO2: 32 mmol/L (ref 22–32)
pCO2 arterial: 47.2 mmHg (ref 32–48)
pH, Arterial: 7.428 (ref 7.35–7.45)
pO2, Arterial: 124 mmHg — ABNORMAL HIGH (ref 83–108)

## 2024-11-12 LAB — CBC
HCT: 25.3 % — ABNORMAL LOW (ref 39.0–52.0)
Hemoglobin: 8.9 g/dL — ABNORMAL LOW (ref 13.0–17.0)
MCH: 38 pg — ABNORMAL HIGH (ref 26.0–34.0)
MCHC: 35.2 g/dL (ref 30.0–36.0)
MCV: 108.1 fL — ABNORMAL HIGH (ref 80.0–100.0)
Platelets: 56 K/uL — ABNORMAL LOW (ref 150–400)
RBC: 2.34 MIL/uL — ABNORMAL LOW (ref 4.22–5.81)
RDW: 13.9 % (ref 11.5–15.5)
WBC: 11.8 K/uL — ABNORMAL HIGH (ref 4.0–10.5)
nRBC: 0 % (ref 0.0–0.2)

## 2024-11-12 LAB — COMPREHENSIVE METABOLIC PANEL WITH GFR
ALT: 19 U/L (ref 0–44)
AST: 99 U/L — ABNORMAL HIGH (ref 15–41)
Albumin: 1.9 g/dL — ABNORMAL LOW (ref 3.5–5.0)
Alkaline Phosphatase: 60 U/L (ref 38–126)
Anion gap: 13 (ref 5–15)
BUN: 28 mg/dL — ABNORMAL HIGH (ref 6–20)
CO2: 24 mmol/L (ref 22–32)
Calcium: 7.9 mg/dL — ABNORMAL LOW (ref 8.9–10.3)
Chloride: 100 mmol/L (ref 98–111)
Creatinine, Ser: 1.28 mg/dL — ABNORMAL HIGH (ref 0.61–1.24)
GFR, Estimated: >60 mL/min (ref >60)
Glucose, Bld: 95 mg/dL (ref 70–99)
Potassium: 4.1 mmol/L (ref 3.5–5.1)
Sodium: 137 mmol/L (ref 135–145)
Total Bilirubin: 3.3 mg/dL — ABNORMAL HIGH (ref 0.0–1.2)
Total Protein: 4.7 g/dL — ABNORMAL LOW (ref 6.5–8.1)

## 2024-11-12 LAB — GLUCOSE, CAPILLARY
Glucose-Capillary: 105 mg/dL — ABNORMAL HIGH (ref 70–99)
Glucose-Capillary: 104 mg/dL — ABNORMAL HIGH (ref 70–99)

## 2024-11-12 LAB — LACTIC ACID, PLASMA: Lactic Acid, Venous: 2.1 mmol/L (ref 0.5–1.9)

## 2024-11-12 LAB — MAGNESIUM: Magnesium: 2 mg/dL (ref 1.7–2.4)

## 2024-11-12 LAB — MRSA NEXT GEN BY PCR, NASAL: MRSA by PCR Next Gen: NOT DETECTED

## 2024-11-12 MED ORDER — POLYETHYLENE GLYCOL 3350 17 G PO PACK
17.0000 g | PACK | Freq: Every day | ORAL | Status: DC
  Filled 2024-11-12: qty 1

## 2024-11-12 MED ORDER — POLYETHYLENE GLYCOL 3350 17 G PO PACK: 17.0000 g | PACK | Freq: Every day | ORAL | Status: DC | PRN

## 2024-11-12 MED ORDER — LACTULOSE 10 GM/15ML PO SOLN
20.0000 g | Freq: Three times a day (TID) | ORAL | Status: DC
  Administered 2024-11-12: 20 g via ORAL
  Filled 2024-11-12: qty 30

## 2024-11-12 MED ORDER — SODIUM CHLORIDE 0.9 % IV SOLN
500.0000 mg | INTRAVENOUS | Status: AC
  Administered 2024-11-12 – 2024-11-15 (×3): 500 mg via INTRAVENOUS
  Filled 2024-11-12 (×3): qty 5

## 2024-11-12 MED ORDER — METHYLPREDNISOLONE SODIUM SUCC 40 MG IJ SOLR
40.0000 mg | Freq: Two times a day (BID) | INTRAMUSCULAR | Status: DC
  Administered 2024-11-12 – 2024-11-13 (×2): 40 mg via INTRAVENOUS
  Filled 2024-11-12 (×2): qty 1

## 2024-11-12 MED ORDER — PIPERACILLIN-TAZOBACTAM 3.375 G IVPB
3.3750 g | Freq: Three times a day (TID) | INTRAVENOUS | Status: DC
  Administered 2024-11-12 – 2024-11-17 (×14): 3.375 g via INTRAVENOUS
  Filled 2024-11-12 (×14): qty 50

## 2024-11-12 MED ORDER — FAMOTIDINE 20 MG PO TABS
20.0000 mg | ORAL_TABLET | Freq: Two times a day (BID) | ORAL | Status: DC
  Administered 2024-11-12 – 2024-11-14 (×4): 20 mg
  Filled 2024-11-12 (×4): qty 1

## 2024-11-12 MED ORDER — DOCUSATE SODIUM 50 MG/5ML PO LIQD
100.0000 mg | Freq: Two times a day (BID) | ORAL | Status: DC
  Administered 2024-11-12 – 2024-11-14 (×4): 100 mg
  Filled 2024-11-12 (×4): qty 10

## 2024-11-12 MED ORDER — ADULT MULTIVITAMIN W/MINERALS CH
1.0000 | ORAL_TABLET | Freq: Every day | ORAL | Status: DC
  Administered 2024-11-12 – 2024-11-23 (×11): 1
  Filled 2024-11-12 (×12): qty 1

## 2024-11-12 MED ORDER — ACETAMINOPHEN 325 MG PO TABS
650.0000 mg | ORAL_TABLET | ORAL | Status: DC | PRN
  Administered 2024-11-13 – 2024-11-16 (×2): 650 mg
  Filled 2024-11-12 (×2): qty 2

## 2024-11-12 MED ORDER — HEPARIN SODIUM (PORCINE) 5000 UNIT/ML IJ SOLN
5000.0000 [IU] | Freq: Three times a day (TID) | INTRAMUSCULAR | Status: DC
  Filled 2024-11-12: qty 1

## 2024-11-12 MED ORDER — DOCUSATE SODIUM 100 MG PO CAPS: 100.0000 mg | ORAL_CAPSULE | Freq: Two times a day (BID) | ORAL | Status: DC | PRN

## 2024-11-12 MED ORDER — IPRATROPIUM-ALBUTEROL 0.5-2.5 (3) MG/3ML IN SOLN
3.0000 mL | RESPIRATORY_TRACT | Status: DC | PRN
  Administered 2024-11-15: 3 mL via RESPIRATORY_TRACT
  Filled 2024-11-12: qty 3

## 2024-11-12 MED ORDER — ORAL CARE MOUTH RINSE
15.0000 mL | OROMUCOSAL | Status: DC
  Administered 2024-11-13 – 2024-11-14 (×22): 15 mL via OROMUCOSAL

## 2024-11-12 MED ORDER — ORAL CARE MOUTH RINSE: 15.0000 mL | OROMUCOSAL | Status: DC | PRN

## 2024-11-12 MED ORDER — INSULIN ASPART 100 UNIT/ML IJ SOLN
2.0000 [IU] | INTRAMUSCULAR | Status: DC
  Administered 2024-11-13 (×2): 2 [IU] via SUBCUTANEOUS
  Filled 2024-11-12: qty 2

## 2024-11-12 MED ORDER — VITAMIN D 25 MCG (1000 UNIT) PO TABS: 1000.0000 [IU] | ORAL_TABLET | Freq: Every day | ORAL | Status: DC

## 2024-11-12 MED ORDER — POTASSIUM CHLORIDE 10 MEQ/100ML IV SOLN: 10.0000 meq | INTRAVENOUS | Status: DC

## 2024-11-12 MED ORDER — CHLORHEXIDINE GLUCONATE CLOTH 2 % EX PADS
6.0000 | MEDICATED_PAD | Freq: Every day | CUTANEOUS | Status: DC
  Administered 2024-11-12 – 2024-11-27 (×16): 6 via TOPICAL

## 2024-11-12 MED ORDER — FOLIC ACID 1 MG PO TABS
1.0000 mg | ORAL_TABLET | Freq: Every day | ORAL | Status: DC
  Administered 2024-11-12 – 2024-11-14 (×3): 1 mg
  Filled 2024-11-12 (×3): qty 1

## 2024-11-12 MED ORDER — PROPOFOL 1000 MG/100ML IV EMUL
0.0000 ug/kg/min | INTRAVENOUS | Status: DC
  Administered 2024-11-12: 55 ug/kg/min via INTRAVENOUS
  Administered 2024-11-13: 50 ug/kg/min via INTRAVENOUS
  Administered 2024-11-13: 55 ug/kg/min via INTRAVENOUS
  Filled 2024-11-12 (×3): qty 100

## 2024-11-12 MED ORDER — NICOTINE 14 MG/24HR TD PT24
14.0000 mg | MEDICATED_PATCH | Freq: Every day | TRANSDERMAL | Status: DC
  Administered 2024-11-12 – 2024-11-27 (×16): 14 mg via TRANSDERMAL
  Filled 2024-11-12 (×16): qty 1

## 2024-11-12 MED ORDER — FENTANYL CITRATE (PF) 50 MCG/ML IJ SOSY: 25.0000 ug | PREFILLED_SYRINGE | Freq: Once | INTRAMUSCULAR | Status: DC

## 2024-11-12 MED ORDER — FENTANYL BOLUS VIA INFUSION
25.0000 ug | INTRAVENOUS | Status: DC | PRN
  Administered 2024-11-13 (×5): 50 ug via INTRAVENOUS
  Administered 2024-11-14 (×2): 100 ug via INTRAVENOUS
  Administered 2024-11-14 (×2): 50 ug via INTRAVENOUS
  Administered 2024-11-14 (×2): 100 ug via INTRAVENOUS

## 2024-11-12 MED ORDER — THIAMINE HCL 100 MG/ML IJ SOLN
100.0000 mg | Freq: Every day | INTRAMUSCULAR | Status: DC
  Administered 2024-11-12: 100 mg via INTRAVENOUS
  Filled 2024-11-12: qty 2

## 2024-11-12 MED ORDER — FENTANYL 2500MCG IN NS 250ML (10MCG/ML) PREMIX INFUSION
0.0000 ug/h | INTRAVENOUS | Status: DC
  Administered 2024-11-12 – 2024-11-14 (×2): 50 ug/h via INTRAVENOUS
  Filled 2024-11-12 (×2): qty 250

## 2024-11-12 NOTE — H&P (Addendum)
 NAME:  Gregory Murphy, MRN:  [id number], DOB:  November 26, 1986, LOS: 0 ADMISSION DATE:  (Not on file), CONSULTATION DATE:  11/27 REFERRING MD:  UNK Rouse provider, CHIEF COMPLAINT:  ARDS; alcoholic liver cirrhosis   History of Present Illness:  Patient is a 38 yo M w/ pertinent PMH alcohol  abuse, alcoholic liver cirrhosis, chronic pancytopenia presents to [facility] on 11/27 with ards.  Patient has prior traumatic injury to left femur requiring rod placement from left hip to knee.  Has had prior left knee effusion requiring arthrocentesis back in July 2025.  Patient recently admitted on 11/7-11/14 for left knee effusion requiring arthrocentesis.  While in hospital patient became encephalopathic likely alcohol  withdrawal requiring brief Precedex , Librium  taper, CIWA protocol.  Patient recently started on methadone 3 days prior to this admit.  Patient admitted to [facility] on 11/20 with AMS presumed 2/2 to polysubstance abuse and possible withdrawal. Intbubated for airway protection.  Patient required intubation.  CT chest with b/l peripheral and perivascular groundglass . Ct head no acute abnormality. Patient was extubated on 11/25. While in hospital worsening withdrawal despite ativan  and phenobarb, started on precedex . Developed respiratory failure and reintubated on 11/27.  Patient with increasing O2 requirements and ARDS.  On 11/27 transferred to [facility] for further management.  Pertinent  Medical History   Past Medical History:  Diagnosis Date   Alcohol  withdrawal (HCC) 03/01/2024   Alcoholic cirrhosis of liver with ascites (HCC) 03/02/2024   Alcoholic gastritis 03/03/2024   Alcoholic hepatitis with ascites (HCC) 03/02/2024   Brain injury (HCC)    Cirrhosis with alcoholism (HCC)    Critical polytrauma 12/17/2021   Decompensated cirrhosis (HCC) 03/10/2024   Depression    Elevated AFP 03/03/2024   HCV antibody positive 03/03/2024   Insomnia    Macrocytic anemia 03/03/2024    Malnutrition of moderate degree 12/21/2021   Panic attack    Severe thrombocytopenia 03/03/2024   Splenic rupture 09/07/2023     Significant Hospital Events: Including procedures, antibiotic start and stop dates in addition to other pertinent events   11/27 admitted to [facility] with ARDS intubated from [facility]  Interim History / Subjective:  See above  Objective    There were no vitals taken for this visit.       No intake or output data in the 24 hours ending 11/12/24 2011 There were no vitals filed for this visit.  Examination: General:  critically ill appearing on mech vent HEENT: MM pink/moist; ETT in place Neuro: sedate; perrl CV: s1s2, RRR, no m/r/g PULM:  dim rhonchi BS bilaterally; on mech vent PRVC GI: soft, bsx4 active  Extremities: warm/dry, no edema  Skin: no rashes or lesions    Resolved problem list   Assessment and Plan   Acute respiratory failure with hypoxia Possible ARDS Possible pna Plan: -CXR on arrival -LTVV strategy with tidal volumes of 6-8 cc/kg ideal body weight -check ABG and adjust settings accordingly  -Goal plateau pressures less than 30 and driving pressures less than 15 -Wean PEEP/FiO2 for SpO2 >92% -VAP bundle in place -Daily SAT and SBT -PAD protocol in place -wean sedation for RASS goal 0 to -1 -Follow intermittent CXR and ABG PRN -zosyn /vanc; send trach aspirate and mrsa pcr -monitor UOP; consider lasix  -steroids x 5 days  Acute metabolic encephalopathy  Alcohol  withdrawal Hepatic encephalopathy: ammonia 91 on 11/21 History of alcohol  abuse and previous withdrawal -ct head 11/19 no acute abnormality Plan: - Currently on sedation; wean for RASS 0 to -1 -  limit sedating meds - check ammonia; lactulose  - Thiamine , folic acid , MVI  AKI Hyponatremia Hypophosphatemia Hypomagnesemia Plan: -Trend BMP / urinary output -Replace electrolytes as indicated -Avoid nephrotoxic agents, ensure adequate renal  perfusion  Alcoholic liver cirrhosis -Follows with GI outpatient Plan: - Avoid hepatotoxic agents - check ammonia and lactulose   Chronic pancytopenia Plan: - Trend CBC  Tobacco abuse Plan: - Tobacco cessation when appropriate - Nicotine  patch  Hx of HTN? Plan: - hold Metoprolol     SCDs for dvt ppx; cbc pending; if platelets stable consider heparin  subcu  H2B for gi ppx  Family updated over phone on 11/27  Labs   CBC: No results for input(s): WBC, NEUTROABS, HGB, HCT, MCV, PLT in the last 168 hours.  Basic Metabolic Panel: No results for input(s): NA, K, CL, CO2, GLUCOSE, BUN, CREATININE, CALCIUM , MG, PHOS in the last 168 hours. GFR: CrCl cannot be calculated (Unknown ideal weight.). No results for input(s): PROCALCITON, WBC, LATICACIDVEN in the last 168 hours.  Liver Function Tests: No results for input(s): AST, ALT, ALKPHOS, BILITOT, PROT, ALBUMIN  in the last 168 hours. No results for input(s): LIPASE, AMYLASE in the last 168 hours. No results for input(s): AMMONIA in the last 168 hours.  ABG    Component Value Date/Time   HCO3 34.2 (H) 03/02/2024 1101   TCO2 27 12/17/2021 1016   O2SAT 49.8 03/02/2024 1101     Coagulation Profile: No results for input(s): INR, PROTIME in the last 168 hours.  Cardiac Enzymes: No results for input(s): CKTOTAL, CKMB, CKMBINDEX, TROPONINI in the last 168 hours.  HbA1C: Hgb A1c MFr Bld  Date/Time Value Ref Range Status  10/22/2024 11:54 PM 4.2 (L) 4.8 - 5.6 % Final    Comment:    (NOTE) Diagnosis of Diabetes The following HbA1c ranges recommended by the American Diabetes Association (ADA) may be used as an aid in the diagnosis of diabetes mellitus.  Hemoglobin             Suggested A1C NGSP%              Diagnosis  <5.7                   Non Diabetic  5.7-6.4                Pre-Diabetic  >6.4                   Diabetic  <7.0                    Glycemic control for                       adults with diabetes.      CBG: No results for input(s): GLUCAP in the last 168 hours.  Review of Systems:   Patient is sedate/intubated; therefore, history has been obtained from chart review.    Past Medical History:  He,  has a past medical history of Alcohol  withdrawal (HCC) (03/01/2024), Alcoholic cirrhosis of liver with ascites (HCC) (03/02/2024), Alcoholic gastritis (03/03/2024), Alcoholic hepatitis with ascites (HCC) (03/02/2024), Brain injury (HCC), Cirrhosis with alcoholism (HCC), Critical polytrauma (12/17/2021), Decompensated cirrhosis (HCC) (03/10/2024), Depression, Elevated AFP (03/03/2024), HCV antibody positive (03/03/2024), Insomnia, Macrocytic anemia (03/03/2024), Malnutrition of moderate degree (12/21/2021), Panic attack, Severe thrombocytopenia (03/03/2024), and Splenic rupture (09/07/2023).   Surgical History:   Past Surgical History:  Procedure Laterality Date   ABDOMINAL SURGERY     CLOSED REDUCTION  FINGER WITH PERCUTANEOUS PINNING Left 02/04/2024   Procedure: LEFT THUMB CLOSED REDUCTION FINGER WITH PERCUTANEOUS PINNing;  Surgeon: Arlinda Buster, MD;  Location: [facility];  Service: Orthopedics;  Laterality: Left;   DISTAL FEMUR BONE BRIDGE EXCISION Left    FEMUR CLOSED REDUCTION Left 12/17/2021   Procedure: CLOSED REDUCTION FEMORAL SHAFT;  Surgeon: Barton Drape, MD;  Location: [facility];  Service: Orthopedics;  Laterality: Left;   FOREARM SURGERY Left    x4-metal rods placed   I & D EXTREMITY Left 02/04/2024   Procedure: IRRIGATION AND DEBRIDEMENT OF LEFT THUMB;  Surgeon: Arlinda Buster, MD;  Location: [facility];  Service: Orthopedics;  Laterality: Left;   IR PARACENTESIS  03/02/2024   LEG SURGERY Right 2023   NAILBED REPAIR Left 02/04/2024   Procedure: LEFT THUMB NAILBED REPAIR;  Surgeon: Arlinda Buster, MD;  Location: [facility];  Service: Orthopedics;  Laterality:  Left;   ORIF FEMUR FRACTURE Left 12/20/2021   Procedure: OPEN REDUCTION INTERNAL FIXATION (ORIF) DISTAL FEMUR FRACTURE;  Surgeon: Kendal Franky SQUIBB, MD;  Location: [facility];  Service: Orthopedics;  Laterality: Left;   TIBIA IM NAIL INSERTION Bilateral 12/17/2021   Procedure: RIGHT INTRAMEDULLARY (IM) NAIL TIBIAL; RIGHT CLOSED TREATMENT OF FIBULA FRACTURE; LEFT CLOSED REDUCTION SPLINTING OF PERIPROSTHETIC  SUPRACONDYLAR FEMUR FRACTURE;  Surgeon: Barton Drape, MD;  Location: [facility];  Service: Orthopedics;  Laterality: Bilateral;   WRIST ARTHROSCOPY WITH CARPOMETACARPEL [facility]) ARTHROPLASTY Left 03/21/2022   Procedure: left wrist arthroscopy with debridement as needed;  Surgeon: Sissy Cough, MD;  Location: [facility];  Service: Orthopedics;  Laterality: Left;  just needs 60 mins for surgery   WRIST SURGERY Right    pins     Social History:   reports that he has been smoking cigarettes. He has a 7 pack-year smoking history. He has been exposed to tobacco smoke. He has never used smokeless tobacco. He reports current alcohol  use of about 21.0 standard drinks of alcohol  per week. He reports that he does not currently use drugs.   Family History:  His family history is not on file.   Allergies Allergies  Allergen Reactions   Latex Itching    Gloves      Home Medications  Prior to Admission medications   Medication Sig Start Date End Date Taking? Authorizing Provider  chlordiazePOXIDE  (LIBRIUM ) 25 MG capsule 50mg  PO TID x 1D, then 25-50mg  PO BID X 1D, then 25-50mg  PO QD X 1D 10/30/24   Arlon Carliss ORN, DO  folic acid  (FOLVITE ) 1 MG tablet Take 1 tablet (1 mg total) by mouth daily. 10/31/24   Arlon Carliss ORN, DO  metoprolol  succinate (TOPROL -XL) 50 MG 24 hr tablet Take 1 tablet (50 mg total) by mouth daily. Take with or immediately following a meal. 10/31/24   Arlon Carliss ORN, DO  Multiple Vitamins-Minerals (MULTIVITAMIN WITH MINERALS) tablet Take 1 tablet by mouth daily.  10/09/24 12/08/24  Elnor Jayson LABOR, DO  naltrexone  (DEPADE) 50 MG tablet Take 1 tablet (50 mg total) by mouth daily. 10/31/24   Arlon Carliss ORN, DO  nicotine  (NICODERM CQ  - DOSED IN MG/24 HOURS) 21 mg/24hr patch Place 1 patch (21 mg total) onto the skin daily. 10/31/24   Arlon Carliss ORN, DO  thiamine  250 MG TABS Take 500 mg by mouth daily. 10/31/24   Arlon Carliss ORN, DO  traMADol  (ULTRAM ) 50 MG tablet Take 1 tablet (50 mg total) by mouth every 6 (six) hours as needed for severe pain (  pain score 7-10) or moderate pain (pain score 4-6). 10/30/24   Arlon Carliss ORN, DO  Vitamin D , Ergocalciferol , (DRISDOL ) 1.25 MG (50000 UNIT) CAPS capsule Take 1 capsule (50,000 Units total) by mouth every 7 (seven) days. 11/01/24   Arlon Carliss ORN, DO     Critical care time: 45 minutes    JD Emilio RIGGERS [facility] 11/12/2024, 8:11 PM  Please see [email or web address] for pager details.  From 7A-7P if no response, please call [phone]. After hours, please call ELink [phone].

## 2024-11-12 NOTE — Progress Notes (Signed)
 eLink Physician-Brief Progress Note Patient Name: Gregory Murphy DOB: 11/15/1986 MRN: [id number]   Date of Service  11/12/2024  HPI/Events of Note  Outside hospital transfer arrived to this ICU on vent. Stable vitals while on 100%/peep 10, VT 420 and RR 18. No distress on camera  eICU Interventions  Ground team notified and will admit.      Intervention Category Major Interventions: Respiratory failure - evaluation and management Evaluation Type: New Patient Evaluation  Cheryll KANDICE Bang 11/12/2024, 9:02 PM

## 2024-11-12 NOTE — Progress Notes (Signed)
 PHARMACY ANTIBIOTIC CONSULT NOTE   Gregory Murphy a 38 y.o. male admitted with acute hypoxic respiratory failure. Patioent a transfer from [facility] has been consulted for Vancomycin  and Zosyn  dosing.  Scr 1.29 from [facility] (Scr 0.89 10/28/2024)  Per paper chart, patient was on a vancomycin  1g IV Q12H regimen at [facility] with last dose administered ~1700 11/27. Patient's PLT were also 45 per his paper chart, precluding the use of linezolid. Per AUC calculator, patient's ideal regimen is Vancomycin  1g IV Q24H to provide an AUC of 450. Pt seems to have been on more of a trough-based regimen. Based on timing of last dose, possible AKI, and expected ideal dosing regimen, will hold off on resuming vancomycin  at this time and obtain a level with AM labs.   Estimated Creatinine Clearance: 83.2 mL/min (by C-G formula based on SCr of 0.89 mg/dL).  Plan: START Zosyn  3.375 g IV Q8H (EI)  Obtain Vancomycin  random level with AM labs  Monitor renal function, clinical status, de-escalation, C/S, levels as indicated   Allergies:  Allergies  Allergen Reactions   Latex Itching    Gloves     Filed Weights   11/12/24 2052  Weight: 59.2 kg (130 lb 8.2 oz)       Latest Ref Rng & Units 10/30/2024    3:42 AM 10/29/2024    4:08 AM 10/28/2024    3:20 AM  CBC  WBC 4.0 - 10.5 K/uL 5.4  6.0  4.3   Hemoglobin 13.0 - 17.0 g/dL 9.8  9.8  89.4   Hematocrit 39.0 - 52.0 % 29.2  28.1  31.4   Platelets 150 - 400 K/uL 39  34  24     Antibiotics Given (last 72 hours)     None       Thank you for allowing pharmacy to be a part of this patient's care.  Massie Fila, PharmD Clinical Pharmacist  11/12/2024 9:29 PM

## 2024-11-13 ENCOUNTER — Inpatient Hospital Stay (HOSPITAL_COMMUNITY): Payer: MEDICAID

## 2024-11-13 DIAGNOSIS — R0603 Acute respiratory distress: Secondary | ICD-10-CM

## 2024-11-13 DIAGNOSIS — E440 Moderate protein-calorie malnutrition: Secondary | ICD-10-CM | POA: Insufficient documentation

## 2024-11-13 LAB — TRIGLYCERIDES: Triglycerides: 643 mg/dL — ABNORMAL HIGH (ref <150)

## 2024-11-13 LAB — POCT I-STAT 7, (LYTES, BLD GAS, ICA,H+H)
Acid-Base Excess: 6 mmol/L — ABNORMAL HIGH (ref 0.0–2.0)
Bicarbonate: 32.7 mmol/L — ABNORMAL HIGH (ref 20.0–28.0)
Calcium, Ion: 1.21 mmol/L (ref 1.15–1.40)
HCT: 26 % — ABNORMAL LOW (ref 39.0–52.0)
Hemoglobin: 8.8 g/dL — ABNORMAL LOW (ref 13.0–17.0)
O2 Saturation: 100 %
Patient temperature: 99.5
Potassium: 4.2 mmol/L (ref 3.5–5.1)
Sodium: 143 mmol/L (ref 135–145)
TCO2: 34 mmol/L — ABNORMAL HIGH (ref 22–32)
pCO2 arterial: 59.3 mmHg — ABNORMAL HIGH (ref 32–48)
pH, Arterial: 7.351 (ref 7.35–7.45)
pO2, Arterial: 282 mmHg — ABNORMAL HIGH (ref 83–108)

## 2024-11-13 LAB — BASIC METABOLIC PANEL WITH GFR
Anion gap: 14 (ref 5–15)
BUN: 30 mg/dL — ABNORMAL HIGH (ref 6–20)
CO2: 22 mmol/L (ref 22–32)
Calcium: 7.7 mg/dL — ABNORMAL LOW (ref 8.9–10.3)
Chloride: 104 mmol/L (ref 98–111)
Creatinine, Ser: 1.36 mg/dL — ABNORMAL HIGH (ref 0.61–1.24)
GFR, Estimated: >60 mL/min (ref >60)
Glucose, Bld: 134 mg/dL — ABNORMAL HIGH (ref 70–99)
Potassium: 4.3 mmol/L (ref 3.5–5.1)
Sodium: 140 mmol/L (ref 135–145)

## 2024-11-13 LAB — ECHOCARDIOGRAM COMPLETE
Area-P 1/2: 4.68 cm2
Calc EF: 61.1 %
S' Lateral: 3.6 cm
Single Plane A2C EF: 66 %
Single Plane A4C EF: 55.9 %
Weight: 2088.2 [oz_av]

## 2024-11-13 LAB — CBC
HCT: 25.3 % — ABNORMAL LOW (ref 39.0–52.0)
Hemoglobin: 8.6 g/dL — ABNORMAL LOW (ref 13.0–17.0)
MCH: 37.4 pg — ABNORMAL HIGH (ref 26.0–34.0)
MCHC: 34 g/dL (ref 30.0–36.0)
MCV: 110 fL — ABNORMAL HIGH (ref 80.0–100.0)
Platelets: 37 K/uL — ABNORMAL LOW (ref 150–400)
RBC: 2.3 MIL/uL — ABNORMAL LOW (ref 4.22–5.81)
RDW: 13.9 % (ref 11.5–15.5)
WBC: 10.8 K/uL — ABNORMAL HIGH (ref 4.0–10.5)
nRBC: 0 % (ref 0.0–0.2)

## 2024-11-13 LAB — GLUCOSE, CAPILLARY
Glucose-Capillary: 137 mg/dL — ABNORMAL HIGH (ref 70–99)
Glucose-Capillary: 143 mg/dL — ABNORMAL HIGH (ref 70–99)
Glucose-Capillary: 148 mg/dL — ABNORMAL HIGH (ref 70–99)
Glucose-Capillary: 153 mg/dL — ABNORMAL HIGH (ref 70–99)
Glucose-Capillary: 202 mg/dL — ABNORMAL HIGH (ref 70–99)
Glucose-Capillary: 187 mg/dL — ABNORMAL HIGH (ref 70–99)
Glucose-Capillary: 173 mg/dL — ABNORMAL HIGH (ref 70–99)

## 2024-11-13 LAB — APTT: aPTT: 43 s — ABNORMAL HIGH (ref 24–36)

## 2024-11-13 LAB — STREP PNEUMONIAE URINARY ANTIGEN: Strep Pneumo Urinary Antigen: NEGATIVE

## 2024-11-13 LAB — PROTIME-INR
INR: 1.8 — ABNORMAL HIGH (ref 0.8–1.2)
Prothrombin Time: 21.9 s — ABNORMAL HIGH (ref 11.4–15.2)

## 2024-11-13 LAB — PHOSPHORUS
Phosphorus: 4.9 mg/dL — ABNORMAL HIGH (ref 2.5–4.6)
Phosphorus: 5 mg/dL — ABNORMAL HIGH (ref 2.5–4.6)

## 2024-11-13 LAB — LACTIC ACID, PLASMA: Lactic Acid, Venous: 2.1 mmol/L (ref 0.5–1.9)

## 2024-11-13 LAB — AMMONIA: Ammonia: 66 umol/L — ABNORMAL HIGH (ref 9–35)

## 2024-11-13 LAB — VANCOMYCIN, RANDOM: Vancomycin Rm: 18 ug/mL

## 2024-11-13 MED ORDER — THIAMINE MONONITRATE 100 MG PO TABS
100.0000 mg | ORAL_TABLET | Freq: Every day | ORAL | Status: DC
  Administered 2024-11-13: 100 mg
  Filled 2024-11-13: qty 1

## 2024-11-13 MED ORDER — LACTULOSE 10 GM/15ML PO SOLN
30.0000 g | Freq: Three times a day (TID) | ORAL | Status: DC
  Administered 2024-11-13 (×3): 30 g via ORAL
  Filled 2024-11-13 (×3): qty 45

## 2024-11-13 MED ORDER — THIAMINE MONONITRATE 100 MG PO TABS: 100.0000 mg | ORAL_TABLET | Freq: Every day | ORAL | Status: DC

## 2024-11-13 MED ORDER — VITAMIN D 25 MCG (1000 UNIT) PO TABS
1000.0000 [IU] | ORAL_TABLET | Freq: Every day | ORAL | Status: DC
  Administered 2024-11-13 – 2024-11-23 (×10): 1000 [IU]
  Filled 2024-11-13 (×10): qty 1

## 2024-11-13 MED ORDER — PROSOURCE TF20 ENFIT COMPATIBL EN LIQD
60.0000 mL | Freq: Every day | ENTERAL | Status: DC
  Administered 2024-11-13 – 2024-11-23 (×10): 60 mL
  Filled 2024-11-13 (×10): qty 60

## 2024-11-13 MED ORDER — VITAL 1.5 CAL PO LIQD
1000.0000 mL | ORAL | Status: DC
  Administered 2024-11-13 – 2024-11-22 (×8): 1000 mL
  Filled 2024-11-13 (×3): qty 1000

## 2024-11-13 MED ORDER — MIDAZOLAM BOLUS VIA INFUSION
0.0000 mg | INTRAVENOUS | Status: DC | PRN
  Administered 2024-11-13 – 2024-11-14 (×3): 2 mg via INTRAVENOUS

## 2024-11-13 MED ORDER — MIDAZOLAM-SODIUM CHLORIDE 100-0.9 MG/100ML-% IV SOLN
0.0000 mg/h | INTRAVENOUS | Status: DC
  Administered 2024-11-13: 2 mg/h via INTRAVENOUS
  Filled 2024-11-13: qty 100

## 2024-11-13 MED ORDER — INSULIN ASPART 100 UNIT/ML IJ SOLN
0.0000 [IU] | INTRAMUSCULAR | Status: DC
  Administered 2024-11-13 – 2024-11-14 (×3): 2 [IU] via SUBCUTANEOUS
  Administered 2024-11-14: 1 [IU] via SUBCUTANEOUS
  Filled 2024-11-13: qty 1
  Filled 2024-11-13: qty 3
  Filled 2024-11-13 (×2): qty 2

## 2024-11-13 NOTE — Progress Notes (Signed)
 Initial Nutrition Assessment  DOCUMENTATION CODES:  Non-severe (moderate) malnutrition in context of social or environmental circumstances  INTERVENTION:  Initiate tube feeding via OGT: Vital 1.5 at 50 ml/h (1200 ml per day) Start at 20 and advance by 10mL every 8 hours to reach goal Prosource TF20 60 ml 1x/d Provides 1880 kcal, 101 gm protein, 917 ml free water daily Continue MVI, thiamine , folic acid  daily  NUTRITION DIAGNOSIS:  Moderate Malnutrition related to social / environmental circumstances (alcohol  abuse) as evidenced by mild muscle depletion, mild fat depletion.  GOAL:  Patient will meet greater than or equal to 90% of their needs  MONITOR:  TF tolerance, I & O's, Vent status, Labs  REASON FOR ASSESSMENT:  Ventilator, Consult Enteral/tube feeding initiation and management  ASSESSMENT:  Pt with hx of alcohol  abuse, cirrhosis, methadone use for opioid dependence, and hx TBI presented to ED with respiratory failure, AMS, and possible methadone overdose.  11/20 - presented to [facility], intubated 11/25 - extubated 11/27 - reintubated for worsening withdrawal symptoms and transferred to [facility]   Pt resting in bed at the time of assessment, no family present in room at this time. Patient is currently intubated on ventilator support. Sedation being weaned and pt will open eyes when his name is called, but otherwise is not very interactive. Propofol  currently off.   Consult received for initiation of enteral nutrition. OGT in place. Pt with loss of muscle and fat stores throughout exam. Consistent with long term inadequate nutrition, likely in the context of his alcohol  abuse. Will initiate feeds slowly and monitor for signs of refeeding. Unsure if pt received nutrition while he was at [facility].   MV: 9.4 L/min Temp (24hrs), Avg:100.2 F (37.9 C), Min:99.5 F (37.5 C), Max:101 F (38.3 C) MAP (cuff): 75-32mmHg  Propofol : 17.76 ml/hr (469 kcal/d) currently  off  Admit / Current weight: 59.2 kg    Intake/Output Summary (Last 24 hours) at 11/13/2024 1233 Last data filed at 11/13/2024 1000 Gross per 24 hour  Intake 631.01 ml  Output 250 ml  Net 381.01 ml  Net IO Since Admission: 381.01 mL [11/13/24 1233]  Drains/Lines: PICC triple lumen UOP  Nutritionally Relevant Medications: Scheduled Meds:  cholecalciferol  1,000 Units Oral Daily   docusate  100 mg Per Tube BID   famotidine   20 mg Per Tube BID   folic acid   1 mg Per Tube Daily   insulin  aspart  2-6 Units Subcutaneous Q4H   lactulose   20 g Oral TID   methylPREDNISolone   40 mg Intravenous Q12H   multivitamin with minerals  1 tablet Per Tube Daily   thiamine  (VITAMIN B1)  100 mg Intravenous Daily   Continuous Infusions:  azithromycin  Stopped (11/13/24 0014)   fentaNYL  infusion INTRAVENOUS 50 mcg/hr (11/13/24 0600)   piperacillin -tazobactam (ZOSYN )  IV 12.5 mL/hr at 11/13/24 0600   propofol  (DIPRIVAN ) infusion 50 mcg/kg/min (11/13/24 0629)   PRN Meds: polyethylene glycol  Labs Reviewed: BUN 30, creatinine 1.36 Phosphorus 5.0 CBG ranges from 104-143 mg/dL over the last 24 hours HgbA1c 4.2% (11/6)  NUTRITION - FOCUSED PHYSICAL EXAM: Flowsheet Row Most Recent Value  Orbital Region Mild depletion  Upper Arm Region Moderate depletion  Thoracic and Lumbar Region Mild depletion  Buccal Region Mild depletion  Temple Region Mild depletion  Clavicle Bone Region No depletion  Clavicle and Acromion Bone Region Mild depletion  Scapular Bone Region No depletion  Dorsal Hand Unable to assess  Patellar Region Mild depletion  Anterior Thigh  Region Mild depletion  Posterior Calf Region Moderate depletion  Edema (RD Assessment) Moderate  [bilateral forearms, feet]  Hair Reviewed  Eyes Reviewed  Mouth Reviewed  Skin Reviewed  Nails Unable to assess  [mittens]    Diet Order:   Diet Order             Diet NPO time specified Except for: Other (See Comments)  Diet  effective now                   EDUCATION NEEDS:  Not appropriate for education at this time  Skin:  Skin Assessment: Reviewed RN Assessment  Last BM:  unsure  Height:  Ht Readings from Last 1 Encounters:  10/25/24 5' 1 (1.549 m)    Weight:  Wt Readings from Last 1 Encounters:  11/13/24 59.2 kg    Ideal Body Weight:  50.9 kg  BMI:  Body mass index is 24.66 kg/m.  Estimated Nutritional Needs:  Kcal:  1800-2000 kcal/d Protein:  90-105g/d Fluid:  2L/d    Vernell Lukes, RD, LDN, CNSC Registered Dietitian II Please reach out via secure chat

## 2024-11-13 NOTE — Progress Notes (Signed)
 eLink Physician-Brief Progress Note Patient Name: Gregory Murphy DOB: 08/27/1986 MRN: [id number]   Date of Service  11/13/2024  HPI/Events of Note  Patient attempted to pull out ETT tube  eICU Interventions  Bilateral wrist restraints ordered     Intervention Category Minor Interventions: Agitation / anxiety - evaluation and management  Alaric Gladwin Slater Staff 11/13/2024, 10:40 PM

## 2024-11-13 NOTE — Progress Notes (Signed)
 NAME:  Gregory Murphy, MRN:  [id number], DOB:  1986/09/27, LOS: 1 ADMISSION DATE:  11/12/2024, CONSULTATION DATE:  11/27 REFERRING MD:  [facility] Provider, CHIEF COMPLAINT:  ARDS    History of Present Illness:  Patient is a 38 yo M w/ pertinent PMH alcohol  abuse, alcoholic liver cirrhosis, chronic pancytopenia presents to [facility] on 11/27 with ards.   Patient has prior traumatic injury to left femur requiring rod placement from left hip to knee.  Has had prior left knee effusion requiring arthrocentesis back in July 2025.  Patient recently admitted on 11/7-11/14 for left knee effusion requiring arthrocentesis.  While in hospital patient became encephalopathic likely alcohol  withdrawal requiring brief Precedex , Librium  taper, CIWA protocol.   Patient recently started on methadone 3 days prior to this admit.  Patient admitted to [facility] on 11/20 with AMS presumed 2/2 to polysubstance abuse and possible withdrawal. Intbubated for airway protection.  Patient required intubation.  CT chest with b/l peripheral and perivascular groundglass . Ct head no acute abnormality. Patient was extubated on 11/25. While in hospital worsening withdrawal despite ativan  and phenobarb, started on precedex . Developed respiratory failure and reintubated on 11/27.  Patient with increasing O2 requirements and ARDS.    On 11/27 transferred to [facility] for fever, resp failure, possible intentional methadone OD, PNA, AKI, and encephalopathy due to HE and DT.   Pertinent  Medical History   Past Medical History:  Diagnosis Date   Alcohol  withdrawal (HCC) 03/01/2024   Alcoholic cirrhosis of liver with ascites (HCC) 03/02/2024   Alcoholic gastritis 03/03/2024   Alcoholic hepatitis with ascites (HCC) 03/02/2024   Brain injury (HCC)    Cirrhosis with alcoholism (HCC)    Critical polytrauma 12/17/2021   Decompensated cirrhosis (HCC) 03/10/2024   Depression    Elevated AFP 03/03/2024   HCV antibody positive  03/03/2024   Insomnia    Macrocytic anemia 03/03/2024   Malnutrition of moderate degree 12/21/2021   Panic attack    Severe thrombocytopenia 03/03/2024   Splenic rupture 09/07/2023     Significant Hospital Events: Including procedures, antibiotic start and stop dates in addition to other pertinent events   11/27 Admitted to ICU, re intubated, started on Zithromax  and Zosyn    Interim History / Subjective:  Admitted overnight  On exam: Sedated, not responsive to noxious stimuli.   Objective    Blood pressure 110/65, pulse 81, temperature 99.7 F (37.6 C), temperature source Axillary, resp. rate 19, weight 59.2 kg, SpO2 99%.    Vent Mode: PRVC FiO2 (%):  [100 %] 100 % Set Rate:  [18 bmp] 18 bmp Vt Set:  [400 mL] 400 mL PEEP:  [10 cmH20] 10 cmH20 Plateau Pressure:  [20 cmH20-22 cmH20] 20 cmH20   Intake/Output Summary (Last 24 hours) at 11/13/2024 0610 Last data filed at 11/13/2024 0400 Gross per 24 hour  Intake 464.38 ml  Output --  Net 464.38 ml   Filed Weights   11/12/24 2052 11/13/24 0500  Weight: 59.2 kg 59.2 kg    Examination: General: appears ill and older than stated age  HENT: ETT  Lungs: Decrease breath sounds LLL and rhonchi  Cardiovascular: RR Abdomen: soft, ND, +BS  Extremities: +pitting ankle edema bilaterally, warm to toucc=h  Neuro: sedated  GU: foley   Labs:  WBC 10.8 (11.8), Hgb 8.6 (8.9), PLT 37 (56), Phos 4.9  Lactic acid 2.1  Na 140, K 4.3, bicarb 22, BUN 30, Scr 1.36 (1.28) Ammonia 66 (40)  MRSA negative RSV negative  Blood  cultures pending  Trach aspirate pending   CXR 11/27 IMPRESSION: 1. Bilateral airspace disease, right greater than left, concerning for pneumonia. 2. Endotracheal tube and right PICC line are appropriately positioned.  Resolved problem list  Hypokalemia   Assessment and Plan   Neuro Acute metabolic encephalopathy  Acute hepatic encephalopathy  Hyperammoniemia  Ammonia 40 > 66, no BM yet Increased  Lactulose  30 mg TID with goal 3 BM a day   Opioid dependence on Methadone Family at bedside reports that he took only couple of days of methadone + alcohol , unlikely methadone OD.  - can consider starting methadone after extubation   Respiratory Acute respiratory failure requiring intubation 11/20 > then extubated 11/25;  re-intubated 11/27 d/t hypoxemia  ARDS 2/2 to acute alcohol  use and PNA  CXR showed Bilateral airspace disease, blunting of the L CPA.  Initial P/F : 124 > moderate ARDs; P/F 282 > mild phase  -Continue mechanical ventilation: - FiO2 50, PEEP 10, RR 18, TV 400  -Target TVol 6cc/kgIBW -Target Plateau Pressure < 30cm H20 -Target driving pressure less than 15 cm of water -Target PaO2 55-65: titrate PEEP/FiO2 per protocol -VAP > Fent and Prop   - D/c Prop b/c Trig 643 > Start Versed  as needed   -Abx regimen: Zithromax  500 mg daily for 3 days and Zosyn  11/27  -Stop Methylprednisolone    Tobacco Use Disorder with 7 pack year  14 mg Nicotine  patch   Cardiac ECHO pending to r/o cardiac etiology  Maintaining MAP > 65    GI EtOH Cirrhosis Prior Hep C positive 02/2024; RNA Quant negative  History of alcohol  abuse and previous DT  Presumed last drink prior to hospitalization 11/19, was treated with ativan  and phenobarb in [facility]  Presently out of withdrawal window  MELD score 20; 19.6 % 3 month mortality  Sedation > Fent and +/- versed    - wean for RASS 0 to -1 - Thiamine , folic acid , MVI - Pepcid  20 BID   ID Leukocytosis WBC 10.8 < 11.8, afebrile  Blood cultures pending Trach aspirate pending  MRSA negative   Endo > Hyperglycemia  CBG goal 140-180  No SSI needs   Renal AKI, baseline 0.87 Scr 1.36 (1.28)  UOP 250 mL, net + 310  -Monitor Daily I/Os, Daily weight  -Maintain MAP>65 for optimal renal perfusion.  -Avoid nephrotoxic agents such as IV contrast, NSAIDs, and phosphate containing bowel preps (FLEETS)  Lactic acidosis LA 2.1 > 2.1 - Trend    Heme Anemia of chronic disease  Macrocytic anemia 2/2 chronic Etoh use Hgb 8.6, MCV 110 - Monitor  - Transfuse if Hgb < 7   Chronic thrombocytopenia, range ~20-50s  Liver synthetic dysfunction  PLT 37 (56)  Coag panel > PT 21.9/INR 1.8/ aPTT 43  Transfuse with FFP and platelets if active bleeding Avoid blood thinners   MSK/Other Hx of L femur fx requiring L ORIF 12/2021 Hx of R fibula fx requiring R intramedullary nailing 12/2021  History of recurrent hemarthrosis of the L knee  - On exam, no erythema or edema noted.   Nutrition: Dietician  DVT Prophylaxis: SCDs Bowel: Colace BID, PRN Miralax     Labs   CBC: Recent Labs  Lab 11/12/24 2124 11/12/24 2203  WBC 11.8*  --   HGB 8.9* 10.2*  HCT 25.3* 30.0*  MCV 108.1*  --   PLT 56*  --     Basic Metabolic Panel: Recent Labs  Lab 11/12/24 2124 11/12/24 2203  NA 137 142  K 4.1 3.3*  CL 100  --   CO2 24  --   GLUCOSE 95  --   BUN 28*  --   CREATININE 1.28*  --   CALCIUM  7.9*  --   MG 2.0  --    GFR: Estimated Creatinine Clearance: 57.9 mL/min (A) (by C-G formula based on SCr of 1.28 mg/dL (H)). Recent Labs  Lab 11/12/24 2124 11/12/24 2206  WBC 11.8*  --   LATICACIDVEN  --  2.1*    Liver Function Tests: Recent Labs  Lab 11/12/24 2124  AST 99*  ALT 19  ALKPHOS 60  BILITOT 3.3*  PROT 4.7*  ALBUMIN  1.9*   No results for input(s): LIPASE, AMYLASE in the last 168 hours. No results for input(s): AMMONIA in the last 168 hours.  ABG    Component Value Date/Time   PHART 7.428 11/12/2024 2203   PCO2ART 47.2 11/12/2024 2203   PO2ART 124 (H) 11/12/2024 2203   HCO3 30.8 (H) 11/12/2024 2203   TCO2 32 11/12/2024 2203   O2SAT 99 11/12/2024 2203     Coagulation Profile: Recent Labs  Lab 11/13/24 0518  INR 1.8*    Cardiac Enzymes: No results for input(s): CKTOTAL, CKMB, CKMBINDEX, TROPONINI in the last 168 hours.  HbA1C: Hgb A1c MFr Bld  Date/Time Value Ref Range Status   10/22/2024 11:54 PM 4.2 (L) 4.8 - 5.6 % Final    Comment:    (NOTE) Diagnosis of Diabetes The following HbA1c ranges recommended by the American Diabetes Association (ADA) may be used as an aid in the diagnosis of diabetes mellitus.  Hemoglobin             Suggested A1C NGSP%              Diagnosis  <5.7                   Non Diabetic  5.7-6.4                Pre-Diabetic  >6.4                   Diabetic  <7.0                   Glycemic control for                       adults with diabetes.      CBG: Recent Labs  Lab 11/12/24 2052 11/12/24 2305 11/13/24 0306  GLUCAP 105* 104* 137*    Review of Systems:   As above   Past Medical History:  He,  has a past medical history of Alcohol  withdrawal (HCC) (03/01/2024), Alcoholic cirrhosis of liver with ascites (HCC) (03/02/2024), Alcoholic gastritis (03/03/2024), Alcoholic hepatitis with ascites (HCC) (03/02/2024), Brain injury (HCC), Cirrhosis with alcoholism (HCC), Critical polytrauma (12/17/2021), Decompensated cirrhosis (HCC) (03/10/2024), Depression, Elevated AFP (03/03/2024), HCV antibody positive (03/03/2024), Insomnia, Macrocytic anemia (03/03/2024), Malnutrition of moderate degree (12/21/2021), Panic attack, Severe thrombocytopenia (03/03/2024), and Splenic rupture (09/07/2023).   Surgical History:   Past Surgical History:  Procedure Laterality Date   ABDOMINAL SURGERY     CLOSED REDUCTION FINGER WITH PERCUTANEOUS PINNING Left 02/04/2024   Procedure: LEFT THUMB CLOSED REDUCTION FINGER WITH PERCUTANEOUS PINNing;  Surgeon: Arlinda Buster, MD;  Location: [facility];  Service: Orthopedics;  Laterality: Left;   DISTAL FEMUR BONE BRIDGE EXCISION Left    FEMUR CLOSED REDUCTION Left 12/17/2021   Procedure: CLOSED REDUCTION FEMORAL SHAFT;  Surgeon: Barton Drape, MD;  Location: [facility] OR;  Service: Orthopedics;  Laterality: Left;   FOREARM SURGERY Left    x4-metal rods placed   I & D EXTREMITY Left  02/04/2024   Procedure: IRRIGATION AND DEBRIDEMENT OF LEFT THUMB;  Surgeon: Arlinda Buster, MD;  Location: [facility];  Service: Orthopedics;  Laterality: Left;   IR PARACENTESIS  03/02/2024   LEG SURGERY Right 2023   NAILBED REPAIR Left 02/04/2024   Procedure: LEFT THUMB NAILBED REPAIR;  Surgeon: Arlinda Buster, MD;  Location: [facility];  Service: Orthopedics;  Laterality: Left;   ORIF FEMUR FRACTURE Left 12/20/2021   Procedure: OPEN REDUCTION INTERNAL FIXATION (ORIF) DISTAL FEMUR FRACTURE;  Surgeon: Kendal Franky SQUIBB, MD;  Location: [facility] OR;  Service: Orthopedics;  Laterality: Left;   TIBIA IM NAIL INSERTION Bilateral 12/17/2021   Procedure: RIGHT INTRAMEDULLARY (IM) NAIL TIBIAL; RIGHT CLOSED TREATMENT OF FIBULA FRACTURE; LEFT CLOSED REDUCTION SPLINTING OF PERIPROSTHETIC  SUPRACONDYLAR FEMUR FRACTURE;  Surgeon: Barton Drape, MD;  Location: [facility] OR;  Service: Orthopedics;  Laterality: Bilateral;   WRIST ARTHROSCOPY WITH CARPOMETACARPEL [facility]) ARTHROPLASTY Left 03/21/2022   Procedure: left wrist arthroscopy with debridement as needed;  Surgeon: Sissy Cough, MD;  Location: [facility];  Service: Orthopedics;  Laterality: Left;  just needs 60 mins for surgery   WRIST SURGERY Right    pins     Social History:   reports that he has been smoking cigarettes. He has a 7 pack-year smoking history. He has been exposed to tobacco smoke. He has never used smokeless tobacco. He reports current alcohol  use of about 21.0 standard drinks of alcohol  per week. He reports that he does not currently use drugs.   Family History:  His family history is not on file.   Allergies Allergies  Allergen Reactions   Latex Itching    Gloves      Home Medications  Prior to Admission medications   Medication Sig Start Date End Date Taking? Authorizing Provider  chlordiazePOXIDE  (LIBRIUM ) 25 MG capsule 50mg  PO TID x 1D, then 25-50mg  PO BID X 1D, then 25-50mg  PO QD X 1D  10/30/24   Arlon Carliss ORN, DO  folic acid  (FOLVITE ) 1 MG tablet Take 1 tablet (1 mg total) by mouth daily. 10/31/24   Arlon Carliss ORN, DO  metoprolol  succinate (TOPROL -XL) 50 MG 24 hr tablet Take 1 tablet (50 mg total) by mouth daily. Take with or immediately following a meal. 10/31/24   Arlon Carliss ORN, DO  Multiple Vitamins-Minerals (MULTIVITAMIN WITH MINERALS) tablet Take 1 tablet by mouth daily. 10/09/24 12/08/24  Elnor Jayson LABOR, DO  naltrexone  (DEPADE) 50 MG tablet Take 1 tablet (50 mg total) by mouth daily. 10/31/24   Arlon Carliss ORN, DO  nicotine  (NICODERM CQ  - DOSED IN MG/24 HOURS) 21 mg/24hr patch Place 1 patch (21 mg total) onto the skin daily. 10/31/24   Arlon Carliss ORN, DO  thiamine  250 MG TABS Take 500 mg by mouth daily. 10/31/24   Arlon Carliss ORN, DO  traMADol  (ULTRAM ) 50 MG tablet Take 1 tablet (50 mg total) by mouth every 6 (six) hours as needed for severe pain (pain score 7-10) or moderate pain (pain score 4-6). 10/30/24   Arlon Carliss ORN, DO  Vitamin D , Ergocalciferol , (DRISDOL ) 1.25 MG (50000 UNIT) CAPS capsule Take 1 capsule (50,000 Units total) by mouth every 7 (seven) days. 11/01/24   Arlon Carliss ORN, DO     Critical care time:

## 2024-11-13 NOTE — Progress Notes (Signed)
  2D Echocardiogram has been performed.  Gregory Murphy 11/13/2024, 11:28 AM

## 2024-11-13 NOTE — Plan of Care (Signed)

## 2024-11-13 NOTE — Progress Notes (Signed)
 eLink Physician-Brief Progress Note Patient Name: Kentrel Clevenger DOB: Sep 07, 1986 MRN: [id number]   Date of Service  11/13/2024  HPI/Events of Note  CBG 202. SSI not ordered  eICU Interventions  SSI     Intervention Category Minor Interventions: Routine modifications to care plan (e.g. PRN medications for pain, fever)  Uchenna Rappaport Slater Staff 11/13/2024, 8:43 PM

## 2024-11-14 LAB — RENAL FUNCTION PANEL
Albumin: 1.9 g/dL — ABNORMAL LOW (ref 3.5–5.0)
Anion gap: 7 (ref 5–15)
BUN: 43 mg/dL — ABNORMAL HIGH (ref 6–20)
CO2: 29 mmol/L (ref 22–32)
Calcium: 8.3 mg/dL — ABNORMAL LOW (ref 8.9–10.3)
Chloride: 109 mmol/L (ref 98–111)
Creatinine, Ser: 1.4 mg/dL — ABNORMAL HIGH (ref 0.61–1.24)
GFR, Estimated: >60 mL/min (ref >60)
Glucose, Bld: 185 mg/dL — ABNORMAL HIGH (ref 70–99)
Phosphorus: 3.2 mg/dL (ref 2.5–4.6)
Potassium: 3.1 mmol/L — ABNORMAL LOW (ref 3.5–5.1)
Sodium: 145 mmol/L (ref 135–145)

## 2024-11-14 LAB — POCT I-STAT 7, (LYTES, BLD GAS, ICA,H+H)
Acid-Base Excess: 9 mmol/L — ABNORMAL HIGH (ref 0.0–2.0)
Bicarbonate: 34.9 mmol/L — ABNORMAL HIGH (ref 20.0–28.0)
Calcium, Ion: 1.31 mmol/L (ref 1.15–1.40)
HCT: 29 % — ABNORMAL LOW (ref 39.0–52.0)
Hemoglobin: 9.9 g/dL — ABNORMAL LOW (ref 13.0–17.0)
O2 Saturation: 99 %
Patient temperature: 98.5
Potassium: 3.1 mmol/L — ABNORMAL LOW (ref 3.5–5.1)
Sodium: 156 mmol/L — ABNORMAL HIGH (ref 135–145)
TCO2: 36 mmol/L — ABNORMAL HIGH (ref 22–32)
pCO2 arterial: 52.6 mmHg — ABNORMAL HIGH (ref 32–48)
pH, Arterial: 7.429 (ref 7.35–7.45)
pO2, Arterial: 116 mmHg — ABNORMAL HIGH (ref 83–108)

## 2024-11-14 LAB — CBC
HCT: 25.8 % — ABNORMAL LOW (ref 39.0–52.0)
Hemoglobin: 8.3 g/dL — ABNORMAL LOW (ref 13.0–17.0)
MCH: 35.8 pg — ABNORMAL HIGH (ref 26.0–34.0)
MCHC: 32.2 g/dL (ref 30.0–36.0)
MCV: 111.2 fL — ABNORMAL HIGH (ref 80.0–100.0)
Platelets: 43 K/uL — ABNORMAL LOW (ref 150–400)
RBC: 2.32 MIL/uL — ABNORMAL LOW (ref 4.22–5.81)
RDW: 14 % (ref 11.5–15.5)
WBC: 18.6 K/uL — ABNORMAL HIGH (ref 4.0–10.5)
nRBC: 0 % (ref 0.0–0.2)

## 2024-11-14 LAB — MAGNESIUM: Magnesium: 2.5 mg/dL — ABNORMAL HIGH (ref 1.7–2.4)

## 2024-11-14 LAB — GLUCOSE, CAPILLARY
Glucose-Capillary: 173 mg/dL — ABNORMAL HIGH (ref 70–99)
Glucose-Capillary: 145 mg/dL — ABNORMAL HIGH (ref 70–99)
Glucose-Capillary: 203 mg/dL — ABNORMAL HIGH (ref 70–99)
Glucose-Capillary: 133 mg/dL — ABNORMAL HIGH (ref 70–99)
Glucose-Capillary: 137 mg/dL — ABNORMAL HIGH (ref 70–99)

## 2024-11-14 LAB — LACTIC ACID, PLASMA: Lactic Acid, Venous: 1.7 mmol/L (ref 0.5–1.9)

## 2024-11-14 MED ORDER — DEXMEDETOMIDINE HCL IN NACL 400 MCG/100ML IV SOLN
0.0000 ug/kg/h | INTRAVENOUS | Status: DC
  Administered 2024-11-14: 0.4 ug/kg/h via INTRAVENOUS
  Administered 2024-11-14: 0.6 ug/kg/h via INTRAVENOUS
  Administered 2024-11-15: 0.7 ug/kg/h via INTRAVENOUS
  Administered 2024-11-16: 1 ug/kg/h via INTRAVENOUS
  Administered 2024-11-16 (×2): 1.2 ug/kg/h via INTRAVENOUS
  Filled 2024-11-14: qty 200
  Filled 2024-11-14 (×2): qty 100
  Filled 2024-11-14 (×2): qty 200
  Filled 2024-11-14: qty 100

## 2024-11-14 MED ORDER — LORAZEPAM 2 MG/ML IJ SOLN
1.0000 mg | INTRAMUSCULAR | Status: AC | PRN
  Administered 2024-11-14 – 2024-11-15 (×2): 1 mg via INTRAVENOUS
  Filled 2024-11-14 (×2): qty 1

## 2024-11-14 MED ORDER — INSULIN ASPART 100 UNIT/ML IJ SOLN
0.0000 [IU] | INTRAMUSCULAR | Status: DC
  Administered 2024-11-14: 5 [IU] via SUBCUTANEOUS
  Administered 2024-11-14 – 2024-11-18 (×12): 2 [IU] via SUBCUTANEOUS
  Filled 2024-11-14 (×4): qty 2
  Filled 2024-11-14: qty 7
  Filled 2024-11-14: qty 5
  Filled 2024-11-14 (×3): qty 2
  Filled 2024-11-14: qty 3
  Filled 2024-11-14 (×3): qty 2

## 2024-11-14 MED ORDER — LORAZEPAM 2 MG/ML IJ SOLN: 1.0000 mg | Freq: Once | INTRAMUSCULAR | Status: DC

## 2024-11-14 MED ORDER — FUROSEMIDE 10 MG/ML IJ SOLN
40.0000 mg | Freq: Once | INTRAMUSCULAR | Status: AC
  Administered 2024-11-14: 40 mg via INTRAVENOUS
  Filled 2024-11-14: qty 4

## 2024-11-14 MED ORDER — LACTULOSE 10 GM/15ML PO SOLN
20.0000 g | Freq: Three times a day (TID) | ORAL | Status: DC
  Administered 2024-11-14 (×2): 20 g via ORAL
  Filled 2024-11-14 (×2): qty 30

## 2024-11-14 MED ORDER — PHENOBARBITAL SODIUM 65 MG/ML IJ SOLN
32.5000 mg | Freq: Once | INTRAMUSCULAR | Status: AC | PRN
  Administered 2024-11-14: 32.5 mg via INTRAVENOUS
  Filled 2024-11-14: qty 1

## 2024-11-14 MED ORDER — POTASSIUM CHLORIDE 20 MEQ PO PACK
40.0000 meq | PACK | ORAL | Status: AC
  Administered 2024-11-14 (×2): 40 meq
  Filled 2024-11-14 (×2): qty 2

## 2024-11-14 NOTE — Progress Notes (Signed)
   11/14/24 1933  Provider Notification  Provider Name/Title CCM via [facility] RN Jody  Date Provider Notified 11/14/24  Time Provider Notified 1933  Method of Notification Call  Notification Reason Change in status (Patient increasingly agitated after self extubation around1850. Patient on norebreather, able to remove nonrebrether despite restraints. Sats dropping to low 70s. Patient is agiated and banging on bed rails. RT called, RN requesting PRNs for agitation)  Provider response Other (Comment) (Patient is now on Heated High Flow. Verbal orders for ABG to assess which PRNs would be apprpropriate. In the meantime, titrate precedex  up)  Date of Provider Response 11/14/24  Time of Provider Response [phone]

## 2024-11-14 NOTE — Plan of Care (Signed)
 Patient febrile. Tylenol  given, Ice packs applied. Agitated at beginning of shift. Following commands but attempting to pull tube whenever RN leaves room. Elink notified, restraints placed. Patient tolerating.  Bowel regimen given as ordered. Multiple liquid BM's. Rectal pouch placed.  Problem: Elimination: Goal: Will not experience complications related to bowel motility Outcome: Progressing   Problem: Clinical Measurements: Goal: Will remain free from infection Outcome: Not Progressing

## 2024-11-14 NOTE — Progress Notes (Addendum)
 eLink Physician-Brief Progress Note Patient Name: Gregory Murphy DOB: 08/19/1986 MRN: [id number]   Date of Service  11/14/2024  HPI/Events of Note  self extubated at change of shift, currently on NRB, very agitated, increasing precedex  but asking for prn's   has PICC, desats easy  Camera evaluation done. RT at bed side also. On Hiflo, sats and Vitals stable. Able to protect his airways. HR 76. Has type 2 resp failure.    eICU Interventions  Get ABG first. If co2 increasing, or menta status down, low thresh hold to re intubate. Or avoiding ativan  for alcohol  withdrawal. Was on fentanyl  during intubation.  Ok to titrate up precedex  as tolerated, now at 0.5 Aspiration and seizure precautions     Intervention Category Intermediate Interventions: Respiratory distress - evaluation and management Minor Interventions: Agitation / anxiety - evaluation and management  [name] 11/14/2024, 7:52 PM  20:36 ABG result in chart. Co2 at 52, compensated respiratory acidosis.   Request for something for sedation. only has fent bolus for bag. And on 0.6 of precedex  - increasing precedex  to 0.7 , HR 55. Can not go up.  phenobarbital  trial for alcohol  withdrawal. - low thresh hold to re intubate by groud team if worsening.    2200 Camera alert: Trying to get out of bed. RN asking for psey belt. Just got phenobarbital , giving it intravenously now.   22:29 Camera high alert  Kicking, yelling, needed 6 people to hold him, phenobarbital  did not touch him. Stat ativan  1 mg q10 mins x 2 ordered, follow VBG at 1115 PM. Low thresh hold to re intubate.   04:57. Camera: Since last visit, had 3 more ativan  ( total 4 mg) as CIWA protocol per Lauren PA. VS stable. He is encephalopathic. Last few pco2 stable around 53. -  RT just got ABG. If pco2 rising will get ground team for possible intubation.   05:18 Type 2 resp failure improving. Call back if GCS getting lower not able to  protect airways.

## 2024-11-14 NOTE — Progress Notes (Signed)
 NAME:  Gregory Murphy, MRN:  [id number], DOB:  1986/07/22, LOS: 2 ADMISSION DATE:  11/12/2024, CONSULTATION DATE:  11/27 REFERRING MD:  [facility] Provider, CHIEF COMPLAINT:  ARDS    History of Present Illness:  Patient is a 38 yo M w/ pertinent PMH alcohol  abuse, alcoholic liver cirrhosis, chronic pancytopenia presents to [facility] on 11/27 with ards.   Patient has prior traumatic injury to left femur requiring rod placement from left hip to knee.  Has had prior left knee effusion requiring arthrocentesis back in July 2025.  Patient recently admitted on 11/7-11/14 for left knee effusion requiring arthrocentesis.  While in hospital patient became encephalopathic likely alcohol  withdrawal requiring brief Precedex , Librium  taper, CIWA protocol.   Patient recently started on methadone 3 days prior to this admit.  Patient admitted to [facility] on 11/20 with AMS presumed 2/2 to polysubstance abuse and possible withdrawal. Intbubated for airway protection.  Patient required intubation.  CT chest with b/l peripheral and perivascular groundglass . Ct head no acute abnormality. Patient was extubated on 11/25. While in hospital worsening withdrawal despite ativan  and phenobarb, started on precedex . Developed respiratory failure and reintubated on 11/27.  Patient with increasing O2 requirements and ARDS.    On 11/27 transferred to [facility] for fever, resp failure, possible intentional methadone OD, PNA, AKI, and encephalopathy due to HE and DT.   Pertinent  Medical History   Past Medical History:  Diagnosis Date   Alcohol  withdrawal (HCC) 03/01/2024   Alcoholic cirrhosis of liver with ascites (HCC) 03/02/2024   Alcoholic gastritis 03/03/2024   Alcoholic hepatitis with ascites (HCC) 03/02/2024   Brain injury (HCC)    Cirrhosis with alcoholism (HCC)    Critical polytrauma 12/17/2021   Decompensated cirrhosis (HCC) 03/10/2024   Depression    Elevated AFP 03/03/2024   HCV antibody positive  03/03/2024   Insomnia    Macrocytic anemia 03/03/2024   Malnutrition of moderate degree 12/21/2021   Panic attack    Severe thrombocytopenia 03/03/2024   Splenic rupture 09/07/2023     Significant Hospital Events: Including procedures, antibiotic start and stop dates in addition to other pertinent events   11/27 Admitted to ICU, re intubated, started on Zithromax  and Zosyn   11/28 Stop stress dose steroids, decrease RR > 18   Interim History / Subjective:  O/N: pulling out ETT tube > bilateral wrist restraints. Fever 101.7   On exam: Awakens, alert, following commands.  Able to lift his head off the bed, wiggle his toes and squeeze my fingers.  Able to track.  Objective    Blood pressure 128/76, pulse (!) 49, temperature 97.9 F (36.6 C), temperature source Oral, resp. rate 20, weight 59.6 kg, SpO2 99%.    Vent Mode: PRVC FiO2 (%):  [50 %] 50 % Set Rate:  [18 bmp] 18 bmp Vt Set:  [400 mL] 400 mL PEEP:  [10 cmH20] 10 cmH20 Plateau Pressure:  [20 cmH20-26 cmH20] 20 cmH20   Intake/Output Summary (Last 24 hours) at 11/14/2024 9077 Last data filed at 11/14/2024 0900 Gross per 24 hour  Intake 1139.61 ml  Output 1300 ml  Net -160.39 ml   Filed Weights   11/12/24 2052 11/13/24 0500 11/14/24 0342  Weight: 59.2 kg 59.2 kg 59.6 kg    Examination: General: appears ill and older than stated age  HENT: ETT  Lungs: + Rhonchi Cardiovascular: RR Abdomen: soft, ND, +BS  Extremities: + Bilateral ankle edema, warm to touch Neuro: Awakens, alert, following commands. GU: foley  Labs: Na 145, K 3.1 (4.2), Scr 1.40 (1.36) WBC 18.6 (10.8), hgb 8.3 (8.6), MCV 111.2, Mag 2.5  PLT 43 (37)  Lactic acid 1.7 (2.1)   Blood cultures NG x 2  Trach aspirate = No organisms    Resolved problem list  Lactic acidosis - resolved   Assessment and Plan   Neuro Acute metabolic encephalopathy  Acute hepatic encephalopathy  Hyperammoniemia  Ammonia 40 > 66 Multiple Bowel movements >  decrease Lactulose  20 TID  Mentation improving today, awake and alert, following commands   Opioid dependence on Methadone Family at bedside reports that he took only couple of days of methadone + alcohol , unlikely methadone OD.  - can consider starting methadone after extubation   Respiratory Acute respiratory failure requiring intubation 11/20 > then extubated 11/25;  re-intubated 11/27 d/t hypoxemia  ARDS 2/2 to chronic alcohol  use and PNA   Initial P/F : 124 > moderate ARDs; P/F 282 > mild ARDS -Continue mechanical ventilation with plans for SBT today  -Target TVol 6cc/kgIBW -Target Plateau Pressure < 30cm H20 -Target driving pressure less than 15 cm of water -Target PaO2 55-65: titrate PEEP/FiO2 per protocol -VAP > decrease Fent, transition from versed  to precedex  for SBT   - wean down to maintain RASS 0-1  -Abx regimen: Zithromax  500 mg daily for 3 days and Zosyn  11/27  -IV Lasix  40 mg today  -Stop Methylprednisolone  11/28   Tobacco Use Disorder with 7 pack year  14 mg Nicotine  patch   Cardiac ECHO showed EF 55-60%, normal LV function, no RWMA.  Maintaining MAP > 65    GI EtOH Cirrhosis Prior Hep C positive 02/2024; RNA Quant negative  History of alcohol  abuse and previous DT  Presumed last drink prior to hospitalization 11/19, was treated with ativan  and phenobarb in [facility]  Presently out of withdrawal window  MELD score 20; 19.6 % 3 month mortality  Sedation > wean off fent, precedex  as needed. Stop versed     - wean for RASS 0 to -1 - Thiamine , folic acid , MVI - Pepcid  20 BID   ID Leukocytosis Fevers  WBC 18.6 (10.8), fevers. Stopped steroid yesterday.   Blood cultures NG and trach aspirate w/o organisms   - Continue current ABX   Endo > Hyperglycemia  CBG goal 140-180  SSI moderate + TF   Renal AKI, baseline 0.87 Scr 1.40 (1.36)  UOP 875 mL, net -290 - Lasix  40 today  -Monitor Daily I/Os, Daily weight  -Maintain MAP>65 for optimal renal  perfusion.  -Avoid nephrotoxic agents such as IV contrast, NSAIDs, and phosphate containing bowel preps (FLEETS)  Hypokalemia  K 3.1 > replete   Heme Anemia of chronic disease  Macrocytic anemia 2/2 chronic Etoh use Hgb 8.3, MCV 111 - Monitor  - Transfuse if Hgb < 7   Chronic thrombocytopenia, range ~20-50s  Liver synthetic dysfunction  PLT 43 Coag panel > PT 21.9/INR 1.8/ aPTT 43  Transfuse with FFP and platelets if active bleeding Avoid blood thinners   MSK/Other Hx of L femur fx requiring L ORIF 12/2021 Hx of R fibula fx requiring R intramedullary nailing 12/2021  History of recurrent hemarthrosis of the L knee  - On exam, no erythema or edema noted.   Nutrition: TF   DVT Prophylaxis: SCDs Bowel: Colace BID, PRN Miralax     Labs   CBC: Recent Labs  Lab 11/12/24 2124 11/12/24 2203 11/13/24 0518 11/13/24 0817 11/14/24 0337  WBC 11.8*  --  10.8*  --  18.6*  HGB 8.9* 10.2* 8.6* 8.8* 8.3*  HCT 25.3* 30.0* 25.3* 26.0* 25.8*  MCV 108.1*  --  110.0*  --  111.2*  PLT 56*  --  37*  --  43*    Basic Metabolic Panel: Recent Labs  Lab 11/12/24 2124 11/12/24 2203 11/13/24 0518 11/13/24 0817 11/14/24 0337  NA 137 142 140 143 145  K 4.1 3.3* 4.3 4.2 3.1*  CL 100  --  104  --  109  CO2 24  --  22  --  29  GLUCOSE 95  --  134*  --  185*  BUN 28*  --  30*  --  43*  CREATININE 1.28*  --  1.36*  --  1.40*  CALCIUM  7.9*  --  7.7*  --  8.3*  MG 2.0  --   --   --  2.5*  PHOS  --   --  5.0*  4.9*  --  3.2   GFR: Estimated Creatinine Clearance: 52.9 mL/min (A) (by C-G formula based on SCr of 1.4 mg/dL (H)). Recent Labs  Lab 11/12/24 2124 11/12/24 2206 11/13/24 0518 11/13/24 1002 11/14/24 0337  WBC 11.8*  --  10.8*  --  18.6*  LATICACIDVEN  --  2.1*  --  2.1* 1.7    Liver Function Tests: Recent Labs  Lab 11/12/24 2124 11/14/24 0337  AST 99*  --   ALT 19  --   ALKPHOS 60  --   BILITOT 3.3*  --   PROT 4.7*  --   ALBUMIN  1.9* 1.9*   No results for  input(s): LIPASE, AMYLASE in the last 168 hours. Recent Labs  Lab 11/13/24 0518  AMMONIA 66*    ABG    Component Value Date/Time   PHART 7.351 11/13/2024 0817   PCO2ART 59.3 (H) 11/13/2024 0817   PO2ART 282 (H) 11/13/2024 0817   HCO3 32.7 (H) 11/13/2024 0817   TCO2 34 (H) 11/13/2024 0817   O2SAT 100 11/13/2024 0817     Coagulation Profile: Recent Labs  Lab 11/13/24 0518  INR 1.8*    Cardiac Enzymes: No results for input(s): CKTOTAL, CKMB, CKMBINDEX, TROPONINI in the last 168 hours.  HbA1C: Hgb A1c MFr Bld  Date/Time Value Ref Range Status  10/22/2024 11:54 PM 4.2 (L) 4.8 - 5.6 % Final    Comment:    (NOTE) Diagnosis of Diabetes The following HbA1c ranges recommended by the American Diabetes Association (ADA) may be used as an aid in the diagnosis of diabetes mellitus.  Hemoglobin             Suggested A1C NGSP%              Diagnosis  <5.7                   Non Diabetic  5.7-6.4                Pre-Diabetic  >6.4                   Diabetic  <7.0                   Glycemic control for                       adults with diabetes.      CBG: Recent Labs  Lab 11/13/24 1911 11/13/24 2108 11/13/24 2306 11/14/24 0308 11/14/24 0715  GLUCAP 202* 187* 173* 173* 145*  Review of Systems:   As above   Past Medical History:  He,  has a past medical history of Alcohol  withdrawal (HCC) (03/01/2024), Alcoholic cirrhosis of liver with ascites (HCC) (03/02/2024), Alcoholic gastritis (03/03/2024), Alcoholic hepatitis with ascites (HCC) (03/02/2024), Brain injury (HCC), Cirrhosis with alcoholism (HCC), Critical polytrauma (12/17/2021), Decompensated cirrhosis (HCC) (03/10/2024), Depression, Elevated AFP (03/03/2024), HCV antibody positive (03/03/2024), Insomnia, Macrocytic anemia (03/03/2024), Malnutrition of moderate degree (12/21/2021), Panic attack, Severe thrombocytopenia (03/03/2024), and Splenic rupture (09/07/2023).   Surgical History:   Past  Surgical History:  Procedure Laterality Date   ABDOMINAL SURGERY     CLOSED REDUCTION FINGER WITH PERCUTANEOUS PINNING Left 02/04/2024   Procedure: LEFT THUMB CLOSED REDUCTION FINGER WITH PERCUTANEOUS PINNing;  Surgeon: Arlinda Buster, MD;  Location: [facility];  Service: Orthopedics;  Laterality: Left;   DISTAL FEMUR BONE BRIDGE EXCISION Left    FEMUR CLOSED REDUCTION Left 12/17/2021   Procedure: CLOSED REDUCTION FEMORAL SHAFT;  Surgeon: Barton Drape, MD;  Location: [facility];  Service: Orthopedics;  Laterality: Left;   FOREARM SURGERY Left    x4-metal rods placed   I & D EXTREMITY Left 02/04/2024   Procedure: IRRIGATION AND DEBRIDEMENT OF LEFT THUMB;  Surgeon: Arlinda Buster, MD;  Location: [facility];  Service: Orthopedics;  Laterality: Left;   IR PARACENTESIS  03/02/2024   LEG SURGERY Right 2023   NAILBED REPAIR Left 02/04/2024   Procedure: LEFT THUMB NAILBED REPAIR;  Surgeon: Arlinda Buster, MD;  Location: [facility];  Service: Orthopedics;  Laterality: Left;   ORIF FEMUR FRACTURE Left 12/20/2021   Procedure: OPEN REDUCTION INTERNAL FIXATION (ORIF) DISTAL FEMUR FRACTURE;  Surgeon: Kendal Franky SQUIBB, MD;  Location: [facility];  Service: Orthopedics;  Laterality: Left;   TIBIA IM NAIL INSERTION Bilateral 12/17/2021   Procedure: RIGHT INTRAMEDULLARY (IM) NAIL TIBIAL; RIGHT CLOSED TREATMENT OF FIBULA FRACTURE; LEFT CLOSED REDUCTION SPLINTING OF PERIPROSTHETIC  SUPRACONDYLAR FEMUR FRACTURE;  Surgeon: Barton Drape, MD;  Location: [facility];  Service: Orthopedics;  Laterality: Bilateral;   WRIST ARTHROSCOPY WITH CARPOMETACARPEL [facility]) ARTHROPLASTY Left 03/21/2022   Procedure: left wrist arthroscopy with debridement as needed;  Surgeon: Sissy Cough, MD;  Location: [facility];  Service: Orthopedics;  Laterality: Left;  just needs 60 mins for surgery   WRIST SURGERY Right    pins     Social History:   reports that he has been smoking  cigarettes. He has a 7 pack-year smoking history. He has been exposed to tobacco smoke. He has never used smokeless tobacco. He reports current alcohol  use of about 21.0 standard drinks of alcohol  per week. He reports that he does not currently use drugs.   Family History:  His family history is not on file.   Allergies Allergies  Allergen Reactions   Latex Itching    Gloves      Home Medications  Prior to Admission medications   Medication Sig Start Date End Date Taking? Authorizing Provider  chlordiazePOXIDE  (LIBRIUM ) 25 MG capsule 50mg  PO TID x 1D, then 25-50mg  PO BID X 1D, then 25-50mg  PO QD X 1D 10/30/24   Arlon Carliss ORN, DO  folic acid  (FOLVITE ) 1 MG tablet Take 1 tablet (1 mg total) by mouth daily. 10/31/24   Arlon Carliss ORN, DO  metoprolol  succinate (TOPROL -XL) 50 MG 24 hr tablet Take 1 tablet (50 mg total) by mouth daily. Take with or immediately following a meal. 10/31/24   Arlon Carliss ORN, DO  Multiple Vitamins-Minerals (MULTIVITAMIN WITH MINERALS) tablet  Take 1 tablet by mouth daily. 10/09/24 12/08/24  Elnor Jayson LABOR, DO  naltrexone  (DEPADE) 50 MG tablet Take 1 tablet (50 mg total) by mouth daily. 10/31/24   Arlon Carliss ORN, DO  nicotine  (NICODERM CQ  - DOSED IN MG/24 HOURS) 21 mg/24hr patch Place 1 patch (21 mg total) onto the skin daily. 10/31/24   Arlon Carliss ORN, DO  thiamine  250 MG TABS Take 500 mg by mouth daily. 10/31/24   Arlon Carliss ORN, DO  traMADol  (ULTRAM ) 50 MG tablet Take 1 tablet (50 mg total) by mouth every 6 (six) hours as needed for severe pain (pain score 7-10) or moderate pain (pain score 4-6). 10/30/24   Arlon Carliss ORN, DO  Vitamin D , Ergocalciferol , (DRISDOL ) 1.25 MG (50000 UNIT) CAPS capsule Take 1 capsule (50,000 Units total) by mouth every 7 (seven) days. 11/01/24   Arlon Carliss ORN, DO     Critical care time:

## 2024-11-14 NOTE — Progress Notes (Signed)
 Patient self extubated this evening. Placed on non-rebreather, currently awake, alert calm. Plan had been to extubate tomorrow anyway. Will keep on face mask vs HHFNC as needed. May benefit from additional lasix . May need precedex  overnight.   Additonal cc time 25 minutes  Shekelia Boutin, MD Pulmonary and Critical Care Medicine [facility] 11/14/2024 6:57 PM Pager: see AMION  If no response to pager, please call critical care on call (see AMION) until 7pm After 7:00 pm call Elink

## 2024-11-14 NOTE — Plan of Care (Signed)
  Problem: Clinical Measurements: Goal: Ability to maintain clinical measurements within normal limits will improve Outcome: Progressing   Problem: Nutrition: Goal: Adequate nutrition will be maintained Outcome: Progressing   Problem: Pain Managment: Goal: General experience of comfort will improve and/or be controlled Outcome: Progressing   Problem: Safety: Goal: Ability to remain free from injury will improve Outcome: Progressing

## 2024-11-14 NOTE — Progress Notes (Signed)
 [facility] ADULT ICU REPLACEMENT PROTOCOL   The patient does apply for the [facility] Adult ICU Electrolyte Replacment Protocol based on the criteria listed below:   1.Exclusion criteria: TCTS, ECMO, Dialysis, and Myasthenia Gravis patients 2. Is GFR >/= 30 ml/min? Yes.    Patient's GFR today is >60 3. Is SCr </= 2? Yes.   Patient's SCr is 1.40 mg/dL 4. Did SCr increase >/= 0.5 in 24 hours? No. 5.Pt's weight >40kg  Yes.   6. Abnormal electrolyte(s): K+ 3.1  7. Electrolytes replaced per protocol 8.  Call MD STAT for K+ </= 2.5, Phos </= 1, or Mag </= 1 Physician:  Kassie Ole BIRCH [facility] 11/14/2024 4:48 AM

## 2024-11-15 LAB — RENAL FUNCTION PANEL
Albumin: 2.1 g/dL — ABNORMAL LOW (ref 3.5–5.0)
Anion gap: 10 (ref 5–15)
BUN: 40 mg/dL — ABNORMAL HIGH (ref 6–20)
CO2: 29 mmol/L (ref 22–32)
Calcium: 8.9 mg/dL (ref 8.9–10.3)
Chloride: 113 mmol/L — ABNORMAL HIGH (ref 98–111)
Creatinine, Ser: 1.13 mg/dL (ref 0.61–1.24)
GFR, Estimated: >60 mL/min (ref >60)
Glucose, Bld: 109 mg/dL — ABNORMAL HIGH (ref 70–99)
Phosphorus: 2 mg/dL — ABNORMAL LOW (ref 2.5–4.6)
Potassium: 3.2 mmol/L — ABNORMAL LOW (ref 3.5–5.1)
Sodium: 152 mmol/L — ABNORMAL HIGH (ref 135–145)

## 2024-11-15 LAB — POCT I-STAT 7, (LYTES, BLD GAS, ICA,H+H)
Acid-Base Excess: 9 mmol/L — ABNORMAL HIGH (ref 0.0–2.0)
Acid-Base Excess: 12 mmol/L — ABNORMAL HIGH (ref 0.0–2.0)
Bicarbonate: 34.3 mmol/L — ABNORMAL HIGH (ref 20.0–28.0)
Bicarbonate: 35.2 mmol/L — ABNORMAL HIGH (ref 20.0–28.0)
Calcium, Ion: 1.31 mmol/L (ref 1.15–1.40)
Calcium, Ion: 1.27 mmol/L (ref 1.15–1.40)
HCT: 30 % — ABNORMAL LOW (ref 39.0–52.0)
HCT: 26 % — ABNORMAL LOW (ref 39.0–52.0)
Hemoglobin: 10.2 g/dL — ABNORMAL LOW (ref 13.0–17.0)
Hemoglobin: 8.8 g/dL — ABNORMAL LOW (ref 13.0–17.0)
O2 Saturation: 92 %
O2 Saturation: 86 %
Patient temperature: 98.5
Patient temperature: 100.7
Potassium: 3.3 mmol/L — ABNORMAL LOW (ref 3.5–5.1)
Potassium: 3.4 mmol/L — ABNORMAL LOW (ref 3.5–5.1)
Sodium: 157 mmol/L — ABNORMAL HIGH (ref 135–145)
Sodium: 155 mmol/L — ABNORMAL HIGH (ref 135–145)
TCO2: 36 mmol/L — ABNORMAL HIGH (ref 22–32)
TCO2: 36 mmol/L — ABNORMAL HIGH (ref 22–32)
pCO2 arterial: 49.6 mmHg — ABNORMAL HIGH (ref 32–48)
pCO2 arterial: 43.9 mmHg (ref 32–48)
pH, Arterial: 7.447 (ref 7.35–7.45)
pH, Arterial: 7.516 — ABNORMAL HIGH (ref 7.35–7.45)
pO2, Arterial: 63 mmHg — ABNORMAL LOW (ref 83–108)
pO2, Arterial: 50 mmHg — ABNORMAL LOW (ref 83–108)

## 2024-11-15 LAB — GLUCOSE, CAPILLARY
Glucose-Capillary: 107 mg/dL — ABNORMAL HIGH (ref 70–99)
Glucose-Capillary: 112 mg/dL — ABNORMAL HIGH (ref 70–99)
Glucose-Capillary: 120 mg/dL — ABNORMAL HIGH (ref 70–99)
Glucose-Capillary: 122 mg/dL — ABNORMAL HIGH (ref 70–99)
Glucose-Capillary: 127 mg/dL — ABNORMAL HIGH (ref 70–99)
Glucose-Capillary: 131 mg/dL — ABNORMAL HIGH (ref 70–99)
Glucose-Capillary: 95 mg/dL (ref 70–99)

## 2024-11-15 LAB — BLOOD GAS, ARTERIAL
Acid-Base Excess: 9.4 mmol/L — ABNORMAL HIGH (ref 0.0–2.0)
Bicarbonate: 35.3 mmol/L — ABNORMAL HIGH (ref 20.0–28.0)
O2 Saturation: 91.1 %
Patient temperature: 36.9
pCO2 arterial: 52 mmHg — ABNORMAL HIGH (ref 32–48)
pH, Arterial: 7.44 (ref 7.35–7.45)
pO2, Arterial: 58 mmHg — ABNORMAL LOW (ref 83–108)

## 2024-11-15 LAB — LEGIONELLA PNEUMOPHILA SEROGP 1 UR AG: L. pneumophila Serogp 1 Ur Ag: NEGATIVE

## 2024-11-15 LAB — CBC
HCT: 30 % — ABNORMAL LOW (ref 39.0–52.0)
Hemoglobin: 9.4 g/dL — ABNORMAL LOW (ref 13.0–17.0)
MCH: 35.5 pg — ABNORMAL HIGH (ref 26.0–34.0)
MCHC: 31.3 g/dL (ref 30.0–36.0)
MCV: 113.2 fL — ABNORMAL HIGH (ref 80.0–100.0)
Platelets: 37 K/uL — ABNORMAL LOW (ref 150–400)
RBC: 2.65 MIL/uL — ABNORMAL LOW (ref 4.22–5.81)
RDW: 13.9 % (ref 11.5–15.5)
WBC: 14.3 K/uL — ABNORMAL HIGH (ref 4.0–10.5)
nRBC: 0 % (ref 0.0–0.2)

## 2024-11-15 LAB — CULTURE, RESPIRATORY W GRAM STAIN: Culture: NORMAL

## 2024-11-15 MED ORDER — FOLIC ACID 5 MG/ML IJ SOLN
1.0000 mg | Freq: Every day | INTRAMUSCULAR | Status: DC
  Administered 2024-11-15 – 2024-11-17 (×3): 1 mg via INTRAVENOUS
  Filled 2024-11-15 (×3): qty 0.2

## 2024-11-15 MED ORDER — FAMOTIDINE IN NACL 20-0.9 MG/50ML-% IV SOLN
20.0000 mg | Freq: Two times a day (BID) | INTRAVENOUS | Status: DC
  Administered 2024-11-15 – 2024-11-17 (×5): 20 mg via INTRAVENOUS
  Filled 2024-11-15 (×5): qty 50

## 2024-11-15 MED ORDER — FUROSEMIDE 10 MG/ML IJ SOLN
40.0000 mg | Freq: Once | INTRAMUSCULAR | Status: AC
  Administered 2024-11-15: 40 mg via INTRAVENOUS
  Filled 2024-11-15: qty 4

## 2024-11-15 MED ORDER — LACTULOSE 10 GM/15ML PO SOLN
20.0000 g | Freq: Three times a day (TID) | ORAL | Status: DC
  Administered 2024-11-16 – 2024-11-18 (×7): 20 g
  Filled 2024-11-15 (×7): qty 30

## 2024-11-15 MED ORDER — FUROSEMIDE 10 MG/ML IJ SOLN: 40.0000 mg | Freq: Two times a day (BID) | INTRAMUSCULAR | Status: DC

## 2024-11-15 MED ORDER — DEXTROSE 5 % IV SOLN: INTRAVENOUS | Status: DC

## 2024-11-15 MED ORDER — POTASSIUM PHOSPHATES 15 MMOLE/5ML IV SOLN
15.0000 mmol | Freq: Once | INTRAVENOUS | Status: AC
  Administered 2024-11-15: 15 mmol via INTRAVENOUS
  Filled 2024-11-15: qty 5

## 2024-11-15 MED ORDER — THIAMINE HCL 100 MG/ML IJ SOLN
100.0000 mg | Freq: Every day | INTRAMUSCULAR | Status: DC
  Administered 2024-11-15 – 2024-11-17 (×3): 100 mg via INTRAVENOUS
  Filled 2024-11-15 (×3): qty 2

## 2024-11-15 MED ORDER — POTASSIUM CHLORIDE 10 MEQ/50ML IV SOLN
10.0000 meq | INTRAVENOUS | Status: AC
  Administered 2024-11-15 (×4): 10 meq via INTRAVENOUS
  Filled 2024-11-15 (×2): qty 50

## 2024-11-15 MED ORDER — LORAZEPAM 2 MG/ML IJ SOLN
1.0000 mg | INTRAMUSCULAR | Status: DC | PRN
  Administered 2024-11-15 – 2024-11-19 (×8): 2 mg via INTRAVENOUS
  Filled 2024-11-15 (×10): qty 1

## 2024-11-15 MED ORDER — ORAL CARE MOUTH RINSE: 15.0000 mL | OROMUCOSAL | Status: DC | PRN

## 2024-11-15 NOTE — Plan of Care (Signed)

## 2024-11-15 NOTE — Progress Notes (Signed)
 NAME:  Gregory Murphy, MRN:  [id number], DOB:  1986/01/14, LOS: 3 ADMISSION DATE:  11/12/2024, CONSULTATION DATE:  11/27 REFERRING MD:  [facility] Provider, CHIEF COMPLAINT:  ARDS    History of Present Illness:  Patient is a 38 yo M w/ pertinent PMH alcohol  abuse, alcoholic liver cirrhosis, chronic pancytopenia presents to [facility] on 11/27 with ards.   Patient has prior traumatic injury to left femur requiring rod placement from left hip to knee.  Has had prior left knee effusion requiring arthrocentesis back in July 2025.  Patient recently admitted on 11/7-11/14 for left knee effusion requiring arthrocentesis.  While in hospital patient became encephalopathic likely alcohol  withdrawal requiring brief Precedex , Librium  taper, CIWA protocol.   Patient recently started on methadone 3 days prior to this admit.  Patient admitted to [facility] on 11/20 with AMS presumed 2/2 to polysubstance abuse and possible withdrawal. Intbubated for airway protection.  Patient required intubation.  CT chest with b/l peripheral and perivascular groundglass . Ct head no acute abnormality. Patient was extubated on 11/25. While in hospital worsening withdrawal despite ativan  and phenobarb, started on precedex . Developed respiratory failure and reintubated on 11/27.  Patient with increasing O2 requirements and ARDS.    On 11/27 transferred to [facility] for fever, resp failure, possible intentional methadone OD, PNA, AKI, and encephalopathy due to HE and DT.   Pertinent  Medical History   Past Medical History:  Diagnosis Date   Alcohol  withdrawal (HCC) 03/01/2024   Alcoholic cirrhosis of liver with ascites (HCC) 03/02/2024   Alcoholic gastritis 03/03/2024   Alcoholic hepatitis with ascites (HCC) 03/02/2024   Brain injury (HCC)    Cirrhosis with alcoholism (HCC)    Critical polytrauma 12/17/2021   Decompensated cirrhosis (HCC) 03/10/2024   Depression    Elevated AFP 03/03/2024   HCV antibody positive  03/03/2024   Insomnia    Macrocytic anemia 03/03/2024   Malnutrition of moderate degree 12/21/2021   Panic attack    Severe thrombocytopenia 03/03/2024   Splenic rupture 09/07/2023     Significant Hospital Events: Including procedures, antibiotic start and stop dates in addition to other pertinent events   11/27 Admitted to ICU, re intubated, started on Zithromax  and Zosyn   11/28 Stop stress dose steroids, decrease RR > 18  11/29 self extubated  Interim History / Subjective:  Self extubated last night. Now on HHFNC, pretty lethargic. Diuresed well  Objective    Blood pressure (!) 141/75, pulse (!) 58, temperature 98.6 F (37 C), temperature source Axillary, resp. rate (!) 24, weight 54.1 kg, SpO2 98%.    Vent Mode: PRVC FiO2 (%):  [40 %-80 %] 77 % Set Rate:  [18 bmp] 18 bmp Vt Set:  [400 mL] 400 mL PEEP:  [5 cmH20] 5 cmH20 Plateau Pressure:  [21 cmH20] 21 cmH20   Intake/Output Summary (Last 24 hours) at 11/15/2024 1152 Last data filed at 11/15/2024 1000 Gross per 24 hour  Intake 1362.71 ml  Output 4150 ml  Net -2787.29 ml   Filed Weights   11/13/24 0500 11/14/24 0342 11/15/24 0500  Weight: 59.2 kg 59.6 kg 54.1 kg    Examination: Acutely and chronically appearing man Appears older than stated age On HHFNC, tachypnic, no wheeze Lethargic but arouses to name and touch RRR Mild upper extremity edema  Labs: ABG shows metabolic alkalosis, hypoxemia Na 155 K 3.4 Hgb 8.8  Resolved problem list  Lactic acidosis - resolved  AKI  Assessment and Plan   Neuro Acute metabolic encephalopathy  Acute hepatic encephalopathy  Hyperammoniemia  Opioid dependence on Methadone - continue lactulose , may need to hold today since no enteral access - suspect we are out of the etoh withdrawal windwo now and mostly dealing with delirium - reviewed non pharmacologic delirium precautions with nursing and   Respiratory Acute respiratory failure requiring intubation 11/20 > then  extubated 11/25;  re-intubated 11/27 d/t hypoxemia  ARDS 2/2 to chronic alcohol  use and PNA  - self extubated 11/29 - continue HHFNC, titrate down as tolerated - complete azithromycin , piperacillin -tazobactam -IV Lasix  40 mg today  - remains high risk for reintubation  Tobacco Use Disorder with 7 pack year  14 mg Nicotine  patch   Cardiac ECHO showed EF 55-60%, normal LV function, no RWMA.  Maintaining MAP > 65    GI EtOH Cirrhosis Prior Hep C positive 02/2024; RNA Quant negative  History of alcohol  abuse and previous DT  Presumed last drink prior to hospitalization 11/19, was treated with ativan  and phenobarb in [facility]  Presently out of withdrawal window  MELD score 20; 19.6 % 3 month mortality  Sedation > wean off fent, precedex  as needed. Stop versed     - wean for RASS 0 to -1 - Thiamine , folic acid , MVI - Pepcid  20 BID   ID Leukocytosis Fevers  Afebrile overnight Blood and sputum cultures ngtd - Continue current ABX   Endo > Hyperglycemia  CBG goal 140-180  SSI moderate + TF    Renal Hypernatremia - will benefit from free H20 flushes - cortrak monday  Hypokalemia  Replete, recheck  Heme Anemia of chronic disease  Macrocytic anemia 2/2 chronic Etoh use Hgb 8.3, MCV 111 - Monitor  - Transfuse if Hgb < 7   Chronic thrombocytopenia, range ~20-50s  Liver synthetic dysfunction  Transfuse with FFP and platelets if active bleeding Avoid blood thinners   MSK/Other Hx of L femur fx requiring L ORIF 12/2021 Hx of R fibula fx requiring R intramedullary nailing 12/2021  History of recurrent hemarthrosis of the L knee  - On exam, no erythema or edema noted.   Nutrition: TF   DVT Prophylaxis: SCDs Bowel: Colace BID, PRN Miralax    The patient is critically ill due to respiratory failure, encephalopathy.  Critical care was necessary to treat or prevent imminent or life-threatening deterioration.  Critical care was time spent personally by me on the  following activities: development of treatment plan with patient and/or surrogate as well as nursing, discussions with consultants, evaluation of patient's response to treatment, examination of patient, obtaining history from patient or surrogate, ordering and performing treatments and interventions, ordering and review of laboratory studies, ordering and review of radiographic studies, pulse oximetry, re-evaluation of patient's condition and participation in multidisciplinary rounds.   Critical care was time spent personally by me on the following activities: blood gas interpretation, weaning of mechanical ventilation, and administration of IV electrolytes , development of treatment plan with patient and/or surrogate as well as nursing, discussions with consultants, evaluation of patient's response to treatment, examination of patient, obtaining history from patient or surrogate, ordering and performing treatments and interventions, ordering and review of laboratory studies, ordering and review of radiographic studies, pulse oximetry, re-evaluation of patient's condition and participation in multidisciplinary rounds.  Critical Care Time devoted to patient care services described in this note is 39 minutes. This time reflects time of care of this signee Ola Raap S Karna Abed . This critical care time does not reflect separately billable procedures or procedure time, teaching time or supervisory  time of PA/NP/Med student/Med Resident etc but could involve care discussion time.       Verdon GORMAN [facility] [place] 11/15/2024 12:01 PM  Pager: see AMION  If no response to pager , please call critical care on call (see AMION) until 7pm After 7:00 pm call Elink     Labs   CBC: Recent Labs  Lab 11/12/24 2124 11/12/24 2203 11/13/24 0518 11/13/24 0817 11/14/24 0337 11/14/24 2009 11/15/24 0102 11/15/24 0111 11/15/24 0500  WBC 11.8*  --  10.8*  --  18.6*  --  14.3*  --   --    HGB 8.9*   < > 8.6*   < > 8.3* 9.9* 9.4* 10.2* 8.8*  HCT 25.3*   < > 25.3*   < > 25.8* 29.0* 30.0* 30.0* 26.0*  MCV 108.1*  --  110.0*  --  111.2*  --  113.2*  --   --   PLT 56*  --  37*  --  43*  --  37*  --   --    < > = values in this interval not displayed.    Basic Metabolic Panel: Recent Labs  Lab 11/12/24 2124 11/12/24 2203 11/13/24 0518 11/13/24 0817 11/14/24 0337 11/14/24 2009 11/15/24 0102 11/15/24 0111 11/15/24 0500  NA 137   < > 140   < > 145 156* 152* 157* 155*  K 4.1   < > 4.3   < > 3.1* 3.1* 3.2* 3.3* 3.4*  CL 100  --  104  --  109  --  113*  --   --   CO2 24  --  22  --  29  --  29  --   --   GLUCOSE 95  --  134*  --  185*  --  109*  --   --   BUN 28*  --  30*  --  43*  --  40*  --   --   CREATININE 1.28*  --  1.36*  --  1.40*  --  1.13  --   --   CALCIUM  7.9*  --  7.7*  --  8.3*  --  8.9  --   --   MG 2.0  --   --   --  2.5*  --   --   --   --   PHOS  --   --  5.0*  4.9*  --  3.2  --  2.0*  --   --    < > = values in this interval not displayed.   GFR: Estimated Creatinine Clearance: 65.6 mL/min (by C-G formula based on SCr of 1.13 mg/dL). Recent Labs  Lab 11/12/24 2124 11/12/24 2206 11/13/24 0518 11/13/24 1002 11/14/24 0337 11/15/24 0102  WBC 11.8*  --  10.8*  --  18.6* 14.3*  LATICACIDVEN  --  2.1*  --  2.1* 1.7  --     Liver Function Tests: Recent Labs  Lab 11/12/24 2124 11/14/24 0337 11/15/24 0102  AST 99*  --   --   ALT 19  --   --   ALKPHOS 60  --   --   BILITOT 3.3*  --   --   PROT 4.7*  --   --   ALBUMIN  1.9* 1.9* 2.1*   No results for input(s): LIPASE, AMYLASE in the last 168 hours. Recent Labs  Lab 11/13/24 0518  AMMONIA 66*    ABG    Component Value  Date/Time   PHART 7.516 (H) 11/15/2024 0500   PCO2ART 43.9 11/15/2024 0500   PO2ART 50 (L) 11/15/2024 0500   HCO3 35.2 (H) 11/15/2024 0500   TCO2 36 (H) 11/15/2024 0500   O2SAT 86 11/15/2024 0500     Coagulation Profile: Recent Labs  Lab 11/13/24 0518  INR  1.8*    Cardiac Enzymes: No results for input(s): CKTOTAL, CKMB, CKMBINDEX, TROPONINI in the last 168 hours.  HbA1C: Hgb A1c MFr Bld  Date/Time Value Ref Range Status  10/22/2024 11:54 PM 4.2 (L) 4.8 - 5.6 % Final    Comment:    (NOTE) Diagnosis of Diabetes The following HbA1c ranges recommended by the American Diabetes Association (ADA) may be used as an aid in the diagnosis of diabetes mellitus.  Hemoglobin             Suggested A1C NGSP%              Diagnosis  <5.7                   Non Diabetic  5.7-6.4                Pre-Diabetic  >6.4                   Diabetic  <7.0                   Glycemic control for                       adults with diabetes.      CBG: Recent Labs  Lab 11/14/24 1945 11/15/24 0045 11/15/24 0343 11/15/24 0749 11/15/24 1138  GLUCAP 137* 95 120* 127* 131*

## 2024-11-15 NOTE — Progress Notes (Signed)
 [facility] ADULT ICU REPLACEMENT PROTOCOL   The patient does apply for the [facility] Adult ICU Electrolyte Replacment Protocol based on the criteria listed below:   1.Exclusion criteria: TCTS, ECMO, Dialysis, and Myasthenia Gravis patients 2. Is GFR >/= 30 ml/min? Yes.    Patient's GFR today is >60 3. Is SCr </= 2? Yes.   Patient's SCr is 1.13 mg/dL 4. Did SCr increase >/= 0.5 in 24 hours? No. 5.Pt's weight >40kg  Yes.   6. Abnormal electrolyte(s): K, Phos  7. Electrolytes replaced per protocol 8.  Call MD STAT for K+ </= 2.5, Phos </= 1, or Mag </= 1 Physician:  Rober Hunter BRAVO [facility] 11/15/2024 4:24 AM

## 2024-11-15 NOTE — Progress Notes (Signed)
 Called to bedside by RN for agitation. Self extubated before shift change to HHFNC. He continues to pull it off despite being in restraints. He is on precedex  at 0.6 but HR is only 47. Mildly tachypneic. Was given lasix  this morning for pulmonary edema. He is oriented to self and year. When he is calmer, O2 sats are okay. He was given phenobarbital  32.5mg  earlier. Ativan  x2. No PO access. Not reliably swallowing. Will place order for CIWA ativan  1-2mg  q1h for agitation, though he is 10 days out from alcohol  use? He is already receiving lactulose  and having diarrhea. No focal deficit. With PO access would consider seroquel, methadone. Continue close airway monitoring.   Tinnie FORBES Furth, PA-C  Pulmonary & Critical Care 11/15/24 12:50 AM  Please see [email or web address] for pager details.  From 7A-7P if no response, please call [phone] After hours, please call ELink [phone]

## 2024-11-16 ENCOUNTER — Inpatient Hospital Stay (HOSPITAL_COMMUNITY): Payer: MEDICAID

## 2024-11-16 LAB — POCT I-STAT 7, (LYTES, BLD GAS, ICA,H+H)
Acid-Base Excess: 15 mmol/L — ABNORMAL HIGH (ref 0.0–2.0)
Acid-Base Excess: 16 mmol/L — ABNORMAL HIGH (ref 0.0–2.0)
Acid-Base Excess: 16 mmol/L — ABNORMAL HIGH (ref 0.0–2.0)
Bicarbonate: 39 mmol/L — ABNORMAL HIGH (ref 20.0–28.0)
Bicarbonate: 37.9 mmol/L — ABNORMAL HIGH (ref 20.0–28.0)
Bicarbonate: 39.6 mmol/L — ABNORMAL HIGH (ref 20.0–28.0)
Calcium, Ion: 1.16 mmol/L (ref 1.15–1.40)
Calcium, Ion: 1.08 mmol/L — ABNORMAL LOW (ref 1.15–1.40)
Calcium, Ion: 1.13 mmol/L — ABNORMAL LOW (ref 1.15–1.40)
HCT: 30 % — ABNORMAL LOW (ref 39.0–52.0)
HCT: 48 % (ref 39.0–52.0)
HCT: 29 % — ABNORMAL LOW (ref 39.0–52.0)
Hemoglobin: 10.2 g/dL — ABNORMAL LOW (ref 13.0–17.0)
Hemoglobin: 16.3 g/dL (ref 13.0–17.0)
Hemoglobin: 9.9 g/dL — ABNORMAL LOW (ref 13.0–17.0)
O2 Saturation: 87 %
O2 Saturation: 100 %
O2 Saturation: 100 %
Patient temperature: 101.5
Patient temperature: 101.6
Patient temperature: 101.6
Potassium: 4 mmol/L (ref 3.5–5.1)
Potassium: 3.1 mmol/L — ABNORMAL LOW (ref 3.5–5.1)
Potassium: 3.1 mmol/L — ABNORMAL LOW (ref 3.5–5.1)
Sodium: 150 mmol/L — ABNORMAL HIGH (ref 135–145)
Sodium: 149 mmol/L — ABNORMAL HIGH (ref 135–145)
Sodium: 150 mmol/L — ABNORMAL HIGH (ref 135–145)
TCO2: 40 mmol/L — ABNORMAL HIGH (ref 22–32)
TCO2: 39 mmol/L — ABNORMAL HIGH (ref 22–32)
TCO2: 41 mmol/L — ABNORMAL HIGH (ref 22–32)
pCO2 arterial: 49.1 mmHg — ABNORMAL HIGH (ref 32–48)
pCO2 arterial: 37.2 mmHg (ref 32–48)
pCO2 arterial: 44.5 mmHg (ref 32–48)
pH, Arterial: 7.514 — ABNORMAL HIGH (ref 7.35–7.45)
pH, Arterial: 7.62 (ref 7.35–7.45)
pH, Arterial: 7.562 — ABNORMAL HIGH (ref 7.35–7.45)
pO2, Arterial: 53 mmHg — ABNORMAL LOW (ref 83–108)
pO2, Arterial: 386 mmHg — ABNORMAL HIGH (ref 83–108)
pO2, Arterial: 208 mmHg — ABNORMAL HIGH (ref 83–108)

## 2024-11-16 LAB — MAGNESIUM
Magnesium: 1.7 mg/dL (ref 1.7–2.4)
Magnesium: 2.4 mg/dL (ref 1.7–2.4)

## 2024-11-16 LAB — BASIC METABOLIC PANEL WITH GFR
Anion gap: 11 (ref 5–15)
Anion gap: 10 (ref 5–15)
BUN: 31 mg/dL — ABNORMAL HIGH (ref 6–20)
BUN: 33 mg/dL — ABNORMAL HIGH (ref 6–20)
CO2: 35 mmol/L — ABNORMAL HIGH (ref 22–32)
CO2: 35 mmol/L — ABNORMAL HIGH (ref 22–32)
Calcium: 7.9 mg/dL — ABNORMAL LOW (ref 8.9–10.3)
Calcium: 8.3 mg/dL — ABNORMAL LOW (ref 8.9–10.3)
Chloride: 98 mmol/L (ref 98–111)
Chloride: 103 mmol/L (ref 98–111)
Creatinine, Ser: 1.39 mg/dL — ABNORMAL HIGH (ref 0.61–1.24)
Creatinine, Ser: 1.62 mg/dL — ABNORMAL HIGH (ref 0.61–1.24)
GFR, Estimated: >60 mL/min (ref >60)
GFR, Estimated: 55 mL/min — ABNORMAL LOW (ref >60)
Glucose, Bld: 333 mg/dL — ABNORMAL HIGH (ref 70–99)
Glucose, Bld: 141 mg/dL — ABNORMAL HIGH (ref 70–99)
Potassium: 3.9 mmol/L (ref 3.5–5.1)
Potassium: 3.6 mmol/L (ref 3.5–5.1)
Sodium: 144 mmol/L (ref 135–145)
Sodium: 148 mmol/L — ABNORMAL HIGH (ref 135–145)

## 2024-11-16 LAB — RENAL FUNCTION PANEL
Albumin: 2.1 g/dL — ABNORMAL LOW (ref 3.5–5.0)
Anion gap: 11 (ref 5–15)
BUN: 30 mg/dL — ABNORMAL HIGH (ref 6–20)
CO2: 35 mmol/L — ABNORMAL HIGH (ref 22–32)
Calcium: 8.3 mg/dL — ABNORMAL LOW (ref 8.9–10.3)
Chloride: 106 mmol/L (ref 98–111)
Creatinine, Ser: 1.22 mg/dL (ref 0.61–1.24)
GFR, Estimated: >60 mL/min (ref >60)
Glucose, Bld: 116 mg/dL — ABNORMAL HIGH (ref 70–99)
Phosphorus: 3.2 mg/dL (ref 2.5–4.6)
Potassium: 3.2 mmol/L — ABNORMAL LOW (ref 3.5–5.1)
Sodium: 152 mmol/L — ABNORMAL HIGH (ref 135–145)

## 2024-11-16 LAB — BODY FLUID CELL COUNT WITH DIFFERENTIAL
Eos, Fluid: 3 %
Lymphs, Fluid: 7 %
Monocyte-Macrophage-Serous Fluid: 11 % — ABNORMAL LOW (ref 50–90)
Neutrophil Count, Fluid: 79 % — ABNORMAL HIGH (ref 0–25)
Total Nucleated Cell Count, Fluid: 380 uL (ref 0–1000)

## 2024-11-16 LAB — CBC
HCT: 28.4 % — ABNORMAL LOW (ref 39.0–52.0)
Hemoglobin: 9.4 g/dL — ABNORMAL LOW (ref 13.0–17.0)
MCH: 36.6 pg — ABNORMAL HIGH (ref 26.0–34.0)
MCHC: 33.1 g/dL (ref 30.0–36.0)
MCV: 110.5 fL — ABNORMAL HIGH (ref 80.0–100.0)
Platelets: 38 K/uL — ABNORMAL LOW (ref 150–400)
RBC: 2.57 MIL/uL — ABNORMAL LOW (ref 4.22–5.81)
RDW: 14.1 % (ref 11.5–15.5)
WBC: 11 K/uL — ABNORMAL HIGH (ref 4.0–10.5)
nRBC: 0.2 % (ref 0.0–0.2)

## 2024-11-16 LAB — GLUCOSE, CAPILLARY
Glucose-Capillary: 85 mg/dL (ref 70–99)
Glucose-Capillary: 117 mg/dL — ABNORMAL HIGH (ref 70–99)
Glucose-Capillary: 136 mg/dL — ABNORMAL HIGH (ref 70–99)
Glucose-Capillary: 121 mg/dL — ABNORMAL HIGH (ref 70–99)
Glucose-Capillary: 109 mg/dL — ABNORMAL HIGH (ref 70–99)

## 2024-11-16 LAB — MRSA NEXT GEN BY PCR, NASAL: MRSA by PCR Next Gen: NOT DETECTED

## 2024-11-16 LAB — AMMONIA: Ammonia: 34 umol/L (ref 9–35)

## 2024-11-16 MED ORDER — MIDAZOLAM HCL (PF) 2 MG/2ML IJ SOLN: 2.0000 mg | Freq: Once | INTRAMUSCULAR | Status: AC

## 2024-11-16 MED ORDER — IPRATROPIUM-ALBUTEROL 0.5-2.5 (3) MG/3ML IN SOLN
3.0000 mL | RESPIRATORY_TRACT | Status: AC
  Administered 2024-11-16 (×3): 3 mL via RESPIRATORY_TRACT
  Filled 2024-11-16 (×3): qty 3

## 2024-11-16 MED ORDER — ETOMIDATE 2 MG/ML IV SOLN
INTRAVENOUS | Status: AC
  Administered 2024-11-16: 20 mg via INTRAVENOUS
  Filled 2024-11-16: qty 10

## 2024-11-16 MED ORDER — CALCIUM GLUCONATE-NACL 2-0.675 GM/100ML-% IV SOLN
2.0000 g | Freq: Once | INTRAVENOUS | Status: AC
  Administered 2024-11-17: 2000 mg via INTRAVENOUS
  Filled 2024-11-16: qty 100

## 2024-11-16 MED ORDER — MAGNESIUM SULFATE 2 GM/50ML IV SOLN
2.0000 g | Freq: Once | INTRAVENOUS | Status: AC
  Administered 2024-11-16: 2 g via INTRAVENOUS
  Filled 2024-11-16: qty 50

## 2024-11-16 MED ORDER — ROCURONIUM BROMIDE 10 MG/ML (PF) SYRINGE: 50.0000 mg | PREFILLED_SYRINGE | INTRAVENOUS | Status: AC

## 2024-11-16 MED ORDER — MIDAZOLAM HCL 2 MG/2ML IJ SOLN
INTRAMUSCULAR | Status: DC
  Filled 2024-11-16: qty 4

## 2024-11-16 MED ORDER — NOREPINEPHRINE 4 MG/250ML-% IV SOLN
0.0000 ug/min | INTRAVENOUS | Status: DC
  Administered 2024-11-16: 5 ug/min via INTRAVENOUS
  Administered 2024-11-17: 2 ug/min via INTRAVENOUS
  Filled 2024-11-16: qty 250

## 2024-11-16 MED ORDER — POTASSIUM CHLORIDE 10 MEQ/50ML IV SOLN
10.0000 meq | INTRAVENOUS | Status: AC
  Administered 2024-11-16 (×6): 10 meq via INTRAVENOUS
  Filled 2024-11-16 (×6): qty 50

## 2024-11-16 MED ORDER — NOREPINEPHRINE 4 MG/250ML-% IV SOLN
INTRAVENOUS | Status: AC
  Administered 2024-11-16: 10 ug/min via INTRAVENOUS
  Filled 2024-11-16: qty 250

## 2024-11-16 MED ORDER — PROPOFOL 1000 MG/100ML IV EMUL
0.0000 ug/kg/min | INTRAVENOUS | Status: DC
  Administered 2024-11-17: 40 ug/kg/min via INTRAVENOUS
  Administered 2024-11-17: 30 ug/kg/min via INTRAVENOUS
  Administered 2024-11-17: 5 ug/kg/min via INTRAVENOUS
  Administered 2024-11-17: 50 ug/kg/min via INTRAVENOUS
  Administered 2024-11-18: 25 ug/kg/min via INTRAVENOUS
  Administered 2024-11-18: 40 ug/kg/min via INTRAVENOUS
  Administered 2024-11-19: 45 ug/kg/min via INTRAVENOUS
  Administered 2024-11-19 (×2): 40 ug/kg/min via INTRAVENOUS
  Administered 2024-11-20: 50 ug/kg/min via INTRAVENOUS
  Administered 2024-11-20: 40 ug/kg/min via INTRAVENOUS
  Filled 2024-11-16: qty 100
  Filled 2024-11-16: qty 200
  Filled 2024-11-16 (×8): qty 100

## 2024-11-16 MED ORDER — ROCURONIUM BROMIDE 10 MG/ML (PF) SYRINGE
PREFILLED_SYRINGE | INTRAVENOUS | Status: AC
  Administered 2024-11-16: 50 mg via INTRAVENOUS
  Filled 2024-11-16: qty 10

## 2024-11-16 MED ORDER — GUAIFENESIN 100 MG/5ML PO LIQD
5.0000 mL | Freq: Four times a day (QID) | ORAL | Status: DC
  Administered 2024-11-16 – 2024-11-23 (×30): 5 mL
  Filled 2024-11-16 (×6): qty 5
  Filled 2024-11-16: qty 15
  Filled 2024-11-16 (×3): qty 5
  Filled 2024-11-16: qty 15
  Filled 2024-11-16 (×18): qty 5
  Filled 2024-11-16: qty 15

## 2024-11-16 MED ORDER — HYDROXYZINE HCL 10 MG PO TABS
10.0000 mg | ORAL_TABLET | Freq: Three times a day (TID) | ORAL | Status: DC
  Administered 2024-11-16 – 2024-11-23 (×23): 10 mg
  Filled 2024-11-16 (×26): qty 1

## 2024-11-16 MED ORDER — VANCOMYCIN HCL IN DEXTROSE 1-5 GM/200ML-% IV SOLN
1000.0000 mg | INTRAVENOUS | Status: DC
  Administered 2024-11-16: 1000 mg via INTRAVENOUS
  Filled 2024-11-16: qty 200

## 2024-11-16 MED ORDER — SODIUM CHLORIDE 0.9 % IV SOLN: INTRAVENOUS | Status: AC | PRN

## 2024-11-16 MED ORDER — FENTANYL CITRATE (PF) 50 MCG/ML IJ SOSY
PREFILLED_SYRINGE | INTRAMUSCULAR | Status: DC
  Filled 2024-11-16: qty 2

## 2024-11-16 MED ORDER — ACETAMINOPHEN 325 MG PO TABS
650.0000 mg | ORAL_TABLET | Freq: Three times a day (TID) | ORAL | Status: DC | PRN
  Administered 2024-11-16 – 2024-11-18 (×2): 650 mg
  Filled 2024-11-16 (×3): qty 2

## 2024-11-16 MED ORDER — GUAIFENESIN ER 600 MG PO TB12: 600.0000 mg | ORAL_TABLET | Freq: Two times a day (BID) | ORAL | Status: DC

## 2024-11-16 MED ORDER — FUROSEMIDE 10 MG/ML IJ SOLN
40.0000 mg | Freq: Once | INTRAMUSCULAR | Status: AC
  Administered 2024-11-16: 40 mg via INTRAVENOUS
  Filled 2024-11-16: qty 4

## 2024-11-16 MED ORDER — LACTATED RINGERS IV BOLUS
1000.0000 mL | Freq: Once | INTRAVENOUS | Status: AC
  Administered 2024-11-16: 1000 mL via INTRAVENOUS

## 2024-11-16 MED ORDER — AMLODIPINE BESYLATE 10 MG PO TABS
10.0000 mg | ORAL_TABLET | Freq: Every day | ORAL | Status: DC
  Filled 2024-11-16: qty 2

## 2024-11-16 MED ORDER — FREE WATER
120.0000 mL | Status: AC
  Administered 2024-11-16 – 2024-11-17 (×11): 120 mL

## 2024-11-16 MED ORDER — ETOMIDATE 2 MG/ML IV SOLN: 20.0000 mg | INTRAVENOUS | Status: AC

## 2024-11-16 MED ORDER — WHITE PETROLATUM EX OINT
TOPICAL_OINTMENT | CUTANEOUS | Status: DC | PRN
  Filled 2024-11-16: qty 28.35

## 2024-11-16 MED ORDER — MIDAZOLAM HCL 2 MG/2ML IJ SOLN
INTRAMUSCULAR | Status: AC
  Administered 2024-11-16: 2 mg via INTRAVENOUS
  Filled 2024-11-16: qty 2

## 2024-11-16 NOTE — Procedures (Signed)
 Arterial Catheter Insertion Procedure Note  Gregory Murphy  [id number]  03/02/1986  Date:11/16/24  Time:5:53 PM    Provider Performing: Chanele Douglas Pleas    Procedure: Insertion of Arterial Line (63379) with US  guidance (23062)   Indication(s) Blood pressure monitoring and/or need for frequent ABGs  Consent Risks of the procedure as well as the alternatives and risks of each were explained to the patient and/or caregiver.  Consent for the procedure was obtained and is signed in the bedside chart  Anesthesia None   Time Out Verified patient identification, verified procedure, site/side was marked, verified correct patient position, special equipment/implants available, medications/allergies/relevant history reviewed, required imaging and test results available.   Sterile Technique Maximal sterile technique including full sterile barrier drape, hand hygiene, sterile gown, sterile gloves, mask, hair covering, sterile ultrasound probe cover (if used).   Procedure Description Area of catheter insertion was cleaned with chlorhexidine  and draped in sterile fashion. With real-time ultrasound guidance an arterial catheter was placed into the right radial artery.  Appropriate arterial tracings confirmed on monitor.     Complications/Tolerance None; patient tolerated the procedure well.   EBL Minimal   Specimen(s) None

## 2024-11-16 NOTE — Progress Notes (Signed)
 ABG results given to Dr. Pleas. No new orders received at this time. RT will continue to be available as needed.

## 2024-11-16 NOTE — Procedures (Signed)
 Intubation Procedure Note  Lamon Rotundo  [id number]  1986-03-26  Date:11/16/24  Time:1:38 PM   Provider Performing:Ioma Chismar Heddy    Procedure: Intubation (31500)  Indication(s) Respiratory Failure  Consent Risks of the procedure as well as the alternatives and risks of each were explained to the patient and/or caregiver.  Consent for the procedure was obtained and is signed in the bedside chart   Anesthesia Etomidate and Rocuronium    Time Out Verified patient identification, verified procedure, site/side was marked, verified correct patient position, special equipment/implants available, medications/allergies/relevant history reviewed, required imaging and test results available.   Sterile Technique Usual hand hygeine, masks, and gloves were used   Procedure Description Patient positioned in bed supine.  Sedation given as noted above.  Patient was intubated with endotracheal tube using Glidescope.  View was Grade 1 full glottis .  Number of attempts was 1.  Colorimetric CO2 detector was consistent with tracheal placement.   Complications/Tolerance None; patient tolerated the procedure well. Chest X-ray is ordered to verify placement.   EBL Minimal   Specimen(s) None

## 2024-11-16 NOTE — Progress Notes (Addendum)
 I called patient's mother Ellouise, who reported that patient does not have a legal wife. He has never been married. Patient's mother consented to the intubation.   Family meeting with Ellouise is scheduled tomorrow

## 2024-11-16 NOTE — Plan of Care (Signed)
  Problem: Health Behavior/Discharge Planning: Goal: Ability to manage health-related needs will improve Outcome: Not Progressing   Problem: Clinical Measurements: Goal: Respiratory complications will improve Outcome: Progressing Goal: Cardiovascular complication will be avoided Outcome: Progressing

## 2024-11-16 NOTE — Progress Notes (Signed)
 [facility] ADULT ICU REPLACEMENT PROTOCOL   The patient does apply for the [facility] Adult ICU Electrolyte Replacment Protocol based on the criteria listed below:   1.Exclusion criteria: TCTS, ECMO, Dialysis, and Myasthenia Gravis patients 2. Is GFR >/= 30 ml/min? Yes.    Patient's GFR today is >60 3. Is SCr </= 2? Yes.   Patient's SCr is 1.22 mg/dL 4. Did SCr increase >/= 0.5 in 24 hours? No. 5.Pt's weight >40kg  Yes.   6. Abnormal electrolyte(s): K, Mag  7. Electrolytes replaced per protocol 8.  Call MD STAT for K+ </= 2.5, Phos </= 1, or Mag </= 1 Physician:  Fate Hunter BRAVO [facility] 11/16/2024 6:38 AM

## 2024-11-16 NOTE — Progress Notes (Signed)
 Pharmacy Antibiotic Note  Gregory Murphy is a 38 y.o. male transferred to [facility] on 11/12/2024 with PNa/ARDS and encephalopathy.  Patient has been on Zosyn .  Worsening tachypnea and CXR, so Pharmacy has been consulted to restart vancomycin .   SCr 1.22, Tmax 101.6, WBC down to 11   Plan: Vanc 1gm IV Q24H for AUC 497 using SCr 1.22 Continue Zosyn  EID 3.375gm IV Q8H Monitor renal fxn, clinical progress, vanc level as indicated  Weight: 51.1 kg (112 lb 10.5 oz)  Temp (24hrs), Avg:101 F (38.3 C), Min:98.7 F (37.1 C), Max:101.9 F (38.8 C)  Recent Labs  Lab 11/12/24 2124 11/12/24 2206 11/13/24 0518 11/13/24 1002 11/14/24 0337 11/15/24 0102 11/16/24 0500  WBC 11.8*  --  10.8*  --  18.6* 14.3* 11.0*  CREATININE 1.28*  --  1.36*  --  1.40* 1.13 1.22  LATICACIDVEN  --  2.1*  --  2.1* 1.7  --   --   VANCORANDOM  --   --  18  --   --   --   --     Estimated Creatinine Clearance: 59.3 mL/min (by C-G formula based on SCr of 1.22 mg/dL).    Allergies  Allergen Reactions   Latex Itching    Gloves     Azith 11/27 >> 11/29 Zosyn  11/27 >> 12/2 Vanc x1 11/27 Zyvox 12/1 >>   11/27 VR = 18 mcg/mL   11/27 MRSA PCR - negative 11/27 BCx - NGTD 11/28 TA - negative  12/1 TA -  12/1 MRSA PCR -   [name] D. Lendell, PharmD, BCPS, BCCCP 11/16/2024, 10:44 AM

## 2024-11-16 NOTE — Progress Notes (Addendum)
 NAME:  Gregory Murphy, MRN:  [id number], DOB:  May 10, 1986, LOS: 4 ADMISSION DATE:  11/12/2024, CONSULTATION DATE:  11/27 REFERRING MD:  [facility] Provider, CHIEF COMPLAINT:  ARDS    History of Present Illness:  Patient is a 38 yo M w/ pertinent PMH alcohol  abuse, alcoholic liver cirrhosis, chronic pancytopenia presents to [facility] on 11/27 with ards.   Patient has prior traumatic injury to left femur requiring rod placement from left hip to knee.  Has had prior left knee effusion requiring arthrocentesis back in July 2025.  Patient recently admitted on 11/7-11/14 for left knee effusion requiring arthrocentesis.  While in hospital patient became encephalopathic likely alcohol  withdrawal requiring brief Precedex , Librium  taper, CIWA protocol.   Patient recently started on methadone 3 days prior to this admit.  Patient admitted to [facility] on 11/20 with AMS presumed 2/2 to polysubstance abuse and possible withdrawal. Intbubated for airway protection.  Patient required intubation.  CT chest with b/l peripheral and perivascular groundglass . Ct head no acute abnormality. Patient was extubated on 11/25. While in hospital worsening withdrawal despite ativan  and phenobarb, started on precedex . Developed respiratory failure and reintubated on 11/27.  Patient with increasing O2 requirements and ARDS.    On 11/27 transferred to [facility] for fever, resp failure, possible intentional methadone OD, PNA, AKI, and encephalopathy due to HE and DT.   Pertinent  Medical History   Past Medical History:  Diagnosis Date   Alcohol  withdrawal (HCC) 03/01/2024   Alcoholic cirrhosis of liver with ascites (HCC) 03/02/2024   Alcoholic gastritis 03/03/2024   Alcoholic hepatitis with ascites (HCC) 03/02/2024   Brain injury (HCC)    Cirrhosis with alcoholism (HCC)    Critical polytrauma 12/17/2021   Decompensated cirrhosis (HCC) 03/10/2024   Depression    Elevated AFP 03/03/2024   HCV antibody positive  03/03/2024   Insomnia    Macrocytic anemia 03/03/2024   Malnutrition of moderate degree 12/21/2021   Panic attack    Severe thrombocytopenia 03/03/2024   Splenic rupture 09/07/2023     Significant Hospital Events: Including procedures, antibiotic start and stop dates in addition to other pertinent events   11/27 Admitted to ICU, re intubated, started on Zithromax  and Zosyn   11/28 Stop stress dose steroids, decrease RR > 18; weaned off fent and versed   11/29 self extubated, placed on HHFNC   Interim History / Subjective:  O/N: Febrile with Tmax 101.6   Patient evaluated at bedside, confused. Does not awake or follow commands. Tangential speaking, does not answer questions appropriately   Objective    Blood pressure (!) 167/91, pulse 76, temperature (!) 101.6 F (38.7 C), temperature source Axillary, resp. rate (!) 30, weight 54.1 kg, SpO2 97%.    FiO2 (%):  [55 %-80 %] 60 %   Intake/Output Summary (Last 24 hours) at 11/16/2024 9367 Last data filed at 11/16/2024 0600 Gross per 24 hour  Intake 895.61 ml  Output 4825 ml  Net -3929.39 ml   Filed Weights   11/13/24 0500 11/14/24 0342 11/15/24 0500  Weight: 59.2 kg 59.6 kg 54.1 kg    Examination: General: appears ill and older than stated age  HENT: ETT with secretions  Lungs: Decrease breath sounds bibasilar lung fields, diffuse crackles  Cardiovascular: RR Abdomen: soft, ND, +BS  Extremities: LE edema resolved  Neuro:  GU: foley    Labs: Na 152 (155), K 3.2 (3.4), Bicarb 35, BUN 30 (40), Scr 1.22 (1.13) WBC 11 (14.3), Hgb 9.4 (9.4), MCV 110, PLT 38 (  37)  Mag 1.7  CBG 85   UOP 4.8 L, net -3.8 L   Trach aspirate 11/28 showed normal respiratory flora, no staph or pseudo  Blood cultures x 2 NG MRSA negative   Resolved problem list  Lactic acidosis - resolved  AKI  Assessment and Plan   Neuro Acute metabolic encephalopathy > waxing and waning Delirium  Acute hepatic encephalopathy  Hyperammoniemia  Opioid  dependence on Methadone - F/u Ammonia  - Lactulose  20 TID with goal BM 3 per day [per cor track]   - Delirium precautions  - F/u Ammonia  - Wean off precedex  > Start Atarax  10 TID   Respiratory Acute respiratory failure requiring intubation 11/20 > then extubated 11/25;  re-intubated 11/27 d/t hypoxemia > self extubated 11/29  ARDS 2/2 to chronic alcohol  use and PNA  CXR this morning showed worsening of the R opacity, blurring of the R cardiac borders compared to 11/27 CXR  - F/u on Sputum aspirate, ABG - If patient continues to be Tachypneic despite HHFNC > high risk for re-intubation  - continue HHFNC, titrate down as tolerated   - FiO2 50 with 45L this AM  - complete azithromycin , piperacillin -tazobactam - Diuresis with IV lasix  11/28-11/30 > UOP 4.8 L, net -3.8 L today; appears euvolemic on exam  - remains high risk for reintubation - Mucinex  600 BID - Duonebs Q4H scheduled   Tobacco Use Disorder with 7 pack year  14 mg Nicotine  patch   Cardiac ECHO showed EF 55-60%, normal LV function, no RWMA.  Maintaining MAP > 65    GI EtOH Cirrhosis Prior Hep C positive 02/2024; RNA Quant negative  History of alcohol  abuse and previous DT  Presumed last drink prior to hospitalization 11/19, was treated with ativan  and phenobarb in [facility]  Presently out of withdrawal window  MELD score 20; 19.6 % 3 month mortality  Sedation > precedex  as needed. Consider Atarax  10 TID + Consider Seroquel 25 BID  - wean for RASS 0 to -1 - Thiamine , folic acid , MVI - Pepcid  20 BID   ID Leukocytosis Fevers  Febrile overnight with Tmax 101.6; WBC improving  Blood and sputum cultures ngtd - Continue current ABX for 5 days   Endo > Hyperglycemia  CBG goal 140-180  SSI moderate + TF [cor track today]   Renal Hypernatremia 2/2 decrease PO intake and hypovolemia   NA 152 (155) Total water  deficit 1.5 L > 120 mL Q2H  - IV lasix  40 today - Monitor   Hypokalemia  Replete   Heme Anemia  of chronic disease  Macrocytic anemia 2/2 chronic Etoh use - Monitor  - Transfuse if Hgb < 7   Chronic thrombocytopenia, range ~20-50s  Liver synthetic dysfunction  Transfuse with FFP and platelets if active bleeding Avoid blood thinners   MSK/Other Hx of L femur fx requiring L ORIF 12/2021 Hx of R fibula fx requiring R intramedullary nailing 12/2021  History of recurrent hemarthrosis of the L knee  - On exam, no erythema or edema noted.   Nutrition: Cor track today, TF DVT Prophylaxis: SCDs Bowel: Colace BID, PRN Miralax     Labs   CBC: Recent Labs  Lab 11/12/24 2124 11/12/24 2203 11/13/24 0518 11/13/24 0817 11/14/24 0337 11/14/24 2009 11/15/24 0102 11/15/24 0111 11/15/24 0500 11/16/24 0500  WBC 11.8*  --  10.8*  --  18.6*  --  14.3*  --   --  11.0*  HGB 8.9*   < > 8.6*   < >  8.3* 9.9* 9.4* 10.2* 8.8* 9.4*  HCT 25.3*   < > 25.3*   < > 25.8* 29.0* 30.0* 30.0* 26.0* 28.4*  MCV 108.1*  --  110.0*  --  111.2*  --  113.2*  --   --  110.5*  PLT 56*  --  37*  --  43*  --  37*  --   --  38*   < > = values in this interval not displayed.    Basic Metabolic Panel: Recent Labs  Lab 11/12/24 2124 11/12/24 2203 11/13/24 0518 11/13/24 0817 11/14/24 0337 11/14/24 2009 11/15/24 0102 11/15/24 0111 11/15/24 0500 11/16/24 0500  NA 137   < > 140   < > 145 156* 152* 157* 155* 152*  K 4.1   < > 4.3   < > 3.1* 3.1* 3.2* 3.3* 3.4* 3.2*  CL 100  --  104  --  109  --  113*  --   --  106  CO2 24  --  22  --  29  --  29  --   --  35*  GLUCOSE 95  --  134*  --  185*  --  109*  --   --  116*  BUN 28*  --  30*  --  43*  --  40*  --   --  30*  CREATININE 1.28*  --  1.36*  --  1.40*  --  1.13  --   --  1.22  CALCIUM  7.9*  --  7.7*  --  8.3*  --  8.9  --   --  8.3*  MG 2.0  --   --   --  2.5*  --   --   --   --  1.7  PHOS  --   --  5.0*  4.9*  --  3.2  --  2.0*  --   --  3.2   < > = values in this interval not displayed.   GFR: Estimated Creatinine Clearance: 60.7 mL/min (by C-G  formula based on SCr of 1.22 mg/dL). Recent Labs  Lab 11/12/24 2206 11/13/24 0518 11/13/24 1002 11/14/24 0337 11/15/24 0102 11/16/24 0500  WBC  --  10.8*  --  18.6* 14.3* 11.0*  LATICACIDVEN 2.1*  --  2.1* 1.7  --   --     Liver Function Tests: Recent Labs  Lab 11/12/24 2124 11/14/24 0337 11/15/24 0102 11/16/24 0500  AST 99*  --   --   --   ALT 19  --   --   --   ALKPHOS 60  --   --   --   BILITOT 3.3*  --   --   --   PROT 4.7*  --   --   --   ALBUMIN  1.9* 1.9* 2.1* 2.1*   No results for input(s): LIPASE, AMYLASE in the last 168 hours. Recent Labs  Lab 11/13/24 0518  AMMONIA 66*    ABG    Component Value Date/Time   PHART 7.516 (H) 11/15/2024 0500   PCO2ART 43.9 11/15/2024 0500   PO2ART 50 (L) 11/15/2024 0500   HCO3 35.2 (H) 11/15/2024 0500   TCO2 36 (H) 11/15/2024 0500   O2SAT 86 11/15/2024 0500     Coagulation Profile: Recent Labs  Lab 11/13/24 0518  INR 1.8*    Cardiac Enzymes: No results for input(s): CKTOTAL, CKMB, CKMBINDEX, TROPONINI in the last 168 hours.  HbA1C: Hgb A1c MFr Bld  Date/Time Value Ref Range  Status  10/22/2024 11:54 PM 4.2 (L) 4.8 - 5.6 % Final    Comment:    (NOTE) Diagnosis of Diabetes The following HbA1c ranges recommended by the American Diabetes Association (ADA) may be used as an aid in the diagnosis of diabetes mellitus.  Hemoglobin             Suggested A1C NGSP%              Diagnosis  <5.7                   Non Diabetic  5.7-6.4                Pre-Diabetic  >6.4                   Diabetic  <7.0                   Glycemic control for                       adults with diabetes.      CBG: Recent Labs  Lab 11/15/24 1138 11/15/24 1552 11/15/24 1924 11/15/24 2312 11/16/24 0310  GLUCAP 131* 107* 112* 122* 85

## 2024-11-16 NOTE — Progress Notes (Addendum)
 eLink Physician-Brief Progress Note Patient Name: Jaxtyn Linville DOB: 04/22/1986 MRN: [id number]   Date of Service  11/16/2024  HPI/Events of Note  AMS secondary to alcohol  withdrawal and acute hepatic encephalopathy requiring 2 intubations this admission   eICU Interventions  Add wrist restraints to belt for patient safety   2302 -patient is bradycardic on Precedex  with frequent ectopy.  Repeat labs pending.  QTc prolonged on EKG, additional magnesium  supplementation.    2347 -he seems to be alternating between intermittent pauses and tachyarrhythmias.  Low urine output for the shift-200 cc, net negative for the day.  Transition off of Precedex  to propofol .  Magnesium  infusing.  And calcium .  1 L bolus  Intervention Category Minor Interventions: Agitation / anxiety - evaluation and management  Delroy Ordway 11/16/2024, 7:32 PM

## 2024-11-16 NOTE — TOC Initial Note (Signed)
 Transition of Care [facility]) - Initial/Assessment Note    Patient Details  Name: Gregory Murphy MRN: [id number] Date of Birth: Oct 20, 1986  Transition of [facility]) CM/SW Contact:    Roxie KANDICE Stain, RN Phone Number: 11/16/2024, 12:07 PM  Clinical Narrative:                 Per chart review patient lives with parents. Patient has no PCP, once patient is oriented ICM (Inpatient Care Management) will discuss with patient.  Patient not medically stable for discharge.  NCM will continue to follow as patient progresses with care towards discharge.       Barriers to Discharge: Continued Medical Work up   Patient Goals and CMS Choice            Expected Discharge Plan and Services       Living arrangements for the past 2 months: Single Family Home                                      Prior Living Arrangements/Services Living arrangements for the past 2 months: Single Family Home Lives with:: Parents                   Activities of Daily Living      Permission Sought/Granted                  Emotional Assessment              Admission diagnosis:  Acute respiratory failure with hypoxemia (HCC) [J96.01] Patient Active Problem List   Diagnosis Date Noted   Malnutrition of moderate degree 11/13/2024   Acute respiratory failure with hypoxemia (HCC) 11/12/2024   Vitamin D  deficiency 10/24/2024   Effusion of left knee 10/23/2024   Hemarthrosis of left knee 10/23/2024   Bilirubinuria 10/08/2024   Left knee pain 07/14/2024   Alcohol  abuse 07/14/2024   Pancytopenia (HCC) 07/14/2024   Decompensated cirrhosis (HCC) 03/10/2024   Alcoholic gastritis 03/03/2024   Severe thrombocytopenia 03/03/2024   Electrolyte abnormality 03/03/2024   Macrocytic anemia 03/03/2024   Tobacco abuse 03/03/2024   HCV antibody positive 03/03/2024   Elevated AFP 03/03/2024   Alcoholic hepatitis with ascites (HCC) 03/02/2024   Alcoholic cirrhosis of liver with  ascites (HCC) 03/02/2024   Alcohol  withdrawal (HCC) 03/01/2024   Open displaced fracture of distal phalanx of left thumb 02/04/2024   Splenic rupture 09/07/2023   Right tibial fracture 12/22/2021   Severe protein-calorie malnutrition 12/21/2021   Femur fracture (HCC) 12/17/2021   PCP:  Patient, [name] Pharmacy:   [id number] - [place], [place] - [address] [place] [address] Phone: [phone] Fax: [phone]     Social Drivers of Health (SDOH) Social History: SDOH Screenings   Food Insecurity: Patient Unable To Answer (11/13/2024)  Housing: Unknown (11/13/2024)  Transportation Needs: Patient Unable To Answer (11/13/2024)  Utilities: Patient Unable To Answer (11/13/2024)  Social Connections: Unknown (10/23/2024)  Tobacco Use: High Risk (10/23/2024)   SDOH Interventions: Housing Interventions: Patient Unable to Answer Transportation Interventions: Patient Unable to Answer Utilities Interventions: Patient Unable to Answer   Readmission Risk Interventions    10/26/2024   11:33 AM 03/10/2024    5:12 PM  Readmission Risk Prevention Plan  Transportation Screening Complete Complete  PCP or Specialist Appt within 5-7 Days  --  PCP or Specialist Appt within 3-5  Days Complete   Home Care Screening  Complete  Medication Review (RN CM)  Complete  HRI or Home Care Consult Complete   Social Work Consult for Recovery Care Planning/Counseling Complete   Palliative Care Screening Not Applicable   Medication Review Oceanographer) Complete

## 2024-11-16 NOTE — Procedures (Signed)
 Bedside Bronchoscopy Procedure Note Gregory Murphy [id number] Dec 25, 1985  Procedure: Bronchoscopy Indications: Diagnostic evaluation of the airways, Obtain specimens for culture and/or other diagnostic studies, and Remove secretions  Procedure Details: ET Tube Size: 7.5 ET Tube secured at lip (cm): 23 Bite block in place: No In preparation for procedure, Patient hyper-oxygenated with 100 % FiO2 Airway entered and the following bronchi were examined: RLL and LLL.   Bronchoscope removed.  , Patient placed back on 100% FiO2 at conclusion of procedure.    Evaluation BP 111/72   Pulse (!) 106   Temp (!) 101.6 F (38.7 C) (Axillary)   Resp (!) 24   Wt 51.1 kg   SpO2 100%   BMI 21.29 kg/m  Breath Sounds:Diminished O2 sats: stable throughout Patient's Current Condition: stable Specimens:  Sent serosanguinous fluid Complications: No apparent complications Patient did tolerate procedure well.   Gregory Murphy R 11/16/2024, 1:55 PM

## 2024-11-16 NOTE — Progress Notes (Signed)
 Nutrition Follow-up  DOCUMENTATION CODES:  Non-severe (moderate) malnutrition in context of social or environmental circumstances  INTERVENTION:  Once stable, restart tube feeding via cortrak: Vital 1.5 at 50 ml/h (1200 ml per day) Start at 20 and advance by 10mL every 8 hours to reach goal Prosource TF20 60 ml 1x/d Provides 1880 kcal, 101 gm protein, 917 ml free water daily Continue MVI, thiamine , folic acid  daily  NUTRITION DIAGNOSIS:  Moderate Malnutrition related to social / environmental circumstances (alcohol  abuse) as evidenced by mild muscle depletion, mild fat depletion. - remains applicable  GOAL:  Patient will meet greater than or equal to 90% of their needs - progressing  MONITOR:  TF tolerance, I & O's, Vent status, Labs  REASON FOR ASSESSMENT:  Ventilator, Consult Enteral/tube feeding initiation and management  ASSESSMENT:  Pt with hx of alcohol  abuse, cirrhosis, methadone use for opioid dependence, and hx TBI presented to ED with respiratory failure, AMS, and possible methadone overdose.  11/20 - presented to [facility], intubated 11/25 - extubated 11/27 - reintubated for worsening withdrawal symptoms and transferred to [facility]  11/29 - self-extubated 12/1 - cortrak placed (gastric)  Pt resting in bed at the time of assessment. Extubated, but mentation poor this AM. No family members at bedside.   Cortrak was placed this AM for medication administration. TF on hold for now as respiratory status is tenuous and pt sliding himself down in bed, at risk for aspiration. Will monitor and restart feeds when able.   Admit weight: 59.2 kg  Current weight: 51.1 kg    Intake/Output Summary (Last 24 hours) at 11/16/2024 9166 Last data filed at 11/16/2024 0700 Gross per 24 hour  Intake 718.48 ml  Output 4825 ml  Net -4106.52 ml  Net IO Since Admission: -6,790.12 mL [11/16/24 0833]  Drains/Lines: PICC triple lumen UOP 4825 mL x 24 hours FMS recorded  yesterday  Nutritionally Relevant Medications: Scheduled Meds:  cholecalciferol  1,000 Units Per Tube Daily   PROSource TF20  60 mL Per Tube Daily   folic acid   1 mg Intravenous Daily   insulin  aspart  0-15 Units Subcutaneous Q4H   lactulose   20 g Per Tube TID   multivitamin with minerals  1 tablet Per Tube Daily   thiamine   100 mg Intravenous Daily   Continuous Infusions:  famotidine  (PEPCID ) IV Stopped (11/15/24 2221)   feeding supplement (VITAL 1.5 CAL) Stopped (11/14/24 2117)   piperacillin -tazobactam (ZOSYN )  IV 12.5 mL/hr at 11/16/24 0800   potassium chloride  10 mEq (11/16/24 0803)   PRN Meds: polyethylene glycol  Labs Reviewed: Sodium 152 Potassium 3.2 CO2 35 BUN 30 CBG ranges from 85-136 mg/dL over the last 24 hours HgbA1c 4.2% (11/6)  NUTRITION - FOCUSED PHYSICAL EXAM: Flowsheet Row Most Recent Value  Orbital Region Mild depletion  Upper Arm Region Moderate depletion  Thoracic and Lumbar Region Mild depletion  Buccal Region Mild depletion  Temple Region Mild depletion  Clavicle Bone Region No depletion  Clavicle and Acromion Bone Region Mild depletion  Scapular Bone Region No depletion  Dorsal Hand Unable to assess  Patellar Region Mild depletion  Anterior Thigh Region Mild depletion  Posterior Calf Region Moderate depletion  Edema (RD Assessment) Moderate  [bilateral forearms, feet]  Hair Reviewed  Eyes Reviewed  Mouth Reviewed  Skin Reviewed  Nails Unable to assess  [mittens]    Diet Order:   Diet Order             Diet NPO time specified  Except for: Other (See Comments)  Diet effective now                   EDUCATION NEEDS:  Not appropriate for education at this time  Skin:  Skin Assessment: Reviewed RN Assessment  Last BM:  unsure FMS in place since  11/29  Height:  Ht Readings from Last 1 Encounters:  10/25/24 5' 1 (1.549 m)    Weight:  Wt Readings from Last 1 Encounters:  11/16/24 51.1 kg    Ideal Body Weight:  50.9  kg  BMI:  Body mass index is 21.29 kg/m.  Estimated Nutritional Needs:  Kcal:  1800-2000 kcal/d Protein:  90-105g/d Fluid:  2L/d    Gregory Murphy, RD, LDN, CNSC Registered Dietitian II Please reach out via secure chat

## 2024-11-16 NOTE — Progress Notes (Signed)
 Mentation is poor despite lowering precedex  Ammonia is within normal limits Pt now has clotrak and received ammonia  ABG shows severe hypoxia Pt remains tachynpnic  Family updated, mother has consented for intubation Will proceed with intubation  Will do Bronchoscopy BAL after intubation to r/o DAH and new pneumonia  This patient is critically ill with organ(s) system failure which requires frequent high complexity decision making, assessment, support, evaluation, and titration of therapies. This was completed through the application of advanced monitoring technologies and extensive interpretation of multiple databases.  During this encounter critical care time was devoted to patient care services described in this note for 35 minutes.  Additional CC time  40 min

## 2024-11-16 NOTE — Procedures (Signed)
 Bronchoscopy Procedure Note  Gregory Murphy  [id number]  1986/03/07  Date:11/16/24  Time:2:13 PM   Provider Performing:Derrius Furtick   Procedure(s):  Flexible bronchoscopy with bronchial alveolar lavage (68375)  Indication(s) Acute respiratory failure  Consent Risks of the procedure as well as the alternatives and risks of each were explained to the patient and/or caregiver.  Verbal Consent for any necessary procedures was obtained from mother earlier  Anesthesia Pt intubated and sedated in ICU   Time Out Verified patient identification, verified procedure, site/side was marked, verified correct patient position, special equipment/implants available, medications/allergies/relevant history reviewed, required imaging and test results available.   Sterile Technique Usual hand hygiene, masks, gowns, and gloves were used   Procedure Description Bronchoscope advanced through endotracheal tube and into airway.  Airways were examined down to subsegmental level with findings noted below.   Following diagnostic evaluation, BAL(s) performed in right lower lobe with 40 cc normal saline and return of 10 fluid and in left lower lobe with 40cc saline and return of 15 cc clear with occasional sputum  Findings: no bleeding Scant mucous   Complications/Tolerance None; patient tolerated the procedure well. Chest X-ray is not needed post procedure.   EBL Minimal   Specimen(s) right lower lobe BAL and left lower lobe bal

## 2024-11-16 NOTE — Procedures (Signed)
 Cortrak  Person Inserting Tube:  Merilee, Chanoch Mccleery L, RD Tube Type:  Cortrak - 43 inches Tube Size:  10 Tube Location:  Left nare Secured by: Bridle Technique Used to Measure Tube Placement:  Marking at nare/corner of mouth Cortrak Secured At:  63 cm Initial Placement Verification:  Xray  Cortrak Tube Team Note:  Consult received to place a Cortrak feeding tube.   X-ray is required. RN may begin using tube once placement is confirmed.   If the tube becomes dislodged please keep the tube and contact the Cortrak team at [email or web address] for replacement.  If after hours and replacement cannot be delayed, place a NG tube and confirm placement with an abdominal x-ray.    Nestora Merilee RD, LDN Registered Dietitian I Please see AMION for contact information

## 2024-11-17 ENCOUNTER — Inpatient Hospital Stay (HOSPITAL_COMMUNITY): Payer: MEDICAID

## 2024-11-17 DIAGNOSIS — R9431 Abnormal electrocardiogram [ECG] [EKG]: Secondary | ICD-10-CM

## 2024-11-17 LAB — RENAL FUNCTION PANEL
Albumin: 1.8 g/dL — ABNORMAL LOW (ref 3.5–5.0)
Anion gap: 8 (ref 5–15)
BUN: 31 mg/dL — ABNORMAL HIGH (ref 6–20)
CO2: 36 mmol/L — ABNORMAL HIGH (ref 22–32)
Calcium: 8.6 mg/dL — ABNORMAL LOW (ref 8.9–10.3)
Chloride: 105 mmol/L (ref 98–111)
Creatinine, Ser: 1.57 mg/dL — ABNORMAL HIGH (ref 0.61–1.24)
GFR, Estimated: 57 mL/min — ABNORMAL LOW (ref >60)
Glucose, Bld: 127 mg/dL — ABNORMAL HIGH (ref 70–99)
Phosphorus: 4.2 mg/dL (ref 2.5–4.6)
Potassium: 3 mmol/L — ABNORMAL LOW (ref 3.5–5.1)
Sodium: 149 mmol/L — ABNORMAL HIGH (ref 135–145)

## 2024-11-17 LAB — GLUCOSE, CAPILLARY
Glucose-Capillary: 110 mg/dL — ABNORMAL HIGH (ref 70–99)
Glucose-Capillary: 116 mg/dL — ABNORMAL HIGH (ref 70–99)
Glucose-Capillary: 120 mg/dL — ABNORMAL HIGH (ref 70–99)
Glucose-Capillary: 127 mg/dL — ABNORMAL HIGH (ref 70–99)
Glucose-Capillary: 148 mg/dL — ABNORMAL HIGH (ref 70–99)
Glucose-Capillary: 88 mg/dL (ref 70–99)
Glucose-Capillary: 88 mg/dL (ref 70–99)

## 2024-11-17 LAB — POCT I-STAT 7, (LYTES, BLD GAS, ICA,H+H)
Acid-Base Excess: 12 mmol/L — ABNORMAL HIGH (ref 0.0–2.0)
Bicarbonate: 36.7 mmol/L — ABNORMAL HIGH (ref 20.0–28.0)
Calcium, Ion: 1.22 mmol/L (ref 1.15–1.40)
HCT: 32 % — ABNORMAL LOW (ref 39.0–52.0)
Hemoglobin: 10.9 g/dL — ABNORMAL LOW (ref 13.0–17.0)
O2 Saturation: 99 %
Patient temperature: 98.7
Potassium: 4.1 mmol/L (ref 3.5–5.1)
Sodium: 150 mmol/L — ABNORMAL HIGH (ref 135–145)
TCO2: 38 mmol/L — ABNORMAL HIGH (ref 22–32)
pCO2 arterial: 48.3 mmHg — ABNORMAL HIGH (ref 32–48)
pH, Arterial: 7.489 — ABNORMAL HIGH (ref 7.35–7.45)
pO2, Arterial: 133 mmHg — ABNORMAL HIGH (ref 83–108)

## 2024-11-17 LAB — ECHOCARDIOGRAM LIMITED
Calc EF: 48.3 %
S' Lateral: 3.7 cm
Single Plane A2C EF: 50.9 %
Single Plane A4C EF: 46.6 %
Weight: 1816.59 [oz_av]

## 2024-11-17 LAB — CBC
HCT: 27.3 % — ABNORMAL LOW (ref 39.0–52.0)
Hemoglobin: 8.9 g/dL — ABNORMAL LOW (ref 13.0–17.0)
MCH: 35.6 pg — ABNORMAL HIGH (ref 26.0–34.0)
MCHC: 32.6 g/dL (ref 30.0–36.0)
MCV: 109.2 fL — ABNORMAL HIGH (ref 80.0–100.0)
Platelets: 34 K/uL — ABNORMAL LOW (ref 150–400)
RBC: 2.5 MIL/uL — ABNORMAL LOW (ref 4.22–5.81)
RDW: 14.2 % (ref 11.5–15.5)
WBC: 9 K/uL (ref 4.0–10.5)
nRBC: 0 % (ref 0.0–0.2)

## 2024-11-17 LAB — CULTURE, BLOOD (ROUTINE X 2)
Culture: NO GROWTH
Culture: NO GROWTH
Special Requests: ADEQUATE
Special Requests: ADEQUATE

## 2024-11-17 LAB — TRIGLYCERIDES: Triglycerides: 122 mg/dL (ref <150)

## 2024-11-17 LAB — KOH PREP: KOH Prep: NONE SEEN

## 2024-11-17 LAB — MAGNESIUM: Magnesium: 2.8 mg/dL — ABNORMAL HIGH (ref 1.7–2.4)

## 2024-11-17 MED ORDER — ALTEPLASE 2 MG IJ SOLR: 2.0000 mg | Freq: Once | INTRAMUSCULAR | Status: AC

## 2024-11-17 MED ORDER — ALTEPLASE 2 MG IJ SOLR
INTRAMUSCULAR | Status: AC
  Administered 2024-11-17: 2 mg
  Filled 2024-11-17: qty 2

## 2024-11-17 MED ORDER — ORAL CARE MOUTH RINSE
15.0000 mL | OROMUCOSAL | Status: DC
  Administered 2024-11-17 – 2024-11-21 (×53): 15 mL via OROMUCOSAL

## 2024-11-17 MED ORDER — ALTEPLASE 2 MG IJ SOLR
2.0000 mg | Freq: Once | INTRAMUSCULAR | Status: DC
  Filled 2024-11-17: qty 2

## 2024-11-17 MED ORDER — LACTATED RINGERS IV BOLUS
500.0000 mL | Freq: Once | INTRAVENOUS | Status: AC
  Administered 2024-11-17: 500 mL via INTRAVENOUS

## 2024-11-17 MED ORDER — VANCOMYCIN HCL 750 MG/150ML IV SOLN
750.0000 mg | INTRAVENOUS | Status: DC
  Administered 2024-11-17 – 2024-11-18 (×2): 750 mg via INTRAVENOUS
  Filled 2024-11-17 (×3): qty 150

## 2024-11-17 MED ORDER — FAMOTIDINE 20 MG PO TABS
20.0000 mg | ORAL_TABLET | Freq: Every day | ORAL | Status: DC
  Administered 2024-11-18 – 2024-11-19 (×2): 20 mg
  Filled 2024-11-17 (×2): qty 1

## 2024-11-17 MED ORDER — THIAMINE MONONITRATE 100 MG PO TABS
100.0000 mg | ORAL_TABLET | Freq: Every day | ORAL | Status: DC
  Administered 2024-11-18 – 2024-11-23 (×6): 100 mg
  Filled 2024-11-17 (×6): qty 1

## 2024-11-17 MED ORDER — POTASSIUM CHLORIDE 10 MEQ/50ML IV SOLN
10.0000 meq | INTRAVENOUS | Status: AC
  Administered 2024-11-17 (×6): 10 meq via INTRAVENOUS
  Filled 2024-11-17 (×6): qty 50

## 2024-11-17 MED ORDER — ORAL CARE MOUTH RINSE: 15.0000 mL | OROMUCOSAL | Status: DC | PRN

## 2024-11-17 MED ORDER — POTASSIUM CHLORIDE 20 MEQ PO PACK
40.0000 meq | PACK | Freq: Once | ORAL | Status: AC
  Administered 2024-11-17: 40 meq
  Filled 2024-11-17: qty 2

## 2024-11-17 MED ORDER — PIPERACILLIN-TAZOBACTAM 3.375 G IVPB
3.3750 g | Freq: Three times a day (TID) | INTRAVENOUS | Status: DC
  Administered 2024-11-17 – 2024-11-18 (×3): 3.375 g via INTRAVENOUS
  Filled 2024-11-17 (×3): qty 50

## 2024-11-17 MED ORDER — FOLIC ACID 1 MG PO TABS
1.0000 mg | ORAL_TABLET | Freq: Every day | ORAL | Status: DC
  Administered 2024-11-18 – 2024-11-23 (×6): 1 mg
  Filled 2024-11-17 (×6): qty 1

## 2024-11-17 NOTE — Progress Notes (Signed)
 Brief Nutrition Support Note  Pt with hypoxia yesterday and required re-intubation and bronchoscopy. Cortrak remains in place. Discussed with provider, ok to restart feeds.    Recommend the following: INTERVENTION:  Restart tube feeding via cortrak: Vital 1.5 at 50 ml/h (1200 ml per day) Start at 30 and advance by 10mL every 4 hours to reach goal Prosource TF20 60 ml 1x/d Provides 1880 kcal, 101 gm protein, 917 ml free water daily Continue MVI, thiamine , folic acid  daily   Vernell Lukes, RD, LDN, CNSC Registered Dietitian II Please reach out via secure chat

## 2024-11-17 NOTE — Progress Notes (Signed)
  Progress Note   Date: 11/17/2024  Patient Name: Gregory Murphy        MRN#: [id number]  Clarification of the diagnosis of pulmonary edema:  Other explanation of clinical findings (please specify) likely pneumonia vs volume overload

## 2024-11-17 NOTE — Plan of Care (Signed)
  Problem: Clinical Measurements: Goal: Diagnostic test results will improve Outcome: Not Progressing Goal: Respiratory complications will improve Outcome: Not Progressing Goal: Cardiovascular complication will be avoided Outcome: Not Progressing

## 2024-11-17 NOTE — Progress Notes (Signed)
 PICC troubleshooting: RUE PICC (placed at an outside facility) white lumen (#3) occluded. Sluggish blood return after tPA administered. PICC is noted to be in the distal upper arm. Dressing removed to further assess line: found to be kinked. Line out 2cm. Adjusted the curvature of line to remove kinking and fit under transparent dressing. Discussed with Harlene, RN.

## 2024-11-17 NOTE — Progress Notes (Signed)
  Echocardiogram 2D Echocardiogram has been performed.  Devora Ellouise SAUNDERS 11/17/2024, 11:11 AM

## 2024-11-17 NOTE — Plan of Care (Signed)
  Problem: Clinical Measurements: Goal: Ability to maintain clinical measurements within normal limits will improve Outcome: Progressing Goal: Will remain free from infection Outcome: Progressing Goal: Diagnostic test results will improve Outcome: Progressing Goal: Respiratory complications will improve Outcome: Progressing Goal: Cardiovascular complication will be avoided Outcome: Progressing   Problem: Nutrition: Goal: Adequate nutrition will be maintained Outcome: Progressing   Problem: Coping: Goal: Level of anxiety will decrease Outcome: Progressing   Problem: Elimination: Goal: Will not experience complications related to bowel motility Outcome: Progressing Goal: Will not experience complications related to urinary retention Outcome: Progressing   Problem: Pain Managment: Goal: General experience of comfort will improve and/or be controlled Outcome: Progressing   Problem: Safety: Goal: Ability to remain free from injury will improve Outcome: Progressing   Problem: Skin Integrity: Goal: Risk for impaired skin integrity will decrease Outcome: Progressing   Problem: Fluid Volume: Goal: Ability to maintain a balanced intake and output will improve Outcome: Progressing   Problem: Metabolic: Goal: Ability to maintain appropriate glucose levels will improve Outcome: Progressing   Problem: Nutritional: Goal: Maintenance of adequate nutrition will improve Outcome: Progressing Goal: Progress toward achieving an optimal weight will improve Outcome: Progressing   Problem: Skin Integrity: Goal: Risk for impaired skin integrity will decrease Outcome: Progressing   Problem: Tissue Perfusion: Goal: Adequacy of tissue perfusion will improve Outcome: Progressing   Problem: Safety: Goal: Non-violent Restraint(s) Outcome: Progressing     Problem: Activity: Goal: Risk for activity intolerance will decrease Outcome: Not Progressing

## 2024-11-17 NOTE — Progress Notes (Signed)
 Pharmacy Antibiotic Note  Gregory Murphy is a 38 y.o. male transferred to [facility] on 11/12/2024 with PNa/ARDS and encephalopathy.  Patient has been on Zosyn .  Worsening tachypnea and CXR, so Pharmacy has been consulted to restart vancomycin .   SCr trended up, now afebrile and WBC normalized.  Plan: Reduce vanc to 750mg  IV Q24H for AUC 469 using SCr 1.57 Continue Zosyn  EID 3.375gm IV Q8H Monitor renal fxn, micro data, vanc level as indicated  Weight: 51.5 kg (113 lb 8.6 oz)  Temp (24hrs), Avg:100.7 F (38.2 C), Min:97.9 F (36.6 C), Max:101.9 F (38.8 C)  Recent Labs  Lab 11/12/24 2206 11/13/24 0518 11/13/24 1002 11/14/24 0337 11/15/24 0102 11/16/24 0500 11/16/24 1447 11/16/24 2245 11/17/24 0500  WBC  --  10.8*  --  18.6* 14.3* 11.0*  --   --  9.0  CREATININE  --  1.36*  --  1.40* 1.13 1.22 1.39* 1.62* 1.57*  LATICACIDVEN 2.1*  --  2.1* 1.7  --   --   --   --   --   VANCORANDOM  --  18  --   --   --   --   --   --   --     Estimated Creatinine Clearance: 46.5 mL/min (A) (by C-G formula based on SCr of 1.57 mg/dL (H)).    Allergies  Allergen Reactions   Latex Itching    Gloves     Azith 11/27 >> 11/29 Zosyn  11/27 >> 12/2 Vanc x1 11/27 Zyvox 12/1 >>   11/27 VR = 18 mcg/mL   11/27 MRSA PCR - negative 11/27 BCx - NGTD 11/28 TA - negative  12/1 TA -  12/1 MRSA PCR -   Loring Liskey D. Lendell, PharmD, BCPS, BCCCP 11/17/2024, 7:06 AM

## 2024-11-17 NOTE — Progress Notes (Addendum)
 NAME:  Gregory Murphy, MRN:  [id number], DOB:  September 04, 1986, LOS: 5 ADMISSION DATE:  11/12/2024, CONSULTATION DATE:  11/27 REFERRING MD:  [facility] Provider, CHIEF COMPLAINT:  ARDS    History of Present Illness:  Patient is a 38 yo M w/ pertinent PMH alcohol  abuse, alcoholic liver cirrhosis, chronic pancytopenia presents to [facility] on 11/27 with ards.   Patient has prior traumatic injury to left femur requiring rod placement from left hip to knee.  Has had prior left knee effusion requiring arthrocentesis back in July 2025.  Patient recently admitted on 11/7-11/14 for left knee effusion requiring arthrocentesis.  While in hospital patient became encephalopathic likely alcohol  withdrawal requiring brief Precedex , Librium  taper, CIWA protocol.   Patient recently started on methadone 3 days prior to this admit.  Patient admitted to [facility] on 11/20 with AMS presumed 2/2 to polysubstance abuse and possible withdrawal. Intbubated for airway protection.  Patient required intubation.  CT chest with b/l peripheral and perivascular groundglass . Ct head no acute abnormality. Patient was extubated on 11/25. While in hospital worsening withdrawal despite ativan  and phenobarb, started on precedex . Developed respiratory failure and reintubated on 11/27.  Patient with increasing O2 requirements and ARDS.    On 11/27 transferred to [facility] for fever, resp failure, possible intentional methadone OD, PNA, AKI, and encephalopathy due to HE and DT.   Pertinent  Medical History   Past Medical History:  Diagnosis Date   Alcohol  withdrawal (HCC) 03/01/2024   Alcoholic cirrhosis of liver with ascites (HCC) 03/02/2024   Alcoholic gastritis 03/03/2024   Alcoholic hepatitis with ascites (HCC) 03/02/2024   Brain injury (HCC)    Cirrhosis with alcoholism (HCC)    Critical polytrauma 12/17/2021   Decompensated cirrhosis (HCC) 03/10/2024   Depression    Elevated AFP 03/03/2024   HCV antibody positive  03/03/2024   Insomnia    Macrocytic anemia 03/03/2024   Malnutrition of moderate degree 12/21/2021   Panic attack    Severe thrombocytopenia 03/03/2024   Splenic rupture 09/07/2023     Significant Hospital Events: Including procedures, antibiotic start and stop dates in addition to other pertinent events   11/27 Admitted to ICU, re intubated, started on Zithromax  and Zosyn   11/28 Stop stress dose steroids, decrease RR > 18; weaned off fent and versed   11/29 self extubated, placed on [facility]  12/1 Reintubated d/t increased tachypnea and hypoxia; Precedex  stopped > prop   Interim History / Subjective:  O/N: Bradycardiac > frequent ectomy > QTc prolonged > transitioned off precedex  to prop and given IV Mag and calcium  > 1 L bolus   Objective    Blood pressure 106/72, pulse 62, temperature 97.9 F (36.6 C), temperature source Axillary, resp. rate 14, weight 51.5 kg, SpO2 100%.    Vent Mode: PRVC FiO2 (%):  [40 %-100 %] 40 % Set Rate:  [18 bmp-24 bmp] 18 bmp Vt Set:  [420 mL] 420 mL PEEP:  [5 cmH20-10 cmH20] 5 cmH20 Plateau Pressure:  [20 cmH20-32 cmH20] 20 cmH20   Intake/Output Summary (Last 24 hours) at 11/17/2024 9376 Last data filed at 11/17/2024 0532 Gross per 24 hour  Intake 2967.25 ml  Output 2850 ml  Net 117.25 ml   Filed Weights   11/15/24 0500 11/16/24 0500 11/17/24 0500  Weight: 54.1 kg 51.1 kg 51.5 kg    Examination: General: appears ill and older than stated age  HENT: ETT with secretions  Lungs:  Cardiovascular: RR Abdomen: soft, ND, +BS  Extremities: LE edema resolved  Neuro:  GU: foley    Labs: WBC 9.0, Hgb 8.9, MCV 109.2, PLT 34 (38) Na 149, K 3.0, Scr 1.57 (1.62), Mag 2.8   UOP 2.5 L, net +44   Trach aspirate 11/28 showed normal respiratory flora, no staph or pseudo  Blood cultures x 2 NG MRSA negative   Resolved problem list  Lactic acidosis - resolved  AKI  Assessment and Plan   Neuro Acute metabolic encephalopathy > waxing and waning  Delirium  Acute hepatic encephalopathy  Hyperammoniemia  Opioid dependence on Methadone - Ammonia 34 <66 <40  - Lactulose  20 TID with goal BM 3 per day [per cor track]   - Delirium precautions  - weaned off precedex  > Prop d/t arrhythmias   Respiratory Acute respiratory failure requiring intubation 11/20 > then extubated 11/25;  re-intubated 11/27 d/t hypoxemia > self extubated 11/29; re-intubated 12/1  ARDS 2/2 to chronic alcohol  use and Multilobar PNA  - 12/1 BAL RLL > rare GPC, pending - 12/1 BAL LLL > rare GPC, pending  - Sputum pending  - continue zosyn  and Vanc for now - completed azithromycin ,  - Diuresis with IV lasix  11/28-12/1 - Mucinex  600 BID - Duonebs Q4H scheduled   Tobacco Use Disorder with 7 pack year  14 mg Nicotine  patch   Cardiac Alternating between Tachyarrhythmia/ bradycardia with prolonged QTC Hypokalemia F/u limited ECHO  Maintaining MAP > 65   Aggressively replete electrolytes   Hypotension Requiring intermittent Levophed for MAP > 65; now off  Likely from excessive volume loss   GI EtOH Cirrhosis Prior Hep C positive 02/2024; RNA Quant negative  History of alcohol  abuse and previous DT  Presumed last drink prior to hospitalization 11/19, was treated with ativan  and phenobarb in [facility]  Presently out of withdrawal window  MELD score 20; 19.6 % 3 month mortality  Sedation > Prop  - wean for RASS 0 to -1 - Thiamine , folic acid , MVI - Pepcid  20 BID   ID Leukocytosis - resolved  Fevers  - Blood cultures NG - BAL cultures pending   Endo > Hyperglycemia  CBG goal 140-180  SSI moderate + TF [cor track today]   Renal Hypernatremia 2/2 decrease PO intake and hypovolemia   AKI 2/2 over diuresis; baseline 0.8  Na 149 (148), Scr 1.57 < 1.62 <1.39   - Continue free water flushes - Hold diuresis  -Monitor Daily I/Os, Daily weight  -Maintain MAP>65 for optimal renal perfusion.  -Avoid nephrotoxic agents such as IV contrast, NSAIDs, and  phosphate containing bowel preps (FLEETS)  Hypokalemia  Replete   Heme Anemia of chronic disease  Macrocytic anemia 2/2 chronic Etoh use - Monitor  - Transfuse if Hgb < 7   Chronic thrombocytopenia, range ~20-50s  Liver synthetic dysfunction  Transfuse with FFP and platelets if active bleeding Avoid blood thinners   MSK/Other Hx of L femur fx requiring L ORIF 12/2021 Hx of R fibula fx requiring R intramedullary nailing 12/2021  History of recurrent hemarthrosis of the L knee  - On exam, no erythema or edema noted.   Nutrition: Cor track today, TF DVT Prophylaxis: SCDs Bowel: Colace BID, PRN Miralax     Labs   CBC: Recent Labs  Lab 11/13/24 0518 11/13/24 0817 11/14/24 0337 11/14/24 2009 11/15/24 0102 11/15/24 0111 11/16/24 0500 11/16/24 1120 11/16/24 1744 11/16/24 1852 11/17/24 0500  WBC 10.8*  --  18.6*  --  14.3*  --  11.0*  --   --   --  9.0  HGB 8.6*   < > 8.3*   < > 9.4*   < > 9.4* 10.2* 16.3 9.9* 8.9*  HCT 25.3*   < > 25.8*   < > 30.0*   < > 28.4* 30.0* 48.0 29.0* 27.3*  MCV 110.0*  --  111.2*  --  113.2*  --  110.5*  --   --   --  109.2*  PLT 37*  --  43*  --  37*  --  38*  --   --   --  34*   < > = values in this interval not displayed.    Basic Metabolic Panel: Recent Labs  Lab 11/12/24 2124 11/12/24 2203 11/13/24 0518 11/13/24 0817 11/14/24 0337 11/14/24 2009 11/15/24 0102 11/15/24 0111 11/16/24 0500 11/16/24 1120 11/16/24 1447 11/16/24 1744 11/16/24 1852 11/16/24 2245  NA 137   < > 140   < > 145   < > 152*   < > 152* 150* 144 149* 150* 148*  K 4.1   < > 4.3   < > 3.1*   < > 3.2*   < > 3.2* 4.0 3.9 3.1* 3.1* 3.6  CL 100  --  104  --  109  --  113*  --  106  --  98  --   --  103  CO2 24  --  22  --  29  --  29  --  35*  --  35*  --   --  35*  GLUCOSE 95  --  134*  --  185*  --  109*  --  116*  --  333*  --   --  141*  BUN 28*  --  30*  --  43*  --  40*  --  30*  --  31*  --   --  33*  CREATININE 1.28*  --  1.36*  --  1.40*  --  1.13  --   1.22  --  1.39*  --   --  1.62*  CALCIUM  7.9*  --  7.7*  --  8.3*  --  8.9  --  8.3*  --  7.9*  --   --  8.3*  MG 2.0  --   --   --  2.5*  --   --   --  1.7  --   --   --   --  2.4  PHOS  --   --  5.0*  4.9*  --  3.2  --  2.0*  --  3.2  --   --   --   --   --    < > = values in this interval not displayed.   GFR: Estimated Creatinine Clearance: 45 mL/min (A) (by C-G formula based on SCr of 1.62 mg/dL (H)). Recent Labs  Lab 11/12/24 2206 11/13/24 0518 11/13/24 1002 11/14/24 0337 11/15/24 0102 11/16/24 0500 11/17/24 0500  WBC  --    < >  --  18.6* 14.3* 11.0* 9.0  LATICACIDVEN 2.1*  --  2.1* 1.7  --   --   --    < > = values in this interval not displayed.    Liver Function Tests: Recent Labs  Lab 11/12/24 2124 11/14/24 0337 11/15/24 0102 11/16/24 0500  AST 99*  --   --   --   ALT 19  --   --   --   ALKPHOS 60  --   --   --  BILITOT 3.3*  --   --   --   PROT 4.7*  --   --   --   ALBUMIN  1.9* 1.9* 2.1* 2.1*   No results for input(s): LIPASE, AMYLASE in the last 168 hours. Recent Labs  Lab 11/13/24 0518 11/16/24 1056  AMMONIA 66* 34    ABG    Component Value Date/Time   PHART 7.562 (H) 11/16/2024 1852   PCO2ART 44.5 11/16/2024 1852   PO2ART 208 (H) 11/16/2024 1852   HCO3 39.6 (H) 11/16/2024 1852   TCO2 41 (H) 11/16/2024 1852   O2SAT 100 11/16/2024 1852     Coagulation Profile: Recent Labs  Lab 11/13/24 0518  INR 1.8*    Cardiac Enzymes: No results for input(s): CKTOTAL, CKMB, CKMBINDEX, TROPONINI in the last 168 hours.  HbA1C: Hgb A1c MFr Bld  Date/Time Value Ref Range Status  10/22/2024 11:54 PM 4.2 (L) 4.8 - 5.6 % Final    Comment:    (NOTE) Diagnosis of Diabetes The following HbA1c ranges recommended by the American Diabetes Association (ADA) may be used as an aid in the diagnosis of diabetes mellitus.  Hemoglobin             Suggested A1C NGSP%              Diagnosis  <5.7                   Non Diabetic  5.7-6.4                 Pre-Diabetic  >6.4                   Diabetic  <7.0                   Glycemic control for                       adults with diabetes.      CBG: Recent Labs  Lab 11/16/24 1146 11/16/24 1530 11/16/24 1950 11/17/24 0000 11/17/24 0329  GLUCAP 136* 121* 109* 148* 127*

## 2024-11-18 LAB — GLUCOSE, CAPILLARY
Glucose-Capillary: 110 mg/dL — ABNORMAL HIGH (ref 70–99)
Glucose-Capillary: 117 mg/dL — ABNORMAL HIGH (ref 70–99)
Glucose-Capillary: 118 mg/dL — ABNORMAL HIGH (ref 70–99)
Glucose-Capillary: 126 mg/dL — ABNORMAL HIGH (ref 70–99)
Glucose-Capillary: 128 mg/dL — ABNORMAL HIGH (ref 70–99)
Glucose-Capillary: 142 mg/dL — ABNORMAL HIGH (ref 70–99)

## 2024-11-18 LAB — RENAL FUNCTION PANEL
Albumin: 1.7 g/dL — ABNORMAL LOW (ref 3.5–5.0)
Anion gap: 8 (ref 5–15)
BUN: 31 mg/dL — ABNORMAL HIGH (ref 6–20)
CO2: 33 mmol/L — ABNORMAL HIGH (ref 22–32)
Calcium: 8.3 mg/dL — ABNORMAL LOW (ref 8.9–10.3)
Chloride: 109 mmol/L (ref 98–111)
Creatinine, Ser: 1.38 mg/dL — ABNORMAL HIGH (ref 0.61–1.24)
GFR, Estimated: >60 mL/min (ref >60)
Glucose, Bld: 137 mg/dL — ABNORMAL HIGH (ref 70–99)
Phosphorus: 2.8 mg/dL (ref 2.5–4.6)
Potassium: 3.6 mmol/L (ref 3.5–5.1)
Sodium: 150 mmol/L — ABNORMAL HIGH (ref 135–145)

## 2024-11-18 LAB — CBC
HCT: 26.6 % — ABNORMAL LOW (ref 39.0–52.0)
Hemoglobin: 8.6 g/dL — ABNORMAL LOW (ref 13.0–17.0)
MCH: 35.7 pg — ABNORMAL HIGH (ref 26.0–34.0)
MCHC: 32.3 g/dL (ref 30.0–36.0)
MCV: 110.4 fL — ABNORMAL HIGH (ref 80.0–100.0)
Platelets: 34 K/uL — ABNORMAL LOW (ref 150–400)
RBC: 2.41 MIL/uL — ABNORMAL LOW (ref 4.22–5.81)
RDW: 14.4 % (ref 11.5–15.5)
WBC: 9.8 K/uL (ref 4.0–10.5)
nRBC: 0 % (ref 0.0–0.2)

## 2024-11-18 LAB — CULTURE, BAL-QUANTITATIVE W GRAM STAIN
Culture: 10000 — AB
Culture: 20000 — AB

## 2024-11-18 LAB — MAGNESIUM: Magnesium: 2.3 mg/dL (ref 1.7–2.4)

## 2024-11-18 LAB — BRAIN NATRIURETIC PEPTIDE: B Natriuretic Peptide: 120.3 pg/mL — ABNORMAL HIGH (ref 0.0–100.0)

## 2024-11-18 MED ORDER — POTASSIUM CHLORIDE 20 MEQ PO PACK
40.0000 meq | PACK | Freq: Once | ORAL | Status: AC
  Administered 2024-11-18: 40 meq
  Filled 2024-11-18: qty 2

## 2024-11-18 MED ORDER — FREE WATER
100.0000 mL | Status: DC
  Administered 2024-11-18 (×2): 100 mL

## 2024-11-18 MED ORDER — LACTULOSE 10 GM/15ML PO SOLN
10.0000 g | Freq: Two times a day (BID) | ORAL | Status: DC
  Administered 2024-11-18 – 2024-11-20 (×5): 10 g
  Filled 2024-11-18 (×6): qty 15

## 2024-11-18 MED ORDER — CEFAZOLIN SODIUM-DEXTROSE 2-4 GM/100ML-% IV SOLN
2.0000 g | Freq: Three times a day (TID) | INTRAVENOUS | Status: DC
  Administered 2024-11-18: 2 g via INTRAVENOUS
  Filled 2024-11-18 (×3): qty 100

## 2024-11-18 MED ORDER — FREE WATER
100.0000 mL | Status: DC
  Administered 2024-11-18 – 2024-11-21 (×35): 100 mL

## 2024-11-18 NOTE — Progress Notes (Signed)
 eLink Physician-Brief Progress Note Patient Name: Gregory Murphy DOB: 10/15/1986 MRN: [id number]   Date of Service  11/18/2024  HPI/Events of Note  Notes state to continue free water flushes for hypernatremia but none are ordered  eICU Interventions  Stable sodium, can defer to primary team to adjust with feeds     Intervention Category Minor Interventions: Routine modifications to care plan (e.g. PRN medications for pain, fever)  Aislyn Hayse 11/18/2024, 5:27 AM

## 2024-11-18 NOTE — Progress Notes (Addendum)
 NAME:  Gregory Murphy, MRN:  [id number], DOB:  01/15/1986, LOS: 6 ADMISSION DATE:  11/12/2024, CONSULTATION DATE:  11/27 REFERRING MD:  [facility] Provider, CHIEF COMPLAINT:  ARDS    History of Present Illness:  Patient is a 38 yo M w/ pertinent PMH alcohol  abuse, alcoholic liver cirrhosis, chronic pancytopenia presents to [facility] on 11/27 with ards.   Patient has prior traumatic injury to left femur requiring rod placement from left hip to knee.  Has had prior left knee effusion requiring arthrocentesis back in July 2025.  Patient recently admitted on 11/7-11/14 for left knee effusion requiring arthrocentesis.  While in hospital patient became encephalopathic likely alcohol  withdrawal requiring brief Precedex , Librium  taper, CIWA protocol.   Patient recently started on methadone 3 days prior to this admit.  Patient admitted to [facility] on 11/20 with AMS presumed 2/2 to polysubstance abuse and possible withdrawal. Intbubated for airway protection.  Patient required intubation.  CT chest with b/l peripheral and perivascular groundglass . Ct head no acute abnormality. Patient was extubated on 11/25. While in hospital worsening withdrawal despite ativan  and phenobarb, started on precedex . Developed respiratory failure and reintubated on 11/27.  Patient with increasing O2 requirements and ARDS.    On 11/27 transferred to [facility] for fever, resp failure, possible intentional methadone OD, PNA, AKI, and encephalopathy due to HE and DT.   Pertinent  Medical History   Past Medical History:  Diagnosis Date   Alcohol  withdrawal (HCC) 03/01/2024   Alcoholic cirrhosis of liver with ascites (HCC) 03/02/2024   Alcoholic gastritis 03/03/2024   Alcoholic hepatitis with ascites (HCC) 03/02/2024   Brain injury (HCC)    Cirrhosis with alcoholism (HCC)    Critical polytrauma 12/17/2021   Decompensated cirrhosis (HCC) 03/10/2024   Depression    Elevated AFP 03/03/2024   HCV antibody positive  03/03/2024   Insomnia    Macrocytic anemia 03/03/2024   Malnutrition of moderate degree 12/21/2021   Panic attack    Severe thrombocytopenia 03/03/2024   Splenic rupture 09/07/2023     Significant Hospital Events: Including procedures, antibiotic start and stop dates in addition to other pertinent events   11/27 Admitted to ICU, re intubated, started on Zithromax  and Zosyn   11/28 Stop stress dose steroids, decrease RR > 18; weaned off fent and versed   11/29 self extubated, placed on HHFNC  12/1 Reintubated d/t increased tachypnea and hypoxia; Precedex  stopped > prop   Interim History / Subjective:  O/N: No acute events  Patient evaluated bedside this morning.  Patient was intubated and sedated.  Not able to participate in physician and patient interview.  Objective    Blood pressure 106/67, pulse (!) 102, temperature 100 F (37.8 C), temperature source Axillary, resp. rate 18, weight 51.5 kg, SpO2 100%.    Vent Mode: PRVC FiO2 (%):  [40 %] 40 % Set Rate:  [14 bmp] 14 bmp Vt Set:  [420 mL] 420 mL PEEP:  [5 cmH20-10 cmH20] 5 cmH20 Plateau Pressure:  [14 cmH20-22 cmH20] 14 cmH20   Intake/Output Summary (Last 24 hours) at 11/18/2024 0647 Last data filed at 11/18/2024 0600 Gross per 24 hour  Intake 2730.82 ml  Output 2175 ml  Net 555.82 ml   Filed Weights   11/15/24 0500 11/16/24 0500 11/17/24 0500  Weight: 54.1 kg 51.1 kg 51.5 kg   Vitals: Temperature: 100.2 F (100) Pulse 102 Respiration 18 Blood pressure: 90/63-145/70 off Levophed Satting at 100% on ventilator settings  Examination: General: Critically ill, intubated, sedated HENT: ET  tube in place, atraumatic, normocephalic Lungs: Coarse breath sounds to bilateral lower lung fields Cardiovascular: Tachycardic rate, regular rhythm, no murmurs, rubs, or gallops Abdomen: soft, nondistended, normal active bowel sounds Extremities: No lower extremity edema Neuro: Intubated, sedated, gag reflex intact, pupils equal  (dilated) and reactive to light, patient able to withdraw to pain GU: foley draining well   Labs: Magnesium  2.3 CBC white count 9.8, hemoglobin 8.6, MCV 110, platelets 34 RFP: Sodium 150, bicarb 33, creatinine 1.33 Glucose 116-120  UOP 2.1 L, net +555 mL  Aspirate from 12/01 with no fungus isolated BAL on 12/01 showing Staphylococcus aureus  Resolved problem list  Lactic acidosis - resolved   Assessment and Plan   Neuro Acute metabolic encephalopathy > waxing and waning Delirium  Acute hepatic encephalopathy  Hyperammoniemia  Opioid dependence on Methadone Patient evaluated bedside this morning.  He is currently on propofol .  Neuroexam is confounded by propofol .  However patient does withdraw to pain, and has equal pupils and reactive to light.  Will try to wean off sedation today.  Patient is having liquidy bowel movements. - Ammonia cleared, can try to start waking patient up, patient currently on propofol  - Continue lactulose  20 mg 3 times daily with goal bowel movements of 3 bowel movements/day - Delirium precautions   Respiratory Acute respiratory failure requiring intubation 11/20 > then extubated 11/25;  re-intubated 11/27 d/t hypoxemia > self extubated 11/29; re-intubated 12/1  ARDS 2/2 to chronic alcohol  use and Multilobar PNA  Patient evaluated bedside this morning.  Most recent ABG showing pO2 133 on 40 FiO2.  Patient is currently on Zosyn  and vancomycin .  Trach aspirate growing Staph aureus.  Patient did have negative MRSA nares, wonder if we can de-escalate antibiotics today.  Will continue to follow cultures.  Chest x-ray yesterday showing improving pulmonary edema and opacities. - VAP bundle - Continue wean off ventilator - Daily SBT - 12/1 BAL RLL > 10,000 colonies Staph aureus - 12/1 BAL LLL > 20,000 colonies Staph aureus - Continue vancomycin  and Zosyn , follow cultures - Continue Robitussin 75 mg 4 times daily - GI prophylaxis with Pepcid  20 mg daily -  Mucinex  600 BID - Follow ABG  Tobacco Use Disorder with 7 pack year  14 mg Nicotine  patch   Cardiac Alternating between Tachyarrhythmia/ bradycardia with prolonged QTC Heart failure with slightly reduced ejection fraction Echocardiogram showing left ventricular ejection fraction of 45 to 50% with normal right heart function.  Will compared to echo on 11/13/2024 ejection fraction is slightly decreased from 55-60%.  This is likely in the setting of tachyarrhythmia induced cardiomyopathy.  Patient tachycardic today.  Will continue monitor on telemetry.  On my exam patient is euvolemic today.  Net intake/output over the last 24 hours is +555 mm. - Continue monitor on telemetry - Will hold GDMT in the setting of critical illness -Monitor volume status - Once appropriate can start GDMT  Hypotension, resolved Off of Levophed now.  Maintaining maps.  GI EtOH Cirrhosis Prior Hep C positive 02/2024; RNA Quant negative  History of alcohol  abuse and previous DT  Patient doing well from cirrhosis standpoint.  He is now out of the withdrawal window.  Presumed last drink prior to hospitalization 11/19. MELD score 20; 19.6 % 3 month mortality  Sedation > Prop  - wean for RASS 0 to -1 - Thiamine , folic acid , MVI - Pepcid  20 BID  - Continue lactulose  20 mg 3 times daily  ID Multilobar pneumonia Chest x-ray yesterday showing improving opacities.  Patient currently on vancomycin  and Zosyn .  Blood cultures negative to date.  BAL growing Staphylococcus aureus.  Will continue to follow cultures and narrow as appropriate. - Continue Vanco and Zosyn  - Follow-up culture sensitivities  Endo > Hyperglycemia  Blood sugars are measuring well at this time with tube feeds. - Continue tube feed coverage with sliding scale  Renal Hypernatremia 2/2 decrease PO intake and hypovolemia   AKI 2/2 over diuresis; baseline 0.8, improving Sodium up to 150 today.  Creatinine is trending down to 1.38.  Slowly  improving.  Urine output has been steady. - With hypernatremia, reorder free water flushes - Can continue to monitor daily ins and outs - Avoid nephrotoxic agents - Avoid nephrotoxic agents such as IV contrast, NSAIDs, and phosphate containing bowel preps (FLEETS)  Heme Anemia of chronic disease  Macrocytic anemia 2/2 chronic Etoh use Hemoglobin 8.6 today.  No acute concern for bleeding at this time.  Will continue monitor. - Monitor  - Transfuse if Hgb < 7   Chronic thrombocytopenia, range ~20-50s  Liver synthetic dysfunction  Platelets remain stable at 34,000.  No acute concerns at this time.  MSK/Other Hx of L femur fx requiring L ORIF 12/2021 Hx of R fibula fx requiring R intramedullary nailing 12/2021  History of recurrent hemarthrosis of the L knee  -No acute concerns  Nutrition: Cor track today, TF DVT Prophylaxis: SCDs in place Bowel: Colace BID, PRN Miralax , lactulose    Labs   CBC: Recent Labs  Lab 11/14/24 0337 11/14/24 2009 11/15/24 0102 11/15/24 0111 11/16/24 0500 11/16/24 1120 11/16/24 1744 11/16/24 1852 11/17/24 0500 11/17/24 1206 11/18/24 0335  WBC 18.6*  --  14.3*  --  11.0*  --   --   --  9.0  --  9.8  HGB 8.3*   < > 9.4*   < > 9.4*   < > 16.3 9.9* 8.9* 10.9* 8.6*  HCT 25.8*   < > 30.0*   < > 28.4*   < > 48.0 29.0* 27.3* 32.0* 26.6*  MCV 111.2*  --  113.2*  --  110.5*  --   --   --  109.2*  --  110.4*  PLT 43*  --  37*  --  38*  --   --   --  34*  --  34*   < > = values in this interval not displayed.    Basic Metabolic Panel: Recent Labs  Lab 11/14/24 0337 11/14/24 2009 11/15/24 0102 11/15/24 0111 11/16/24 0500 11/16/24 1120 11/16/24 1447 11/16/24 1744 11/16/24 1852 11/16/24 2245 11/17/24 0500 11/17/24 1206 11/18/24 0335  NA 145   < > 152*   < > 152*   < > 144   < > 150* 148* 149* 150* 150*  K 3.1*   < > 3.2*   < > 3.2*   < > 3.9   < > 3.1* 3.6 3.0* 4.1 3.6  CL 109  --  113*  --  106  --  98  --   --  103 105  --  109  CO2 29  --   29  --  35*  --  35*  --   --  35* 36*  --  33*  GLUCOSE 185*  --  109*  --  116*  --  333*  --   --  141* 127*  --  137*  BUN 43*  --  40*  --  30*  --  31*  --   --  33* 31*  --  31*  CREATININE 1.40*  --  1.13  --  1.22  --  1.39*  --   --  1.62* 1.57*  --  1.38*  CALCIUM  8.3*  --  8.9  --  8.3*  --  7.9*  --   --  8.3* 8.6*  --  8.3*  MG 2.5*  --   --   --  1.7  --   --   --   --  2.4 2.8*  --  2.3  PHOS 3.2  --  2.0*  --  3.2  --   --   --   --   --  4.2  --  2.8   < > = values in this interval not displayed.   GFR: Estimated Creatinine Clearance: 52.9 mL/min (A) (by C-G formula based on SCr of 1.38 mg/dL (H)). Recent Labs  Lab 11/12/24 2206 11/13/24 0518 11/13/24 1002 11/14/24 0337 11/15/24 0102 11/16/24 0500 11/17/24 0500 11/18/24 0335  WBC  --    < >  --  18.6* 14.3* 11.0* 9.0 9.8  LATICACIDVEN 2.1*  --  2.1* 1.7  --   --   --   --    < > = values in this interval not displayed.    Liver Function Tests: Recent Labs  Lab 11/12/24 2124 11/14/24 0337 11/15/24 0102 11/16/24 0500 11/17/24 0500 11/18/24 0335  AST 99*  --   --   --   --   --   ALT 19  --   --   --   --   --   ALKPHOS 60  --   --   --   --   --   BILITOT 3.3*  --   --   --   --   --   PROT 4.7*  --   --   --   --   --   ALBUMIN  1.9* 1.9* 2.1* 2.1* 1.8* 1.7*   No results for input(s): LIPASE, AMYLASE in the last 168 hours. Recent Labs  Lab 11/13/24 0518 11/16/24 1056  AMMONIA 66* 34    ABG    Component Value Date/Time   PHART 7.489 (H) 11/17/2024 1206   PCO2ART 48.3 (H) 11/17/2024 1206   PO2ART 133 (H) 11/17/2024 1206   HCO3 36.7 (H) 11/17/2024 1206   TCO2 38 (H) 11/17/2024 1206   O2SAT 99 11/17/2024 1206     Coagulation Profile: Recent Labs  Lab 11/13/24 0518  INR 1.8*    Cardiac Enzymes: No results for input(s): CKTOTAL, CKMB, CKMBINDEX, TROPONINI in the last 168 hours.  HbA1C: Hgb A1c MFr Bld  Date/Time Value Ref Range Status  10/22/2024 11:54 PM 4.2 (L) 4.8 -  5.6 % Final    Comment:    (NOTE) Diagnosis of Diabetes The following HbA1c ranges recommended by the American Diabetes Association (ADA) may be used as an aid in the diagnosis of diabetes mellitus.  Hemoglobin             Suggested A1C NGSP%              Diagnosis  <5.7                   Non Diabetic  5.7-6.4                Pre-Diabetic  >6.4                   Diabetic  <  7.0                   Glycemic control for                       adults with diabetes.      CBG: Recent Labs  Lab 11/17/24 1111 11/17/24 1502 11/17/24 2013 11/17/24 2337 11/18/24 0330  GLUCAP 88 110* 120* 116* 128*   Libby Blanch, DO  Internal Medicine Resident PGY-3

## 2024-11-18 NOTE — Progress Notes (Signed)
   11/18/24 0758  Daily Weaning Assessment  Daily Assessment of Readiness to Wean Wean protocol criteria met (SBT performed)  SBT Method CPAP 5 cm H20 and PS 5 cm H20 (10/5)  Weaning Start Time 0800  Patient response Failed SBT terminated  Reason SBT Terminated RR > 35 breaths/min or Frequency/TV ratio >105 (only pulling 150-200 Vt)   RT placed pt on PS/CPAP (10/5). Pt became tachypenic and only pulling 150-200 Vt. RT placed pt back on full support with previous settings. RN and MD notified.

## 2024-11-19 ENCOUNTER — Inpatient Hospital Stay: Payer: MEDICAID | Admitting: Oncology

## 2024-11-19 ENCOUNTER — Inpatient Hospital Stay: Payer: MEDICAID

## 2024-11-19 LAB — CBC
HCT: 26 % — ABNORMAL LOW (ref 39.0–52.0)
Hemoglobin: 8.4 g/dL — ABNORMAL LOW (ref 13.0–17.0)
MCH: 35.7 pg — ABNORMAL HIGH (ref 26.0–34.0)
MCHC: 32.3 g/dL (ref 30.0–36.0)
MCV: 110.6 fL — ABNORMAL HIGH (ref 80.0–100.0)
Platelets: 36 K/uL — ABNORMAL LOW (ref 150–400)
RBC: 2.35 MIL/uL — ABNORMAL LOW (ref 4.22–5.81)
RDW: 14.1 % (ref 11.5–15.5)
WBC: 8.6 K/uL (ref 4.0–10.5)
nRBC: 0 % (ref 0.0–0.2)

## 2024-11-19 LAB — MAGNESIUM: Magnesium: 2.2 mg/dL (ref 1.7–2.4)

## 2024-11-19 LAB — BASIC METABOLIC PANEL WITH GFR
Anion gap: 4 — ABNORMAL LOW (ref 5–15)
BUN: 27 mg/dL — ABNORMAL HIGH (ref 6–20)
CO2: 31 mmol/L (ref 22–32)
Calcium: 8.2 mg/dL — ABNORMAL LOW (ref 8.9–10.3)
Chloride: 105 mmol/L (ref 98–111)
Creatinine, Ser: 1.07 mg/dL (ref 0.61–1.24)
GFR, Estimated: >60 mL/min (ref >60)
Glucose, Bld: 111 mg/dL — ABNORMAL HIGH (ref 70–99)
Potassium: 3.9 mmol/L (ref 3.5–5.1)
Sodium: 140 mmol/L (ref 135–145)

## 2024-11-19 LAB — RENAL FUNCTION PANEL
Albumin: 1.6 g/dL — ABNORMAL LOW (ref 3.5–5.0)
Anion gap: 5 (ref 5–15)
BUN: 26 mg/dL — ABNORMAL HIGH (ref 6–20)
CO2: 31 mmol/L (ref 22–32)
Calcium: 8.1 mg/dL — ABNORMAL LOW (ref 8.9–10.3)
Chloride: 111 mmol/L (ref 98–111)
Creatinine, Ser: 1.02 mg/dL (ref 0.61–1.24)
GFR, Estimated: >60 mL/min (ref >60)
Glucose, Bld: 123 mg/dL — ABNORMAL HIGH (ref 70–99)
Phosphorus: 3.9 mg/dL (ref 2.5–4.6)
Potassium: 3.8 mmol/L (ref 3.5–5.1)
Sodium: 147 mmol/L — ABNORMAL HIGH (ref 135–145)

## 2024-11-19 LAB — GLUCOSE, CAPILLARY
Glucose-Capillary: 112 mg/dL — ABNORMAL HIGH (ref 70–99)
Glucose-Capillary: 117 mg/dL — ABNORMAL HIGH (ref 70–99)
Glucose-Capillary: 123 mg/dL — ABNORMAL HIGH (ref 70–99)
Glucose-Capillary: 121 mg/dL — ABNORMAL HIGH (ref 70–99)
Glucose-Capillary: 118 mg/dL — ABNORMAL HIGH (ref 70–99)
Glucose-Capillary: 108 mg/dL — ABNORMAL HIGH (ref 70–99)

## 2024-11-19 MED ORDER — FUROSEMIDE 10 MG/ML IJ SOLN
20.0000 mg | Freq: Once | INTRAMUSCULAR | Status: AC
  Administered 2024-11-19: 20 mg via INTRAVENOUS
  Filled 2024-11-19: qty 2

## 2024-11-19 MED ORDER — INSULIN ASPART 100 UNIT/ML IJ SOLN
0.0000 [IU] | INTRAMUSCULAR | Status: DC
  Administered 2024-11-19 – 2024-11-24 (×13): 1 [IU] via SUBCUTANEOUS
  Administered 2024-11-25 (×2): 2 [IU] via SUBCUTANEOUS
  Administered 2024-11-25: 1 [IU] via SUBCUTANEOUS
  Filled 2024-11-19 (×8): qty 1
  Filled 2024-11-19: qty 2
  Filled 2024-11-19 (×3): qty 1
  Filled 2024-11-19: qty 3
  Filled 2024-11-19: qty 1

## 2024-11-19 MED ORDER — VANCOMYCIN HCL 1250 MG/250ML IV SOLN
1250.0000 mg | INTRAVENOUS | Status: DC
  Administered 2024-11-19 – 2024-11-23 (×5): 1250 mg via INTRAVENOUS
  Filled 2024-11-19 (×5): qty 250

## 2024-11-19 NOTE — Progress Notes (Signed)
 Pharmacy Antibiotic Note  Gregory Murphy is a 38 y.o. male transferred to [facility] on 11/12/2024 with PNa/ARDS and encephalopathy.  Patient has been on Zosyn .  Worsening tachypnea and CXR, so Pharmacy has been consulted to restart vancomycin . Culture now growing MRSA so patient continues on vancomycin  monotherapy.  SCr improving, now afebrile and WBC normalized.  Plan: Increase vanc to 1250mg  IV Q24H for AUC 545 using SCr 1.02 Monitor renal fxn, clinical progress, vanc level as indicated  Weight: 51.5 kg (113 lb 8.6 oz)  Temp (24hrs), Avg:99.7 F (37.6 C), Min:97.1 F (36.2 C), Max:101.9 F (38.8 C)  Recent Labs  Lab 11/12/24 2206 11/13/24 0518 11/13/24 1002 11/14/24 0337 11/15/24 0102 11/16/24 0500 11/16/24 1447 11/16/24 2245 11/17/24 0500 11/18/24 0335 11/19/24 0330  WBC  --  10.8*  --  18.6* 14.3* 11.0*  --   --  9.0 9.8 8.6  CREATININE  --  1.36*  --  1.40* 1.13 1.22 1.39* 1.62* 1.57* 1.38* 1.02  LATICACIDVEN 2.1*  --  2.1* 1.7  --   --   --   --   --   --   --   VANCORANDOM  --  18  --   --   --   --   --   --   --   --   --     Estimated Creatinine Clearance: 71.5 mL/min (by C-G formula based on SCr of 1.02 mg/dL).    Allergies  Allergen Reactions   Latex Itching    Gloves     Azith 11/27 >> 11/29 Zosyn  11/27 >> 12/2 Vanc x1 11/27 Zyvox 12/1 >>   11/27 VR = 18 mcg/mL   11/27 MRSA PCR - negative 11/27 BCx - negative 11/28 TA - negative  12/1 TA - MRSA 12/1 MRSA PCR - negative  [name] D. Lendell, PharmD, BCPS, BCCCP 11/19/2024, 6:56 AM

## 2024-11-19 NOTE — Progress Notes (Signed)
 Nutrition Follow-up  DOCUMENTATION CODES:  Non-severe (moderate) malnutrition in context of social or environmental circumstances  INTERVENTION:  Continue tube feeding via cortrak: Vital 1.5 at 50 ml/h (1200 ml per day) Prosource TF20 60 ml 1x/d Free water 100mL q2 hours per MD Provides 1880 kcal, 101 gm protein, 917 ml free water daily (TF+flush = free water) Continue MVI, thiamine , folic acid  daily  NUTRITION DIAGNOSIS:  Moderate Malnutrition related to social / environmental circumstances (alcohol  abuse) as evidenced by mild muscle depletion, mild fat depletion. - remains applicable  GOAL:  Patient will meet greater than or equal to 90% of their needs - progressing  MONITOR:  TF tolerance, I & O's, Vent status, Labs  REASON FOR ASSESSMENT:  Ventilator, Consult Enteral/tube feeding initiation and management  ASSESSMENT:  Pt with hx of alcohol  abuse, cirrhosis, methadone use for opioid dependence, and hx TBI presented to ED with respiratory failure, AMS, and possible methadone overdose.  11/20 - presented to [facility], intubated 11/25 - extubated 11/27 - reintubated for worsening withdrawal symptoms and transferred to [facility]  11/29 - self-extubated 12/1 - cortrak placed (gastric), reintubation and bronchoscopy 12/2 - TF restarted  Pt resting in bed at the time of assessment, no family at bedside this AM. Patient is currently intubated with TF infusing at goal via cortrak. Discussed with RN, no major changes today. Slightly more alert today, but still agitated. Noted some loose stools with FMS still in place but pt receiving lactulose . Will continue current TF regimen for now and monitor as needed for clinical changes.  MV: 11.4 L/min Temp (24hrs), Avg:99.3 F (37.4 C), Min:97.1 F (36.2 C), Max:101.9 F (38.8 C) MAP (art line): 85-55mmHg this AM  Propofol : 12.26 ml/hr (324 kcal/d)  Admit weight: 59.2 kg  Current weight: 51.5 kg    Intake/Output  Summary (Last 24 hours) at 11/19/2024 0839 Last data filed at 11/19/2024 9261 Gross per 24 hour  Intake 3113.4 ml  Output 815 ml  Net 2298.4 ml  Net IO Since Admission: -3,780.8 mL [11/19/24 0839]  Drains/Lines: PICC triple lumen Art Line Cortrak (gastric) UOP 615 mL x 24 hours FMS 275mL recorded yesterday  Nutritionally Relevant Medications: Scheduled Meds:  cholecalciferol  1,000 Units Per Tube Daily   famotidine   20 mg Per Tube Daily   PROSource TF20  60 mL Per Tube Daily   folic acid   1 mg Per Tube Daily   free water  100 mL Per Tube Q2H   insulin  aspart  0-15 Units Subcutaneous Q4H   lactulose   10 g Per Tube BID   multivitamin with minerals  1 tablet Per Tube Daily   thiamine   100 mg Per Tube Daily   Continuous Infusions:  feeding supplement (VITAL 1.5 CAL) 50 mL/hr at 11/19/24 0700   propofol  (DIPRIVAN ) infusion 40 mcg/kg/min (11/19/24 0700)   vancomycin      PRN Meds: polyethylene glycol  Labs Reviewed: Sodium 147 BUN 26 CBG ranges from 110-142 mg/dL over the last 24 hours HgbA1c 4.2% (11/6)  NUTRITION - FOCUSED PHYSICAL EXAM: Flowsheet Row Most Recent Value  Orbital Region Mild depletion  Upper Arm Region Moderate depletion  Thoracic and Lumbar Region Mild depletion  Buccal Region Mild depletion  Temple Region Mild depletion  Clavicle Bone Region No depletion  Clavicle and Acromion Bone Region Mild depletion  Scapular Bone Region No depletion  Dorsal Hand Unable to assess  Patellar Region Mild depletion  Anterior Thigh Region Mild depletion  Posterior Calf Region Moderate depletion  Edema (RD Assessment) Moderate  [bilateral forearms, feet]  Hair Reviewed  Eyes Reviewed  Mouth Reviewed  Skin Reviewed  Nails Unable to assess  [mittens]    Diet Order:   Diet Order             Diet NPO time specified Except for: Other (See Comments)  Diet effective now                   EDUCATION NEEDS:  Not appropriate for education at this time  Skin:   Skin Assessment: Reviewed RN Assessment  Last BM:  11/30 - type 7 FMS (pouch) in place since  11/29  Height:  Ht Readings from Last 1 Encounters:  10/25/24 5' 1 (1.549 m)    Weight:  Wt Readings from Last 1 Encounters:  11/17/24 51.5 kg    Ideal Body Weight:  50.9 kg  BMI:  Body mass index is 21.45 kg/m.  Estimated Nutritional Needs:  Kcal:  1800-2000 kcal/d Protein:  90-105g/d Fluid:  2L/d    Vernell Lukes, RD, LDN, CNSC Registered Dietitian II Please reach out via secure chat

## 2024-11-19 NOTE — Progress Notes (Addendum)
 NAME:  Gregory Murphy, MRN:  [id number], DOB:  1986/08/09, LOS: 7 ADMISSION DATE:  11/12/2024, CONSULTATION DATE:  11/27 REFERRING MD:  [facility] Provider, CHIEF COMPLAINT:  ARDS    History of Present Illness:  Patient is a 38 yo M w/ pertinent PMH alcohol  abuse, alcoholic liver cirrhosis, chronic pancytopenia presents to [facility] on 11/27 with ards.   Patient has prior traumatic injury to left femur requiring rod placement from left hip to knee.  Has had prior left knee effusion requiring arthrocentesis back in July 2025.  Patient recently admitted on 11/7-11/14 for left knee effusion requiring arthrocentesis.  While in hospital patient became encephalopathic likely alcohol  withdrawal requiring brief Precedex , Librium  taper, CIWA protocol.   Patient recently started on methadone 3 days prior to this admit.  Patient admitted to [facility] on 11/20 with AMS presumed 2/2 to polysubstance abuse and possible withdrawal. Intbubated for airway protection.  Patient required intubation.  CT chest with b/l peripheral and perivascular groundglass . Ct head no acute abnormality. Patient was extubated on 11/25. While in hospital worsening withdrawal despite ativan  and phenobarb, started on precedex . Developed respiratory failure and reintubated on 11/27.  Patient with increasing O2 requirements and ARDS.    On 11/27 transferred to [facility] for fever, resp failure, possible intentional methadone OD, PNA, AKI, and encephalopathy due to HE and DT.   Pertinent  Medical History   Past Medical History:  Diagnosis Date   Alcohol  withdrawal (HCC) 03/01/2024   Alcoholic cirrhosis of liver with ascites (HCC) 03/02/2024   Alcoholic gastritis 03/03/2024   Alcoholic hepatitis with ascites (HCC) 03/02/2024   Brain injury (HCC)    Cirrhosis with alcoholism (HCC)    Critical polytrauma 12/17/2021   Decompensated cirrhosis (HCC) 03/10/2024   Depression    Elevated AFP 03/03/2024   HCV antibody positive  03/03/2024   Insomnia    Macrocytic anemia 03/03/2024   Malnutrition of moderate degree 12/21/2021   Panic attack    Severe thrombocytopenia 03/03/2024   Splenic rupture 09/07/2023     Significant Hospital Events: Including procedures, antibiotic start and stop dates in addition to other pertinent events   11/27 Admitted to ICU, re intubated, started on Zithromax  and Zosyn   11/28 Stop stress dose steroids, decrease RR > 18; weaned off fent and versed   11/29 self extubated, placed on HHFNC  12/1 Reintubated d/t increased tachypnea and hypoxia; Precedex  stopped > prop  12/3 MRSA > zosyn  stopped   Interim History / Subjective:  O/N: None   Patient is evaluated at bedside, intubated on ventilator support. Awake, not alert, not following commands   Objective    Blood pressure 126/60, pulse 86, temperature 98.4 F (36.9 C), temperature source Axillary, resp. rate (!) 24, weight 51.5 kg, SpO2 100%.    Vent Mode: PRVC FiO2 (%):  [40 %] 40 % Set Rate:  [14 bmp] 14 bmp Vt Set:  [420 mL] 420 mL PEEP:  [5 cmH20] 5 cmH20 Plateau Pressure:  [18 cmH20-21 cmH20] 21 cmH20   Intake/Output Summary (Last 24 hours) at 11/19/2024 0808 Last data filed at 11/19/2024 9261 Gross per 24 hour  Intake 3213.4 ml  Output 890 ml  Net 2323.4 ml   Filed Weights   11/15/24 0500 11/16/24 0500 11/17/24 0500  Weight: 54.1 kg 51.1 kg 51.5 kg   Examination:  General: Critically ill, intubated, awake, but not following commands  HENT: ET tube in place, atraumatic, normocephalic; pupils (dilated), reactive to light. Lungs: Clear, Tachypneic  Cardiovascular: RR,  no murmurs Abdomen: soft, ND, + BS  Extremities: Warm to touch, +DP pulses b/l.   Neuro: Awake, not alert, not following commands.  GU: foley draining well   Labs: RFP: Sodium 147 (150), potassium 3.8, BUN 26 (31), creatinine 1.02 (1.38), albumin  1.6 CBC: WBC 8.6, hemoglobin 8.4, platelets 36 (34) Magnesium  2.2  UOP 615 mL, net +2.0  L  Resolved problem list  Lactic acidosis - resolved   Assessment and Plan   Neuro Acute metabolic encephalopathy > waxing and waning Delirium  Acute hepatic encephalopathy  Hyperammoniemia 34 <66 <40  Opioid dependence on Methadone Awake, not alert, not following commands.  Will plan on weaning down sedation this morning. - Continue lactulose  20 mg X 3 times daily with goal bowel movements of 3 bowel movements/day - Delirium precautions   Respiratory Acute respiratory failure requiring intubation 11/20 > then extubated 11/25;  re-intubated 11/27 d/t hypoxemia > self extubated 11/29; re-intubated 12/1  MRSA PNA noted on BAL 12/1 ARDS 2/2 to chronic alcohol  use and Multilobar PNA initially > resolved ARDS > now MRSA pneumonia Pulmonary edema  LTVV, 4-8cc/kg IBW with goal Pplat<30 and DP<15  - Continue wean off ventilator - Daily SBT/WUA - Ventilator associated pneumonia prevention > propofol , maintain RASS 0 to -1 - PAD >Propofol , PRN Ativan   - Continue Vancomycin  12/2 -  - Continue Robitussin 75 mg 4 times daily - GI prophylaxis with Pepcid  20 mg daily - Mucinex  600 BID  Tobacco Use Disorder with 7 pack year  14 mg Nicotine  patch   Cardiac HFrEF with EF 45-50%  Episode of Alternating between Tachyarrhythmia/ bradycardia with prolonged QTC on 12/02  12/2 ECHO > EF 45-50% with mildly decreased LV function, normal RV function; compared to echo 11/28 with LVEF 55 to 60%, normal LV function. UOP 615, net +2.2 L EKG this AM showed PR 122, QTC 490; NSR  - IV Lasix  20 mg today - Continue monitor on telemetry - Strict I's and O's - Once appropriate can start GDMT  Hypotension, resolved Off of Levophed now.  Maintaining maps.  GI EtOH Cirrhosis Prior Hep C positive 02/2024; RNA Quant negative  History of alcohol  abuse and previous DT  Patient doing well from cirrhosis standpoint.  He is now out of the withdrawal window.  Presumed last drink prior to hospitalization  11/19. MELD score 20; 19.6 % 3 month mortality  Sedation > Prop  - wean for RASS 0 to -1 - Thiamine , folic acid , MVI - Pepcid  20 BID  - Continue lactulose  20 mg 3 times daily  ID MRSA PNA based on BAL 12/01  - Continue Vancomycin    Endo > Hyperglycemia  Blood sugars are measuring well at this time with tube feeds. - Continue tube feed coverage with sliding scale - CBG 140 - 180 SSI  Renal Hypernatremia 2/2 decrease PO intake and hypovolemia   AKI 2/2 over diuresis; baseline 0.8, > resolved - Na 147 (150); serum creatinine 1.02 (1.38) - Free water flushes 100 mL every 2 hours - Monitor Daily I/Os, Daily weight  - Maintain MAP>65 for optimal renal perfusion.  - Avoid nephrotoxic agents such as IV contrast, NSAIDs, and phosphate containing bowel preps (FLEETS)  Heme Anemia of chronic disease  Macrocytic anemia 2/2 chronic Etoh use Hemoglobin 8.4 (8.6).  No acute concern for bleeding at this time.  Will continue monitor. - Monitor CBC - Transfuse if Hgb < 7   Chronic thrombocytopenia, range ~20-50s  Liver synthetic dysfunction  Platelets 36 (34), no  acute signs of bleed. - Transfuse if actively bleeding  MSK/Other Hx of L femur fx requiring L ORIF 12/2021 Hx of R fibula fx requiring R intramedullary nailing 12/2021  History of recurrent hemarthrosis of the L knee  -No acute concerns  Nutrition: Cor track today, TF DVT Prophylaxis: SCDs in place  Bowel: Colace BID, PRN Miralax , lactulose , LBM 11/18/2024   Labs   CBC: Recent Labs  Lab 11/15/24 0102 11/15/24 0111 11/16/24 0500 11/16/24 1120 11/16/24 1852 11/17/24 0500 11/17/24 1206 11/18/24 0335 11/19/24 0330  WBC 14.3*  --  11.0*  --   --  9.0  --  9.8 8.6  HGB 9.4*   < > 9.4*   < > 9.9* 8.9* 10.9* 8.6* 8.4*  HCT 30.0*   < > 28.4*   < > 29.0* 27.3* 32.0* 26.6* 26.0*  MCV 113.2*  --  110.5*  --   --  109.2*  --  110.4* 110.6*  PLT 37*  --  38*  --   --  34*  --  34* 36*   < > = values in this interval not  displayed.    Basic Metabolic Panel: Recent Labs  Lab 11/15/24 0102 11/15/24 0111 11/16/24 0500 11/16/24 1120 11/16/24 1447 11/16/24 1744 11/16/24 2245 11/17/24 0500 11/17/24 1206 11/18/24 0335 11/19/24 0330  NA 152*   < > 152*   < > 144   < > 148* 149* 150* 150* 147*  K 3.2*   < > 3.2*   < > 3.9   < > 3.6 3.0* 4.1 3.6 3.8  CL 113*  --  106  --  98  --  103 105  --  109 111  CO2 29  --  35*  --  35*  --  35* 36*  --  33* 31  GLUCOSE 109*  --  116*  --  333*  --  141* 127*  --  137* 123*  BUN 40*  --  30*  --  31*  --  33* 31*  --  31* 26*  CREATININE 1.13  --  1.22  --  1.39*  --  1.62* 1.57*  --  1.38* 1.02  CALCIUM  8.9  --  8.3*  --  7.9*  --  8.3* 8.6*  --  8.3* 8.1*  MG  --   --  1.7  --   --   --  2.4 2.8*  --  2.3 2.2  PHOS 2.0*  --  3.2  --   --   --   --  4.2  --  2.8 3.9   < > = values in this interval not displayed.   GFR: Estimated Creatinine Clearance: 71.5 mL/min (by C-G formula based on SCr of 1.02 mg/dL). Recent Labs  Lab 11/12/24 2206 11/13/24 0518 11/13/24 1002 11/14/24 0337 11/15/24 0102 11/16/24 0500 11/17/24 0500 11/18/24 0335 11/19/24 0330  WBC  --    < >  --  18.6*   < > 11.0* 9.0 9.8 8.6  LATICACIDVEN 2.1*  --  2.1* 1.7  --   --   --   --   --    < > = values in this interval not displayed.    Liver Function Tests: Recent Labs  Lab 11/12/24 2124 11/14/24 0337 11/15/24 0102 11/16/24 0500 11/17/24 0500 11/18/24 0335 11/19/24 0330  AST 99*  --   --   --   --   --   --   ALT 19  --   --   --   --   --   --  ALKPHOS 60  --   --   --   --   --   --   BILITOT 3.3*  --   --   --   --   --   --   PROT 4.7*  --   --   --   --   --   --   ALBUMIN  1.9*   < > 2.1* 2.1* 1.8* 1.7* 1.6*   < > = values in this interval not displayed.   No results for input(s): LIPASE, AMYLASE in the last 168 hours. Recent Labs  Lab 11/13/24 0518 11/16/24 1056  AMMONIA 66* 34    ABG    Component Value Date/Time   PHART 7.489 (H) 11/17/2024 1206    PCO2ART 48.3 (H) 11/17/2024 1206   PO2ART 133 (H) 11/17/2024 1206   HCO3 36.7 (H) 11/17/2024 1206   TCO2 38 (H) 11/17/2024 1206   O2SAT 99 11/17/2024 1206     Coagulation Profile: Recent Labs  Lab 11/13/24 0518  INR 1.8*    Cardiac Enzymes: No results for input(s): CKTOTAL, CKMB, CKMBINDEX, TROPONINI in the last 168 hours.  HbA1C: Hgb A1c MFr Bld  Date/Time Value Ref Range Status  10/22/2024 11:54 PM 4.2 (L) 4.8 - 5.6 % Final    Comment:    (NOTE) Diagnosis of Diabetes The following HbA1c ranges recommended by the American Diabetes Association (ADA) may be used as an aid in the diagnosis of diabetes mellitus.  Hemoglobin             Suggested A1C NGSP%              Diagnosis  <5.7                   Non Diabetic  5.7-6.4                Pre-Diabetic  >6.4                   Diabetic  <7.0                   Glycemic control for                       adults with diabetes.      CBG: Recent Labs  Lab 11/18/24 1535 11/18/24 1954 11/18/24 2337 11/19/24 0340 11/19/24 0724  GLUCAP 142* 118* 110* 112* 117*

## 2024-11-20 LAB — CBC
HCT: 26 % — ABNORMAL LOW (ref 39.0–52.0)
Hemoglobin: 8.5 g/dL — ABNORMAL LOW (ref 13.0–17.0)
MCH: 34.8 pg — ABNORMAL HIGH (ref 26.0–34.0)
MCHC: 32.7 g/dL (ref 30.0–36.0)
MCV: 106.6 fL — ABNORMAL HIGH (ref 80.0–100.0)
Platelets: 41 K/uL — ABNORMAL LOW (ref 150–400)
RBC: 2.44 MIL/uL — ABNORMAL LOW (ref 4.22–5.81)
RDW: 13.7 % (ref 11.5–15.5)
WBC: 9.3 K/uL (ref 4.0–10.5)
nRBC: 0 % (ref 0.0–0.2)

## 2024-11-20 LAB — GLUCOSE, CAPILLARY
Glucose-Capillary: 110 mg/dL — ABNORMAL HIGH (ref 70–99)
Glucose-Capillary: 116 mg/dL — ABNORMAL HIGH (ref 70–99)
Glucose-Capillary: 109 mg/dL — ABNORMAL HIGH (ref 70–99)
Glucose-Capillary: 117 mg/dL — ABNORMAL HIGH (ref 70–99)
Glucose-Capillary: 115 mg/dL — ABNORMAL HIGH (ref 70–99)
Glucose-Capillary: 92 mg/dL (ref 70–99)

## 2024-11-20 LAB — BASIC METABOLIC PANEL WITH GFR
Anion gap: 8 (ref 5–15)
BUN: 27 mg/dL — ABNORMAL HIGH (ref 6–20)
CO2: 30 mmol/L (ref 22–32)
Calcium: 8.2 mg/dL — ABNORMAL LOW (ref 8.9–10.3)
Chloride: 101 mmol/L (ref 98–111)
Creatinine, Ser: 1.08 mg/dL (ref 0.61–1.24)
GFR, Estimated: >60 mL/min (ref >60)
Glucose, Bld: 124 mg/dL — ABNORMAL HIGH (ref 70–99)
Potassium: 3.8 mmol/L (ref 3.5–5.1)
Sodium: 139 mmol/L (ref 135–145)

## 2024-11-20 LAB — POCT I-STAT 7, (LYTES, BLD GAS, ICA,H+H)
Acid-Base Excess: 10 mmol/L — ABNORMAL HIGH (ref 0.0–2.0)
Bicarbonate: 34.3 mmol/L — ABNORMAL HIGH (ref 20.0–28.0)
Calcium, Ion: 1.23 mmol/L (ref 1.15–1.40)
HCT: 35 % — ABNORMAL LOW (ref 39.0–52.0)
Hemoglobin: 11.9 g/dL — ABNORMAL LOW (ref 13.0–17.0)
O2 Saturation: 98 %
Patient temperature: 37.8
Potassium: 4.2 mmol/L (ref 3.5–5.1)
Sodium: 152 mmol/L — ABNORMAL HIGH (ref 135–145)
TCO2: 36 mmol/L — ABNORMAL HIGH (ref 22–32)
pCO2 arterial: 45 mmHg (ref 32–48)
pH, Arterial: 7.493 — ABNORMAL HIGH (ref 7.35–7.45)
pO2, Arterial: 107 mmHg (ref 83–108)

## 2024-11-20 LAB — TRIGLYCERIDES: Triglycerides: 116 mg/dL (ref <150)

## 2024-11-20 LAB — MAGNESIUM: Magnesium: 2.1 mg/dL (ref 1.7–2.4)

## 2024-11-20 MED ORDER — FENTANYL CITRATE (PF) 50 MCG/ML IJ SOSY: 25.0000 ug | PREFILLED_SYRINGE | INTRAMUSCULAR | Status: DC | PRN

## 2024-11-20 MED ORDER — FUROSEMIDE 10 MG/ML IJ SOLN
40.0000 mg | Freq: Once | INTRAMUSCULAR | Status: AC
  Administered 2024-11-20: 40 mg via INTRAVENOUS
  Filled 2024-11-20: qty 4

## 2024-11-20 MED ORDER — FENTANYL 2500MCG IN NS 250ML (10MCG/ML) PREMIX INFUSION
0.0000 ug/h | INTRAVENOUS | Status: DC
  Administered 2024-11-20: 50 ug/h via INTRAVENOUS
  Filled 2024-11-20: qty 250

## 2024-11-20 MED ORDER — FAMOTIDINE 20 MG PO TABS
20.0000 mg | ORAL_TABLET | Freq: Two times a day (BID) | ORAL | Status: DC
  Administered 2024-11-20 – 2024-11-23 (×7): 20 mg
  Filled 2024-11-20 (×7): qty 1

## 2024-11-20 NOTE — Progress Notes (Signed)
 NAME:  Gregory Murphy, MRN:  [id number], DOB:  [birth year], LOS: 8 ADMISSION DATE:  11/12/2024, CONSULTATION DATE:  11/27 REFERRING MD:  [facility] Provider, CHIEF COMPLAINT:  ARDS    History of Present Illness:  Patient is a 38 yo M w/ pertinent PMH alcohol  abuse, alcoholic liver cirrhosis, chronic pancytopenia presents to [facility] on 11/27 with ards.   Patient has prior traumatic injury to left femur requiring rod placement from left hip to knee.  Has had prior left knee effusion requiring arthrocentesis back in July 2025.  Patient recently admitted on 11/7-11/14 for left knee effusion requiring arthrocentesis.  While in hospital patient became encephalopathic likely alcohol  withdrawal requiring brief Precedex , Librium  taper, CIWA protocol.   Patient recently started on methadone 3 days prior to this admit.  Patient admitted to [facility] on 11/20 with AMS presumed 2/2 to polysubstance abuse and possible withdrawal. Intbubated for airway protection.  Patient required intubation.  CT chest with b/l peripheral and perivascular groundglass . Ct head no acute abnormality. Patient was extubated on 11/25. While in hospital worsening withdrawal despite ativan  and phenobarb, started on precedex . Developed respiratory failure and reintubated on 11/27.  Patient with increasing O2 requirements and ARDS.    On 11/27 transferred to [facility] for fever, resp failure, possible intentional methadone OD, PNA, AKI, and encephalopathy due to HE and DT.   Pertinent  Medical History   Past Medical History:  Diagnosis Date   Alcohol  withdrawal (HCC) 03/01/2024   Alcoholic cirrhosis of liver with ascites (HCC) 03/02/2024   Alcoholic gastritis 03/03/2024   Alcoholic hepatitis with ascites (HCC) 03/02/2024   Brain injury (HCC)    Cirrhosis with alcoholism (HCC)    Critical polytrauma 12/17/2021   Decompensated cirrhosis (HCC) 03/10/2024   Depression    Elevated AFP 03/03/2024   HCV antibody positive  03/03/2024   Insomnia    Macrocytic anemia 03/03/2024   Malnutrition of moderate degree 12/21/2021   Panic attack    Severe thrombocytopenia 03/03/2024   Splenic rupture 09/07/2023     Significant Hospital Events: Including procedures, antibiotic start and stop dates in addition to other pertinent events   11/27 Admitted to ICU, re intubated, started on Zithromax  and Zosyn   11/28 Stop stress dose steroids, decrease RR > 18; weaned off fent and versed   11/29 self extubated, placed on HHFNC  12/1 Reintubated d/t increased tachypnea and hypoxia; Precedex  stopped > prop  12/3 MRSA > zosyn  stopped  12/4 Lasix  given  12/5 Stop prop, low dose fent. Failed SBT trial   Interim History / Subjective:  O/N: Fever 100.5   Awake, alert, following commands, intubated and ventilator support.  Objective    Blood pressure 126/60, pulse 100, temperature 100 F (37.8 C), temperature source Axillary, resp. rate (!) 28, weight 51.5 kg, SpO2 100%.    Vent Mode: CPAP;PSV FiO2 (%):  [40 %] 40 % Set Rate:  [14 bmp] 14 bmp Vt Set:  [420 mL] 420 mL PEEP:  [5 cmH20] 5 cmH20 Pressure Support:  [10 cmH20] 10 cmH20 Plateau Pressure:  [18 cmH20-27 cmH20] 18 cmH20   Intake/Output Summary (Last 24 hours) at 11/20/2024 0817 Last data filed at 11/20/2024 0700 Gross per 24 hour  Intake 2786.38 ml  Output 2950 ml  Net -163.62 ml   Filed Weights   11/15/24 0500 11/16/24 0500 11/17/24 0500  Weight: 54.1 kg 51.1 kg 51.5 kg   Examination:  General: Critically ill, intubated, awake, following commands HENT: [place]/AT. Cor track in place; ETT  with frothy secretions Lungs: rhonchi noted with decreased breath sounds LLL  Cardiovascular: RR, no murmurs Abdomen: mildly tender epigastric region, +BS  Extremities: Warm to touch, +DP pulses b/l.   Neuro: Awake, alert, following commands  GU: foley draining well   Labs: NA 139, K3.8, BUN 27, creatinine 1.08, magnesium  2.1 WBC 9.3, hemoglobin 8.5, platelet 41  (36) Triglycerides 116  UOP 2.8 L, net even   Resolved problem list  Lactic acidosis - resolved   Assessment and Plan   Neuro Acute metabolic encephalopathy > waxing and waning Delirium  Acute hepatic encephalopathy  Hyperammoniemia 34 <66 <40  Opioid dependence on Methadone Awake, alert, following commands. - Continue lactulose  10 mg X 2 times daily with goal bowel movements of 3 bowel movements/day - Delirium precautions   Respiratory Acute respiratory failure requiring intubation 11/20 > then extubated 11/25;  re-intubated 11/27 d/t hypoxemia > self extubated 11/29; re-intubated 12/1  ARDS 2/2 to chronic alcohol  use and Multilobar PNA initially > resolved ARDS > now MRSA pneumonia Pulmonary edema  Patient is currently placed on pressure support, 10/5 > Tachypneic RR 70 ; with frothy secretions from the ETT and rhonchi on exam - F/u ABG and consider Trach if patient continues to fail SBT  - LTVV, 4-8cc/kg IBW with goal Pplat<30 and DP<15  - Continue wean off ventilator - Daily SBT/WUA - Ventilator associated pneumonia prevention > propofol , maintain RASS 0 to -1 - PAD >Stopped Prop; low dose Fent  - Continue Vancomycin  12/1 -  - Continue Robitussin 75 mg 4 times daily - GI prophylaxis with Pepcid  20 mg daily - Mucinex  600 BID  Tobacco Use Disorder with 7 pack year  14 mg Nicotine  patch   Cardiac HFrEF with EF 45-50%  Episode of Alternating between Tachyarrhythmia/ bradycardia with prolonged QTC on 12/02  12/2 ECHO > EF 45-50% with mildly decreased LV function, normal RV function; compared to echo 11/28 with LVEF 55 to 60%, normal LV function. UOP 2.8 L, net even  - IV lasix  40 today  - Continue monitor on telemetry - Strict I's and O's - Once appropriate can start GDMT  Hypotension, resolved Off of Levophed  now.  Maintaining maps.  GI EtOH Cirrhosis Prior Hep C positive 02/2024; RNA Quant negative  History of alcohol  abuse and previous DT  Patient doing well  from cirrhosis standpoint.  He is now out of the withdrawal window.  Presumed last drink prior to hospitalization 11/19. MELD score 20; 19.6 % 3 month mortality  Sedation > Prop  - wean for RASS 0 to -1 - Thiamine , folic acid , MVI - Pepcid  20 BID  - Continue lactulose  10 BID   ID MRSA PNA based on BAL 12/01  - Continue Vancomycin    Endo > Hyperglycemia  Blood sugars are measuring well at this time with tube feeds. - Continue tube feed coverage with sliding scale - CBG 140 - 180 SSI  Renal Hypernatremia 2/2 decrease PO intake and hypovolemia  > resolved  AKI 2/2 over diuresis; baseline 0.8, > resolved - Na 139, Scr 1.08  - Free water  flushes 100 mL every 2 hours - Monitor Daily I/Os, Daily weight  - Maintain MAP>65 for optimal renal perfusion.  - Avoid nephrotoxic agents such as IV contrast, NSAIDs, and phosphate containing bowel preps (FLEETS)  Heme Anemia of chronic disease  Macrocytic anemia 2/2 chronic Etoh use Hemoglobin 8.4 (8.6).  No acute concern for bleeding at this time.  Will continue monitor. - Monitor CBC - Transfuse if  Hgb < 7   Chronic thrombocytopenia, range ~20-50s  Liver synthetic dysfunction  Platelets 41 (36), no acute signs of bleed. - Transfuse if actively bleeding  MSK/Other Hx of L femur fx requiring L ORIF 12/2021 Hx of R fibula fx requiring R intramedullary nailing 12/2021  History of recurrent hemarthrosis of the L knee  -No acute concerns  Nutrition: Cor track today, TF DVT Prophylaxis: SCDs in place; no pharmacological prophylaxis d/t PLT < 50  Bowel: Colace BID, PRN Miralax , lactulose , LBM 11/18/2024   Labs   CBC: Recent Labs  Lab 11/16/24 0500 11/16/24 1120 11/17/24 0500 11/17/24 1206 11/18/24 0335 11/19/24 0330 11/20/24 0427  WBC 11.0*  --  9.0  --  9.8 8.6 9.3  HGB 9.4*   < > 8.9* 10.9* 8.6* 8.4* 8.5*  HCT 28.4*   < > 27.3* 32.0* 26.6* 26.0* 26.0*  MCV 110.5*  --  109.2*  --  110.4* 110.6* 106.6*  PLT 38*  --  34*  --  34*  36* 41*   < > = values in this interval not displayed.    Basic Metabolic Panel: Recent Labs  Lab 11/15/24 0102 11/15/24 0111 11/16/24 0500 11/16/24 1120 11/16/24 2245 11/17/24 0500 11/17/24 1206 11/18/24 0335 11/19/24 0330 11/19/24 1349 11/20/24 0427  NA 152*   < > 152*   < > 148* 149* 150* 150* 147* 140 139  K 3.2*   < > 3.2*   < > 3.6 3.0* 4.1 3.6 3.8 3.9 3.8  CL 113*  --  106   < > 103 105  --  109 111 105 101  CO2 29  --  35*   < > 35* 36*  --  33* 31 31 30   GLUCOSE 109*  --  116*   < > 141* 127*  --  137* 123* 111* 124*  BUN 40*  --  30*   < > 33* 31*  --  31* 26* 27* 27*  CREATININE 1.13  --  1.22   < > 1.62* 1.57*  --  1.38* 1.02 1.07 1.08  CALCIUM  8.9  --  8.3*   < > 8.3* 8.6*  --  8.3* 8.1* 8.2* 8.2*  MG  --   --  1.7  --  2.4 2.8*  --  2.3 2.2  --  2.1  PHOS 2.0*  --  3.2  --   --  4.2  --  2.8 3.9  --   --    < > = values in this interval not displayed.   GFR: Estimated Creatinine Clearance: 67.6 mL/min (by C-G formula based on SCr of 1.08 mg/dL). Recent Labs  Lab 11/13/24 1002 11/14/24 0337 11/15/24 0102 11/17/24 0500 11/18/24 0335 11/19/24 0330 11/20/24 0427  WBC  --  18.6*   < > 9.0 9.8 8.6 9.3  LATICACIDVEN 2.1* 1.7  --   --   --   --   --    < > = values in this interval not displayed.    Liver Function Tests: Recent Labs  Lab 11/15/24 0102 11/16/24 0500 11/17/24 0500 11/18/24 0335 11/19/24 0330  ALBUMIN  2.1* 2.1* 1.8* 1.7* 1.6*   No results for input(s): LIPASE, AMYLASE in the last 168 hours. Recent Labs  Lab 11/16/24 1056  AMMONIA 34    ABG    Component Value Date/Time   PHART 7.489 (H) 11/17/2024 1206   PCO2ART 48.3 (H) 11/17/2024 1206   PO2ART 133 (H) 11/17/2024 1206   HCO3 36.7 (H)  11/17/2024 1206   TCO2 38 (H) 11/17/2024 1206   O2SAT 99 11/17/2024 1206     Coagulation Profile: No results for input(s): INR, PROTIME in the last 168 hours.   Cardiac Enzymes: No results for input(s): CKTOTAL, CKMB,  CKMBINDEX, TROPONINI in the last 168 hours.  HbA1C: Hgb A1c MFr Bld  Date/Time Value Ref Range Status  10/22/2024 11:54 PM 4.2 (L) 4.8 - 5.6 % Final    Comment:    (NOTE) Diagnosis of Diabetes The following HbA1c ranges recommended by the American Diabetes Association (ADA) may be used as an aid in the diagnosis of diabetes mellitus.  Hemoglobin             Suggested A1C NGSP%              Diagnosis  <5.7                   Non Diabetic  5.7-6.4                Pre-Diabetic  >6.4                   Diabetic  <7.0                   Glycemic control for                       adults with diabetes.      CBG: Recent Labs  Lab 11/19/24 1522 11/19/24 1923 11/19/24 2312 11/20/24 0311 11/20/24 0755  GLUCAP 121* 118* 108* 110* 116*

## 2024-11-21 LAB — CBC
HCT: 25 % — ABNORMAL LOW (ref 39.0–52.0)
Hemoglobin: 8.2 g/dL — ABNORMAL LOW (ref 13.0–17.0)
MCH: 35.5 pg — ABNORMAL HIGH (ref 26.0–34.0)
MCHC: 32.8 g/dL (ref 30.0–36.0)
MCV: 108.2 fL — ABNORMAL HIGH (ref 80.0–100.0)
Platelets: 49 K/uL — ABNORMAL LOW (ref 150–400)
RBC: 2.31 MIL/uL — ABNORMAL LOW (ref 4.22–5.81)
RDW: 13.8 % (ref 11.5–15.5)
WBC: 9.2 K/uL (ref 4.0–10.5)
nRBC: 0 % (ref 0.0–0.2)

## 2024-11-21 LAB — GLUCOSE, CAPILLARY
Glucose-Capillary: 109 mg/dL — ABNORMAL HIGH (ref 70–99)
Glucose-Capillary: 131 mg/dL — ABNORMAL HIGH (ref 70–99)
Glucose-Capillary: 130 mg/dL — ABNORMAL HIGH (ref 70–99)
Glucose-Capillary: 114 mg/dL — ABNORMAL HIGH (ref 70–99)
Glucose-Capillary: 72 mg/dL (ref 70–99)
Glucose-Capillary: 121 mg/dL — ABNORMAL HIGH (ref 70–99)

## 2024-11-21 LAB — BASIC METABOLIC PANEL WITH GFR
Anion gap: 5 (ref 5–15)
BUN: 28 mg/dL — ABNORMAL HIGH (ref 6–20)
CO2: 29 mmol/L (ref 22–32)
Calcium: 7.9 mg/dL — ABNORMAL LOW (ref 8.9–10.3)
Chloride: 101 mmol/L (ref 98–111)
Creatinine, Ser: 0.87 mg/dL (ref 0.61–1.24)
GFR, Estimated: >60 mL/min (ref >60)
Glucose, Bld: 122 mg/dL — ABNORMAL HIGH (ref 70–99)
Potassium: 4 mmol/L (ref 3.5–5.1)
Sodium: 135 mmol/L (ref 135–145)

## 2024-11-21 LAB — MAGNESIUM: Magnesium: 2.1 mg/dL (ref 1.7–2.4)

## 2024-11-21 MED ORDER — FREE WATER
100.0000 mL | Status: DC
  Administered 2024-11-21 – 2024-11-23 (×12): 100 mL

## 2024-11-21 MED ORDER — FUROSEMIDE 10 MG/ML IJ SOLN
20.0000 mg | Freq: Once | INTRAMUSCULAR | Status: AC
  Administered 2024-11-21: 20 mg via INTRAVENOUS
  Filled 2024-11-21: qty 2

## 2024-11-21 MED ORDER — LACTULOSE 10 GM/15ML PO SOLN
10.0000 g | Freq: Three times a day (TID) | ORAL | Status: DC
  Administered 2024-11-21 – 2024-11-22 (×5): 10 g
  Filled 2024-11-21 (×6): qty 15

## 2024-11-21 NOTE — Progress Notes (Signed)
 NAME:  Gregory Murphy, MRN:  [id number], DOB:  [birth year], LOS: 9 ADMISSION DATE:  11/12/2024, CONSULTATION DATE:  11/27 REFERRING MD:  [facility] Provider, CHIEF COMPLAINT:  ARDS    History of Present Illness:  Patient is a 38 yo M w/ pertinent PMH alcohol  abuse, alcoholic liver cirrhosis, chronic pancytopenia presents to [facility] on 11/27 with ards.   Patient has prior traumatic injury to left femur requiring rod placement from left hip to knee.  Has had prior left knee effusion requiring arthrocentesis back in July 2025.  Patient recently admitted on 11/7-11/14 for left knee effusion requiring arthrocentesis.  While in hospital patient became encephalopathic likely alcohol  withdrawal requiring brief Precedex , Librium  taper, CIWA protocol.   Patient recently started on methadone 3 days prior to this admit.  Patient admitted to [facility] on 11/20 with AMS presumed 2/2 to polysubstance abuse and possible withdrawal. Intbubated for airway protection.  Patient required intubation.  CT chest with b/l peripheral and perivascular groundglass . Ct head no acute abnormality. Patient was extubated on 11/25. While in hospital worsening withdrawal despite ativan  and phenobarb, started on precedex . Developed respiratory failure and reintubated on 11/27.  Patient with increasing O2 requirements and ARDS.    On 11/27 transferred to [facility] for fever, resp failure, possible intentional methadone OD, PNA, AKI, and encephalopathy due to HE and DT.   Pertinent  Medical History   Past Medical History:  Diagnosis Date   Alcohol  withdrawal (HCC) 03/01/2024   Alcoholic cirrhosis of liver with ascites (HCC) 03/02/2024   Alcoholic gastritis 03/03/2024   Alcoholic hepatitis with ascites (HCC) 03/02/2024   Brain injury (HCC)    Cirrhosis with alcoholism (HCC)    Critical polytrauma 12/17/2021   Decompensated cirrhosis (HCC) 03/10/2024   Depression    Elevated AFP 03/03/2024   HCV antibody positive  03/03/2024   Insomnia    Macrocytic anemia 03/03/2024   Malnutrition of moderate degree 12/21/2021   Panic attack    Severe thrombocytopenia 03/03/2024   Splenic rupture 09/07/2023     Significant Hospital Events: Including procedures, antibiotic start and stop dates in addition to other pertinent events   11/27 Admitted to ICU, re intubated, started on Zithromax  and Zosyn   11/28 Stop stress dose steroids, decrease RR > 18; weaned off fent and versed   11/29 self extubated, placed on HHFNC  12/1 Reintubated d/t increased tachypnea and hypoxia; Precedex  stopped > prop  12/3 MRSA > zosyn  stopped  12/4 Lasix  given  12/5 Stop prop, low dose fent. Failed SBT trial d/t RR 70s  12/6 Wean down fent, awake and following commands.   Interim History / Subjective:  O/N: Afebrile  Awake, alert, following commands. Able to life his head off the bed, move his extremities.   Objective    Blood pressure 103/64, pulse 93, temperature 97.9 F (36.6 C), temperature source Axillary, resp. rate 14, weight 51.5 kg, SpO2 100%.    Vent Mode: PRVC FiO2 (%):  [40 %] 40 % Set Rate:  [14 bmp] 14 bmp Vt Set:  [420 mL] 420 mL PEEP:  [5 cmH20] 5 cmH20 Pressure Support:  [10 cmH20] 10 cmH20 Plateau Pressure:  [13 cmH20-18 cmH20] 14 cmH20   Intake/Output Summary (Last 24 hours) at 11/21/2024 0617 Last data filed at 11/21/2024 0600 Gross per 24 hour  Intake 2701.74 ml  Output 3250 ml  Net -548.26 ml   Filed Weights   11/15/24 0500 11/16/24 0500 11/17/24 0500  Weight: 54.1 kg 51.1 kg 51.5 kg  Examination:  General: Critically ill, intubated, awake, HENT: [place]/AT. Cor track in place; ETT with mild secretions Lungs: Coarse on MV, no crackles  Cardiovascular: RR, no murmurs Abdomen: soft, mildly tender  Extremities: Warm to touch, +DP pulses b/l.   Neuro: Awake, alert, following commands. Able to life his head off the bed, move his extremities.  GU: foley draining well   Labs: Sodium 135, K4.0, BUN  28, creatinine 0.87 WBC 9.2, Hgb 8.2, PLT 49 (41)  Magnesium  2.1 CBG 109  UOP 2.5 L, net +204 mL  Resolved problem list  Lactic acidosis - resolved   Assessment and Plan   Neuro Acute metabolic encephalopathy > waxing and waning Delirium  Acute hepatic encephalopathy  Hyperammoniemia 34 <66 <40  Opioid dependence on Methadone Awake, alert, following commands. Able to lift his head off the bed, about to squeeze my fingers and wiggle his toes  - Continue lactulose  10 mg X 2 times daily with goal bowel movements of 3 bowel movements/day - Delirium precautions   Respiratory Acute respiratory failure requiring intubation 11/20 > then extubated 11/25;  re-intubated 11/27 d/t hypoxemia > self extubated 11/29; re-intubated 12/1  ARDS 2/2 to chronic alcohol  use and Multilobar PNA initially > resolved ARDS > now MRSA pneumonia Pulmonary edema  SBT trial today > if successful > can extubate today  - LTVV, 4-8cc/kg IBW with goal Pplat<30 and DP<15  - Continue wean off ventilator - Daily SBT/WUA - Ventilator associated pneumonia prevention > propofol , maintain RASS 0 to -1 - PAD > wean down propofol  and fentanyl  - Continue Vancomycin  12/1 -  - Continue Robitussin 75 mg 4 times daily - GI prophylaxis with Pepcid  20 mg daily - Mucinex  600 BID  Tobacco Use Disorder with 7 pack year  14 mg Nicotine  patch   Cardiac HFrEF with EF 45-50%  Episode of Alternating between Tachyarrhythmia/ bradycardia with prolonged QTC on 12/02  12/2 ECHO > EF 45-50% with mildly decreased LV function, normal RV function; compared to echo 11/28 with LVEF 55 to 60%, normal LV function. UOP 2.5 L, net +204 mL - IV lasix  20 today  - Continue monitor on telemetry - Strict I's and O's - Once appropriate can start GDMT  Hypotension, resolved Off of Levophed  now.  Maintaining maps.  GI EtOH Cirrhosis Prior Hep C positive 02/2024; RNA Quant negative  History of alcohol  abuse and previous DT  Patient doing well  from cirrhosis standpoint.  He is now out of the withdrawal window.  Presumed last drink prior to hospitalization 11/19. MELD score 20; 19.6 % 3 month mortality  Sedation > propofol  and fentanyl , wean down - wean for RASS 0 to -1 - Thiamine , folic acid , MVI - Pepcid  20 BID  - Continue lactulose  10 BID   ID MRSA PNA based on BAL 12/01  - Continue Vancomycin    Endo > Hyperglycemia  Blood sugars are measuring well at this time with tube feeds. - Continue tube feed coverage with sliding scale - CBG 140 - 180 SSI  Renal Hypernatremia 2/2 decrease PO intake and hypovolemia  > resolved  AKI 2/2 over diuresis; baseline 0.8, > resolved - NA 135, creatinine 0.87 - Free water  flushes 100 mL every 2 hours - Monitor Daily I/Os, Daily weight  - Maintain MAP>65 for optimal renal perfusion.  - Avoid nephrotoxic agents such as IV contrast, NSAIDs, and phosphate containing bowel preps (FLEETS)  Heme Anemia of chronic disease  Macrocytic anemia 2/2 chronic Etoh use Hemoglobin 8.4 (8.6).  No  acute concern for bleeding at this time.  Will continue monitor. - Monitor CBC - Transfuse if Hgb < 7   Chronic thrombocytopenia, range ~20-50s  Liver synthetic dysfunction  Platelets 41 (36), no acute signs of bleed. - Transfuse if actively bleeding  MSK/Other Hx of L femur fx requiring L ORIF 12/2021 Hx of R fibula fx requiring R intramedullary nailing 12/2021  History of recurrent hemarthrosis of the L knee  -No acute concerns  Nutrition: Cor track today, TF DVT Prophylaxis: SCDs in place; no pharmacological prophylaxis d/t PLT < 50  Bowel: Colace BID, PRN Miralax , lactulose , LBM 11/18/2024   Labs   CBC: Recent Labs  Lab 11/16/24 0500 11/16/24 1120 11/17/24 0500 11/17/24 1206 11/18/24 0335 11/19/24 0330 11/20/24 0427 11/20/24 1108  WBC 11.0*  --  9.0  --  9.8 8.6 9.3  --   HGB 9.4*   < > 8.9* 10.9* 8.6* 8.4* 8.5* 11.9*  HCT 28.4*   < > 27.3* 32.0* 26.6* 26.0* 26.0* 35.0*  MCV 110.5*   --  109.2*  --  110.4* 110.6* 106.6*  --   PLT 38*  --  34*  --  34* 36* 41*  --    < > = values in this interval not displayed.    Basic Metabolic Panel: Recent Labs  Lab 11/15/24 0102 11/15/24 0111 11/16/24 0500 11/16/24 1120 11/17/24 0500 11/17/24 1206 11/18/24 0335 11/19/24 0330 11/19/24 1349 11/20/24 0427 11/20/24 1108 11/21/24 0430  NA 152*   < > 152*   < > 149*   < > 150* 147* 140 139 152* 135  K 3.2*   < > 3.2*   < > 3.0*   < > 3.6 3.8 3.9 3.8 4.2 4.0  CL 113*  --  106   < > 105  --  109 111 105 101  --  101  CO2 29  --  35*   < > 36*  --  33* 31 31 30   --  29  GLUCOSE 109*  --  116*   < > 127*  --  137* 123* 111* 124*  --  122*  BUN 40*  --  30*   < > 31*  --  31* 26* 27* 27*  --  28*  CREATININE 1.13  --  1.22   < > 1.57*  --  1.38* 1.02 1.07 1.08  --  0.87  CALCIUM  8.9  --  8.3*   < > 8.6*  --  8.3* 8.1* 8.2* 8.2*  --  7.9*  MG  --   --  1.7   < > 2.8*  --  2.3 2.2  --  2.1  --  2.1  PHOS 2.0*  --  3.2  --  4.2  --  2.8 3.9  --   --   --   --    < > = values in this interval not displayed.   GFR: Estimated Creatinine Clearance: 83.9 mL/min (by C-G formula based on SCr of 0.87 mg/dL). Recent Labs  Lab 11/17/24 0500 11/18/24 0335 11/19/24 0330 11/20/24 0427  WBC 9.0 9.8 8.6 9.3    Liver Function Tests: Recent Labs  Lab 11/15/24 0102 11/16/24 0500 11/17/24 0500 11/18/24 0335 11/19/24 0330  ALBUMIN  2.1* 2.1* 1.8* 1.7* 1.6*   No results for input(s): LIPASE, AMYLASE in the last 168 hours. Recent Labs  Lab 11/16/24 1056  AMMONIA 34    ABG    Component Value Date/Time   PHART 7.493 (H)  11/20/2024 1108   PCO2ART 45.0 11/20/2024 1108   PO2ART 107 11/20/2024 1108   HCO3 34.3 (H) 11/20/2024 1108   TCO2 36 (H) 11/20/2024 1108   O2SAT 98 11/20/2024 1108     Coagulation Profile: No results for input(s): INR, PROTIME in the last 168 hours.   Cardiac Enzymes: No results for input(s): CKTOTAL, CKMB, CKMBINDEX, TROPONINI in the last  168 hours.  HbA1C: Hgb A1c MFr Bld  Date/Time Value Ref Range Status  10/22/2024 11:54 PM 4.2 (L) 4.8 - 5.6 % Final    Comment:    (NOTE) Diagnosis of Diabetes The following HbA1c ranges recommended by the American Diabetes Association (ADA) may be used as an aid in the diagnosis of diabetes mellitus.  Hemoglobin             Suggested A1C NGSP%              Diagnosis  <5.7                   Non Diabetic  5.7-6.4                Pre-Diabetic  >6.4                   Diabetic  <7.0                   Glycemic control for                       adults with diabetes.      CBG: Recent Labs  Lab 11/20/24 1213 11/20/24 1522 11/20/24 2029 11/20/24 2319 11/21/24 0423  GLUCAP 109* 117* 115* 92 109*

## 2024-11-21 NOTE — Progress Notes (Signed)
 Patient extubated per written order with RT and RN at bedside. Cuff leak present prior to extubating, no stridor noted post. Patient placed on 4L [place] and tolerating well at this time.

## 2024-11-22 ENCOUNTER — Inpatient Hospital Stay (HOSPITAL_COMMUNITY): Payer: MEDICAID

## 2024-11-22 ENCOUNTER — Encounter (HOSPITAL_COMMUNITY): Payer: Self-pay | Admitting: Pulmonary Disease

## 2024-11-22 ENCOUNTER — Other Ambulatory Visit: Payer: Self-pay

## 2024-11-22 LAB — MAGNESIUM: Magnesium: 2.1 mg/dL (ref 1.7–2.4)

## 2024-11-22 LAB — BASIC METABOLIC PANEL WITH GFR
Anion gap: 10 (ref 5–15)
BUN: 25 mg/dL — ABNORMAL HIGH (ref 6–20)
CO2: 29 mmol/L (ref 22–32)
Calcium: 8.6 mg/dL — ABNORMAL LOW (ref 8.9–10.3)
Chloride: 98 mmol/L (ref 98–111)
Creatinine, Ser: 0.97 mg/dL (ref 0.61–1.24)
GFR, Estimated: >60 mL/min (ref >60)
Glucose, Bld: 128 mg/dL — ABNORMAL HIGH (ref 70–99)
Potassium: 4.3 mmol/L (ref 3.5–5.1)
Sodium: 137 mmol/L (ref 135–145)

## 2024-11-22 LAB — GLUCOSE, CAPILLARY
Glucose-Capillary: 112 mg/dL — ABNORMAL HIGH (ref 70–99)
Glucose-Capillary: 121 mg/dL — ABNORMAL HIGH (ref 70–99)
Glucose-Capillary: 129 mg/dL — ABNORMAL HIGH (ref 70–99)
Glucose-Capillary: 130 mg/dL — ABNORMAL HIGH (ref 70–99)
Glucose-Capillary: 134 mg/dL — ABNORMAL HIGH (ref 70–99)
Glucose-Capillary: 147 mg/dL — ABNORMAL HIGH (ref 70–99)

## 2024-11-22 LAB — CBC
HCT: 24.3 % — ABNORMAL LOW (ref 39.0–52.0)
Hemoglobin: 8.3 g/dL — ABNORMAL LOW (ref 13.0–17.0)
MCH: 36.1 pg — ABNORMAL HIGH (ref 26.0–34.0)
MCHC: 34.2 g/dL (ref 30.0–36.0)
MCV: 105.7 fL — ABNORMAL HIGH (ref 80.0–100.0)
Platelets: 56 K/uL — ABNORMAL LOW (ref 150–400)
RBC: 2.3 MIL/uL — ABNORMAL LOW (ref 4.22–5.81)
RDW: 13.6 % (ref 11.5–15.5)
WBC: 12.4 K/uL — ABNORMAL HIGH (ref 4.0–10.5)
nRBC: 0 % (ref 0.0–0.2)

## 2024-11-22 LAB — VANCOMYCIN, PEAK: Vancomycin Pk: 41 ug/mL — ABNORMAL HIGH (ref 30–40)

## 2024-11-22 MED ORDER — FLUTICASONE PROPIONATE 50 MCG/ACT NA SUSP
2.0000 | Freq: Every day | NASAL | Status: DC
  Administered 2024-11-22 – 2024-11-27 (×5): 2 via NASAL
  Filled 2024-11-22: qty 16

## 2024-11-22 MED ORDER — PNEUMOCOCCAL 20-VAL CONJ VACC 0.5 ML IM SUSY
0.5000 mL | PREFILLED_SYRINGE | INTRAMUSCULAR | Status: DC
  Filled 2024-11-22: qty 0.5

## 2024-11-22 MED ORDER — FUROSEMIDE 10 MG/ML IJ SOLN
20.0000 mg | Freq: Once | INTRAMUSCULAR | Status: AC
  Administered 2024-11-22: 20 mg via INTRAVENOUS
  Filled 2024-11-22: qty 2

## 2024-11-22 MED ORDER — OXYMETAZOLINE HCL 0.05 % NA SOLN
1.0000 | Freq: Two times a day (BID) | NASAL | Status: AC
  Administered 2024-11-22 – 2024-11-24 (×5): 1 via NASAL
  Filled 2024-11-22: qty 30

## 2024-11-22 MED ORDER — INFLUENZA VIRUS VACC SPLIT PF (FLUZONE) 0.5 ML IM SUSY
0.5000 mL | PREFILLED_SYRINGE | INTRAMUSCULAR | Status: AC
  Administered 2024-11-23: 0.5 mL via INTRAMUSCULAR
  Filled 2024-11-22: qty 0.5

## 2024-11-22 NOTE — Evaluation (Signed)
 Physical Therapy Evaluation Patient Details Name: Gregory Murphy MRN: [id number] DOB: March 31, 1986 Today's Date: 11/22/2024  History of Present Illness  The pt is a 38 yo male presenting 11/27 as a transfer from [facility] (admitted there 11/20) due to fever, resp failure, possible intentional methadone OD, PNA, AKI, and encephalopathy due to HE and DT. Extubated 12/6. PMH includes: alcohol  abuse, alcoholic liver cirrhosis, chronic pancytopenia, BLE trauma in Jan 2023 with ORIF L femur 1/4, IM nail tibia bilateral 1/1.   Clinical Impression  Pt in bed upon arrival of PT, agreeable to evaluation at this time. Prior to admission the pt was independent with all mobility, reports no falls, and living alone. However, no family was present to confirm and information given this afternoon differed from prior information in chart in regards to stairs to enter and support available. The pt required min-modA to complete bed mobility and initial sit-stand. He was limited to ~10 sec standing trial and needed assist to prevent posterior LOB and braced on bed with BLE. Pt unable to initiate stepping or gait at this time, will continue to benefit from skilled PT acutely and intensive post-acute rehab to facilitate return to prior level of independence.     If plan is discharge home, recommend the following: Two people to help with walking and/or transfers;A lot of help with bathing/dressing/bathroom;Assistance with cooking/housework;Direct supervision/assist for medications management;Direct supervision/assist for financial management;Assist for transportation;Help with stairs or ramp for entrance;Supervision due to cognitive status   Can travel by private vehicle        Equipment Recommendations  (defer until gait training completed)  Recommendations for Other Services  Rehab consult;OT consult    Functional Status Assessment Patient has had a recent decline in their functional status and  demonstrates the ability to make significant improvements in function in a reasonable and predictable amount of time.     Precautions / Restrictions Precautions Precautions: Fall Recall of Precautions/Restrictions: Impaired Precaution/Restrictions Comments: cortrak Restrictions Weight Bearing Restrictions Per Provider Order: No      Mobility  Bed Mobility Overal bed mobility: Needs Assistance Bed Mobility: Supine to Sit, Sit to Supine     Supine to sit: Min assist Sit to supine: Min assist   General bed mobility comments: pt impulsively returning to sidelying and sitting back up x3 at end of session, able to communicate fatigue only after first return to sidelying, needs assist for LE movement and repositioning    Transfers Overall transfer level: Needs assistance Equipment used: 1 person hand held assist Transfers: Sit to/from Stand Sit to Stand: Mod assist           General transfer comment: modA from EOB with limited standing tolerance ~10 seconds. VSS on RA    Ambulation/Gait               General Gait Details: pt unable to step on initial stand and declined subsequent stands this session     Balance Overall balance assessment: Needs assistance Sitting-balance support: Single extremity supported, Feet supported Sitting balance-Leahy Scale: Fair Sitting balance - Comments: can static sit without UE support, single UE support with reachign or leaning   Standing balance support: Single extremity supported, During functional activity Standing balance-Leahy Scale: Poor Standing balance comment: dependent on UE support and leaning backwards against bed                             Pertinent Vitals/Pain Pain Assessment Pain  Assessment: Faces Faces Pain Scale: Hurts even more Pain Location: chest Pain Descriptors / Indicators: Discomfort, Sharp Pain Intervention(s): Monitored during session, Limited activity within patient's tolerance,  Repositioned    Home Living Family/patient expects to be discharged to:: Private residence Living Arrangements: Alone Available Help at Discharge: Family;Friend(s);Available 24 hours/day Type of Home: House Home Access: Ramped entrance       Home Layout: One level Home Equipment: Agricultural Consultant (2 wheels) Additional Comments: per RN report pt lives with other relatives, pt reports to this PT he lives alone. per prior chart pt has 5 steps to enter and told this PT today he has a ramp. no family present to confirm    Prior Function Prior Level of Function : Independent/Modified Independent;Driving             Mobility Comments: Independent with all mobility needs ADLs Comments: Independent with all ADLs     Extremity/Trunk Assessment   Upper Extremity Assessment Upper Extremity Assessment: Defer to OT evaluation;Generalized weakness    Lower Extremity Assessment Lower Extremity Assessment: LLE deficits/detail;Generalized weakness LLE Deficits / Details: pt with slightly decreased ROM and strength in LLE compared to RLE in bed-level testing and at EOB. no buckling in stance. limited standing tolerance. LLE Sensation: WNL LLE Coordination: WNL    Cervical / Trunk Assessment Cervical / Trunk Assessment: Normal  Communication   Communication Communication: Impaired Factors Affecting Communication: Hearing impaired    Cognition Arousal: Alert Behavior During Therapy: Flat affect, WFL for tasks assessed/performed   PT - Cognitive impairments: No family/caregiver present to determine baseline, Orientation, Memory, Attention, Sequencing, Problem solving, Safety/Judgement   Orientation impairments: Time                   PT - Cognition Comments: pt needing cues for date and then unable to recall at end of session. needed increased ceus forsequencing movement and line managemet. Following commands: Impaired Following commands impaired: Follows one step commands  with increased time     Cueing Cueing Techniques: Verbal cues     General Comments General comments (skin integrity, edema, etc.): VSS on RA when sitting EOB, >93%. returned to 2L at end of session wtih SpO2 to 97%    Exercises     Assessment/Plan    PT Assessment Patient needs continued PT services  PT Problem List Decreased activity tolerance;Decreased balance;Decreased mobility;Decreased cognition;Decreased safety awareness       PT Treatment Interventions DME instruction;Gait training;Stair training;Functional mobility training;Therapeutic activities;Therapeutic exercise;Balance training;Patient/family education    PT Goals (Current goals can be found in the Care Plan section)  Acute Rehab PT Goals Patient Stated Goal: none stated this session PT Goal Formulation: Patient unable to participate in goal setting Time For Goal Achievement: 12/06/24 Potential to Achieve Goals: Good    Frequency Min 2X/week        AM-PAC PT 6 Clicks Mobility  Outcome Measure Help needed turning from your back to your side while in a flat bed without using bedrails?: A Little Help needed moving from lying on your back to sitting on the side of a flat bed without using bedrails?: A Lot Help needed moving to and from a bed to a chair (including a wheelchair)?: A Lot Help needed standing up from a chair using your arms (e.g., wheelchair or bedside chair)?: A Lot Help needed to walk in hospital room?: Total Help needed climbing 3-5 steps with a railing? : Total 6 Click Score: 11    End of Session  Equipment Utilized During Treatment: Gait belt;Oxygen  Activity Tolerance: Patient tolerated treatment well;Patient limited by fatigue Patient left: in bed;with call bell/phone within reach;with bed alarm set Nurse Communication: Mobility status PT Visit Diagnosis: Unsteadiness on feet (R26.81);Muscle weakness (generalized) (M62.81);Pain Pain - part of body:  (back)    Time: 8363-8341 PT Time  Calculation (min) (ACUTE ONLY): 22 min   Charges:   PT Evaluation $PT Eval Moderate Complexity: 1 Mod   PT General Charges $$ ACUTE PT VISIT: 1 Visit         Izetta Call, PT, DPT   Acute Rehabilitation Department Office [phone] Secure Chat Communication Preferred  Izetta JULIANNA Call 11/22/2024, 6:17 PM

## 2024-11-22 NOTE — Plan of Care (Signed)
   Problem: Health Behavior/Discharge Planning: Goal: Ability to manage health-related needs will improve Outcome: Progressing

## 2024-11-22 NOTE — Evaluation (Signed)
 Clinical/Bedside Swallow Evaluation Patient Details  Name: Gregory Murphy MRN: [id number] Date of Birth: 08-Jan-1986  Today's Date: 11/22/2024 Time: SLP Start Time (ACUTE ONLY): 9082 SLP Stop Time (ACUTE ONLY): 9062 SLP Time Calculation (min) (ACUTE ONLY): 20 min  Past Medical History:  Past Medical History:  Diagnosis Date   Alcohol  withdrawal (HCC) 03/01/2024   Alcoholic cirrhosis of liver with ascites (HCC) 03/02/2024   Alcoholic gastritis 03/03/2024   Alcoholic hepatitis with ascites (HCC) 03/02/2024   Brain injury (HCC)    Cirrhosis with alcoholism (HCC)    Critical polytrauma 12/17/2021   Decompensated cirrhosis (HCC) 03/10/2024   Depression    Elevated AFP 03/03/2024   HCV antibody positive 03/03/2024   Insomnia    Macrocytic anemia 03/03/2024   Malnutrition of moderate degree 12/21/2021   Panic attack    Severe thrombocytopenia 03/03/2024   Splenic rupture 09/07/2023   Past Surgical History:  Past Surgical History:  Procedure Laterality Date   ABDOMINAL SURGERY     CLOSED REDUCTION FINGER WITH PERCUTANEOUS PINNING Left 02/04/2024   Procedure: LEFT THUMB CLOSED REDUCTION FINGER WITH PERCUTANEOUS PINNing;  Surgeon: Arlinda Buster, MD;  Location: [facility];  Service: Orthopedics;  Laterality: Left;   DISTAL FEMUR BONE BRIDGE EXCISION Left    FEMUR CLOSED REDUCTION Left 12/17/2021   Procedure: CLOSED REDUCTION FEMORAL SHAFT;  Surgeon: Barton Drape, MD;  Location: [facility];  Service: Orthopedics;  Laterality: Left;   FOREARM SURGERY Left    x4-metal rods placed   I & D EXTREMITY Left 02/04/2024   Procedure: IRRIGATION AND DEBRIDEMENT OF LEFT THUMB;  Surgeon: Arlinda Buster, MD;  Location: [facility];  Service: Orthopedics;  Laterality: Left;   IR PARACENTESIS  03/02/2024   LEG SURGERY Right 2023   NAILBED REPAIR Left 02/04/2024   Procedure: LEFT THUMB NAILBED REPAIR;  Surgeon: Arlinda Buster, MD;  Location: [facility];  Service: Orthopedics;  Laterality: Left;   ORIF FEMUR FRACTURE Left 12/20/2021   Procedure: OPEN REDUCTION INTERNAL FIXATION (ORIF) DISTAL FEMUR FRACTURE;  Surgeon: Kendal Franky SQUIBB, MD;  Location: [facility];  Service: Orthopedics;  Laterality: Left;   TIBIA IM NAIL INSERTION Bilateral 12/17/2021   Procedure: RIGHT INTRAMEDULLARY (IM) NAIL TIBIAL; RIGHT CLOSED TREATMENT OF FIBULA FRACTURE; LEFT CLOSED REDUCTION SPLINTING OF PERIPROSTHETIC  SUPRACONDYLAR FEMUR FRACTURE;  Surgeon: Barton Drape, MD;  Location: [facility];  Service: Orthopedics;  Laterality: Bilateral;   WRIST ARTHROSCOPY WITH CARPOMETACARPEL [facility]) ARTHROPLASTY Left 03/21/2022   Procedure: left wrist arthroscopy with debridement as needed;  Surgeon: Sissy Cough, MD;  Location: [facility];  Service: Orthopedics;  Laterality: Left;  just needs 60 mins for surgery   WRIST SURGERY Right    pins   HPI:  Gregory Murphy is a 57 yom who presented to [facility] 11/20 with AMS presumed 2/2 to polysubstance abuse and possible withdrawal. Intubated for airway protection 11/20-11/25; reintubated 11/27 and transferred to [facility]; self-extubated 11/29. Reintubation 12/1-12/6. Dx ARDS multilobar pna, HFrEF, cirrhosis (out of withdrawal window 12/7); cortrak 12/4. PMHx alcohol  abuse, alcoholic liver cirrhosis, chronic pancytopenia, opioid dependence on Methadone, hx L femur fx ORIF 12/2021, R fibula fx intramed nailing 12/2021.    Assessment / Plan / Recommendation  Clinical Impression  Pt presents with concerns for a post-extubation dysphagia with heightened risk of silent aspiration.  He was orally intubated for a total of 12 days over three intubations, one self-extubation.  He is intermittently aphonic; volume is low.  Followed simple commands  but poor awareness of situation.  Trials of ice chips, thin liquids revealed adequate attention and oral phase; airway protection is questionable. Recommend proceeding with FEES in the  next 24 hours; continue NPO; allow ice chips after oral care. D/W RN.   SLP Visit Diagnosis: Dysphagia, pharyngeal phase (R13.13);Dysphagia, unspecified (R13.10)           Diet Recommendation   NPO  Medication Administration: Via alternative means    Other Recommendations Oral Care Recommendations: Oral care prior to ice chip/H20                  Swallow Study   General Date of Onset: 11/05/24 HPI: Gregory Murphy is a 9 yom who presented to [facility] 11/20 with AMS presumed 2/2 to polysubstance abuse and possible withdrawal. Intubated for airway protection 11/20-11/25; reintubated 11/27 and transferred to [facility]; self-extubated 11/29. Reintubation 12/1-12/6. Dx ARDS multilobar pna, HFrEF, cirrhosis (out of withdrawal window 12/7); cortrak 12/4. PMHx alcohol  abuse, alcoholic liver cirrhosis, chronic pancytopenia, opioid dependence on Methadone, hx L femur fx ORIF 12/2021, R fibula fx intramed nailing 12/2021. Type of Study: Bedside Swallow Evaluation Previous Swallow Assessment: no Diet Prior to this Study: NPO;Cortrak/Small bore NG tube Temperature Spikes Noted: Yes Respiratory Status: Nasal cannula History of Recent Intubation: Yes Total duration of intubation (days): 12 days (over three intubations) Date extubated: 11/21/24 Behavior/Cognition: Alert;Confused Oral Cavity Assessment: Within Functional Limits Oral Care Completed by SLP: Recent completion by staff Oral Cavity - Dentition: Edentulous (one lower tooth) Self-Feeding Abilities: Needs assist Patient Positioning: Upright in bed Baseline Vocal Quality: Low vocal intensity Volitional Cough: Weak;Congested;Wet Volitional Swallow: Unable to elicit    Oral/Motor/Sensory Function Overall Oral Motor/Sensory Function: Within functional limits   Ice Chips Ice chips: Within functional limits   Thin Liquid Thin Liquid: Impaired Pharyngeal  Phase Impairments: Cough - Delayed    Nectar Thick Nectar Thick Liquid: Not  tested   Honey Thick Honey Thick Liquid: Not tested   Puree Puree: Not tested   Solid     Solid: Not tested      Gregory Murphy Laurice 11/22/2024,9:51 AM  Murphy L. Vona, MA CCC/SLP Clinical Specialist - Acute Care SLP Acute Rehabilitation Services Office number [phone]

## 2024-11-22 NOTE — Progress Notes (Signed)
 NAME:  Gregory Murphy, MRN:  [id number], DOB:  18-Mar-1986, LOS: 10 ADMISSION DATE:  11/12/2024, CONSULTATION DATE:  11/27 REFERRING MD:  [facility] Provider, CHIEF COMPLAINT:  ARDS    History of Present Illness:  Patient is a 38 yo M w/ pertinent PMH alcohol  abuse, alcoholic liver cirrhosis, chronic pancytopenia presents to [facility] on 11/27 with ards.   Patient has prior traumatic injury to left femur requiring rod placement from left hip to knee.  Has had prior left knee effusion requiring arthrocentesis back in July 2025.  Patient recently admitted on 11/7-11/14 for left knee effusion requiring arthrocentesis.  While in hospital patient became encephalopathic likely alcohol  withdrawal requiring brief Precedex , Librium  taper, CIWA protocol.   Patient recently started on methadone 3 days prior to this admit.  Patient admitted to [facility] on 11/20 with AMS presumed 2/2 to polysubstance abuse and possible withdrawal. Intbubated for airway protection.  Patient required intubation.  CT chest with b/l peripheral and perivascular groundglass . Ct head no acute abnormality. Patient was extubated on 11/25. While in hospital worsening withdrawal despite ativan  and phenobarb, started on precedex . Developed respiratory failure and reintubated on 11/27.  Patient with increasing O2 requirements and ARDS.    On 11/27 transferred to [facility] for fever, resp failure, possible intentional methadone OD, PNA, AKI, and encephalopathy due to HE and DT.   Pertinent  Medical History   Past Medical History:  Diagnosis Date   Alcohol  withdrawal (HCC) 03/01/2024   Alcoholic cirrhosis of liver with ascites (HCC) 03/02/2024   Alcoholic gastritis 03/03/2024   Alcoholic hepatitis with ascites (HCC) 03/02/2024   Brain injury (HCC)    Cirrhosis with alcoholism (HCC)    Critical polytrauma 12/17/2021   Decompensated cirrhosis (HCC) 03/10/2024   Depression    Elevated AFP 03/03/2024   HCV antibody positive  03/03/2024   Insomnia    Macrocytic anemia 03/03/2024   Malnutrition of moderate degree 12/21/2021   Panic attack    Severe thrombocytopenia 03/03/2024   Splenic rupture 09/07/2023     Significant Hospital Events: Including procedures, antibiotic start and stop dates in addition to other pertinent events   11/27 Admitted to ICU, re intubated, started on Zithromax  and Zosyn   11/28 Stop stress dose steroids, decrease RR > 18; weaned off fent and versed   11/29 self extubated, placed on [facility]  12/1 Reintubated d/t increased tachypnea and hypoxia; Precedex  stopped > prop  12/3 MRSA > zosyn  stopped  12/4 Lasix  given  12/5 Stop prop, low dose fent. Failed SBT trial d/t RR 70s  12/6 Wean down fent, awake and following commands.   Interim History / Subjective:  O/N: Afebrile  Awake, alert, following commands.  Reports decreased hearing   Objective    Blood pressure 125/82, pulse 83, temperature 98.2 F (36.8 C), temperature source Oral, resp. rate 17, height 5' 1 (1.549 m), weight 53 kg, SpO2 100%.        Intake/Output Summary (Last 24 hours) at 11/22/2024 1119 Last data filed at 11/22/2024 1108 Gross per 24 hour  Intake 2188.83 ml  Output 2050 ml  Net 138.83 ml   Filed Weights   11/17/24 0500 11/22/24 0500 11/22/24 1109  Weight: 51.5 kg 53 kg 53 kg   Examination:  General: Critically ill, awake, HENT: [place]/AT. Cor track in place Lungs: Coarse , no crackles  Cardiovascular: RR, no murmurs Abdomen: soft, mildly tender  Extremities: Warm to touch, +DP pulses b/l.   Neuro: Awake, alert, following commands. speaking  Resolved problem list  Lactic acidosis - resolved  ARDS AKI withdrawl  Assessment and Plan   Acute respiratory failure due to MRSA pneumonia requiring re-intubation on 11/16/2024 extubated 11/20/2024 AMS secondary to alcohol  withdrawal and acute hepatic encephalopathy requiring 2 intubations this admission (initially at [facility], transferred to  [facility] on 11/27) Arrhythmias - new systolic dysfunction Alcohol  abuse hx Alcoholic liver disease Prior history of hep C Chronic thrombocytopenia History of left femur fracture requiring ORIF  Hx of recurrent hemarthrosis of left knee    - doing well post extubation - speech evaluation. Continue TF for now - continue vancomycin  - continue lactulose  - continue SCDs and H2 blockers - platelets improving, will consider lovenox  [place] tomorrow - reports hearing loss - can hear well but reports he feels it has changed from before- did have significant upper airways and sinus secretions- start flonase  and afrin. CT sinus today. Hold lasix . Consider ENT evaluation if unchanged   I have independently spent a total critical care time of 35 minutes related to patient care.  Will transfer out of ICU today  Labs   CBC: Recent Labs  Lab 11/18/24 0335 11/19/24 0330 11/20/24 0427 11/20/24 1108 11/21/24 0430 11/22/24 0403  WBC 9.8 8.6 9.3  --  9.2 12.4*  HGB 8.6* 8.4* 8.5* 11.9* 8.2* 8.3*  HCT 26.6* 26.0* 26.0* 35.0* 25.0* 24.3*  MCV 110.4* 110.6* 106.6*  --  108.2* 105.7*  PLT 34* 36* 41*  --  49* 56*    Basic Metabolic Panel: Recent Labs  Lab 11/16/24 0500 11/16/24 1120 11/17/24 0500 11/17/24 1206 11/18/24 0335 11/19/24 0330 11/19/24 1349 11/20/24 0427 11/20/24 1108 11/21/24 0430 11/22/24 0403  NA 152*   < > 149*   < > 150* 147* 140 139 152* 135 137  K 3.2*   < > 3.0*   < > 3.6 3.8 3.9 3.8 4.2 4.0 4.3  CL 106   < > 105  --  109 111 105 101  --  101 98  CO2 35*   < > 36*  --  33* 31 31 30   --  29 29  GLUCOSE 116*   < > 127*  --  137* 123* 111* 124*  --  122* 128*  BUN 30*   < > 31*  --  31* 26* 27* 27*  --  28* 25*  CREATININE 1.22   < > 1.57*  --  1.38* 1.02 1.07 1.08  --  0.87 0.97  CALCIUM  8.3*   < > 8.6*  --  8.3* 8.1* 8.2* 8.2*  --  7.9* 8.6*  MG 1.7   < > 2.8*  --  2.3 2.2  --  2.1  --  2.1 2.1  PHOS 3.2  --  4.2  --  2.8 3.9  --   --   --   --   --    < > =  values in this interval not displayed.   GFR: Estimated Creatinine Clearance: 76.4 mL/min (by C-G formula based on SCr of 0.97 mg/dL). Recent Labs  Lab 11/19/24 0330 11/20/24 0427 11/21/24 0430 11/22/24 0403  WBC 8.6 9.3 9.2 12.4*    Liver Function Tests: Recent Labs  Lab 11/16/24 0500 11/17/24 0500 11/18/24 0335 11/19/24 0330  ALBUMIN  2.1* 1.8* 1.7* 1.6*   No results for input(s): LIPASE, AMYLASE in the last 168 hours. Recent Labs  Lab 11/16/24 1056  AMMONIA 34    ABG    Component Value Date/Time   PHART  7.493 (H) 11/20/2024 1108   PCO2ART 45.0 11/20/2024 1108   PO2ART 107 11/20/2024 1108   HCO3 34.3 (H) 11/20/2024 1108   TCO2 36 (H) 11/20/2024 1108   O2SAT 98 11/20/2024 1108     Coagulation Profile: No results for input(s): INR, PROTIME in the last 168 hours.   Cardiac Enzymes: No results for input(s): CKTOTAL, CKMB, CKMBINDEX, TROPONINI in the last 168 hours.  HbA1C: Hgb A1c MFr Bld  Date/Time Value Ref Range Status  10/22/2024 11:54 PM 4.2 (L) 4.8 - 5.6 % Final    Comment:    (NOTE) Diagnosis of Diabetes The following HbA1c ranges recommended by the American Diabetes Association (ADA) may be used as an aid in the diagnosis of diabetes mellitus.  Hemoglobin             Suggested A1C NGSP%              Diagnosis  <5.7                   Non Diabetic  5.7-6.4                Pre-Diabetic  >6.4                   Diabetic  <7.0                   Glycemic control for                       adults with diabetes.      CBG: Recent Labs  Lab 11/21/24 1916 11/21/24 2310 11/22/24 0313 11/22/24 0721 11/22/24 1104  GLUCAP 72 121* 134* 121* 112*

## 2024-11-22 NOTE — Plan of Care (Signed)
?  Problem: Clinical Measurements: ?Goal: Respiratory complications will improve ?Outcome: Progressing ?  ?Problem: Activity: ?Goal: Risk for activity intolerance will decrease ?Outcome: Progressing ?  ?Problem: Nutrition: ?Goal: Adequate nutrition will be maintained ?Outcome: Progressing ?  ?Problem: Coping: ?Goal: Level of anxiety will decrease ?Outcome: Progressing ?  ?

## 2024-11-23 ENCOUNTER — Inpatient Hospital Stay (HOSPITAL_COMMUNITY): Payer: MEDICAID

## 2024-11-23 LAB — VANCOMYCIN, TROUGH: Vancomycin Tr: 9 ug/mL — ABNORMAL LOW (ref 15–20)

## 2024-11-23 LAB — BASIC METABOLIC PANEL WITH GFR
Anion gap: 6 (ref 5–15)
BUN: 21 mg/dL — ABNORMAL HIGH (ref 6–20)
CO2: 30 mmol/L (ref 22–32)
Calcium: 8.3 mg/dL — ABNORMAL LOW (ref 8.9–10.3)
Chloride: 100 mmol/L (ref 98–111)
Creatinine, Ser: 0.82 mg/dL (ref 0.61–1.24)
GFR, Estimated: >60 mL/min (ref >60)
Glucose, Bld: 132 mg/dL — ABNORMAL HIGH (ref 70–99)
Potassium: 4.1 mmol/L (ref 3.5–5.1)
Sodium: 136 mmol/L (ref 135–145)

## 2024-11-23 LAB — CBC
HCT: 24.2 % — ABNORMAL LOW (ref 39.0–52.0)
Hemoglobin: 8.2 g/dL — ABNORMAL LOW (ref 13.0–17.0)
MCH: 36.3 pg — ABNORMAL HIGH (ref 26.0–34.0)
MCHC: 33.9 g/dL (ref 30.0–36.0)
MCV: 107.1 fL — ABNORMAL HIGH (ref 80.0–100.0)
Platelets: 78 K/uL — ABNORMAL LOW (ref 150–400)
RBC: 2.26 MIL/uL — ABNORMAL LOW (ref 4.22–5.81)
RDW: 13.8 % (ref 11.5–15.5)
WBC: 10.2 K/uL (ref 4.0–10.5)
nRBC: 0 % (ref 0.0–0.2)

## 2024-11-23 LAB — GLUCOSE, CAPILLARY
Glucose-Capillary: 121 mg/dL — ABNORMAL HIGH (ref 70–99)
Glucose-Capillary: 130 mg/dL — ABNORMAL HIGH (ref 70–99)
Glucose-Capillary: 121 mg/dL — ABNORMAL HIGH (ref 70–99)
Glucose-Capillary: 117 mg/dL — ABNORMAL HIGH (ref 70–99)
Glucose-Capillary: 76 mg/dL (ref 70–99)

## 2024-11-23 LAB — MAGNESIUM: Magnesium: 2 mg/dL (ref 1.7–2.4)

## 2024-11-23 LAB — TRIGLYCERIDES: Triglycerides: 65 mg/dL (ref <150)

## 2024-11-23 MED ORDER — VANCOMYCIN HCL 500 MG/100ML IV SOLN
500.0000 mg | Freq: Two times a day (BID) | INTRAVENOUS | Status: AC
  Administered 2024-11-24 – 2024-11-25 (×4): 500 mg via INTRAVENOUS
  Filled 2024-11-23 (×4): qty 100

## 2024-11-23 MED ORDER — FOLIC ACID 1 MG PO TABS
1.0000 mg | ORAL_TABLET | Freq: Every day | ORAL | Status: DC
  Administered 2024-11-24 – 2024-11-27 (×4): 1 mg via ORAL
  Filled 2024-11-23 (×4): qty 1

## 2024-11-23 MED ORDER — VITAMIN D 25 MCG (1000 UNIT) PO TABS
1000.0000 [IU] | ORAL_TABLET | Freq: Every day | ORAL | Status: DC
  Administered 2024-11-24 – 2024-11-27 (×4): 1000 [IU] via ORAL
  Filled 2024-11-23 (×4): qty 1

## 2024-11-23 MED ORDER — ADULT MULTIVITAMIN W/MINERALS CH
1.0000 | ORAL_TABLET | Freq: Every day | ORAL | Status: DC
  Administered 2024-11-24 – 2024-11-27 (×4): 1 via ORAL
  Filled 2024-11-23 (×4): qty 1

## 2024-11-23 MED ORDER — SODIUM CHLORIDE 0.9% FLUSH: 10.0000 mL | INTRAVENOUS | Status: DC | PRN

## 2024-11-23 MED ORDER — ACETAMINOPHEN 325 MG PO TABS: 650.0000 mg | ORAL_TABLET | Freq: Three times a day (TID) | ORAL | Status: DC | PRN

## 2024-11-23 MED ORDER — FAMOTIDINE 20 MG PO TABS
20.0000 mg | ORAL_TABLET | Freq: Two times a day (BID) | ORAL | Status: DC
  Administered 2024-11-23 – 2024-11-27 (×8): 20 mg via ORAL
  Filled 2024-11-23 (×8): qty 1

## 2024-11-23 MED ORDER — HYDROXYZINE HCL 10 MG PO TABS
10.0000 mg | ORAL_TABLET | Freq: Three times a day (TID) | ORAL | Status: DC
  Administered 2024-11-23 – 2024-11-27 (×11): 10 mg via ORAL
  Filled 2024-11-23 (×11): qty 1

## 2024-11-23 MED ORDER — LACTULOSE 10 GM/15ML PO SOLN
10.0000 g | Freq: Three times a day (TID) | ORAL | Status: DC
  Administered 2024-11-23 – 2024-11-26 (×8): 10 g via ORAL
  Filled 2024-11-23 (×8): qty 15

## 2024-11-23 MED ORDER — GUAIFENESIN 100 MG/5ML PO LIQD
5.0000 mL | Freq: Four times a day (QID) | ORAL | Status: DC
  Administered 2024-11-23 – 2024-11-27 (×14): 5 mL via ORAL
  Filled 2024-11-23 (×14): qty 15

## 2024-11-23 MED ORDER — THIAMINE MONONITRATE 100 MG PO TABS
100.0000 mg | ORAL_TABLET | Freq: Every day | ORAL | Status: DC
  Administered 2024-11-24 – 2024-11-27 (×4): 100 mg via ORAL
  Filled 2024-11-23 (×4): qty 1

## 2024-11-23 MED ORDER — POLYETHYLENE GLYCOL 3350 17 G PO PACK: 17.0000 g | PACK | Freq: Every day | ORAL | Status: DC | PRN

## 2024-11-23 MED ORDER — SODIUM CHLORIDE 0.9% FLUSH
10.0000 mL | Freq: Two times a day (BID) | INTRAVENOUS | Status: DC
  Administered 2024-11-23 – 2024-11-27 (×8): 10 mL

## 2024-11-23 MED ORDER — ENSURE PLUS HIGH PROTEIN PO LIQD
237.0000 mL | Freq: Three times a day (TID) | ORAL | Status: DC
  Administered 2024-11-23 – 2024-11-27 (×8): 237 mL via ORAL

## 2024-11-23 NOTE — Progress Notes (Signed)
 Pharmacy Antibiotic Note  Gregory Murphy is a 38 y.o. male transferred to [facility] on 11/12/2024 with PNa/ARDS and encephalopathy.  Patient has been on Zosyn .  Worsening tachypnea and CXR, so Pharmacy has been consulted to restart vancomycin . Culture now growing MRSA so patient continues on vancomycin  monotherapy.  SCr improving, now afebrile and WBC normalized.  Vancomycin  1,250 mg IV Q24H- Dose administered 12/7 0836, infused over 90 mins   Vancomycin  peak- 41 12/7 1215  Vancomycin  trough- 9 12/8 0823  Calculated AUC 566, Cmin 8.9   Plan: Change vancomycin  dosing to 500 mg IV Q12H (starting 12/9 AM)  F/U DOT   Height: 5' 1 (154.9 cm) Weight: 54.8 kg (120 lb 13 oz) IBW/kg (Calculated) : 52.3  Temp (24hrs), Avg:98.8 F (37.1 C), Min:98.2 F (36.8 C), Max:99.3 F (37.4 C)  Recent Labs  Lab 11/18/24 0335 11/19/24 0330 11/19/24 1349 11/20/24 0427 11/21/24 0430 11/22/24 0403 11/22/24 1215  WBC 9.8 8.6  --  9.3 9.2 12.4*  --   CREATININE 1.38* 1.02 1.07 1.08 0.87 0.97  --   VANCOPEAK  --   --   --   --   --   --  41*    Estimated Creatinine Clearance: 76.4 mL/min (by C-G formula based on SCr of 0.97 mg/dL).    Allergies  Allergen Reactions   Latex Itching    Gloves     Azith 11/27 >> 11/29 Zosyn  11/27 >> 12/2 Vanc x1 11/27 Zyvox 12/1 >>   11/27 VR = 18 mcg/mL   11/27 MRSA PCR - negative 11/27 BCx - negative 11/28 TA - negative  12/1 TA - MRSA 12/1 MRSA PCR - negative  Massie Fila, PharmD Clinical Pharmacist  11/23/2024 7:48 AM

## 2024-11-23 NOTE — Progress Notes (Signed)
 Consultation Progress Note   Patient: Gregory Murphy FMW:[id number] DOB: February 19, 1986 DOA: 11/12/2024 DOS: the patient was seen and examined on 11/23/2024 Primary service: Winter Jocelyn, Landon BRAVO, MD  Brief hospital course:  38 yo M w/ pertinent PMH alcohol  abuse, alcoholic liver cirrhosis, chronic pancytopenia presents to [facility] on 11/27 with ards.   Patient has prior traumatic injury to left femur requiring rod placement from left hip to knee.  Has had prior left knee effusion requiring arthrocentesis back in July 2025.  Patient recently admitted on 11/7-11/14 for left knee effusion requiring arthrocentesis.  While in hospital patient became encephalopathic likely alcohol  withdrawal requiring brief Precedex , Librium  taper, CIWA protocol.   Patient recently started on methadone 3 days prior to this admit.  Patient admitted to [facility] on 11/20 with AMS presumed 2/2 to polysubstance abuse and possible withdrawal. Intbubated for airway protection.  Patient required intubation.  CT chest with b/l peripheral and perivascular groundglass . Ct head no acute abnormality. Patient was extubated on 11/25. While in hospital worsening withdrawal despite ativan  and phenobarb, started on precedex . Developed respiratory failure and reintubated on 11/27.  Patient with increasing O2 requirements and ARDS.     On 11/27 transferred to [facility] for fever, resp failure, possible intentional methadone OD, PNA, AKI, and encephalopathy due to HE and DT.   Transferred to [facility] on 12/8  Assessment and Plan: Acute respiratory failure due to MRSA pneumonia - requiring re-intubation on 11/16/2024 extubated 11/20/2024 - doing well post extubation - speech evaluation. Continue TF for now - continue vancomycin   AMS secondary to alcohol  withdrawal and acute hepatic encephalopathy  -Status improved, close to baseline. Status post 2 intubations this admission (initially at [facility], transferred to [facility] on 11/27) -  Check ammonia levels.  Arrhythmias - new systolic dysfunction  Alcohol  abuse hx Alcoholic liver disease Prior history of hep C Chronic thrombocytopenia-proving, monitor closely. Currently at 78. History of left femur fracture requiring ORIF  Hx of recurrent hemarthrosis of left knee    - continue lactulose  - continue SCDs and H2 blockers - platelets improving, consider Lovenox  12/9 while monitoring platelets for improvement - Hearing loss reports hearing loss - can hear well but reports he feels it has changed from before- did have significant upper airways and sinus secretions- start flonase  and afrin. CT sinus pending Hold lasix . Consider ENT evaluation if unchanged       [facility] will continue to follow the patient.  Subjective: Patient seen at bedside this morning, family was at bedside.  He complained of hearing loss, CT sinuses pending, he remains on tube feeding.  Physical Exam: Vitals:   11/23/24 0410 11/23/24 0420 11/23/24 0756 11/23/24 1207  BP: 116/72  116/73 (!) 127/108  Pulse: 88  84 78  Resp: 17  16 20   Temp: 99.3 F (37.4 C)   98.3 F (36.8 C)  TempSrc: Oral     SpO2: 98%  92% 98%  Weight:  54.8 kg    Height:       General No acute distress Eyes: PERRL, lids and conjunctivae normal ENMT: NG tube in place, Mucous membranes are moist.   Neck: normal, supple, no masses, no thyromegaly Respiratory: clear to auscultation bilaterally, no wheezing, no crackles. Normal respiratory effort. No accessory muscle use.  Cardiovascular: Regular rate and rhythm, no murmurs / rubs / gallops Abdomen: Soft, nontender nondistended. Musculoskeletal: no clubbing / cyanosis. No joint deformity upper and lower extremities.  Skin: no rashes, lesions, ulcers. No induration Neurologic: Facial  asymmetry, moving extremity spontaneously, speech fluent. Psychiatric: Normal judgment and insight. Alert and oriented x 3. Normal mood.   Data Reviewed:    CBC    Component Value  Date/Time   WBC 10.2 11/23/2024 0823   RBC 2.26 (L) 11/23/2024 0823   HGB 8.2 (L) 11/23/2024 0823   HCT 24.2 (L) 11/23/2024 0823   PLT 78 (L) 11/23/2024 0823   MCV 107.1 (H) 11/23/2024 0823   MCH 36.3 (H) 11/23/2024 0823   MCHC 33.9 11/23/2024 0823   RDW 13.8 11/23/2024 0823   LYMPHSABS 1.2 10/23/2024 0344   MONOABS 0.3 10/23/2024 0344   EOSABS 0.0 10/23/2024 0344   BASOSABS 0.1 10/23/2024 0344   CMP     Component Value Date/Time   NA 136 11/23/2024 0823   K 4.1 11/23/2024 0823   CL 100 11/23/2024 0823   CO2 30 11/23/2024 0823   GLUCOSE 132 (H) 11/23/2024 0823   BUN 21 (H) 11/23/2024 0823   CREATININE 0.82 11/23/2024 0823   CALCIUM  8.3 (L) 11/23/2024 0823   PROT 4.7 (L) 11/12/2024 2124   ALBUMIN  1.6 (L) 11/19/2024 0330   AST 99 (H) 11/12/2024 2124   ALT 19 11/12/2024 2124   ALKPHOS 60 11/12/2024 2124   BILITOT 3.3 (H) 11/12/2024 2124   GFRNONAA >60 11/23/2024 9176    Family Communication: Family at bedside, questions answered  Time spent: 35 minutes.  Author: Landon FORBES Baller, MD 11/23/2024 12:34 PM  For on call review [email or web address].

## 2024-11-23 NOTE — Evaluation (Signed)
 Occupational Therapy Evaluation Patient Details Name: Gregory Murphy MRN: [id number] DOB: 1986-04-18 Today's Date: 11/23/2024   History of Present Illness   The pt is a 38 yo male presenting 11/27 as a transfer from [facility] (admitted there 11/20) due to fever, resp failure, possible intentional methadone OD, PNA, AKI, and encephalopathy due to HE and DT. Extubated 12/6. PMH includes: alcohol  abuse, alcoholic liver cirrhosis, chronic pancytopenia, BLE trauma in Jan 2023 with ORIF L femur 1/4, IM nail tibia bilateral 1/1.     Clinical Impressions Pt admitted with the above diagnosis and has the deficits outlined below. Pt would benefit from cont OT to increase independence with basic adls and cognition so he can d/c home with friends that he has lived with before. Pt overall is min assist with most basic adls and is very impulsive with some STM deficits and processing deficits but is quickly improving per chart.  Pt may need 24/7 supervision at d/c and father says he can provide this but would like to talk to a social working about substance abuse rehab.  SW has been contacted.  If pt continues to improve as he is, rec pt have 24/7 assist to start until he is cognitively at baseline.  Pt would benefit from OPOT for cognitive and and adl follow up if pt has transportation. Will continue to see with focus on cognition and adls.     If plan is discharge home, recommend the following:   A little help with walking and/or transfers;A little help with bathing/dressing/bathroom;Assistance with cooking/housework;Direct supervision/assist for medications management;Direct supervision/assist for financial management;Assist for transportation;Help with stairs or ramp for entrance;Supervision due to cognitive status     Functional Status Assessment   Patient has had a recent decline in their functional status and demonstrates the ability to make significant improvements in function in a  reasonable and predictable amount of time.     Equipment Recommendations   None recommended by OT     Recommendations for Other Services         Precautions/Restrictions   Precautions Precautions: Fall Recall of Precautions/Restrictions: Impaired Precaution/Restrictions Comments: cortrak Restrictions Weight Bearing Restrictions Per Provider Order: No Other Position/Activity Restrictions: pt walks with limp at baseline due to injury to LLE in 2023     Mobility Bed Mobility Overal bed mobility: Needs Assistance Bed Mobility: Supine to Sit, Sit to Supine     Supine to sit: Contact guard     General bed mobility comments: Pt very impulsive with mobility and requires supervision due to lines    Transfers Overall transfer level: Needs assistance Equipment used: Rolling walker (2 wheels) Transfers: Sit to/from Stand, Bed to chair/wheelchair/BSC Sit to Stand: Contact guard assist     Step pivot transfers: Min assist     General transfer comment: cues for hand placement and to wait before walking due to dizziness.      Balance Overall balance assessment: Needs assistance Sitting-balance support: Feet supported Sitting balance-Leahy Scale: Fair Sitting balance - Comments: can static sit without UE support, single UE support with reachign or leaning   Standing balance support: Bilateral upper extremity supported, Reliant on assistive device for balance, During functional activity Standing balance-Leahy Scale: Fair Standing balance comment: Pt could let go of walker when statically standing. When walking, walker was needed                           ADL either performed or assessed with  clinical judgement   ADL Overall ADL's : Needs assistance/impaired Eating/Feeding: NPO Eating/Feeding Details (indicate cue type and reason): MBS today at noon Grooming: Wash/dry hands;Wash/dry face;Oral care;Contact guard assist;Standing   Upper Body Bathing: Set  up;Sitting   Lower Body Bathing: Minimal assistance;Sit to/from stand;Cueing for compensatory techniques Lower Body Bathing Details (indicate cue type and reason): decreased standing balance. Upper Body Dressing : Minimal assistance;Sitting   Lower Body Dressing: Minimal assistance;Sit to/from stand;Cueing for compensatory techniques   Toilet Transfer: +2 for physical assistance;Minimal assistance;Ambulation;Comfort height toilet;Rolling walker (2 wheels);Grab bars Toilet Transfer Details (indicate cue type and reason): Pt walked to bathroom with +2 assist only to bring IV/feeding tube lines. Min assist to get up and down and cues to use hand rails. Toileting- Clothing Manipulation and Hygiene: Sit to/from stand;Minimal assistance;Cueing for compensatory techniques       Functional mobility during ADLs: Minimal assistance;Rolling walker (2 wheels) General ADL Comments: Pt limited by decreased cognition, impulsivity and decreased balance.     Vision Baseline Vision/History: 0 No visual deficits Ability to See in Adequate Light: 0 Adequate Patient Visual Report: No change from baseline Vision Assessment?: No apparent visual deficits     Perception Perception: Within Functional Limits       Praxis Praxis: Not tested       Pertinent Vitals/Pain Pain Assessment Pain Assessment: No/denies pain     Extremity/Trunk Assessment Upper Extremity Assessment Upper Extremity Assessment: Overall WFL for tasks assessed   Lower Extremity Assessment Lower Extremity Assessment: Defer to PT evaluation   Cervical / Trunk Assessment Cervical / Trunk Assessment: Normal   Communication Communication Communication: Impaired Factors Affecting Communication: Hearing impaired   Cognition Arousal: Alert Behavior During Therapy: Flat affect, WFL for tasks assessed/performed Cognition: Cognition impaired   Orientation impairments: Place, Time, Situation Awareness: Intellectual awareness  impaired, Online awareness impaired Memory impairment (select all impairments): Short-term memory, Working civil service fast streamer, Conservation officer, historic buildings Attention impairment (select first level of impairment): Sustained attention Executive functioning impairment (select all impairments): Problem solving, Reasoning OT - Cognition Comments: Pt much clearer today than yesterday.  Did recall what date was with 5 minute delay without looking at calendar.  Pt had difficulty figuring money management questions in his head.                 Following commands: Impaired Following commands impaired: Follows multi-step commands inconsistently, Follows one step commands with increased time     Cueing  General Comments   Cueing Techniques: Verbal cues  Pt most limited by decreased balance and poor cognition/impulsivity. Father here today and provided clarity on past home set up and options for d/c   Exercises     Shoulder Instructions      Home Living Family/patient expects to be discharged to:: Private residence Living Arrangements: Parent;Other (Comment) (but wants to live with someone name Mr. and Mrs. Handy.  He has lived with them before. They are friends.) Available Help at Discharge: Family;Available 24 hours/day;Other (Comment) (faither says he can be with him 24/7 if 24/7 is needed.) Type of Home: House Home Access: Stairs to enter [employer] of Steps: 10 steps down   Home Layout: One level     Bathroom Shower/Tub: Producer, Television/film/video: Standard     Home Equipment: Agricultural Consultant (2 wheels);Crutches   Additional Comments: Father was in room today. Pt has been living with mother but plans to return to a friends home where he lives in basement (last name [place]).  They  cannot provide 24/7 assist but father can.  He is not sure where they would live if he needs 24/7 assist.  Maybe at fathers home.      Prior Functioning/Environment Prior Level of Function :  Independent/Modified Independent;Driving;Other (comment) (does not have license but drives anyway. No longer has a car.)             Mobility Comments: Independent with all mobility needs ADLs Comments: Independent with all ADLs    OT Problem List: Impaired balance (sitting and/or standing);Decreased cognition;Decreased safety awareness;Decreased knowledge of use of DME or AE;Decreased knowledge of precautions   OT Treatment/Interventions: Self-care/ADL training;Cognitive remediation/compensation;DME and/or AE instruction;Therapeutic activities;Balance training      OT Goals(Current goals can be found in the care plan section)   Acute Rehab OT Goals Patient Stated Goal: to get out of here OT Goal Formulation: With patient/family Time For Goal Achievement: 12/07/24 Potential to Achieve Goals: Good ADL Goals Pt Will Perform Eating: with modified independence;sitting Pt Will Perform Grooming: with modified independence;standing Additional ADL Goal #1: Pt will gather clothes and dress self with walker if needed Additional ADL Goal #2: Pt will walk to bathroom and complete all toileting with mod I Additional ADL Goal #3: Pt will bathe in shower with seat with supervision. Additional ADL Goal #4: Pt will complete cognitive screen to see what baseline cognitive level is to help determine cognitive improvement   OT Frequency:  Min 2X/week    Co-evaluation              AM-PAC OT 6 Clicks Daily Activity     Outcome Measure Help from another person eating meals?: Total Help from another person taking care of personal grooming?: A Little Help from another person toileting, which includes using toliet, bedpan, or urinal?: A Little Help from another person bathing (including washing, rinsing, drying)?: A Little Help from another person to put on and taking off regular upper body clothing?: A Little Help from another person to put on and taking off regular lower body clothing?: A  Little 6 Click Score: 16   End of Session Equipment Utilized During Treatment: Rolling walker (2 wheels) Nurse Communication: Mobility status  Activity Tolerance: Patient tolerated treatment well Patient left: in bed;with call bell/phone within reach;with bed alarm set;with family/visitor present  OT Visit Diagnosis: Unsteadiness on feet (R26.81);Feeding difficulties (R63.3);Other symptoms and signs involving cognitive function                Time: 9051-8976 OT Time Calculation (min): 35 min Charges:  OT General Charges $OT Visit: 1 Visit OT Evaluation $OT Eval Moderate Complexity: 1 Mod OT Treatments $Self Care/Home Management : 8-22 mins  Joshua Silvano Dragon 11/23/2024, 10:45 AM

## 2024-11-23 NOTE — Progress Notes (Signed)
 Physical Therapy Treatment Patient Details Name: Gregory Murphy MRN: [id number] DOB: 05-Sep-1986 Today's Date: 11/23/2024   History of Present Illness The pt is a 38 yo male presenting 11/27 as a transfer from [facility] (admitted there 11/20) due to fever, resp failure, possible intentional methadone OD, PNA, AKI, and encephalopathy due to HE and DT. Extubated 12/6. PMH includes: alcohol  abuse, alcoholic liver cirrhosis, chronic pancytopenia, BLE trauma in Jan 2023 with ORIF L femur 1/4, IM nail tibia bilateral 1/1.    PT Comments  Progressing well towards acute functional goals which have been updated as needed (now has stairs to navigate if d/c to a friends house with 24/7 supervision.) He was able to transfer and ambulate without physical assist (CGA and supervision respectively.) Reliant on RW for support. Mild antalgic patterning from old LE injury he reports. SpO2 was 93% on RA however declined to ambulate further distance at this time. Patient will continue to benefit from skilled physical therapy services to further improve independence with functional mobility.     If plan is discharge home, recommend the following: Assistance with cooking/housework;Direct supervision/assist for medications management;Direct supervision/assist for financial management;Assist for transportation;Help with stairs or ramp for entrance;A little help with walking and/or transfers;A little help with bathing/dressing/bathroom;Supervision due to cognitive status   Can travel by private vehicle        Equipment Recommendations  Rolling walker (2 wheels)    Recommendations for Other Services       Precautions / Restrictions Precautions Precautions: Fall Recall of Precautions/Restrictions: Impaired Precaution/Restrictions Comments: cortrak Restrictions Weight Bearing Restrictions Per Provider Order: No Other Position/Activity Restrictions: pt walks with limp at baseline due to injury to LLE in  2023     Mobility  Bed Mobility Overal bed mobility: Needs Assistance Bed Mobility: Supine to Sit, Sit to Supine     Supine to sit: Supervision Sit to supine: Supervision   General bed mobility comments: Supervision for safety, no physical assist required.    Transfers Overall transfer level: Needs assistance Equipment used: Rolling walker (2 wheels) Transfers: Sit to/from Stand, Bed to chair/wheelchair/BSC Sit to Stand: Contact guard assist           General transfer comment: CGA for safety with transition, bracing legs against bed for support while rising. Cues for hand placement. Decent control with transition to sit.    Ambulation/Gait Ambulation/Gait assistance: Supervision Gait Distance (Feet): 70 Feet Assistive device: Rolling walker (2 wheels) Gait Pattern/deviations: Step-through pattern, Decreased stride length, Antalgic Gait velocity: dec Gait velocity interpretation: <1.8 ft/sec, indicate of risk for recurrent falls   General Gait Details: Supervision for safety with fair control of RW and no overt LOB noted. mild antalgic pattern present but using RW appropriate for support. RW needs lowered for better efficency but pt declined offer. SpO2 93% on RA. Declined further distance today.   Stairs             Wheelchair Mobility     Tilt Bed    Modified Rankin (Stroke Patients Only)       Balance Overall balance assessment: Needs assistance Sitting-balance support: Feet supported Sitting balance-Leahy Scale: Fair     Standing balance support: During functional activity, No upper extremity supported Standing balance-Leahy Scale: Fair Standing balance comment: Stands without RW supervision, improved with RW for support.                            Communication Communication Communication: Impaired  Factors Affecting Communication: Hearing impaired  Cognition Arousal: Alert Behavior During Therapy: Flat affect, WFL for tasks  assessed/performed   PT - Cognitive impairments: No family/caregiver present to determine baseline                         Following commands: Impaired Following commands impaired: Follows multi-step commands inconsistently    Cueing Cueing Techniques: Verbal cues  Exercises      General Comments General comments (skin integrity, edema, etc.): SpO2 93% on RA while ambulating.      Pertinent Vitals/Pain Pain Assessment Pain Assessment: Faces Faces Pain Scale: Hurts even more Pain Location: LLE Pain Descriptors / Indicators: Sore Pain Intervention(s): Monitored during session    Home Living                          Prior Function            PT Goals (current goals can now be found in the care plan section) Acute Rehab PT Goals Patient Stated Goal: none stated this session PT Goal Formulation: Patient unable to participate in goal setting Time For Goal Achievement: 12/06/24 Potential to Achieve Goals: Good Progress towards PT goals: Progressing toward goals    Frequency    Min 2X/week      PT Plan      Co-evaluation              AM-PAC PT 6 Clicks Mobility   Outcome Measure  Help needed turning from your back to your side while in a flat bed without using bedrails?: None Help needed moving from lying on your back to sitting on the side of a flat bed without using bedrails?: A Little Help needed moving to and from a bed to a chair (including a wheelchair)?: A Little Help needed standing up from a chair using your arms (e.g., wheelchair or bedside chair)?: A Little Help needed to walk in hospital room?: A Little Help needed climbing 3-5 steps with a railing? : A Little 6 Click Score: 19    End of Session   Activity Tolerance: Patient tolerated treatment well (Self limiting) Patient left: in bed;with call bell/phone within reach;with bed alarm set   PT Visit Diagnosis: Unsteadiness on feet (R26.81);Muscle weakness (generalized)  (M62.81);Pain;Other abnormalities of gait and mobility (R26.89) Pain - part of body: Leg     Time: 1552-1600 PT Time Calculation (min) (ACUTE ONLY): 8 min  Charges:    $Therapeutic Activity: 23-37 mins PT General Charges $$ ACUTE PT VISIT: 1 Visit                     Leontine Roads, PT, DPT [facility]  Rehabilitation Services Physical Therapist Office: [phone] Website: [email or web address]    Leontine GORMAN Roads 11/23/2024, 4:38 PM

## 2024-11-23 NOTE — Evaluation (Signed)
 Modified Barium Swallow Study  Patient Details  Name: Gregory Murphy MRN: [id number] Date of Birth: [birth year]  Today's Date: 11/23/2024  Modified Barium Swallow completed.  Full report located under Chart Review in the Imaging Section.  History of Present Illness Gregory Murphy is a 37 yom who presented to [facility] 11/20 with AMS presumed 2/2 to polysubstance abuse and possible withdrawal. Intubated for airway protection 11/20-11/25; reintubated 11/27 and transferred to [facility]; self-extubated 11/29. Reintubation 12/1-12/6. Dx ARDS multilobar pna, HFrEF, cirrhosis (out of withdrawal window 12/7); cortrak 12/4. PMHx alcohol  abuse, alcoholic liver cirrhosis, chronic pancytopenia, opioid dependence on Methadone, hx L femur fx ORIF 12/2021, R fibula fx intramed nailing 12/2021.   Clinical Impression  Pt presents with functional swallowing with reliable airway protection on all but one instance when drinking thin liquids.  During single episode of trace aspiration, material spilled into the larynx during sequential bolus consumption of thins. Aspiration elicited a cough response.  During all other swallows across consistencies he demonstrated normal chewing, bolus propulsion, pharyngeal contraction and UES patency. Recommend initiating a regular diet with thin liquids; may take meds whole with water . SLP will follow briefly to ensure toleration.  DIGEST Swallow Severity Rating*  Safety: 1  Efficiency: 0  Overall Pharyngeal Swallow Severity: 1  1: mild; 2: moderate; 3: severe; 4: profound  *The Dynamic Imaging Grade of Swallowing Toxicity is standardized for the head and neck cancer population, however, demonstrates promising clinical applications across populations to standardize the clinical rating of pharyngeal swallow safety and severity.  Factors that may increase risk of adverse event in presence of aspiration Gregory Murphy & Gregory Murphy 2021): Weak cough;Poor general health and/or compromised  immunity  Swallow Evaluation Recommendations Recommendations: PO diet PO Diet Recommendation: Regular;Thin liquids (Level 0) Liquid Administration via: Cup;Straw Medication Administration: Whole meds with liquid Supervision: Patient able to self-feed Swallowing strategies  : Small bites/sips Oral care recommendations: Oral care BID (2x/day)      Gregory Murphy 11/23/2024,1:40 PM  Loanne Emery L. Vona, MA CCC/SLP Clinical Specialist - Acute Care SLP Acute Rehabilitation Services Office number [phone]

## 2024-11-23 NOTE — Progress Notes (Signed)

## 2024-11-23 NOTE — Progress Notes (Addendum)
 Nutrition Follow-up  DOCUMENTATION CODES:  Non-severe (moderate) malnutrition in context of social or environmental circumstances  INTERVENTION:  Discontinue TF and free water . Ensure Plus High Protein PO TID. Each supplement provides 350 Kcals and 20 grams of protein. Magic Cup TID. Each supplement provides 290 Kcals and 9 grams of protein. Room service with assist. Continue daily multivitamin, 100 mg thiamine , 1000 units Vit D, and 1 mg folic acid  daily. Convert meds to PO. Continue lactulose  per provider.  NUTRITION DIAGNOSIS:  Moderate Malnutrition related to social / environmental circumstances as evidenced by mild muscle depletion, mild fat depletion - remains applicable  GOAL:  Patient will meet greater than or equal to 90% of their needs - progressing  MONITOR:  PO intake, Supplement acceptance, Labs, Weight trends  REASON FOR ASSESSMENT:  Follow-up for: Ventilator, Consult Enteral/tube feeding initiation and management  ASSESSMENT:  Patient presented with ARDS, altered mental status 2/2 polysubstance abuse and withdrawal requiring intubation. PMH ETOH abuse, alcoholic liver cirrhosis, alcoholic hepatitis with ascites, alcoholic gastritis, splenic rupture, chronic pancytopenia, methadone use for opioid dependence, and TBI.  11/20 presented to [facility], intubated 11/25 extubated 11/27 reintubated for worsening withdrawal symptoms and transferred to [facility]  11/29 self-extubated 12/01 Cortrak placed (gastric), reintubated and bronchoscopy 12/02 TF restarted 12/07 failed SLP swallow eval 12/08 passed MBS, SLP recommends pulling Cortrak  Update: Passed MBS with reg/thin. SLP recommends d/c Cortrak. Visited the patient (Cortrak already removed) who has lunch tray with chicken fingers at bedside untouched. He says he does not like it. Offered to order him a different meal and he declined saying he will order later. He is about to eat some ice cream and dessert. He  states he has a good appetite and is amenable to Ensure+HP and Magic Cup in chocolate flavor. Encouraged patient to eat/drink ONS TID and he agreed. He tells me his UBW is 135 lbs but was unable to provide time frame.  Scheduled Meds:  alteplase   2 mg Intracatheter Once   Chlorhexidine  Gluconate Cloth  6 each Topical Daily   cholecalciferol   1,000 Units Per Tube Daily   famotidine   20 mg Per Tube BID   feeding supplement (PROSource TF20)  60 mL Per Tube Daily   fluticasone   2 spray Each Nare Daily   folic acid   1 mg Per Tube Daily   free water   100 mL Per Tube Q4H   guaiFENesin   5 mL Per Tube QID   hydrOXYzine   10 mg Per Tube TID   insulin  aspart  0-9 Units Subcutaneous Q4H   lactulose   10 g Per Tube TID   multivitamin with minerals  1 tablet Per Tube Daily   nicotine   14 mg Transdermal Daily   oxymetazoline   1 spray Each Nare BID   pneumococcal 20-valent conjugate vaccine  0.5 mL Intramuscular Tomorrow-1000   sodium chloride  flush  10-40 mL Intracatheter Q12H   thiamine   100 mg Per Tube Daily   Continuous Infusions:  feeding supplement (VITAL 1.5 CAL) 1,000 mL (11/22/24 2223)   [START ON 11/24/2024] vancomycin       Diet Order             Diet regular Room service appropriate? Yes with Assist; Fluid consistency: Thin  Diet effective now                  Meal Intake: 0%  Labs:     Latest Ref Rng & Units 11/23/2024    8:23 AM 11/22/2024  4:03 AM 11/21/2024    4:30 AM  CMP  Glucose 70 - 99 mg/dL 867  871  877   BUN 6 - 20 mg/dL 21  25  28    Creatinine 0.61 - 1.24 mg/dL 9.17  9.02  9.12   Sodium 135 - 145 mmol/L 136  137  135   Potassium 3.5 - 5.1 mmol/L 4.1  4.3  4.0   Chloride 98 - 111 mmol/L 100  98  101   CO2 22 - 32 mmol/L 30  29  29    Calcium  8.9 - 10.3 mg/dL 8.3  8.6  7.9    I/O: -2.6 L since admit  NUTRITION - FOCUSED PHYSICAL EXAM: Flowsheet Row Most Recent Value  Orbital Region Mild depletion  Upper Arm Region Moderate depletion  Thoracic and Lumbar  Region Mild depletion  Buccal Region Mild depletion  Temple Region Mild depletion  Clavicle Bone Region No depletion  Clavicle and Acromion Bone Region Mild depletion  Scapular Bone Region No depletion  Dorsal Hand Unable to assess  Patellar Region Mild depletion  Anterior Thigh Region Mild depletion  Posterior Calf Region Moderate depletion  Edema (RD Assessment) Moderate  [bilateral forearms, feet]  Hair Reviewed  Eyes Reviewed  Mouth Reviewed  Skin Reviewed  Nails Unable to assess  [mittens]    EDUCATION NEEDS:  Education needs have been addressed  Skin:  Skin Assessment: Reviewed RN Assessment  Last BM:  12/7 type 7  Height:  Ht Readings from Last 1 Encounters:  11/22/24 5' 1 (1.549 m)   Weight:  Wt Readings from Last 10 Encounters:  11/23/24 54.8 kg  10/25/24 54.4 kg  10/08/24 58.1 kg  07/15/24 51.4 kg  03/25/24 49.9 kg  03/23/24 48.8 kg  03/20/24 52.6 kg  03/15/24 52.6 kg  03/01/24 50 kg  02/04/24 49.5 kg    Weight Change: relatively stable weight noted  Usual Body Weight: 135 lbs (61 Kg) per patient who is uncertain when he last weighed that.  Edema: generalized non-pitting  Ideal Body Weight:  50.9 kg   BMI:  Body mass index is 22.83 kg/m.  Estimated Nutritional Needs:  Kcal:  1700-1900 Protein:  85-105 Fluid:  >/=1700    Gregory Ruth, MS, RDN, LDN [place] See AMION for contact information Secure chat preferred

## 2024-11-23 NOTE — Progress Notes (Signed)
 Speech Language Pathology Treatment: Dysphagia  Patient Details Name: Gregory Murphy MRN: [id number] DOB: Nov 04, 1986 Today's Date: 11/23/2024 Time: 0900-0909 SLP Time Calculation (min) (ACUTE ONLY): 9 min  Assessment / Plan / Recommendation Clinical Impression  PLAN: Proceed with MBS today, scheduled for 12:30. Continue NPO, may have ice chips pending study.   Pt alert; speech is clearer and more intelligible today. Vocal quality and volume improved, although not resolved.  Accepted sips of water  with adequate attention and occasional throat-clearing post-swallow. Ready for instrumental swallow study - will plan for MBS given barriers to getting consent form signed for FEES.    HPI HPI: Gregory Murphy is a 41 yom who presented to [facility] 11/20 with AMS presumed 2/2 to polysubstance abuse and possible withdrawal. Intubated for airway protection 11/20-11/25; reintubated 11/27 and transferred to [facility]; self-extubated 11/29. Reintubation 12/1-12/6. Dx ARDS multilobar pna, HFrEF, cirrhosis (out of withdrawal window 12/7); cortrak 12/4. PMHx alcohol  abuse, alcoholic liver cirrhosis, chronic pancytopenia, opioid dependence on Methadone, hx L femur fx ORIF 12/2021, R fibula fx intramed nailing 12/2021.      SLP Plan  MBS        Swallow Evaluation Recommendations    tba     Recommendations   MBS                   Oral care prior to ice chip/H20               Gregory Murphy L. Vona, MA CCC/SLP Clinical Specialist - Acute Care SLP Acute Rehabilitation Services Office number [phone]  Gregory Murphy  11/23/2024, 9:13 AM

## 2024-11-24 LAB — GLUCOSE, CAPILLARY
Glucose-Capillary: 93 mg/dL (ref 70–99)
Glucose-Capillary: 124 mg/dL — ABNORMAL HIGH (ref 70–99)
Glucose-Capillary: 128 mg/dL — ABNORMAL HIGH (ref 70–99)
Glucose-Capillary: 95 mg/dL (ref 70–99)
Glucose-Capillary: 106 mg/dL — ABNORMAL HIGH (ref 70–99)
Glucose-Capillary: 177 mg/dL — ABNORMAL HIGH (ref 70–99)

## 2024-11-24 LAB — BASIC METABOLIC PANEL WITH GFR
Anion gap: 5 (ref 5–15)
BUN: 17 mg/dL (ref 6–20)
CO2: 26 mmol/L (ref 22–32)
Calcium: 7.9 mg/dL — ABNORMAL LOW (ref 8.9–10.3)
Chloride: 100 mmol/L (ref 98–111)
Creatinine, Ser: 0.81 mg/dL (ref 0.61–1.24)
GFR, Estimated: >60 mL/min (ref >60)
Glucose, Bld: 116 mg/dL — ABNORMAL HIGH (ref 70–99)
Potassium: 4.3 mmol/L (ref 3.5–5.1)
Sodium: 131 mmol/L — ABNORMAL LOW (ref 135–145)

## 2024-11-24 LAB — AMMONIA: Ammonia: 27 umol/L (ref 9–35)

## 2024-11-24 MED ORDER — PREDNISONE 20 MG PO TABS: 40.0000 mg | ORAL_TABLET | Freq: Every day | ORAL | Status: DC

## 2024-11-24 MED ORDER — ENOXAPARIN SODIUM 30 MG/0.3ML IJ SOSY
30.0000 mg | PREFILLED_SYRINGE | INTRAMUSCULAR | Status: DC
  Administered 2024-11-24 – 2024-11-26 (×3): 30 mg via SUBCUTANEOUS
  Filled 2024-11-24 (×3): qty 0.3

## 2024-11-24 MED ORDER — AMOXICILLIN-POT CLAVULANATE 875-125 MG PO TABS
1.0000 | ORAL_TABLET | Freq: Two times a day (BID) | ORAL | Status: DC
  Administered 2024-11-24 – 2024-11-27 (×7): 1 via ORAL
  Filled 2024-11-24 (×7): qty 1

## 2024-11-24 MED ORDER — PREDNISONE 20 MG PO TABS
40.0000 mg | ORAL_TABLET | Freq: Every day | ORAL | Status: DC
  Administered 2024-11-24 – 2024-11-27 (×4): 40 mg via ORAL
  Filled 2024-11-24 (×4): qty 2

## 2024-11-24 NOTE — Plan of Care (Signed)

## 2024-11-24 NOTE — Progress Notes (Signed)
  Inpatient Rehabilitation Admissions Coordinator   Met with patient and his grandfather, Lamar, at bedside for rehab assessment. Patient has progressed and no longer in need of CIR level rehab. We will sign off. Please call me with any questions.   Heron Leavell, RN, MSN Rehab Admissions Coordinator [phone]

## 2024-11-24 NOTE — Progress Notes (Signed)
 Consultation Progress Note   Patient: Gregory Murphy FMW:[id number] DOB: [birth year] DOA: 11/12/2024 DOS: the patient was seen and examined on 11/24/2024 Primary service: Romain Erion, Landon BRAVO, MD  Brief hospital course:  38 yo M w/ pertinent PMH alcohol  abuse, alcoholic liver cirrhosis, chronic pancytopenia presents to [facility] on 11/27 with ards.   Patient has prior traumatic injury to left femur requiring rod placement from left hip to knee.  Has had prior left knee effusion requiring arthrocentesis back in July 2025.  Patient recently admitted on 11/7-11/14 for left knee effusion requiring arthrocentesis.  While in hospital patient became encephalopathic likely alcohol  withdrawal requiring brief Precedex , Librium  taper, CIWA protocol.   Patient recently started on methadone 3 days prior to this admit.  Patient admitted to [facility] on 11/20 with AMS presumed 2/2 to polysubstance abuse and possible withdrawal. Intbubated for airway protection.  Patient required intubation.  CT chest with b/l peripheral and perivascular groundglass . Ct head no acute abnormality. Patient was extubated on 11/25. While in hospital worsening withdrawal despite ativan  and phenobarb, started on precedex . Developed respiratory failure and reintubated on 11/27.  Patient with increasing O2 requirements and ARDS.     On 11/27 transferred to [facility] for fever, resp failure, possible intentional methadone OD, PNA, AKI, and encephalopathy due to HE and DT.   Transferred to [facility] on 12/8 Feeding tube removed 12/8  Assessment and Plan: Acute respiratory failure due to MRSA pneumonia - requiring re-intubation on 11/16/2024 extubated 11/20/2024 - doing well post extubation - speech evaluation. Continue TF for now - continue vancomycin   AMS secondary to alcohol  withdrawal and acute hepatic encephalopathy  -Status improved, close to baseline. Status post 2 intubations this admission (initially at [facility], transferred to  [facility] on 11/27) - Normal ammonia levels.  Arrhythmias - new systolic dysfunction  Alcohol  abuse hx Alcoholic liver disease Prior history of hep C Chronic thrombocytopenia-proving, monitor closely. Currently at 78. History of left femur fracture requiring ORIF  Hx of recurrent hemarthrosis of left knee   Hyponatremia 131, encouraged p.o. intake. Monitor trend.  - Hearing loss  reports hearing loss - can hear well but reports he feels it has changed from before- did have significant upper airways and sinus secretions   CT sinus  1. Bilateral sphenoid sinus mucosal thickening and air-fluid levels. 2. Bilateral middle ear fluid and large mastoid effusions. Correlate with signs/symptoms of otomastoiditis. 3. Leftward nasal septal deviation with bony spur.   D/w ENT, Outpatient evaluation for hearing loss, will treat likely otomastoiditis with Augmentin , continue flonase  and follow clinical course   Hold lasix . Consider ENT evaluation if unchanged       [facility] will continue to follow the patient.  Subjective: Patient seen at bedside this morning, family was at bedside.  He reports ongoing hearing loss, productive cough and pleuritic chest pain Feeding tube was discontinued, and tolerating diet. Physical Exam: Vitals:   11/23/24 2040 11/24/24 0104 11/24/24 0437 11/24/24 0823  BP: 118/78 119/76 120/80 108/63  Pulse: 73 71 81 77  Resp: 20 20 20 18   Temp: 98.5 F (36.9 C) 98.2 F (36.8 C) 98.5 F (36.9 C) 98.2 F (36.8 C)  TempSrc: Oral Oral Oral   SpO2: 99% 100% 100% 96%  Weight:   52.6 kg   Height:       General No acute distress Eyes: PERRL, lids and conjunctivae normal ENMT: NG tube in place, Mucous membranes are moist.   Neck: normal, supple, no masses, no thyromegaly Respiratory:  clear to auscultation bilaterally, no wheezing, no crackles. Normal respiratory effort. No accessory muscle use.  Cardiovascular: Regular rate and rhythm, no murmurs / rubs /  gallops Abdomen: Soft, nontender nondistended. Musculoskeletal: no clubbing / cyanosis. No joint deformity upper and lower extremities.  Skin: no rashes, lesions, ulcers. No induration Neurologic: Facial asymmetry, moving extremity spontaneously, speech fluent. Psychiatric: Normal judgment and insight. Alert and oriented x 3. Normal mood.   Data Reviewed:    CBC    Component Value Date/Time   WBC 10.2 11/23/2024 0823   RBC 2.26 (L) 11/23/2024 0823   HGB 8.2 (L) 11/23/2024 0823   HCT 24.2 (L) 11/23/2024 0823   PLT 78 (L) 11/23/2024 0823   MCV 107.1 (H) 11/23/2024 0823   MCH 36.3 (H) 11/23/2024 0823   MCHC 33.9 11/23/2024 0823   RDW 13.8 11/23/2024 0823   LYMPHSABS 1.2 10/23/2024 0344   MONOABS 0.3 10/23/2024 0344   EOSABS 0.0 10/23/2024 0344   BASOSABS 0.1 10/23/2024 0344   CMP     Component Value Date/Time   NA 131 (L) 11/24/2024 1008   K 4.3 11/24/2024 1008   CL 100 11/24/2024 1008   CO2 26 11/24/2024 1008   GLUCOSE 116 (H) 11/24/2024 1008   BUN 17 11/24/2024 1008   CREATININE 0.81 11/24/2024 1008   CALCIUM  7.9 (L) 11/24/2024 1008   PROT 4.7 (L) 11/12/2024 2124   ALBUMIN  1.6 (L) 11/19/2024 0330   AST 99 (H) 11/12/2024 2124   ALT 19 11/12/2024 2124   ALKPHOS 60 11/12/2024 2124   BILITOT 3.3 (H) 11/12/2024 2124   GFRNONAA >60 11/24/2024 1008    Family Communication: Family at bedside, questions answered  Time spent: 35 minutes.  Author: Landon FORBES Baller, MD 11/24/2024 12:27 PM  For on call review [email or web address].

## 2024-11-25 DIAGNOSIS — J9601 Acute respiratory failure with hypoxia: Secondary | ICD-10-CM | POA: Diagnosis not present

## 2024-11-25 LAB — CBC
HCT: 26 % — ABNORMAL LOW (ref 39.0–52.0)
Hemoglobin: 8.7 g/dL — ABNORMAL LOW (ref 13.0–17.0)
MCH: 36 pg — ABNORMAL HIGH (ref 26.0–34.0)
MCHC: 33.5 g/dL (ref 30.0–36.0)
MCV: 107.4 fL — ABNORMAL HIGH (ref 80.0–100.0)
Platelets: 125 K/uL — ABNORMAL LOW (ref 150–400)
RBC: 2.42 MIL/uL — ABNORMAL LOW (ref 4.22–5.81)
RDW: 14.2 % (ref 11.5–15.5)
WBC: 12.3 K/uL — ABNORMAL HIGH (ref 4.0–10.5)
nRBC: 0 % (ref 0.0–0.2)

## 2024-11-25 LAB — GLUCOSE, CAPILLARY
Glucose-Capillary: 123 mg/dL — ABNORMAL HIGH (ref 70–99)
Glucose-Capillary: 129 mg/dL — ABNORMAL HIGH (ref 70–99)
Glucose-Capillary: 151 mg/dL — ABNORMAL HIGH (ref 70–99)
Glucose-Capillary: 164 mg/dL — ABNORMAL HIGH (ref 70–99)
Glucose-Capillary: 221 mg/dL — ABNORMAL HIGH (ref 70–99)

## 2024-11-25 NOTE — Progress Notes (Signed)
 [facility] night cross cover note:   I was notified by the patient's RN of the patient's residual orders for every 4 hours CBG monitoring with associated sliding scale insulin  from when the patient was previously on continuous tube feeds.  No longer on tube feeds.  Rather, the patient is tolerating a regular diet, with stable blood sugars over the last few days.  Based upon this, I have discontinued the existing orders for every 4 hour CBG monitoring along with associated sliding scale insulin .     Eva Pore, DO Hospitalist

## 2024-11-25 NOTE — Plan of Care (Signed)

## 2024-11-25 NOTE — Progress Notes (Signed)
 Physical Therapy Treatment Patient Details Name: Gregory Murphy MRN: [id number] DOB: 10-19-1986 Today's Date: 11/25/2024   History of Present Illness The pt is a 38 yo male presenting 11/27 as a transfer from [facility] (admitted there 11/20) due to fever, resp failure, possible intentional methadone OD, PNA, AKI, and encephalopathy due to HE and DT. Extubated 12/6. PMH includes: alcohol  abuse, alcoholic liver cirrhosis, chronic pancytopenia, BLE trauma in Jan 2023 with ORIF L femur 1/4, IM nail tibia bilateral 1/1.    PT Comments  Patient progressing and asking to walk without device.  Staggering to R due to L antalgic gait and imbalance needing min A at times to prevent falling into door facing.  Patient with HR up to 151 with stairs practice.  He is eager for d/c though some concern if he d/c back to living in friend's basement on his own.  Would recommend home with family initially with HHPT.  PT will continue to follow.     If plan is discharge home, recommend the following: Assistance with cooking/housework;Direct supervision/assist for medications management;Direct supervision/assist for financial management;Assist for transportation;Help with stairs or ramp for entrance;A little help with walking and/or transfers;A little help with bathing/dressing/bathroom;Supervision due to cognitive status   Can travel by private vehicle        Equipment Recommendations  Rolling walker (2 wheels)    Recommendations for Other Services       Precautions / Restrictions Precautions Precautions: Fall Recall of Precautions/Restrictions: Impaired Restrictions Other Position/Activity Restrictions: pt walks with limp at baseline due to injury to LLE in 2023     Mobility  Bed Mobility Overal bed mobility: Modified Independent                  Transfers Overall transfer level: Needs assistance Equipment used: None Transfers: Sit to/from Stand Sit to Stand: Contact guard  assist           General transfer comment: declined use of RW today, CGA for balance/safety    Ambulation/Gait Ambulation/Gait assistance: Contact guard assist Gait Distance (Feet): 200 Feet Assistive device: None Gait Pattern/deviations: Step-through pattern, Decreased stride length, Staggering right, Drifts right/left, Antalgic, Decreased stance time - left       General Gait Details: declined use of RW today, staggering R at times into door facing, sink, etc due to limited stance tolerance on L LE and needing CGA for balance/safety to prevent falls   Stairs Stairs: Yes Stairs assistance: Contact guard assist, Min assist Stair Management: One rail Right, Alternating pattern, Step to pattern, Forwards Number of Stairs: 4 (x 2) General stair comments: step through pattern to ascend with catching L foot on stair x 1; step to for descending. assist for preventing LOB initial ascent, then educated on step to leading with R though pt preferred step through improved on second trial   Wheelchair Mobility     Tilt Bed    Modified Rankin (Stroke Patients Only)       Balance Overall balance assessment: Needs assistance Sitting-balance support: Feet supported Sitting balance-Leahy Scale: Good       Standing balance-Leahy Scale: Good Standing balance comment: static balance without UE support                            Communication Communication Communication: Impaired Factors Affecting Communication: Hearing impaired  Cognition Arousal: Alert Behavior During Therapy: Flat affect   PT - Cognitive impairments: No family/caregiver present to determine  baseline                       PT - Cognition Comments: decreased safety awareness Following commands: Impaired Following commands impaired: Follows multi-step commands inconsistently    Cueing Cueing Techniques: Verbal cues  Exercises      General Comments General comments (skin integrity,  edema, etc.): HR to 151 with stair negotiation, SpO2 99%; recovered to 130's ambulating back to room      Pertinent Vitals/Pain Pain Assessment Faces Pain Scale: Hurts little more Pain Location: chronic L LE pain Pain Descriptors / Indicators: Aching, Other (Comment) (bone pain) Pain Intervention(s): Monitored during session    Home Living                          Prior Function            PT Goals (current goals can now be found in the care plan section) Progress towards PT goals: Progressing toward goals    Frequency    Min 2X/week      PT Plan      Co-evaluation              AM-PAC PT 6 Clicks Mobility   Outcome Measure  Help needed turning from your back to your side while in a flat bed without using bedrails?: None Help needed moving from lying on your back to sitting on the side of a flat bed without using bedrails?: None Help needed moving to and from a bed to a chair (including a wheelchair)?: None Help needed standing up from a chair using your arms (e.g., wheelchair or bedside chair)?: A Little Help needed to walk in hospital room?: A Little Help needed climbing 3-5 steps with a railing? : A Little 6 Click Score: 21    End of Session Equipment Utilized During Treatment: Gait belt Activity Tolerance: Patient tolerated treatment well Patient left: in bed;with call bell/phone within reach   PT Visit Diagnosis: Unsteadiness on feet (R26.81);Muscle weakness (generalized) (M62.81);Pain;Other abnormalities of gait and mobility (R26.89) Pain - Right/Left: Left Pain - part of body: Leg     Time: 1350-1403 PT Time Calculation (min) (ACUTE ONLY): 13 min  Charges:    $Gait Training: 8-22 mins PT General Charges $$ ACUTE PT VISIT: 1 Visit                     Micheline Portal, PT Acute Rehabilitation Services Office:[phone] 11/25/2024    Montie Portal 11/25/2024, 5:16 PM

## 2024-11-25 NOTE — Progress Notes (Signed)
 Consultation Progress Note   Patient: Gregory Murphy FMW:[id number] DOB: Jun 17, 1986 DOA: 11/12/2024 DOS: the patient was seen and examined on 11/25/2024 Primary service: Jahmil Macleod, Landon BRAVO, MD  Brief hospital course:  38 yo M w/ pertinent PMH alcohol  abuse, alcoholic liver cirrhosis, chronic pancytopenia presents to [facility] on 11/27 with ards.   Patient has prior traumatic injury to left femur requiring rod placement from left hip to knee.  Has had prior left knee effusion requiring arthrocentesis back in July 2025.  Patient recently admitted on 11/7-11/14 for left knee effusion requiring arthrocentesis.  While in hospital patient became encephalopathic likely alcohol  withdrawal requiring brief Precedex , Librium  taper, CIWA protocol.   Patient recently started on methadone 3 days prior to this admit.  Patient admitted to [facility] on 11/20 with AMS presumed 2/2 to polysubstance abuse and possible withdrawal. Intbubated for airway protection.  Patient required intubation.  CT chest with b/l peripheral and perivascular groundglass . Ct head no acute abnormality. Patient was extubated on 11/25. While in hospital worsening withdrawal despite ativan  and phenobarb, started on precedex . Developed respiratory failure and reintubated on 11/27.  Patient with increasing O2 requirements and ARDS.     On 11/27 transferred to [facility] for fever, resp failure, possible intentional methadone OD, PNA, AKI, and encephalopathy due to HE and DT.   Transferred to [facility] on 12/8 Feeding tube removed 12/8  Assessment and Plan: Acute respiratory failure due to MRSA pneumonia - requiring re-intubation on 11/16/2024 extubated 11/20/2024 - doing well post extubation - speech evaluation. Continue TF for now - continue vancomycin   AMS secondary to alcohol  withdrawal and acute hepatic encephalopathy  -Status improved, close to baseline. Status post 2 intubations this admission (initially at [facility], transferred to  [facility] on 11/27) - Normal ammonia levels.  Arrhythmias - new systolic dysfunction  Alcohol  abuse hx Alcoholic liver disease Prior history of hep C Chronic thrombocytopenia-proving, monitor closely. Currently at 78. History of left femur fracture requiring ORIF  Hx of recurrent hemarthrosis of left knee   Hyponatremia 131, encouraged p.o. intake. Monitor trend.  - Hearing loss  reports hearing loss - can hear well but reports he feels it has changed from before- did have significant upper airways and sinus secretions   CT sinus  1. Bilateral sphenoid sinus mucosal thickening and air-fluid levels. 2. Bilateral middle ear fluid and large mastoid effusions. Correlate with signs/symptoms of otomastoiditis. 3. Leftward nasal septal deviation with bony spur.   D/w ENT, Outpatient evaluation for hearing loss, will treat likely otomastoiditis with Augmentin , continue flonase  and follow clinical course   Hold lasix .        [facility] will continue to follow the patient.  Subjective: Patient seen at bedside this morning , he reports feeling well, no complaints, he tells me he would like to go home. He is off oxygen  and tolerating a diet.  Updated on labs -recheck CMP this morning to monitor sodium Physical Exam: Vitals:   11/25/24 0034 11/25/24 0500 11/25/24 0545 11/25/24 0850  BP: 107/75  110/75 116/69  Pulse: 86  91 (!) 102  Resp:    20  Temp: 98.1 F (36.7 C)  98 F (36.7 C) 98.5 F (36.9 C)  TempSrc:    Oral  SpO2: 100%  98% 98%  Weight:  51.5 kg    Height:       General No acute distress Eyes: PERRL, lids and conjunctivae normal ENMT: NG tube in place, Mucous membranes are moist.   Neck: normal,  supple, no masses, no thyromegaly Respiratory: clear to auscultation bilaterally, no wheezing, no crackles. Normal respiratory effort. No accessory muscle use.  Cardiovascular: Regular rate and rhythm, no murmurs / rubs / gallops Abdomen: Soft, nontender  nondistended. Musculoskeletal: no clubbing / cyanosis. No joint deformity upper and lower extremities.  Skin: no rashes, lesions, ulcers. No induration Neurologic: Facial asymmetry, moving extremity spontaneously, speech fluent. Psychiatric: Normal judgment and insight. Alert and oriented x 3. Normal mood.   Data Reviewed:    CBC    Component Value Date/Time   WBC 10.2 11/23/2024 0823   RBC 2.26 (L) 11/23/2024 0823   HGB 8.2 (L) 11/23/2024 0823   HCT 24.2 (L) 11/23/2024 0823   PLT 78 (L) 11/23/2024 0823   MCV 107.1 (H) 11/23/2024 0823   MCH 36.3 (H) 11/23/2024 0823   MCHC 33.9 11/23/2024 0823   RDW 13.8 11/23/2024 0823   LYMPHSABS 1.2 10/23/2024 0344   MONOABS 0.3 10/23/2024 0344   EOSABS 0.0 10/23/2024 0344   BASOSABS 0.1 10/23/2024 0344   CMP     Component Value Date/Time   NA 131 (L) 11/24/2024 1008   K 4.3 11/24/2024 1008   CL 100 11/24/2024 1008   CO2 26 11/24/2024 1008   GLUCOSE 116 (H) 11/24/2024 1008   BUN 17 11/24/2024 1008   CREATININE 0.81 11/24/2024 1008   CALCIUM  7.9 (L) 11/24/2024 1008   PROT 4.7 (L) 11/12/2024 2124   ALBUMIN  1.6 (L) 11/19/2024 0330   AST 99 (H) 11/12/2024 2124   ALT 19 11/12/2024 2124   ALKPHOS 60 11/12/2024 2124   BILITOT 3.3 (H) 11/12/2024 2124   GFRNONAA >60 11/24/2024 1008    Family Communication: Family at bedside, questions answered  Time spent: 35 minutes.  Author: Landon FORBES Baller, MD 11/25/2024 10:02 AM  For on call review [email or web address].

## 2024-11-26 ENCOUNTER — Encounter (HOSPITAL_COMMUNITY): Payer: Self-pay | Admitting: Pulmonary Disease

## 2024-11-26 DIAGNOSIS — I5020 Unspecified systolic (congestive) heart failure: Secondary | ICD-10-CM

## 2024-11-26 DIAGNOSIS — F1191 Opioid use, unspecified, in remission: Secondary | ICD-10-CM | POA: Diagnosis not present

## 2024-11-26 DIAGNOSIS — I471 Supraventricular tachycardia, unspecified: Secondary | ICD-10-CM | POA: Diagnosis not present

## 2024-11-26 DIAGNOSIS — Z8659 Personal history of other mental and behavioral disorders: Secondary | ICD-10-CM | POA: Diagnosis not present

## 2024-11-26 DIAGNOSIS — J9601 Acute respiratory failure with hypoxia: Secondary | ICD-10-CM | POA: Diagnosis not present

## 2024-11-26 LAB — COMPREHENSIVE METABOLIC PANEL WITH GFR
ALT: 50 U/L — ABNORMAL HIGH (ref 0–44)
AST: 81 U/L — ABNORMAL HIGH (ref 15–41)
Albumin: 1.9 g/dL — ABNORMAL LOW (ref 3.5–5.0)
Alkaline Phosphatase: 102 U/L (ref 38–126)
Anion gap: 10 (ref 5–15)
BUN: 16 mg/dL (ref 6–20)
CO2: 21 mmol/L — ABNORMAL LOW (ref 22–32)
Calcium: 8.3 mg/dL — ABNORMAL LOW (ref 8.9–10.3)
Chloride: 104 mmol/L (ref 98–111)
Creatinine, Ser: 0.83 mg/dL (ref 0.61–1.24)
GFR, Estimated: >60 mL/min (ref >60)
Glucose, Bld: 106 mg/dL — ABNORMAL HIGH (ref 70–99)
Potassium: 3.9 mmol/L (ref 3.5–5.1)
Sodium: 135 mmol/L (ref 135–145)
Total Bilirubin: 1.7 mg/dL — ABNORMAL HIGH (ref 0.0–1.2)
Total Protein: 6.3 g/dL — ABNORMAL LOW (ref 6.5–8.1)

## 2024-11-26 LAB — PHOSPHORUS: Phosphorus: 3.8 mg/dL (ref 2.5–4.6)

## 2024-11-26 LAB — MAGNESIUM: Magnesium: 1.7 mg/dL (ref 1.7–2.4)

## 2024-11-26 LAB — TRIGLYCERIDES: Triglycerides: 63 mg/dL (ref <150)

## 2024-11-26 MED ADMIN — Losartan Potassium Tab 25 MG: 12.5 mg | ORAL | NDC 68180037609

## 2024-11-26 MED ADMIN — Metoprolol Succinate Tab ER 24HR 25 MG (Tartrate Equiv): 25 mg | ORAL | NDC 72516003001

## 2024-11-26 MED FILL — Metoprolol Succinate Tab ER 24HR 25 MG (Tartrate Equiv): 25.0000 mg | ORAL | Qty: 1 | Status: AC

## 2024-11-26 MED FILL — Losartan Potassium Tab 25 MG: 12.5000 mg | ORAL | Qty: 0.5 | Status: AC

## 2024-11-26 NOTE — Progress Notes (Signed)
 Speech Language Pathology Treatment: Dysphagia  Patient Details Name: Gregory Murphy MRN: [id number] DOB: 1986-06-30 Today's Date: 11/26/2024 Time: 8976-8967 SLP Time Calculation (min) (ACUTE ONLY): 9 min  Assessment / Plan / Recommendation Clinical Impression  Pt has made rapid improvements in swallowing.  Post-extubation dysphagia was transient and now is resolved. Pt is tolerating a regular diet with thin liquids. There are no concerns for aspiration.  RD is following and helping to find foods that he likes.  No SLP f/u is needed - goal met. Will sign off.    HPI HPI: Gregory Murphy is a 82 yom who presented to [facility] 11/20 with AMS presumed 2/2 to polysubstance abuse and possible withdrawal. Intubated for airway protection 11/20-11/25; reintubated 11/27 and transferred to [facility]; self-extubated 11/29. Reintubation 12/1-12/6. Dx ARDS multilobar pna, HFrEF, cirrhosis (out of withdrawal window 12/7); cortrak 12/4. PMHx alcohol  abuse, alcoholic liver cirrhosis, chronic pancytopenia, opioid dependence on Methadone, hx L femur fx ORIF 12/2021, R fibula fx intramed nailing 12/2021.      SLP Plan  All goals met        Swallow Evaluation Recommendations   Recommendations: PO diet PO Diet Recommendation: Regular;Thin liquids (Level 0) Liquid Administration via: Cup;Straw Medication Administration: Whole meds with liquid Supervision: Patient able to self-feed Oral care recommendations: Oral care BID (2x/day)     Recommendations                     Oral care BID     Dysphagia, pharyngeal phase (R13.13)     All goals met    Gregory Murphy L. Vona, MA CCC/SLP Clinical Specialist - Acute Care SLP Acute Rehabilitation Services Office number [phone]  Gregory Murphy  11/26/2024, 11:10 AM

## 2024-11-26 NOTE — Progress Notes (Signed)
 Physical Therapy Treatment Patient Details Name: Gregory Murphy MRN: [id number] DOB: 12-Aug-1986 Today's Date: 11/26/2024   History of Present Illness The pt is a 38 yo male presenting 11/27 as a transfer from [facility] (admitted there 11/20) due to fever, resp failure, possible intentional methadone OD, PNA, AKI, and encephalopathy due to HE and DT. Extubated 12/6. PMH includes: alcohol  abuse, alcoholic liver cirrhosis, chronic pancytopenia, BLE trauma in Jan 2023 with ORIF L femur 1/4, IM nail tibia bilateral 1/1.    PT Comments  Pt received in supine and agreeable to session. Pt demonstrates slightly improved stability during ambulation this session, but still some increased instability during turns. Discussed use of cane for increased support initially with pt reporting that he has one at home. Pt able to complete stair trial with HR up to 160. Pt reports elevated HR at baseline. Pt continues to benefit from PT services to progress toward functional mobility goals.    If plan is discharge home, recommend the following: Assistance with cooking/housework;Direct supervision/assist for medications management;Direct supervision/assist for financial management;Assist for transportation;Help with stairs or ramp for entrance;A little help with walking and/or transfers;A little help with bathing/dressing/bathroom;Supervision due to cognitive status   Can travel by private vehicle        Equipment Recommendations  Rolling walker (2 wheels)    Recommendations for Other Services       Precautions / Restrictions Precautions Precautions: Fall Recall of Precautions/Restrictions: Impaired Restrictions Weight Bearing Restrictions Per Provider Order: No Other Position/Activity Restrictions: pt walks with limp at baseline due to injury to LLE in 2023     Mobility  Bed Mobility Overal bed mobility: Modified Independent                  Transfers Overall transfer level: Needs  assistance Equipment used: None Transfers: Sit to/from Stand Sit to Stand: Supervision                Ambulation/Gait Ambulation/Gait assistance: Contact guard assist Gait Distance (Feet): 200 Feet Assistive device: None Gait Pattern/deviations: Step-through pattern, Decreased stride length, Drifts right/left, Antalgic, Decreased stance time - left       General Gait Details: some instability without UE support and mild LOB during turns requring light min A to correct   Stairs Stairs: Yes Stairs assistance: Contact guard assist Stair Management: Step to pattern, Alternating pattern, Forwards, One rail Right Number of Stairs: 3 (+6) General stair comments: Pt alternating between step through and step to with good recall of sequencing from previous session, but pt does not always adhere   Wheelchair Mobility     Tilt Bed    Modified Rankin (Stroke Patients Only)       Balance Overall balance assessment: Needs assistance Sitting-balance support: Feet supported Sitting balance-Leahy Scale: Good     Standing balance support: During functional activity, No upper extremity supported Standing balance-Leahy Scale: Good Standing balance comment: Slight instability without AD, but no overt LOB                            Communication Communication Communication: No apparent difficulties  Cognition Arousal: Alert Behavior During Therapy: Flat affect   PT - Cognitive impairments: No family/caregiver present to determine baseline, Safety/Judgement                         Following commands: Impaired Following commands impaired: Follows multi-step commands inconsistently  Cueing Cueing Techniques: Verbal cues  Exercises      General Comments General comments (skin integrity, edema, etc.): HR 113 at rest and up to 160 with mobility      Pertinent Vitals/Pain Pain Assessment Pain Assessment: Faces Faces Pain Scale: Hurts little  more Pain Location: chronic L LE pain Pain Descriptors / Indicators: Aching, Discomfort, Guarding Pain Intervention(s): Monitored during session     PT Goals (current goals can now be found in the care plan section) Acute Rehab PT Goals Patient Stated Goal: none stated this session PT Goal Formulation: Patient unable to participate in goal setting Time For Goal Achievement: 12/06/24 Progress towards PT goals: Progressing toward goals    Frequency    Min 2X/week       AM-PAC PT 6 Clicks Mobility   Outcome Measure  Help needed turning from your back to your side while in a flat bed without using bedrails?: None Help needed moving from lying on your back to sitting on the side of a flat bed without using bedrails?: None Help needed moving to and from a bed to a chair (including a wheelchair)?: None Help needed standing up from a chair using your arms (e.g., wheelchair or bedside chair)?: A Little Help needed to walk in hospital room?: A Little Help needed climbing 3-5 steps with a railing? : A Little 6 Click Score: 21    End of Session Equipment Utilized During Treatment: Gait belt Activity Tolerance: Patient tolerated treatment well Patient left: in bed;with call bell/phone within reach;Other (comment) (EOB with OT) Nurse Communication: Mobility status PT Visit Diagnosis: Unsteadiness on feet (R26.81);Muscle weakness (generalized) (M62.81);Pain;Other abnormalities of gait and mobility (R26.89) Pain - Right/Left: Left Pain - part of body: Leg     Time: 9053-8996 PT Time Calculation (min) (ACUTE ONLY): 17 min  Charges:    $Gait Training: 8-22 mins PT General Charges $$ ACUTE PT VISIT: 1 Visit                    Darryle George, PTA Acute Rehabilitation Services Secure Chat Preferred  [phone]    Darryle George 11/26/2024, 10:16 AM

## 2024-11-26 NOTE — Consult Note (Signed)
 [facility] Psychiatric Consult Initial  Patient Name: .Gregory Murphy  MRN: [id number]  DOB: 01-Apr-1986  Consult Order details:  Orders (From admission, onward)     Start     Ordered   11/26/24 0907  IP CONSULT TO PSYCHIATRY       Ordering Provider: Carlota Landon BRAVO, MD  Provider:  (Not yet assigned)  Question Answer Comment  Location [facility]   Reason for Consult? Suspected Meth OD      11/26/24 9092             Mode of Visit: In person    Psychiatry Consult Evaluation  Service Date: November 26, 2024 LOS:  LOS: 14 days  Chief Complaint suspected overdose on methadone  Primary Psychiatric Diagnoses  Alcohol  use disorder by history 2.  Heroin use disorder in remission for 5 years 3.  Methadone maintenance program initiated recently.   Assessment  Gregory Murphy is a 38 y.o. male admitted: Medicallyfor 11/12/2024  6:03 PM for history of AMS with delirium requiring intubation.  Being monitored for respiratory failure. He carries the psychiatric diagnoses of alcohol  use disorder and heroin abuse and has a past medical history of as documented..   The patient is a 38 year old male with a long history of alcohol  use disorder and history of heroin abuse who lives with his mother and his 63-year-old daughter.  He reports that he started methadone 3 days prior to admission and was given 50 mg of methadone.  He claims that he was given the loading dose because he went to them for history of chronic severe pain and a past history of heroin abuse.  He states that it knocked him out and he was not aware of what was going on around him.  He claims that he went to [facility] and a program called [facility] for the methadone.  He denies intentional overdose.  However despite being on methadone he continues to drink alcohol .  He tends to minimize his use of alcohol .  He denies depression denies psychosis and denies any SI/HI/AVH.  He claims that he is already  enrolled and he is going to a 28-day rehab program after being discharged from here. Managed to contact the mother after the second attempt.  She reports that she had her son tried to contact [facility] at [phone] and from there he will be going to a program called [facility].  See below for full recommendations.   Diagnoses:  Active Hospital problems: Principal Problem:   Acute respiratory failure with hypoxemia (HCC) Active Problems:   Severe protein-calorie malnutrition   Malnutrition of moderate degree    Plan   ## Psychiatric Medication Recommendations:  The patient is completely detoxed, consider restarting naltrexone  50 mg p.o. which may help with his alcohol  use disorder as well as opioids.  If he has any residual methadone, it might precipitate withdrawals.  ## Medical Decision Making Capacity: Not specifically addressed in this encounter  ## Further Work-up:  -- As per the hospitalist EKG -- most recent EKG on 11/20/2024 had QtC of 434 -- Pertinent labwork reviewed earlier this admission includes: Further hospitalist   ## Disposition:-- Plan Post Discharge/Psychiatric Care Follow-up resources ideally patient should go directly from here to a 28-day rehab program.  He reports that he is already made an appointment.  Prior to discharge contact [facility] and [facility]. See number above. TOC to confirm please  ## Behavioral / Environmental: - No specific recommendations at  this time.     ## Safety and Observation Level:  - Based on my clinical evaluation, I estimate the patient to be at moderate risk of self harm in the current setting. - At this time, we recommend  routine. This decision is based on my review of the chart including patient's history and current presentation, interview of the patient, mental status examination, and consideration of suicide risk including evaluating suicidal ideation, plan, intent, suicidal or self-harm behaviors, risk factors, and protective  factors. This judgment is based on our ability to directly address suicide risk, implement suicide prevention strategies, and develop a safety plan while the patient is in the clinical setting. Please contact our team if there is a concern that risk level has changed.  CSSR Risk Category:C-SSRS RISK CATEGORY: No Risk  Suicide Risk Assessment: Patient has following modifiable risk factors for suicide: recklessness, which we are addressing by referring him to a long-term rehab program.. Patient has following non-modifiable or demographic risk factors for suicide: male gender Patient has the following protective factors against suicide: Supportive family and Minor children in the home  Thank you for this consult request. Recommendations have been communicated to the primary team.  We will f sign off at this time.   PAULETTE BEETS, MD       History of Present Illness  Relevant Aspects of [facility] Hospital Course:  Admitted on 11/12/2024 for opioid overdose, possibly accidental. They continue to follow for respiratory depression..   Patient Report:  The patient has a longstanding history of alcohol  use disorder, heroin use disorder and he claims that he has been in remission for 5 years off the heroin but has been drinking alcohol .  His last admission to the ED was on 10/08/2024 when he was noted to be intoxicated and he also has a history of alcoholic liver complications.  He reports that recently he went to the methadone clinic to request methadone for his pain as well as his substance use disorder.  Information is reviewed confusing because he was already taking naltrexone  in November.  Apparently this was discontinued and he was placed on methadone. He states that 3 days after he started the methadone he became very confused and obtunded and was admitted through the ED.  Reports that he lives with his mother and 40-year-old daughter and denies any recent stressors.  He denies any recent  psychiatric contact he denies any depression but has had substance use counseling and treatment.  Treatment.  He claims he has been to [facility].  EMR does not show any psychiatric follow-up and no prior suicidal attempts.  11/26/2024 : Mental status examination revealed an alert, oriented and fairly cooperative 38 year old male who maintains fair to good eye contact.  His speech is coherent without any obvious looseness of association or flight of ideas or tangentiality.  He reports that he lives with his mother and he has 1/8 grade education.  He has not been working lately.  He claims that he wanted to get on the methadone but now he realizes he cannot take methadone and does not want to go back on it.  He denies any active SI/HI/AVH.  He denied depression he denied any suicidal ideations and he states that it was not an accidental overdose he was just being titrated up on the methadone.  He is contracting for safety.  He reports that he has already contacted a 28-day program and would like to go to a program after being discharged from here.  Psych ROS:  Depression: Remote history Anxiety: Occasional Mania (lifetime and current): The Psychosis: (lifetime and current): Denies  Collateral information:  Contacted patient's mother at [phone].  Patient's mother reports that she firmly believes that this was not a suicide attempt.  He has cirrhosis of the liver and apparently decided to go on the methadone program and after the third day was to increase the dose of methadone when he got extremely confused and disoriented.  She does not think that he can tolerate the methadone.  She denies any depression and denies this as a suicidal attempt.  Review of Systems  Psychiatric/Behavioral:  The patient is nervous/anxious.      Psychiatric and Social History  Psychiatric History:  Information collected from patient and the records. Records indicate history of alcoholism, heroin use in the past.  No  prior hospitalizations other than for alcohol  use and detox.  He denies prior suicidal attempts and denies any psychosis.  Social History:  Patient is single, never been married but has a 73-year-old daughter and reports that he has 1/8 grade education.  He lives with his mother and is currently unable to work because of his pain and liver disease.  He continues to drink but denies using heroin.  Access to weapons/lethal means: Patient denies but need collateral information.  Substance History Significant longstanding history of alcohol  use disorder.  Prior history of heroin abuse in remission for 5 years.  Recently initiated methadone maintenance.  Exam Findings  Physical Exam: Per the hospitalist Vital Signs:  Temp:  [97.6 F (36.4 C)-99 F (37.2 C)] 98 F (36.7 C) (12/11 1133) Pulse Rate:  [86-97] 97 (12/11 1133) Resp:  [20] 20 (12/11 1133) BP: (107-124)/(74-86) 124/86 (12/11 1133) SpO2:  [96 %-99 %] 97 % (12/11 1133) Weight:  [54.5 kg] 54.5 kg (12/11 0500) Blood pressure 124/86, pulse 97, temperature 98 F (36.7 C), temperature source Oral, resp. rate 20, height 5' 1 (1.549 m), weight 54.5 kg, SpO2 97%. Body mass index is 22.7 kg/m.  Physical Exam  Mental Status Exam: General Appearance: Disheveled  Orientation:  Full (Time, Place, and Person)  Memory:  Immediate;   Fair Recent;   Fair Remote;   Fair  Concentration:  Concentration: Fair and Attention Span: Fair  Recall:  Fair  Attention  Good  Eye Contact:  Good  Speech:  Clear and Coherent  Language:  Fair  Volume:  Normal  Mood: Full range  Affect:  Appropriate  Thought Process:  Goal Directed  Thought Content:  Rumination  Suicidal Thoughts:  No  Homicidal Thoughts:  No  Judgement:  Fair  Insight:  Shallow  Psychomotor Activity:  NA  Akathisia:  No  Fund of Knowledge:  Fair      Assets:  Manufacturing Systems Engineer Desire for Improvement Housing  Cognition:  WNL  ADL's:  Intact  AIMS (if indicated):         Other History   These have been pulled in through the EMR, reviewed, and updated if appropriate.  Family History:  [name] family history includes Hypertension in his father.  Medical History: Past Medical History:  Diagnosis Date   Alcohol  withdrawal (HCC) 03/01/2024   Alcoholic cirrhosis of liver with ascites (HCC) 03/02/2024   Alcoholic gastritis 03/03/2024   Alcoholic hepatitis with ascites (HCC) 03/02/2024   Brain injury (HCC)    Cirrhosis with alcoholism (HCC)    Critical polytrauma 12/17/2021   Decompensated cirrhosis (HCC) 03/10/2024   Depression    Elevated AFP 03/03/2024  HCV antibody positive 03/03/2024   Insomnia    Macrocytic anemia 03/03/2024   Malnutrition of moderate degree 12/21/2021   Panic attack    Severe thrombocytopenia 03/03/2024   Splenic rupture 09/07/2023    Surgical History: Past Surgical History:  Procedure Laterality Date   ABDOMINAL SURGERY     CLOSED REDUCTION FINGER WITH PERCUTANEOUS PINNING Left 02/04/2024   Procedure: LEFT THUMB CLOSED REDUCTION FINGER WITH PERCUTANEOUS PINNing;  Surgeon: Arlinda Buster, MD;  Location: [facility];  Service: Orthopedics;  Laterality: Left;   DISTAL FEMUR BONE BRIDGE EXCISION Left    FEMUR CLOSED REDUCTION Left 12/17/2021   Procedure: CLOSED REDUCTION FEMORAL SHAFT;  Surgeon: Barton Drape, MD;  Location: [facility];  Service: Orthopedics;  Laterality: Left;   FOREARM SURGERY Left    x4-metal rods placed   I & D EXTREMITY Left 02/04/2024   Procedure: IRRIGATION AND DEBRIDEMENT OF LEFT THUMB;  Surgeon: Arlinda Buster, MD;  Location: [facility];  Service: Orthopedics;  Laterality: Left;   IR PARACENTESIS  03/02/2024   LEG SURGERY Right 2023   NAILBED REPAIR Left 02/04/2024   Procedure: LEFT THUMB NAILBED REPAIR;  Surgeon: Arlinda Buster, MD;  Location: [facility];  Service: Orthopedics;  Laterality: Left;   ORIF FEMUR FRACTURE Left 12/20/2021    Procedure: OPEN REDUCTION INTERNAL FIXATION (ORIF) DISTAL FEMUR FRACTURE;  Surgeon: Kendal Franky SQUIBB, MD;  Location: [facility];  Service: Orthopedics;  Laterality: Left;   TIBIA IM NAIL INSERTION Bilateral 12/17/2021   Procedure: RIGHT INTRAMEDULLARY (IM) NAIL TIBIAL; RIGHT CLOSED TREATMENT OF FIBULA FRACTURE; LEFT CLOSED REDUCTION SPLINTING OF PERIPROSTHETIC  SUPRACONDYLAR FEMUR FRACTURE;  Surgeon: Barton Drape, MD;  Location: [facility];  Service: Orthopedics;  Laterality: Bilateral;   WRIST ARTHROSCOPY WITH CARPOMETACARPEL [facility]) ARTHROPLASTY Left 03/21/2022   Procedure: left wrist arthroscopy with debridement as needed;  Surgeon: Sissy Cough, MD;  Location: [facility];  Service: Orthopedics;  Laterality: Left;  just needs 60 mins for surgery   WRIST SURGERY Right    pins     Medications:  Current Medications[1]  Allergies: Allergies[2]  PAULETTE BEETS, MD     [1]  Current Facility-Administered Medications:    acetaminophen  (TYLENOL ) tablet 650 mg, 650 mg, Oral, Q8H PRN, Uhlorn, Garett M, RPH   alteplase  (CATHFLO ACTIVASE ) injection 2 mg, 2 mg, Intracatheter, Once, Baral, Dipti, MD   amoxicillin -clavulanate (AUGMENTIN ) 875-125 MG per tablet 1 tablet, 1 tablet, Oral, Q12H, Dibia, Pauline E, MD, 1 tablet at 11/26/24 1228   Chlorhexidine  Gluconate Cloth 2 % PADS 6 each, 6 each, Topical, Daily, Claudene Toribio BROCKS, MD, 6 each at 11/26/24 1034   cholecalciferol  (VITAMIN D3) 25 MCG (1000 UNIT) tablet 1,000 Units, 1,000 Units, Oral, Daily, Marten Larraine HERO, RPH, 1,000 Units at 11/26/24 1032   enoxaparin  (LOVENOX ) injection 30 mg, 30 mg, Subcutaneous, Q24H, Dibia, Pauline E, MD, 30 mg at 11/26/24 1340   famotidine  (PEPCID ) tablet 20 mg, 20 mg, Oral, BID, Uhlorn, Garett M, RPH, 20 mg at 11/26/24 1032   feeding supplement (ENSURE PLUS HIGH PROTEIN) liquid 237 mL, 237 mL, Oral, TID BM, Dibia, Pauline E, MD, 237 mL at 11/26/24 1034   fentaNYL  (SUBLIMAZE ) injection 25-50 mcg, 25-50  mcg, Intravenous, Q2H PRN, Baral, Dipti, MD   fluticasone  (FLONASE ) 50 MCG/ACT nasal spray 2 spray, 2 spray, Each Nare, Daily, Baral, Dipti, MD, 2 spray at 11/25/24 1012   folic acid  (FOLVITE ) tablet 1 mg, 1 mg, Oral, Daily, Uhlorn, Garett M, RPH, 1 mg  at 11/26/24 1033   guaiFENesin  (ROBITUSSIN) 100 MG/5ML liquid 5 mL, 5 mL, Oral, QID, Uhlorn, Garett M, RPH, 5 mL at 11/26/24 1339   hydrOXYzine  (ATARAX ) tablet 10 mg, 10 mg, Oral, TID, Uhlorn, Garett M, RPH, 10 mg at 11/26/24 1032   LORazepam  (ATIVAN ) injection 1-2 mg, 1-2 mg, Intravenous, Q1H PRN, Autry, Lauren E, PA-C, 2 mg at 11/19/24 1226   losartan (COZAAR) tablet 12.5 mg, 12.5 mg, Oral, Daily, Henry Shaver B, NP   metoprolol  succinate (TOPROL -XL) 24 hr tablet 25 mg, 25 mg, Oral, Daily, Dibia, Pauline E, MD, 25 mg at 11/26/24 1228   multivitamin with minerals tablet 1 tablet, 1 tablet, Oral, Daily, Marten Peat M, RPH, 1 tablet at 11/26/24 1032   nicotine  (NICODERM CQ  - dosed in mg/24 hours) patch 14 mg, 14 mg, Transdermal, Daily, Payne, John D, PA-C, 14 mg at 11/26/24 1034   Oral care mouth rinse, 15 mL, Mouth Rinse, PRN, Desai, Nikita S, MD   Oral care mouth rinse, 15 mL, Mouth Rinse, PRN, Paliwal, Aditya, MD   pneumococcal 20-valent conjugate vaccine (PREVNAR 20 ) injection 0.5 mL, 0.5 mL, Intramuscular, Tomorrow-1000, Baral, Dipti, MD   polyethylene glycol (MIRALAX  / GLYCOLAX ) packet 17 g, 17 g, Oral, Daily PRN, Uhlorn, Garett M, RPH   predniSONE  (DELTASONE ) tablet 40 mg, 40 mg, Oral, Q breakfast, Dibia, Pauline E, MD, 40 mg at 11/26/24 1033   sodium chloride  flush (NS) 0.9 % injection 10-40 mL, 10-40 mL, Intracatheter, Q12H, Baral, Dipti, MD, 10 mL at 11/26/24 1034   sodium chloride  flush (NS) 0.9 % injection 10-40 mL, 10-40 mL, Intracatheter, PRN, Baral, Dipti, MD   thiamine  (VITAMIN B1) tablet 100 mg, 100 mg, Oral, Daily, Uhlorn, Garett M, RPH, 100 mg at 11/26/24 1034   white petrolatum  (VASELINE) gel, , Topical, PRN, Tawkaliyar,  Roya, DO [2]  Allergies Allergen Reactions   Latex Itching    Gloves

## 2024-11-26 NOTE — Plan of Care (Signed)

## 2024-11-26 NOTE — Progress Notes (Signed)
 Occupational Therapy Treatment Patient Details Name: Gregory Murphy MRN: [id number] DOB: 12-Jul-1986 Today's Date: 11/26/2024   History of present illness The pt is a 38 yo male presenting 11/27 as a transfer from [facility] (admitted there 11/20) due to fever, resp failure, possible intentional methadone OD, PNA, AKI, and encephalopathy due to HE and DT. Extubated 12/6. PMH includes: alcohol  abuse, alcoholic liver cirrhosis, chronic pancytopenia, BLE trauma in Jan 2023 with ORIF L femur 1/4, IM nail tibia bilateral 1/1.   OT comments  Pt is modified independent in toileting, standing grooming and LB dressing. Recommended seated showering at home, pt declining shower seat. Pt administered Short Blessed Test which revealed deficits in memory, which pt reports is not new and attention, which also may not be new. Recommended pt have family member assist with med management (pt uses a pill box) and with medical appointments. Educated in compensatory strategies for memory deficits. Recommend HHOT to assess IADL safety in the home.       If plan is discharge home, recommend the following:  Assistance with cooking/housework;Direct supervision/assist for financial management;Direct supervision/assist for medications management;Assist for transportation   Equipment Recommendations  None recommended by OT    Recommendations for Other Services      Precautions / Restrictions Precautions Precautions: Fall Recall of Precautions/Restrictions: Impaired Restrictions Other Position/Activity Restrictions: pt walks with limp at baseline due to injury to LLE in 2023       Mobility Bed Mobility               General bed mobility comments: seated EOB at beginning and end of session    Transfers Overall transfer level: Modified independent Equipment used: None               General transfer comment: able to stand and pick up object from floor without LOB     Balance Overall  balance assessment: Needs assistance   Sitting balance-Leahy Scale: Normal       Standing balance-Leahy Scale: Good                             ADL either performed or assessed with clinical judgement   ADL   Eating/Feeding: Independent;Sitting   Grooming: Wash/dry hands;Standing               Lower Body Dressing: Modified independent   Toilet Transfer: Ambulation;Modified Community Education Officer Details (indicate cue type and reason): pt has been routinely walking to bathroom and toileting independently Toileting- Clothing Manipulation and Hygiene: Modified independent       Functional mobility during ADLs: Supervision/safety General ADL Comments: educated in fall prevention, declined shower seat    Extremity/Trunk Insurance Account Manager Communication Communication: Impaired Factors Affecting Communication: Reduced clarity of speech   Cognition Arousal: Alert Behavior During Therapy: WFL for tasks assessed/performed Cognition: Cognition impaired     Awareness: Online awareness impaired, Intellectual awareness impaired Memory impairment (select all impairments): Short-term memory, Working memory Attention impairment (select first level of impairment): Sustained attention Executive functioning impairment (select all impairments): Problem solving OT - Cognition Comments: scored 19 on Short Blessed Test with errors in memory and attention.                 Following commands: Impaired Following commands impaired:  Follows multi-step commands inconsistently      Cueing   Cueing Techniques: Verbal cues  Exercises      Shoulder Instructions       General Comments HR 113 at rest and up to 160 with mobility    Pertinent Vitals/ Pain       Pain Assessment Pain Assessment: Faces Faces Pain Scale: Hurts little more Pain Location: chronic L LE pain Pain Descriptors /  Indicators: Aching, Discomfort, Guarding Pain Intervention(s): Monitored during session, Repositioned  Home Living                                          Prior Functioning/Environment              Frequency  Min 2X/week        Progress Toward Goals  OT Goals(current goals can now be found in the care plan section)  Progress towards OT goals: Progressing toward goals  Acute Rehab OT Goals OT Goal Formulation: With patient/family Time For Goal Achievement: 12/07/24 Potential to Achieve Goals: Good  Plan      Co-evaluation                 AM-PAC OT 6 Clicks Daily Activity     Outcome Measure   Help from another person eating meals?: None Help from another person taking care of personal grooming?: None Help from another person toileting, which includes using toliet, bedpan, or urinal?: None Help from another person bathing (including washing, rinsing, drying)?: A Little Help from another person to put on and taking off regular upper body clothing?: None Help from another person to put on and taking off regular lower body clothing?: None 6 Click Score: 23    End of Session Equipment Utilized During Treatment: Gait belt  OT Visit Diagnosis: Unsteadiness on feet (R26.81);Other symptoms and signs involving cognitive function   Activity Tolerance Patient tolerated treatment well   Patient Left in bed;with call bell/phone within reach;Other (comment) (ST in room)   Nurse Communication          Time: [phone] OT Time Calculation (min): 33 min  Charges: OT General Charges $OT Visit: 1 Visit OT Treatments $Self Care/Home Management : 23-37 mins Mliss HERO, OTR/L Acute Rehabilitation Services Office: [phone]   Kennth Mliss Helling 11/26/2024, 11:27 AM

## 2024-11-26 NOTE — Progress Notes (Signed)
 Nutrition Follow-up  DOCUMENTATION CODES:  Non-severe (moderate) malnutrition in context of social or environmental circumstances  INTERVENTION:  Recommend reducing or discontinuing lactulose  since ammonia has been WNL. Patient with persistent type 7 diarrhea. Discontinue Magic Cup - patient dislikes. Continue Ensure Plus High Protein PO TID. Each supplement provides 350 Kcals and 20 grams of protein. Continue Room service with assist. Continue daily multivitamin, 100 mg thiamine , 1000 units Vit D, and 1 mg folic acid  daily. Convert meds to PO.  NUTRITION DIAGNOSIS:  Moderate Malnutrition related to social / environmental circumstances as evidenced by mild muscle depletion, mild fat depletion - remains applicable  GOAL:  Patient will meet greater than or equal to 90% of their needs - progressing  MONITOR:  PO intake, Supplement acceptance, Labs, Weight trends  REASON FOR ASSESSMENT:  Follow-up for: Ventilator, Consult Enteral/tube feeding initiation and management  ASSESSMENT:  Patient presented with ARDS, altered mental status 2/2 polysubstance abuse and withdrawal requiring intubation. PMH ETOH abuse, alcoholic liver cirrhosis, alcoholic hepatitis with ascites, alcoholic gastritis, splenic rupture, chronic pancytopenia, methadone use for opioid dependence, and TBI.  11/20 presented to [facility], intubated 11/25 extubated 11/27 reintubated for worsening withdrawal symptoms and transferred to [facility]  11/29 self-extubated 12/01 Cortrak placed (gastric), reintubated and bronchoscopy 12/02 TF restarted 12/07 failed SLP swallow eval 12/08 passed MBS, SLP recommends pulling Cortrak; reg diet  Update: Visited the patient who states he has a good appetite but has mostly been eating meals without protein. He denies that he is vegetarian but states he just does not like the food here. He is consistently drinking 3 Ensures a day. He dislikes [employer] and would like that  discontinued. Encouraged the patient to continue drinking Ensure TID since that is currently his best source of protein and he agreed. The patient is able to obtain his protein needs with ONS plus some protein from meals.  Scheduled Meds:  alteplase   2 mg Intracatheter Once   amoxicillin -clavulanate  1 tablet Oral Q12H   Chlorhexidine  Gluconate Cloth  6 each Topical Daily   cholecalciferol   1,000 Units Oral Daily   enoxaparin  (LOVENOX ) injection  30 mg Subcutaneous Q24H   famotidine   20 mg Oral BID   feeding supplement  237 mL Oral TID BM   fluticasone   2 spray Each Nare Daily   folic acid   1 mg Oral Daily   guaiFENesin   5 mL Oral QID   hydrOXYzine   10 mg Oral TID   lactulose   10 g Oral TID   multivitamin with minerals  1 tablet Oral Daily   nicotine   14 mg Transdermal Daily   pneumococcal 20-valent conjugate vaccine  0.5 mL Intramuscular Tomorrow-1000   predniSONE   40 mg Oral Q breakfast   sodium chloride  flush  10-40 mL Intracatheter Q12H   thiamine   100 mg Oral Daily    Diet Order             Diet regular Room service appropriate? Yes with Assist; Fluid consistency: Thin  Diet effective now                  Meal Intake: Estimated approx 50%  Labs:     Latest Ref Rng & Units 11/25/2024   10:43 PM 11/24/2024   10:08 AM 11/23/2024    8:23 AM  CMP  Glucose 70 - 99 mg/dL 893  883  867   BUN 6 - 20 mg/dL 16  17  21    Creatinine 0.61 - 1.24 mg/dL  0.83  0.81  0.82   Sodium 135 - 145 mmol/L 135  131  136   Potassium 3.5 - 5.1 mmol/L 3.9  4.3  4.1   Chloride 98 - 111 mmol/L 104  100  100   CO2 22 - 32 mmol/L 21  26  30    Calcium  8.9 - 10.3 mg/dL 8.3  7.9  8.3   Total Protein 6.5 - 8.1 g/dL 6.3     Total Bilirubin 0.0 - 1.2 mg/dL 1.7     Alkaline Phos 38 - 126 U/L 102     AST 15 - 41 U/L 81     ALT 0 - 44 U/L 50       I/O: -2 L since admit  NUTRITION - FOCUSED PHYSICAL EXAM: Flowsheet Row Most Recent Value  Orbital Region Mild depletion  Upper Arm Region  Moderate depletion  Thoracic and Lumbar Region Mild depletion  Buccal Region Mild depletion  Temple Region Mild depletion  Clavicle Bone Region No depletion  Clavicle and Acromion Bone Region Mild depletion  Scapular Bone Region No depletion  Dorsal Hand Unable to assess  Patellar Region Mild depletion  Anterior Thigh Region Mild depletion  Posterior Calf Region Moderate depletion  Edema (RD Assessment) Moderate  [bilateral forearms, feet]  Hair Reviewed  Eyes Reviewed  Mouth Reviewed  Skin Reviewed  Nails Unable to assess  [mittens]    EDUCATION NEEDS:  Education needs have been addressed  Skin:  Skin Assessment: Reviewed RN Assessment  Last BM:  12/10 type 7  Height:  Ht Readings from Last 1 Encounters:  11/22/24 5' 1 (1.549 m)   Weight:  Wt Readings from Last 10 Encounters:  11/26/24 54.5 kg  10/25/24 54.4 kg  10/08/24 58.1 kg  07/15/24 51.4 kg  03/25/24 49.9 kg  03/23/24 48.8 kg  03/20/24 52.6 kg  03/15/24 52.6 kg  03/01/24 50 kg  02/04/24 49.5 kg    Weight Change: relatively stable weight noted  Usual Body Weight: 135 lbs (61 Kg) per patient who is uncertain when he last weighed that.  Edema: generalized non-pitting  Ideal Body Weight:  50.9 kg   BMI:  Body mass index is 22.7 kg/m.  Estimated Nutritional Needs:  Kcal:  1700-1900 Protein:  85-105 Fluid:  >/=1700    Gregory Ruth, MS, RDN, LDN [place]. [facility] See AMION for contact information Secure chat preferred

## 2024-11-26 NOTE — Progress Notes (Signed)
 Patient had SVT when getting up with therapy. Rate reaching 160. Also had a 3 beat run of V-tach about an hour later. MD Dibia made aware.

## 2024-11-26 NOTE — Consult Note (Addendum)
 Cardiology Consultation   Patient ID: Gregory Murphy MRN: [id number]; DOB: Nov 22, 1986  Admit date: 11/12/2024 Date of Consult: 11/26/2024  PCP:  Patient, [name]   [facility] Providers Cardiologist:  New, Dr. Kriste   Patient Profile: Gregory Murphy is a 38 y.o. male with a hx of alcohol  abuse, alcoholic liver cirrhosis, pancytopenia who is being seen 11/26/2024 for the evaluation of abnormal echo/SVT at the request of Dr. Carlota.  History of Present Illness: Mr. Murgia is a 38 year old male with past medical history noted above.  He has never been evaluated by cardiology in the past.  Prior traumatic injury of left femur requiring rod placement from left hip to knee and left knee effusion requiring arthrocentesis 06/2024.  Recently admitted 10/2024 with a knee effusion requiring arthrocentesis and while in the hospital patient became encephalopathic from likely alcohol  withdrawal requiring Precedex , Librium  taper and CIWA protocol.  He was started on methadone 3 days prior to presenting to [facility] on 11/20 with altered mental status presumed to be secondary to polysubstance abuse and withdrawal and required intubation for airway protection.  He was extubated on 11/25 but while in hospital developed worsening withdrawal despite Ativan  and phenobarbital .  Developed respiratory failure and was reintubated on 11/27 and transferred to [facility] for further evaluation and management.  Managed by PCCM for acute respiratory failure secondary to MRSA pneumonia as well as altered mental status secondary to alcohol  with drawl and acute hepatic encephalopathy.  Echocardiogram 11/28 with LVEF of 55 to 60%, no regional wall motion abnormality, normal RV, trivial MR.  He was extubated on 12/5 and transferred out of the ICU to internal medicine service.  Repeat limited echo 12/2 with LVEF of 45 to 50%, normal RV.  Today had an episode of SVT with rates into the 150s and 3  beats of NSVT later on in the morning after getting up to work with PT.   Cardiology asked to evaluate. He has been started on metoprolol .    Past Medical History:  Diagnosis Date   Alcohol  withdrawal (HCC) 03/01/2024   Alcoholic cirrhosis of liver with ascites (HCC) 03/02/2024   Alcoholic gastritis 03/03/2024   Alcoholic hepatitis with ascites (HCC) 03/02/2024   Brain injury (HCC)    Cirrhosis with alcoholism (HCC)    Critical polytrauma 12/17/2021   Decompensated cirrhosis (HCC) 03/10/2024   Depression    Elevated AFP 03/03/2024   HCV antibody positive 03/03/2024   Insomnia    Macrocytic anemia 03/03/2024   Malnutrition of moderate degree 12/21/2021   Panic attack    Severe thrombocytopenia 03/03/2024   Splenic rupture 09/07/2023    Past Surgical History:  Procedure Laterality Date   ABDOMINAL SURGERY     CLOSED REDUCTION FINGER WITH PERCUTANEOUS PINNING Left 02/04/2024   Procedure: LEFT THUMB CLOSED REDUCTION FINGER WITH PERCUTANEOUS PINNing;  Surgeon: Arlinda Buster, MD;  Location: [facility];  Service: Orthopedics;  Laterality: Left;   DISTAL FEMUR BONE BRIDGE EXCISION Left    FEMUR CLOSED REDUCTION Left 12/17/2021   Procedure: CLOSED REDUCTION FEMORAL SHAFT;  Surgeon: Barton Drape, MD;  Location: [facility];  Service: Orthopedics;  Laterality: Left;   FOREARM SURGERY Left    x4-metal rods placed   I & D EXTREMITY Left 02/04/2024   Procedure: IRRIGATION AND DEBRIDEMENT OF LEFT THUMB;  Surgeon: Arlinda Buster, MD;  Location: [facility];  Service: Orthopedics;  Laterality: Left;   IR PARACENTESIS  03/02/2024   LEG SURGERY Right  2023   NAILBED REPAIR Left 02/04/2024   Procedure: LEFT THUMB NAILBED REPAIR;  Surgeon: Arlinda Buster, MD;  Location: [facility];  Service: Orthopedics;  Laterality: Left;   ORIF FEMUR FRACTURE Left 12/20/2021   Procedure: OPEN REDUCTION INTERNAL FIXATION (ORIF) DISTAL FEMUR FRACTURE;  Surgeon:  Kendal Franky SQUIBB, MD;  Location: [facility];  Service: Orthopedics;  Laterality: Left;   TIBIA IM NAIL INSERTION Bilateral 12/17/2021   Procedure: RIGHT INTRAMEDULLARY (IM) NAIL TIBIAL; RIGHT CLOSED TREATMENT OF FIBULA FRACTURE; LEFT CLOSED REDUCTION SPLINTING OF PERIPROSTHETIC  SUPRACONDYLAR FEMUR FRACTURE;  Surgeon: Barton Drape, MD;  Location: [facility];  Service: Orthopedics;  Laterality: Bilateral;   WRIST ARTHROSCOPY WITH CARPOMETACARPEL [facility]) ARTHROPLASTY Left 03/21/2022   Procedure: left wrist arthroscopy with debridement as needed;  Surgeon: Sissy Cough, MD;  Location: [facility];  Service: Orthopedics;  Laterality: Left;  just needs 60 mins for surgery   WRIST SURGERY Right    pins     Scheduled Meds:  alteplase   2 mg Intracatheter Once   amoxicillin -clavulanate  1 tablet Oral Q12H   Chlorhexidine  Gluconate Cloth  6 each Topical Daily   cholecalciferol   1,000 Units Oral Daily   enoxaparin  (LOVENOX ) injection  30 mg Subcutaneous Q24H   famotidine   20 mg Oral BID   feeding supplement  237 mL Oral TID BM   fluticasone   2 spray Each Nare Daily   folic acid   1 mg Oral Daily   guaiFENesin   5 mL Oral QID   hydrOXYzine   10 mg Oral TID   metoprolol  succinate  25 mg Oral Daily   multivitamin with minerals  1 tablet Oral Daily   nicotine   14 mg Transdermal Daily   pneumococcal 20-valent conjugate vaccine  0.5 mL Intramuscular Tomorrow-1000   predniSONE   40 mg Oral Q breakfast   sodium chloride  flush  10-40 mL Intracatheter Q12H   thiamine   100 mg Oral Daily   Continuous Infusions:  PRN Meds: acetaminophen , fentaNYL  (SUBLIMAZE ) injection, LORazepam , mouth rinse, mouth rinse, polyethylene glycol, sodium chloride  flush, white petrolatum   Allergies:   Allergies[1]  Social History:   Social History   Socioeconomic History   Marital status: Single    Spouse name: Not on file   Number of children: Not on file   Years of education: Not on file   Highest education  level: Not on file  Occupational History   Not on file  Tobacco Use   Smoking status: Every Day    Current packs/day: 0.50    Average packs/day: 0.5 packs/day for 14.0 years (7.0 ttl pk-yrs)    Types: Cigarettes    Passive exposure: Current   Smokeless tobacco: Never  Vaping Use   Vaping status: Former  Substance and Sexual Activity   Alcohol  use: Yes    Alcohol /week: 21.0 standard drinks of alcohol     Types: 21 Cans of beer per week    Comment: previously 4-5 beers daily but now pregame one daily (equivalent of four shots daily) as of 03/23/24   Drug use: Not Currently    Comment: clean for 5 years   Sexual activity: Yes    Birth control/protection: Condom  Other Topics Concern   Not on file  Social History Narrative   Not on file   Social Drivers of Health   Tobacco Use: High Risk (11/22/2024)   Patient History    Smoking Tobacco Use: Every Day    Smokeless Tobacco Use: Never    Passive Exposure: Current  Financial Resource Strain: Not on file  Food Insecurity: Patient Unable To Answer (11/13/2024)   Epic    Worried About Programme Researcher, Broadcasting/film/video in the Last Year: Patient unable to answer    Ran Out of Food in the Last Year: Patient unable to answer  Transportation Needs: Patient Unable To Answer (11/13/2024)   Epic    Lack of Transportation (Medical): Patient unable to answer    Lack of Transportation (Non-Medical): Patient unable to answer  Physical Activity: Not on file  Stress: Not on file  Social Connections: Unknown (10/23/2024)   Social Connection and Isolation Panel    Frequency of Communication with Friends and Family: Three times a week    Frequency of Social Gatherings with Friends and Family: Three times a week    Attends Religious Services: Patient unable to answer    Active Member of Clubs or Organizations: Patient unable to answer    Attends Club or Organization Meetings: Patient unable to answer    Marital Status: Never married  Intimate Partner  Violence: Patient Unable To Answer (11/13/2024)   Epic    Fear of Current or Ex-Partner: Patient unable to answer    Emotionally Abused: Patient unable to answer    Physically Abused: Patient unable to answer    Sexually Abused: Patient unable to answer  Depression (PHQ2-9): Not on file  Alcohol  Screen: Not on file  Housing: Unknown (11/13/2024)   Epic    Unable to Pay for Housing in the Last Year: Patient unable to answer    Number of Times Moved in the Last Year: Not on file    Homeless in the Last Year: Not on file  Utilities: Patient Unable To Answer (11/13/2024)   Epic    Threatened with loss of utilities: Patient unable to answer  Health Literacy: Not on file    Family History:    Family History  Problem Relation Age of Onset   Hypertension Father      ROS:  Please see the history of present illness.   All other ROS reviewed and negative.     Physical Exam/Data: Vitals:   11/26/24 0413 11/26/24 0500 11/26/24 0745 11/26/24 1133  BP: 121/80  107/74 124/86  Pulse: 87  86 97  Resp: 20  20 20   Temp: 98 F (36.7 C)  98 F (36.7 C) 98 F (36.7 C)  TempSrc: Oral  Oral Oral  SpO2: 98%  99% 97%  Weight:  54.5 kg    Height:        Intake/Output Summary (Last 24 hours) at 11/26/2024 1407 Last data filed at 11/25/2024 1746 Gross per 24 hour  Intake 240 ml  Output --  Net 240 ml      11/26/2024    5:00 AM 11/25/2024    5:00 AM 11/24/2024    4:37 AM  Last 3 Weights  Weight (lbs) 120 lb 2.4 oz 113 lb 8.6 oz 115 lb 15.4 oz  Weight (kg) 54.5 kg 51.5 kg 52.6 kg     Body mass index is 22.7 kg/m.  General:  Chronically ill appearing young male HEENT: normal Neck: no JVD Vascular: No carotid bruits; Distal pulses 2+ bilaterally Cardiac:  normal S1, S2; RRR; no murmur  Lungs:  clear to auscultation bilaterally, no wheezing, rhonchi or rales  Abd: soft, nontender, no hepatomegaly  Ext: no edema Musculoskeletal:  No deformities, BUE and BLE strength normal and  equal Skin: warm and dry  Neuro: no focal abnormalities noted Psych:  Normal affect   EKG:  The EKG was personally reviewed and demonstrates:  12/1 sinus rhythm, 86 bpm, prolonged QTc with peaked T waves Telemetry:  Telemetry was personally reviewed and demonstrates:  Sinus Rhythm-tachy at times, episode of SVT around 0945 up into the 150s, then return to sinus tachycardia  Relevant CV Studies:  Echo: 11/13/2024  IMPRESSIONS     1. Left ventricular ejection fraction, by estimation, is 55 to 60%. Left  ventricular ejection fraction by 2D MOD biplane is 61.1 %. The left  ventricle has normal function. The left ventricle has no regional wall  motion abnormalities. Left ventricular  diastolic parameters were normal.   2. Right ventricular systolic function is normal. The right ventricular  size is normal. Tricuspid regurgitation signal is inadequate for assessing  PA pressure.   3. The mitral valve is normal in structure. Trivial mitral valve  regurgitation. No evidence of mitral stenosis.   4. The aortic valve is tricuspid. Aortic valve regurgitation is not  visualized. No aortic stenosis is present.   5. The inferior vena cava is normal in size with greater than 50%  respiratory variability, suggesting right atrial pressure of 3 mmHg.   Comparison(s): No prior Echocardiogram.   Conclusion(s)/Recommendation(s): Normal biventricular function without  evidence of hemodynamically significant valvular heart disease.   FINDINGS   Left Ventricle: Left ventricular ejection fraction, by estimation, is 55  to 60%. Left ventricular ejection fraction by 2D MOD biplane is 61.1 %.  The left ventricle has normal function. The left ventricle has no regional  wall motion abnormalities. The  left ventricular internal cavity size was normal in size. There is no left  ventricular hypertrophy. Left ventricular diastolic parameters were  normal.   Right Ventricle: The right ventricular size is  normal. No increase in  right ventricular wall thickness. Right ventricular systolic function is  normal. Tricuspid regurgitation signal is inadequate for assessing PA  pressure.   Left Atrium: Left atrial size was normal in size.   Right Atrium: Right atrial size was normal in size.   Pericardium: There is no evidence of pericardial effusion.   Mitral Valve: The mitral valve is normal in structure. Trivial mitral  valve regurgitation. No evidence of mitral valve stenosis.   Tricuspid Valve: The tricuspid valve is normal in structure. Tricuspid  valve regurgitation is trivial. No evidence of tricuspid stenosis.   Aortic Valve: The aortic valve is tricuspid. Aortic valve regurgitation is  not visualized. No aortic stenosis is present.   Pulmonic Valve: The pulmonic valve was normal in structure. Pulmonic valve  regurgitation is not visualized. No evidence of pulmonic stenosis.   Aorta: The aortic root and ascending aorta are structurally normal, with  no evidence of dilitation.   Venous: The right upper pulmonary vein is normal. The inferior vena cava  is normal in size with greater than 50% respiratory variability,  suggesting right atrial pressure of 3 mmHg.   IAS/Shunts: No atrial level shunt detected by color flow Doppler.    Limited echo: 11/17/2024  IMPRESSIONS     1. Left ventricular ejection fraction, by estimation, is 45 to 50%. Left  ventricular ejection fraction by 2D MOD biplane is 48.3 %. Left  ventricular ejection fraction by PLAX is 51 %. The left ventricle has  mildly decreased function.   2. Right ventricular systolic function is normal. Tricuspid regurgitation  signal is inadequate for assessing PA pressure.   3. Trivial mitral valve regurgitation. No evidence of mitral stenosis.  Comparison(s): The left ventricular function slightly worse.    Laboratory Data: High Sensitivity Troponin:  No results for input(s): TROPONINIHS in the last 720 hours.  No results for input(s): TRNPT in the last 720 hours.    Chemistry Recent Labs  Lab 11/21/24 0430 11/22/24 0403 11/23/24 0823 11/24/24 1008 11/25/24 2243  NA 135 137 136 131* 135  K 4.0 4.3 4.1 4.3 3.9  CL 101 98 100 100 104  CO2 29 29 30 26  21*  GLUCOSE 122* 128* 132* 116* 106*  BUN 28* 25* 21* 17 16  CREATININE 0.87 0.97 0.82 0.81 0.83  CALCIUM  7.9* 8.6* 8.3* 7.9* 8.3*  MG 2.1 2.1 2.0  --   --   GFRNONAA >60 >60 >60 >60 >60  ANIONGAP 5 10 6 5 10     Recent Labs  Lab 11/25/24 2243  PROT 6.3*  ALBUMIN  1.9*  AST 81*  ALT 50*  ALKPHOS 102  BILITOT 1.7*   Lipids  Recent Labs  Lab 11/26/24 0537  TRIG 63    Hematology Recent Labs  Lab 11/22/24 0403 11/23/24 0823 11/25/24 2243  WBC 12.4* 10.2 12.3*  RBC 2.30* 2.26* 2.42*  HGB 8.3* 8.2* 8.7*  HCT 24.3* 24.2* 26.0*  MCV 105.7* 107.1* 107.4*  MCH 36.1* 36.3* 36.0*  MCHC 34.2 33.9 33.5  RDW 13.6 13.8 14.2  PLT 56* 78* 125*   Thyroid No results for input(s): TSH, FREET4 in the last 168 hours.  BNPNo results for input(s): BNP, PROBNP in the last 168 hours.  DDimer No results for input(s): DDIMER in the last 168 hours.  Radiology/Studies:  DG Swallowing Func-Speech Pathology Result Date: 11/23/2024 Table formatting from the original result was not included. Modified Barium Swallow Study Patient Details Name: Vishruth Seoane MRN: [id number] Date of Birth: 07-14-1986 Today's Date: 11/23/2024 HPI/PMH: HPI: Wrigley Plasencia is a 85 yom who presented to [facility] 11/20 with AMS presumed 2/2 to polysubstance abuse and possible withdrawal. Intubated for airway protection 11/20-11/25; reintubated 11/27 and [facility]; self-extubated 11/29. Reintubation 12/1-12/6. Dx ARDS multilobar pna, HFrEF, cirrhosis (out of withdrawal window 12/7); cortrak 12/4. PMHx alcohol  abuse, alcoholic liver cirrhosis, chronic pancytopenia, opioid dependence on Methadone, hx L femur fx ORIF 12/2021, R fibula fx intramed nailing  12/2021. Clinical Impression: Pt presents with functional swallowing with reliable airway protection on all but one instance when drinking thin liquids.  During single episode of trace aspiration, material spilled into the larynx during sequential bolus consumption of thins. Aspiration elicited a cough response.  During all other swallows across consistencies he demonstrated normal chewing, bolus propulsion, pharyngeal contraction and UES patency. Recommend initiating a regular diet with thin liquids; may take meds whole with water . SLP will follow briefly to ensure toleration. DIGEST Swallow Severity Rating*  Safety: 1  Efficiency: 0  Overall Pharyngeal Swallow Severity: 1 1: mild; 2: moderate; 3: severe; 4: profound *The Dynamic Imaging Grade of Swallowing Toxicity is standardized for the head and neck cancer population, however, demonstrates promising clinical applications across populations to standardize the clinical rating of pharyngeal swallow safety and severity. Factors that may increase risk of adverse event in presence of aspiration Noe & Lianne 2021): Factors that may increase risk of adverse event in presence of aspiration Noe & Lianne 2021): Weak cough; Poor general health and/or compromised immunity Recommendations/Plan: Swallowing Evaluation Recommendations Swallowing Evaluation Recommendations Recommendations: PO diet PO Diet Recommendation: Regular; Thin liquids (Level 0) Liquid Administration via: Cup; Straw Medication Administration: Whole meds with liquid Supervision: Patient able  to self-feed Swallowing strategies  : Small bites/sips Oral care recommendations: Oral care BID (2x/day) Treatment Plan Treatment Plan Treatment recommendations: Therapy as outlined in treatment plan below Follow-up recommendations: No SLP follow up Functional status assessment: Patient has had a recent decline in their functional status and demonstrates the ability to make significant improvements in function  in a reasonable and predictable amount of time. Treatment frequency: Min 1x/week Treatment duration: 1 week Interventions: Aspiration precaution training Recommendations Recommendations for follow up therapy are one component of a multi-disciplinary discharge planning process, led by the attending physician.  Recommendations may be updated based on patient status, additional functional criteria and insurance authorization. Assessment: Orofacial Exam: Orofacial Exam Oral Cavity: Oral Hygiene: WFL Oral Cavity - Dentition: Missing dentition; Poor condition Orofacial Anatomy: WFL Oral Motor/Sensory Function: WFL Anatomy: Anatomy: WFL Boluses Administered: Boluses Administered Boluses Administered: Thin liquids (Level 0); Mildly thick liquids (Level 2, nectar thick); Puree; Solid  Oral Impairment Domain: Oral Impairment Domain Lip Closure: No labial escape Tongue control during bolus hold: Cohesive bolus between tongue to palatal seal Bolus preparation/mastication: Timely and efficient chewing and mashing Bolus transport/lingual motion: Brisk tongue motion Oral residue: Complete oral clearance Location of oral residue : N/A Initiation of pharyngeal swallow : Pyriform sinuses  Pharyngeal Impairment Domain: Pharyngeal Impairment Domain Soft palate elevation: No bolus between soft palate (SP)/pharyngeal wall (PW) Laryngeal elevation: Complete superior movement of thyroid cartilage with complete approximation of arytenoids to epiglottic petiole Anterior hyoid excursion: Complete anterior movement Epiglottic movement: Complete inversion Laryngeal vestibule closure: Complete, no air/contrast in laryngeal vestibule Pharyngeal stripping wave : Present - complete Pharyngeal contraction (A/P view only): N/A Pharyngoesophageal segment opening: Complete distension and complete duration, no obstruction of flow Tongue base retraction: No contrast between tongue base and posterior pharyngeal wall (PPW) Pharyngeal residue: Complete  pharyngeal clearance  Esophageal Impairment Domain: Esophageal Impairment Domain Esophageal clearance upright position: Complete clearance, esophageal coating Pill: Pill Consistency administered: Thin liquids (Level 0) Thin liquids (Level 0): [facility] Penetration/Aspiration Scale Score: Penetration/Aspiration Scale Score 1.  Material does not enter airway: Mildly thick liquids (Level 2, nectar thick); Moderately thick liquids (Level 3, honey thick); Puree; Solid; Pill 7.  Material enters airway, passes BELOW cords and not ejected out despite cough attempt by patient: Thin liquids (Level 0) Compensatory Strategies: Compensatory Strategies Compensatory strategies: No   General Information: Caregiver present: No  Diet Prior to this Study: NPO; Cortrak/Small bore NG tube   Temperature : Normal   Respiratory Status: WFL   Supplemental O2: Nasal cannula   History of Recent Intubation: Yes  Behavior/Cognition: Alert; Cooperative Self-Feeding Abilities: Able to self-feed Baseline vocal quality/speech: Hypophonia/low volume Volitional Cough: Able to elicit Volitional Swallow: Unable to elicit Exam Limitations: No limitations Goal Planning: Prognosis for improved oropharyngeal function: Good No data recorded No data recorded No data recorded Consulted and agree with results and recommendations: Patient Pain: Pain Assessment Pain Assessment: No/denies pain Faces Pain Scale: 6 Pain Location: chest Pain Descriptors / Indicators: Discomfort; Sharp Pain Intervention(s): Monitored during session; Limited activity within patient's tolerance; Repositioned End of Session: Start Time:SLP Start Time (ACUTE ONLY): 1237 Stop Time: SLP Stop Time (ACUTE ONLY): 1250 Time Calculation:SLP Time Calculation (min) (ACUTE ONLY): 13 min Charges: SLP Evaluations $ SLP Speech Visit: 1 Visit SLP Evaluations $BSS Swallow: 1 Procedure $MBS Swallow: 1 Procedure $Swallowing Treatment: 1 Procedure SLP visit diagnosis: SLP Visit Diagnosis: Dysphagia, pharyngeal  phase (R13.13) Past Medical History: Past Medical History: Diagnosis Date  Alcohol  withdrawal (HCC) 03/01/2024  Alcoholic  cirrhosis of liver with ascites (HCC) 03/02/2024  Alcoholic gastritis 03/03/2024  Alcoholic hepatitis with ascites (HCC) 03/02/2024  Brain injury (HCC)   Cirrhosis with alcoholism (HCC)   Critical polytrauma 12/17/2021  Decompensated cirrhosis (HCC) 03/10/2024  Depression   Elevated AFP 03/03/2024  HCV antibody positive 03/03/2024  Insomnia   Macrocytic anemia 03/03/2024  Malnutrition of moderate degree 12/21/2021  Panic attack   Severe thrombocytopenia 03/03/2024  Splenic rupture 09/07/2023 Past Surgical History: Past Surgical History: Procedure Laterality Date  ABDOMINAL SURGERY    CLOSED REDUCTION FINGER WITH PERCUTANEOUS PINNING Left 02/04/2024  Procedure: LEFT THUMB CLOSED REDUCTION FINGER WITH PERCUTANEOUS PINNing;  Surgeon: Arlinda Buster, MD;  Location: [facility];  Service: Orthopedics;  Laterality: Left;  DISTAL FEMUR BONE BRIDGE EXCISION Left   FEMUR CLOSED REDUCTION Left 12/17/2021  Procedure: CLOSED REDUCTION FEMORAL SHAFT;  Surgeon: Barton Drape, MD;  Location: [facility];  Service: Orthopedics;  Laterality: Left;  FOREARM SURGERY Left   x4-metal rods placed  I & D EXTREMITY Left 02/04/2024  Procedure: IRRIGATION AND DEBRIDEMENT OF LEFT THUMB;  Surgeon: Arlinda Buster, MD;  Location: [facility];  Service: Orthopedics;  Laterality: Left;  IR PARACENTESIS  03/02/2024  LEG SURGERY Right 2023  NAILBED REPAIR Left 02/04/2024  Procedure: LEFT THUMB NAILBED REPAIR;  Surgeon: Arlinda Buster, MD;  Location: [facility];  Service: Orthopedics;  Laterality: Left;  ORIF FEMUR FRACTURE Left 12/20/2021  Procedure: OPEN REDUCTION INTERNAL FIXATION (ORIF) DISTAL FEMUR FRACTURE;  Surgeon: Kendal Franky SQUIBB, MD;  Location: [facility];  Service: Orthopedics;  Laterality: Left;  TIBIA IM NAIL INSERTION Bilateral 12/17/2021  Procedure: RIGHT INTRAMEDULLARY (IM)  NAIL TIBIAL; RIGHT CLOSED TREATMENT OF FIBULA FRACTURE; LEFT CLOSED REDUCTION SPLINTING OF PERIPROSTHETIC  SUPRACONDYLAR FEMUR FRACTURE;  Surgeon: Barton Drape, MD;  Location: [facility];  Service: Orthopedics;  Laterality: Bilateral;  WRIST ARTHROSCOPY WITH CARPOMETACARPEL [facility]) ARTHROPLASTY Left 03/21/2022  Procedure: left wrist arthroscopy with debridement as needed;  Surgeon: Sissy Cough, MD;  Location: [facility];  Service: Orthopedics;  Laterality: Left;  just needs 60 mins for surgery  WRIST SURGERY Right   pins [name] 11/23/2024, 1:42 PM  CT SINUS WO CONTRAST Result Date: 11/22/2024 EXAM: CT Sinus without contrast 11/22/2024 03:35:14 PM TECHNIQUE: CT sinuses without contrast. Dose reduction techniques were achieved by using automated exposure control and/or adjustment of mA and/or kV according to patient size and/or use of iterative reconstruction technique. COMPARISON: Gregory Murphy available CLINICAL HISTORY: [id number] Hearing loss [id number] Hearing loss FINDINGS: FRONTAL: Clear. Patent frontoethmoidal recesses. MAXILLARY: Mild right maxillary sinus mucosal thickening. Otherwise, clear. Patent ostiomeatal complexes. ETHMOID: Clear. The ethmoid roofs are symmetric. No pneumatization superior to the anterior ethmoid notches. The lamina papyracea are intact. The optic nerve canals are intact. SPHENOID: Bilateral sphenoid sinus mucosal thickening and air-fluid levels. Opacified right sphenoethmoidal recess. NASAL CAVITY: Patent. Leftward nasal septal deviation with bony spur. OTHER: Bilateral middle ear fluid and large mastoid effusions. No acute soft tissue abnormality. IMPRESSION: 1. Bilateral sphenoid sinus mucosal thickening and air-fluid levels. 2. Bilateral middle ear fluid and large mastoid effusions. Correlate with signs/symptoms of otomastoiditis. 3. Leftward nasal septal deviation with bony spur. Electronically signed by: Gilmore Molt MD 11/22/2024 08:21 PM EST RP  Workstation: [id number]     Assessment and Plan:  Leandre Wien is a 38 y.o. male with a hx of alcohol  abuse, alcoholic liver cirrhosis, pancytopenia who is being seen 11/26/2024 for the evaluation of abnormal echo/SVT at the request of Dr. Carlota.  HFmrEF -- Echocardiogram 11/28 with LVEF of 55 to 60%, no regional wall motion abnormality, normal RV, trivial MR. -- Repeat limited echo 12/2 with LVEF of 45 to 50%, normal RV -- suspect NICM in the setting of heavy ETOH use. Discussed the need for cessation. He plans to attend rehab at discharge  -- now on Toprol  XL, add losartan 12.5mg  daily   SVT -- episode this morning working with PT with rates into the 150s. -- In the setting of ETOH abuse/acute illness  -- K+ 3.9, Mg 2 -- was started on Toprol  XL 25mg  daily this afternoon  Per primary Acute respiratory failure secondary to MRSA pneumonia Altered mental status secondary to alcohol  withdrawal Acute hepatic encephalopathy EtOH abuse Hyponatremia Chronic thrombocytopenia  For questions or updates, please contact [facility] Please consult [email or web address] for contact info under   Signed, Manuelita Rummer, NP  11/26/2024 2:07 PM  Patient seen and examined, note reviewed with the signed Advanced Practice Provider. I personally reviewed laboratory data, imaging studies and relevant notes. I independently examined the patient and formulated the important aspects of the plan. I have personally discussed the plan with the patient and/or family. Comments or changes to the note/plan are indicated below.  HPI: This is a 38 year old male with past medical history of alcohol  abuse with alcoholic cirrhosis, polysubstance abuse on methadone, pancytopenia, recent alcohol  withdrawal episode requiring intubation for airway protection.  Cardiology was consulted for SVT and an episode of nonsustained VT.  Today, the patient states that he did feel palpitations earlier today likely  during the SVT episode.  Denies any history of cardiac issues or any family history of sudden cardiac death.  No other complaints at this time.   My Exam:  Physical Exam Constitutional:      Comments: Frail Appears older than stated age  Cardiovascular:     Rate and Rhythm: Normal rate and regular rhythm.     Heart sounds: No murmur heard. Pulmonary:     Effort: Pulmonary effort is normal.  Musculoskeletal:        General: No swelling.  Neurological:     Mental Status: Mental status is at baseline.      Telemetry: Sinus rhythm with an episode of SVT, likely atrial tachycardia- Personally reviewed   Assessment & Plan:  SVT, likely atrial tachycardia-no EKG available.  Likely reactive to acute illness.  Electrolytes not checked today.  Agree with metoprolol  succinate 25 mg.  Continue telemetry.  Continue to treat underlying abnormalities HFmr EF, not in acute exacerbation and likely stress-induced cardiomyopathy started on metoprolol  succinate and losartan as above Acute respiratory failure secondary to MRSA pneumonia AMS secondary to alcohol  withdrawal Acute hepatic encephalopathy Ethanol abuse Hyponatremia Chronic thrombocytopenia Nonsustained VT metoprolol  as above  No further recommendations at this time.  Will arrange for follow-up in 4 to 6 weeks with the APP.  Cardiology will sign off.  Please not hesitate to reach out with any further questions  Signed, Emeline Calender, DO [place]  [facility]  11/26/2024 3:09 PM        [1]  Allergies Allergen Reactions   Latex Itching    Gloves

## 2024-11-26 NOTE — Progress Notes (Addendum)
 Consultation Progress Note   Patient: Gregory Murphy FMW:[id number] DOB: 03-30-1986 DOA: 11/12/2024 DOS: the patient was seen and examined on 11/26/2024 Primary service: Marios Gaiser, Landon BRAVO, MD  Brief hospital course:  38 yo M w/ pertinent PMH alcohol  abuse, alcoholic liver cirrhosis, chronic pancytopenia presents to [facility] on 11/27 with ards.   Patient has prior traumatic injury to left femur requiring rod placement from left hip to knee.  Has had prior left knee effusion requiring arthrocentesis back in July 2025.  Patient recently admitted on 11/7-11/14 for left knee effusion requiring arthrocentesis.  While in hospital patient became encephalopathic likely alcohol  withdrawal requiring brief Precedex , Librium  taper, CIWA protocol.   Patient recently started on methadone 3 days prior to this admit.  Patient admitted to [facility] on 11/20 with AMS presumed 2/2 to polysubstance abuse and possible withdrawal. Intbubated for airway protection.  Patient required intubation.  CT chest with b/l peripheral and perivascular groundglass . Ct head no acute abnormality. Patient was extubated on 11/25. While in hospital worsening withdrawal despite ativan  and phenobarb, started on precedex . Developed respiratory failure and reintubated on 11/27.  Patient with increasing O2 requirements and ARDS.     On 11/27 transferred to [facility] for fever, resp failure, possible intentional methadone OD, PNA, AKI, and encephalopathy due to HE and DT.   Transferred to [facility] on 12/8 Feeding tube removed 12/8  Assessment and Plan: Acute respiratory failure due to MRSA pneumonia - requiring re-intubation on 11/16/2024 extubated 11/20/2024 - doing well post extubation - speech evaluation. Continue TF for now - completed 10 days of  vancomycin   AMS secondary to alcohol  withdrawal and acute hepatic encephalopathy  -Status improved, close to baseline. Status post 2 intubations this admission (initially at [facility],  transferred to [facility] on 11/27) - Normal ammonia levels. Dc lactulose , patient now with diarrhea  Polysubstance Abuse Suspected overdose -Presented with AMS presumed due  to polysubstance abuse and possible withdrawal. Intbubated for airway protection. Psych consulted for evaluation.   Arrhythmias - new systolic dysfunction, 45-50% Episodes of SVT up to 160.He had  3 beat run of V-tach today  Start metoprolol  25mg  daily. Replace electrolytes-K>4, Mg>2, Check electrolytes if low replace   Continue telemetry   Alcohol  abuse hx Alcoholic liver disease Prior history of hep C Chronic thrombocytopenia-proving, monitor closely. Currently at 125. History of left femur fracture requiring ORIF  Hx of recurrent hemarthrosis of left knee   Hyponatremia-resolved 135, encouraged p.o. intake. Monitor trend.  - Hearing loss  reports hearing loss, he did have significant upper airways and sinus secretions  CT sinus  1. Bilateral sphenoid sinus mucosal thickening and air-fluid levels. 2. Bilateral middle ear fluid and large mastoid effusions. Correlate with signs/symptoms of otomastoiditis. 3. Leftward nasal septal deviation with bony spur.   D/w ENT, Outpatient evaluation for hearing loss, will treat likely otomastoiditis with Augmentin , continue flonase  and follow clinical course   Hold lasix .        [facility] will continue to follow the patient.  Subjective: Patient seen at bedside this morning , he reports feeling well, no complaints, he denies nausea, vomiting, chest pain, cough. Reports improvement with his  hearing.  Physical Exam: Vitals:   11/26/24 0040 11/26/24 0413 11/26/24 0500 11/26/24 0745  BP: 116/84 121/80  107/74  Pulse: 87 87  86  Resp: 20 20  20   Temp: 99 F (37.2 C) 98 F (36.7 C)  98 F (36.7 C)  TempSrc: Oral Oral  Oral  SpO2: 96%  98%  99%  Weight:   54.5 kg   Height:       General No acute distress Eyes: PERRL, lids and conjunctivae normal ENMT:  NG tube in place, Mucous membranes are moist.   Neck: normal, supple, no masses, no thyromegaly Respiratory: clear to auscultation bilaterally, no wheezing, no crackles. Normal respiratory effort. No accessory muscle use.  Cardiovascular: Regular rate and rhythm, no murmurs / rubs / gallops Abdomen: Soft, nontender nondistended. Musculoskeletal: no clubbing / cyanosis. No joint deformity upper and lower extremities.  Skin: no rashes, lesions, ulcers. No induration Neurologic: Facial asymmetry, moving extremity spontaneously, speech fluent. Psychiatric: Normal judgment and insight. Alert and oriented x 3. Normal mood.   Data Reviewed:    CBC    Component Value Date/Time   WBC 12.3 (H) 11/25/2024 2243   RBC 2.42 (L) 11/25/2024 2243   HGB 8.7 (L) 11/25/2024 2243   HCT 26.0 (L) 11/25/2024 2243   PLT 125 (L) 11/25/2024 2243   MCV 107.4 (H) 11/25/2024 2243   MCH 36.0 (H) 11/25/2024 2243   MCHC 33.5 11/25/2024 2243   RDW 14.2 11/25/2024 2243   LYMPHSABS 1.2 10/23/2024 0344   MONOABS 0.3 10/23/2024 0344   EOSABS 0.0 10/23/2024 0344   BASOSABS 0.1 10/23/2024 0344   CMP     Component Value Date/Time   NA 135 11/25/2024 2243   K 3.9 11/25/2024 2243   CL 104 11/25/2024 2243   CO2 21 (L) 11/25/2024 2243   GLUCOSE 106 (H) 11/25/2024 2243   BUN 16 11/25/2024 2243   CREATININE 0.83 11/25/2024 2243   CALCIUM  8.3 (L) 11/25/2024 2243   PROT 6.3 (L) 11/25/2024 2243   ALBUMIN  1.9 (L) 11/25/2024 2243   AST 81 (H) 11/25/2024 2243   ALT 50 (H) 11/25/2024 2243   ALKPHOS 102 11/25/2024 2243   BILITOT 1.7 (H) 11/25/2024 2243   GFRNONAA >60 11/25/2024 2243    Family Communication: Family at bedside, questions answered Disposition- DC Home once cleared by Psych and Cardiology. Time spent: 35 minutes.  Author: Landon FORBES Baller, MD 11/26/2024 11:06 AM  For on call review [email or web address].

## 2024-11-27 ENCOUNTER — Other Ambulatory Visit (HOSPITAL_COMMUNITY): Payer: Self-pay

## 2024-11-27 DIAGNOSIS — J9601 Acute respiratory failure with hypoxia: Secondary | ICD-10-CM | POA: Diagnosis not present

## 2024-11-27 MED ORDER — ACETAMINOPHEN 325 MG PO TABS: 650.0000 mg | ORAL_TABLET | Freq: Three times a day (TID) | ORAL | Status: AC | PRN

## 2024-11-27 MED ORDER — HYDROXYZINE HCL 10 MG PO TABS
10.0000 mg | ORAL_TABLET | Freq: Three times a day (TID) | ORAL | 0 refills | Status: AC
  Filled 2024-11-27: qty 30, 10d supply, fill #0

## 2024-11-27 MED ORDER — LOSARTAN POTASSIUM 25 MG PO TABS
12.5000 mg | ORAL_TABLET | Freq: Every day | ORAL | 0 refills | Status: AC
  Filled 2024-11-27: qty 30, 60d supply, fill #0

## 2024-11-27 MED ORDER — METOPROLOL SUCCINATE ER 25 MG PO TB24
25.0000 mg | ORAL_TABLET | Freq: Every day | ORAL | 3 refills | Status: AC
  Filled 2024-11-27: qty 30, 30d supply, fill #0

## 2024-11-27 MED ORDER — AMOXICILLIN-POT CLAVULANATE 875-125 MG PO TABS
1.0000 | ORAL_TABLET | Freq: Two times a day (BID) | ORAL | 0 refills | Status: AC
  Filled 2024-11-27: qty 4, 2d supply, fill #0

## 2024-11-27 MED ADMIN — Losartan Potassium Tab 25 MG: 12.5 mg | ORAL | NDC 68180037609

## 2024-11-27 MED ADMIN — Metoprolol Succinate Tab ER 24HR 25 MG (Tartrate Equiv): 25 mg | ORAL | NDC 72516003001

## 2024-11-27 NOTE — TOC Transition Note (Signed)
 Transition of Care [facility]) - Discharge Note   Patient Details  Name: Gregory Murphy MRN: [id number] Date of Birth: 12/01/1986  [facility] CM/SW Contact:  Rosaline JONELLE Joe, RN Phone Number: 11/27/2024, 1:01 PM   Clinical Narrative:    CM spoke to patient at the bedside and he plans to return home to his grandfather's home in [place], [place].  Patient states that he has RW, Cane, Bedside commode and WC at the home.  OP Rehabilitation referral placed for Outpatient therapy through [facility] rehabilitation location.  MD asked to co-sign the OP orders.  PICC line is currently being removed.  Patient plans to call his grandfather to provide transportation to home by car.     Barriers to Discharge: Continued Medical Work up   Patient Goals and CMS Choice            Discharge Placement                       Discharge Plan and Services Additional resources added to the After Visit Summary for                                       Social Drivers of Health (SDOH) Interventions SDOH Screenings   Food Insecurity: Patient Unable To Answer (11/13/2024)  Housing: Unknown (11/13/2024)  Transportation Needs: Patient Unable To Answer (11/13/2024)  Utilities: Patient Unable To Answer (11/13/2024)  Social Connections: Unknown (10/23/2024)  Tobacco Use: High Risk (11/22/2024)     Readmission Risk Interventions    10/26/2024   11:33 AM 03/10/2024    5:12 PM  Readmission Risk Prevention Plan  Transportation Screening Complete Complete  PCP or Specialist Appt within 5-7 Days  --  PCP or Specialist Appt within 3-5 Days Complete   Home Care Screening  Complete  Medication Review (RN CM)  Complete  HRI or Home Care Consult Complete   Social Work Consult for Recovery Care Planning/Counseling Complete   Palliative Care Screening Not Applicable   Medication Review Oceanographer) Complete

## 2024-11-27 NOTE — Plan of Care (Signed)

## 2024-11-27 NOTE — Discharge Summary (Signed)
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 Decreased interstitial opacities, suggesting improving pulmonary edema. Electronically signed by: Waddell Calk MD 11/17/2024 08:35 AM EST RP Workstation: [id number]   DG CHEST PORT 1 VIEW Result Date: 11/16/2024 CLINICAL DATA:  Intubated. EXAM: PORTABLE CHEST 1  VIEW COMPARISON:  Earlier today. FINDINGS: Interval endotracheal tube with its tip 3 cm above the carina. Recommend retracting 2 cm for better positioning. Right PICC tip in the region of the superior cavoatrial junction. Feeding tube extending into the stomach with its tip not included. Normal-sized heart. Bilateral airspace opacity with significant improvement. Minimal right pleural effusion. Unremarkable bones. IMPRESSION: 1. Interval endotracheal tube with its tip 3 cm above the carina. Recommend retracting 2 cm for better positioning. 2. Significantly improved bilateral airspace opacity. The significant improvement over a short period of time favors alveolar edema over pneumonia. 3. Minimal right pleural effusion. Electronically Signed   By:  Elspeth Bathe M.D.   On: 11/16/2024 14:36   DG CHEST PORT 1 VIEW Result Date: 11/16/2024 CLINICAL DATA:  Shortness of breath. EXAM: PORTABLE CHEST 1 VIEW COMPARISON:  11/12/2024 FINDINGS: The endotracheal and nasogastric tubes have been removed. The right PICC tip is in the upper right atrium approximately 1 cm inferior to the superior cavoatrial junction. Extensive bilateral patchy airspace opacity, increased. Possible minimal bilateral pleural fluid. Unremarkable bones. IMPRESSION: 1. Marked bilateral multi lobar pneumonia, increased. 2. Possible minimal bilateral pleural fluid. 3. Right PICC tip in the upper right atrium approximately 1 cm inferior to the superior cavoatrial junction. Electronically Signed   By: Elspeth Bathe M.D.   On: 11/16/2024 14:34   DG Abd Portable 1V Result Date: 11/16/2024 EXAM: 1 VIEW XRAY OF THE ABDOMEN 11/16/2024 10:24:00 AM COMPARISON: chest radiograph from 11/12/2024 . CLINICAL HISTORY: Encounter for feeding tube placement FINDINGS: LINES, TUBES AND DEVICES: Weighted feeding.  tube in place with tip overlying mid stomach. BOWEL: Nonobstructive bowel gas pattern. SOFT TISSUES: Right upper quadrant gallstones. BONES: No acute osseous abnormality. LUNGS: Bibasilar heterogeneous lung opacities. IMPRESSION: 1. Weighted tip enteric tube appropriately positioned with the tip overlying the mid stomach. 2. Bibasilar lung opacities. Electronically signed by: Waddell Calk MD 11/16/2024 11:03 AM EST RP Workstation: [id number]   ECHOCARDIOGRAM COMPLETE Result Date: 11/13/2024    ECHOCARDIOGRAM REPORT   Patient Name:   TAYDON NASWORTHY [facility] Date of Exam: 11/13/2024 Medical Rec #:  [id number]            Height:       61.0 in Accession #:    [id number]           Weight:       130.5 lb Date of Birth:  1986/11/27           BSA:          1.575 m Patient Age:    37 years             BP:           113/66 mmHg Patient Gender: M                    HR:           84 bpm. Exam Location:  Inpatient  Procedure: 2D Echo, Cardiac Doppler and Color Doppler (Both Spectral and Color            Flow Doppler were utilized during procedure). Indications:    Acute respiratory distress  History:        Patient has no prior history of Echocardiogram examinations.                 Signs/Symptoms:Dyspnea and Shortness of Breath; Risk                 Factors:Current Smoker. ETOH.  Substance abuse.  Sonographer:    Ellouise Mose RDCS Referring Phys: [id number] OMAR CHRISTELLA PLY  Sonographer Comments: Echo performed with patient supine and on artificial respirator and suboptimal parasternal window. Image acquisition challenging due to respiratory motion. Patient on vent and on right side of bed. IMPRESSIONS  1. Left ventricular ejection fraction, by estimation, is 55 to 60%. Left ventricular ejection fraction by 2D MOD biplane is 61.1 %. The left ventricle has normal function. The left ventricle has no regional wall motion abnormalities. Left ventricular diastolic parameters were normal.  2. Right ventricular systolic function is normal. The right ventricular size is normal. Tricuspid regurgitation signal is inadequate for assessing PA pressure.  3. The mitral valve is normal in structure. Trivial mitral valve regurgitation. No evidence of mitral stenosis.  4. The aortic valve is tricuspid. Aortic valve regurgitation is not visualized. No aortic stenosis is present.  5. The inferior vena cava is normal in size with greater than 50% respiratory variability, suggesting right atrial pressure of 3 mmHg. Comparison(s): No prior Echocardiogram. Conclusion(s)/Recommendation(s): Normal biventricular function without evidence of hemodynamically significant valvular heart disease. FINDINGS  Left Ventricle: Left ventricular ejection fraction, by estimation, is 55 to 60%. Left ventricular ejection fraction by 2D MOD biplane is 61.1 %. The left ventricle has normal function. The left ventricle has no regional wall motion abnormalities. The left  ventricular internal cavity size was normal in size. There is no left ventricular hypertrophy. Left ventricular diastolic parameters were normal. Right Ventricle: The right ventricular size is normal. No increase in right ventricular wall thickness. Right ventricular systolic function is normal. Tricuspid regurgitation signal is inadequate for assessing PA pressure. Left Atrium: Left atrial size was normal in size. Right Atrium: Right atrial size was normal in size. Pericardium: There is no evidence of pericardial effusion. Mitral Valve: The mitral valve is normal in structure. Trivial mitral valve regurgitation. No evidence of mitral valve stenosis. Tricuspid Valve: The tricuspid valve is normal in structure. Tricuspid valve regurgitation is trivial. No evidence of tricuspid stenosis. Aortic Valve: The aortic valve is tricuspid. Aortic valve regurgitation is not visualized. No aortic stenosis is present. Pulmonic Valve: The pulmonic valve was normal in structure. Pulmonic valve regurgitation is not visualized. No evidence of pulmonic stenosis. Aorta: The aortic root and ascending aorta are structurally normal, with no evidence of dilitation. Venous: The right upper pulmonary vein is normal. The inferior vena cava is normal in size with greater than 50% respiratory variability, suggesting right atrial pressure of 3 mmHg. IAS/Shunts: No atrial level shunt detected by color flow Doppler.  LEFT VENTRICLE PLAX 2D                        Biplane EF (MOD) LVIDd:         5.10 cm         LV Biplane EF:   Left LVIDs:         3.60 cm                          ventricular LV PW:         0.90 cm                          ejection LV IVS:        0.80 cm  fraction by LVOT diam:     2.30 cm                          2D MOD LV SV:         96                               biplane is LV SV Index:   61                               61.1 %. LVOT Area:     4.15 cm LV IVRT:       109 msec        Diastology                                 LV e' medial:    9.57 cm/s                                LV E/e' medial:  12.2 LV Volumes (MOD)               LV e' lateral:   13.70 cm/s LV vol d, MOD    105.0 ml      LV E/e' lateral: 8.5 A2C: LV vol d, MOD    110.0 ml A4C: LV vol s, MOD    35.7 ml A2C: LV vol s, MOD    48.5 ml A4C: LV SV MOD A2C:   69.3 ml LV SV MOD A4C:   110.0 ml LV SV MOD BP:    67.9 ml RIGHT VENTRICLE             IVC RV S prime:     13.20 cm/s  IVC diam: 1.90 cm TAPSE (M-mode): 2.4 cm                             PULMONARY VEINS                             Diastolic Velocity: 49.30 cm/s                             S/D Velocity:       1.20                             Systolic Velocity:  58.20 cm/s LEFT ATRIUM             Index        RIGHT ATRIUM          Index LA diam:        3.10 cm 1.97 cm/m   RA Area:     8.17 cm LA Vol (A2C):   21.2 ml 13.46 ml/m  RA Volume:   12.70 ml 8.06 ml/m LA Vol (A4C):   35.6 ml 22.60 ml/m LA Biplane Vol: 29.8 ml 18.92 ml/m  AORTIC VALVE LVOT Vmax:   126.00 cm/s LVOT Vmean:  81.400 cm/s LVOT VTI:    0.231 m  AORTA Ao Root diam:  3.50 cm Ao Asc diam:  3.10 cm MITRAL VALVE MV Area (PHT): 4.68 cm     SHUNTS MV Decel Time: 162 msec     Systemic VTI:  0.23 m MV E velocity: 116.50 cm/s  Systemic Diam: 2.30 cm MV A velocity: 118.00 cm/s MV E/A ratio:  0.99 Georganna Archer Electronically signed by Georganna Archer Signature Date/Time: 11/13/2024/12:10:57 PM    Final    DG Chest Port 1 View Result Date: 11/12/2024 EXAM: 1 VIEW XRAY OF THE CHEST 11/12/2024 09:13:00 PM COMPARISON: 03/10/2024 CLINICAL HISTORY: Endotracheally intubated. FINDINGS: LINES, TUBES AND DEVICES: Endotracheal tube is 3 cm above the carina. Right PICC line tip at the cavoatrial junction. LUNGS AND PLEURA: Bilateral airspace disease, right greater than left concerning for pneumonia. No pleural effusion. No pneumothorax. HEART AND MEDIASTINUM: No acute abnormality of the cardiac and mediastinal silhouettes. BONES AND SOFT  TISSUES: No acute osseous abnormality. IMPRESSION: 1. Bilateral airspace disease, right greater than left, concerning for pneumonia. 2. Endotracheal tube and right PICC line are appropriately positioned. Electronically signed by: Franky Crease MD 11/12/2024 09:18 PM EST RP Workstation: [id number]    Microbiology: No results found for this or any previous visit (from the past 240 hours).   Labs: Basic Metabolic Panel: Recent Labs  Lab 11/21/24 0430 11/22/24 0403 11/23/24 0823 11/24/24 1008 11/25/24 2243 11/26/24 0537  NA 135 137 136 131* 135  --   K 4.0 4.3 4.1 4.3 3.9  --   CL 101 98 100 100 104  --   CO2 29 29 30 26  21*  --   GLUCOSE 122* 128* 132* 116* 106*  --   BUN 28* 25* 21* 17 16  --   CREATININE 0.87 0.97 0.82 0.81 0.83  --   CALCIUM  7.9* 8.6* 8.3* 7.9* 8.3*  --   MG 2.1 2.1 2.0  --   --  1.7  PHOS  --   --   --   --   --  3.8   Liver Function Tests: Recent Labs  Lab 11/25/24 2243  AST 81*  ALT 50*  ALKPHOS 102  BILITOT 1.7*  PROT 6.3*  ALBUMIN  1.9*   No results for input(s): LIPASE, AMYLASE in the last 168 hours. Recent Labs  Lab 11/24/24 1008  AMMONIA 27   CBC: Recent Labs  Lab 11/21/24 0430 11/22/24 0403 11/23/24 0823 11/25/24 2243  WBC 9.2 12.4* 10.2 12.3*  HGB 8.2* 8.3* 8.2* 8.7*  HCT 25.0* 24.3* 24.2* 26.0*  MCV 108.2* 105.7* 107.1* 107.4*  PLT 49* 56* 78* 125*   Cardiac Enzymes: No results for input(s): CKTOTAL, CKMB, CKMBINDEX, TROPONINI in the last 168 hours. BNP: BNP (last 3 results) Recent Labs    03/10/24 0413 11/18/24 1132  BNP 30.0 120.3*    ProBNP (last 3 results) No results for input(s): PROBNP in the last 8760 hours.  CBG: Recent Labs  Lab 11/25/24 0033 11/25/24 0617 11/25/24 0849 11/25/24 1204 11/25/24 1612  GLUCAP 129* 151* 221* 123* 164*    Signed:  Colen Grimes MD   [facility] 11/27/2024, 12:37 PM      [1]  Allergies Allergen Reactions   Latex Itching    Gloves

## 2024-11-27 NOTE — TOC Progression Note (Signed)
 Transition of Care [facility]) - Progression Note    Patient Details  Name: Gregory Murphy MRN: [id number] Date of Birth: 1986/11/18  Transition of Care [facility]) CM/SW Contact  Sherline Clack, [place] Phone Number: 11/27/2024, 1:20 PM  Clinical Narrative:     CSW spoke with patient at bedside regarding discharge plans and substance use. Patient drinks alcohol  and does not endorse narcotic use. Per patient, patient has gone to several residential treatments and recently was receiving methadone treatment. Patient states the methadone did not sit well with him and that he will not try that treatment again. Patient has also been to AA and NA meetings, but these have not been helpful. Patient said he knows he is going to die soon if he doesn't stop drinking. Patient told CSW he got in contact with [facility] for their residential 28-day program. CSW provided patient with contact information and address of [facility] in case patient would like to share the information with someone while he is in rehab. Patient shared his start date is supposed to be 12/13.   Patient will be discharging home with family until he is able to go to [facility] for rehab. CSW will continue to follow as he prepares for discharge today.                Expected Discharge Plan and Services       Living arrangements for the past 2 months: Single Family Home Expected Discharge Date: 11/27/24                                     Social Drivers of Health (SDOH) Interventions SDOH Screenings   Food Insecurity: Patient Unable To Answer (11/13/2024)  Housing: Unknown (11/13/2024)  Transportation Needs: Patient Unable To Answer (11/13/2024)  Utilities: Patient Unable To Answer (11/13/2024)  Social Connections: Unknown (10/23/2024)  Tobacco Use: High Risk (11/22/2024)    Readmission Risk Interventions    10/26/2024   11:33 AM 03/10/2024    5:12 PM  Readmission Risk Prevention Plan  Transportation Screening  Complete Complete  PCP or Specialist Appt within 5-7 Days  --  PCP or Specialist Appt within 3-5 Days Complete   Home Care Screening  Complete  Medication Review (RN CM)  Complete  HRI or Home Care Consult Complete   Social Work Consult for Recovery Care Planning/Counseling Complete   Palliative Care Screening Not Applicable   Medication Review Oceanographer) Complete

## 2024-11-27 NOTE — Progress Notes (Signed)
 PICC removed per protocol per MD order. Manual pressure applied for 5 mins. Vaseline gauze, gauze, and Tegaderm applied over insertion site. No bleeding or swelling noted. Instructed patient to remain in bed for thirty mins. Educated patient about S/S of infection and when to call MD; no heavy lifting or pressure on right side for 24 hours; keep dressing dry and intact for 24 hours. Pt verbalized comprehension.

## 2024-12-07 LAB — CULTURE, FUNGUS WITHOUT SMEAR

## 2024-12-14 ENCOUNTER — Encounter: Payer: Self-pay | Admitting: *Deleted

## 2024-12-30 ENCOUNTER — Ambulatory Visit: Payer: MEDICAID | Admitting: Cardiology
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
